# Patient Record
Sex: Male | Born: 1965
Health system: Southern US, Community
[De-identification: ages and names within clinical notes are randomized; demographics above are authoritative.]

## PROBLEM LIST (undated history)

## (undated) DIAGNOSIS — I2510 Atherosclerotic heart disease of native coronary artery without angina pectoris: Secondary | ICD-10-CM

## (undated) DIAGNOSIS — F1920 Other psychoactive substance dependence, uncomplicated: Secondary | ICD-10-CM

## (undated) DIAGNOSIS — F32A Depression, unspecified: Secondary | ICD-10-CM

## (undated) DIAGNOSIS — I10 Essential (primary) hypertension: Secondary | ICD-10-CM

## (undated) DIAGNOSIS — K649 Unspecified hemorrhoids: Secondary | ICD-10-CM

## (undated) DIAGNOSIS — N182 Chronic kidney disease, stage 2 (mild): Secondary | ICD-10-CM

## (undated) DIAGNOSIS — I77810 Thoracic aortic ectasia: Secondary | ICD-10-CM

## (undated) DIAGNOSIS — F1921 Other psychoactive substance dependence, in remission: Secondary | ICD-10-CM

## (undated) DIAGNOSIS — G9050 Complex regional pain syndrome I, unspecified: Secondary | ICD-10-CM

## (undated) DIAGNOSIS — Z87442 Personal history of urinary calculi: Secondary | ICD-10-CM

## (undated) DIAGNOSIS — F1011 Alcohol abuse, in remission: Secondary | ICD-10-CM

## (undated) DIAGNOSIS — G8929 Other chronic pain: Secondary | ICD-10-CM

## (undated) DIAGNOSIS — M545 Low back pain, unspecified: Secondary | ICD-10-CM

## (undated) DIAGNOSIS — G4733 Obstructive sleep apnea (adult) (pediatric): Secondary | ICD-10-CM

## (undated) DIAGNOSIS — M1990 Unspecified osteoarthritis, unspecified site: Secondary | ICD-10-CM

## (undated) DIAGNOSIS — Z9989 Dependence on other enabling machines and devices: Secondary | ICD-10-CM

## (undated) DIAGNOSIS — F1411 Cocaine abuse, in remission: Secondary | ICD-10-CM

## (undated) DIAGNOSIS — K219 Gastro-esophageal reflux disease without esophagitis: Secondary | ICD-10-CM

## (undated) DIAGNOSIS — N1831 Chronic kidney disease, stage 3a: Secondary | ICD-10-CM

## (undated) DIAGNOSIS — J449 Chronic obstructive pulmonary disease, unspecified: Secondary | ICD-10-CM

## (undated) DIAGNOSIS — I340 Nonrheumatic mitral (valve) insufficiency: Secondary | ICD-10-CM

## (undated) DIAGNOSIS — F329 Major depressive disorder, single episode, unspecified: Secondary | ICD-10-CM

## (undated) DIAGNOSIS — Z8614 Personal history of Methicillin resistant Staphylococcus aureus infection: Secondary | ICD-10-CM

## (undated) DIAGNOSIS — N179 Acute kidney failure, unspecified: Secondary | ICD-10-CM

## (undated) HISTORY — PX: LACERATION REPAIR: SHX5168

## (undated) HISTORY — DX: Unspecified hemorrhoids: K64.9

## (undated) HISTORY — DX: Chronic kidney disease, stage 2 (mild): N18.2

## (undated) HISTORY — DX: Alcohol abuse, in remission: F10.11

## (undated) HISTORY — PX: INCISION AND DRAINAGE OF WOUND: SHX1803

## (undated) HISTORY — PX: CYSTOSCOPY: SUR368

## (undated) HISTORY — PX: OTHER SURGICAL HISTORY: SHX169

## (undated) HISTORY — PX: FRACTURE SURGERY: SHX138

## (undated) HISTORY — DX: Other psychoactive substance dependence, uncomplicated: F19.20

## (undated) HISTORY — PX: WRIST FRACTURE SURGERY: SHX121

## (undated) HISTORY — DX: Cocaine abuse, in remission: F14.11

## (undated) HISTORY — DX: Other psychoactive substance dependence, in remission: F19.21

---
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
## 1985-07-18 HISTORY — PX: NASAL RECONSTRUCTION: SHX2069

## 1990-07-18 HISTORY — PX: HAND SURGERY: SHX662

## 1998-03-17 ENCOUNTER — Emergency Department (HOSPITAL_COMMUNITY): Admission: EM | Admit: 1998-03-17 | Discharge: 1998-03-17 | Payer: Self-pay | Admitting: Emergency Medicine

## 1998-03-20 ENCOUNTER — Ambulatory Visit (HOSPITAL_BASED_OUTPATIENT_CLINIC_OR_DEPARTMENT_OTHER): Admission: RE | Admit: 1998-03-20 | Discharge: 1998-03-20 | Payer: Self-pay | Admitting: Orthopaedic Surgery

## 1998-07-18 HISTORY — PX: INGUINAL HERNIA REPAIR: SUR1180

## 2000-05-15 ENCOUNTER — Emergency Department (HOSPITAL_COMMUNITY): Admission: EM | Admit: 2000-05-15 | Discharge: 2000-05-15 | Payer: Self-pay | Admitting: Emergency Medicine

## 2001-03-13 ENCOUNTER — Emergency Department (HOSPITAL_COMMUNITY): Admission: EM | Admit: 2001-03-13 | Discharge: 2001-03-13 | Payer: Self-pay | Admitting: *Deleted

## 2006-02-21 ENCOUNTER — Emergency Department (HOSPITAL_COMMUNITY): Admission: EM | Admit: 2006-02-21 | Discharge: 2006-02-21 | Payer: Self-pay | Admitting: Family Medicine

## 2008-11-11 ENCOUNTER — Ambulatory Visit: Payer: Self-pay | Admitting: Family Medicine

## 2008-11-11 DIAGNOSIS — B356 Tinea cruris: Secondary | ICD-10-CM | POA: Insufficient documentation

## 2008-11-11 DIAGNOSIS — F331 Major depressive disorder, recurrent, moderate: Secondary | ICD-10-CM | POA: Insufficient documentation

## 2008-11-11 DIAGNOSIS — K644 Residual hemorrhoidal skin tags: Secondary | ICD-10-CM | POA: Insufficient documentation

## 2009-03-19 ENCOUNTER — Encounter (INDEPENDENT_AMBULATORY_CARE_PROVIDER_SITE_OTHER): Payer: Self-pay | Admitting: Internal Medicine

## 2009-03-20 ENCOUNTER — Ambulatory Visit: Payer: Self-pay | Admitting: Family Medicine

## 2009-03-20 DIAGNOSIS — K2100 Gastro-esophageal reflux disease with esophagitis, without bleeding: Secondary | ICD-10-CM | POA: Insufficient documentation

## 2009-03-20 DIAGNOSIS — R079 Chest pain, unspecified: Secondary | ICD-10-CM | POA: Insufficient documentation

## 2009-03-24 ENCOUNTER — Encounter (INDEPENDENT_AMBULATORY_CARE_PROVIDER_SITE_OTHER): Payer: Self-pay | Admitting: Internal Medicine

## 2009-03-27 ENCOUNTER — Ambulatory Visit: Payer: Self-pay | Admitting: Family Medicine

## 2009-03-31 LAB — CONVERTED CEMR LAB
ALT: 27 units/L (ref 0–53)
AST: 22 units/L (ref 0–37)
Albumin: 3.8 g/dL (ref 3.5–5.2)
Alkaline Phosphatase: 94 units/L (ref 39–117)
BUN: 17 mg/dL (ref 6–23)
Basophils Absolute: 0 10*3/uL (ref 0.0–0.1)
Basophils Relative: 0.1 % (ref 0.0–3.0)
Bilirubin, Direct: 0 mg/dL (ref 0.0–0.3)
CO2: 29 meq/L (ref 19–32)
Calcium: 8.8 mg/dL (ref 8.4–10.5)
Chloride: 111 meq/L (ref 96–112)
Cholesterol: 190 mg/dL (ref 0–200)
Creatinine, Ser: 1.2 mg/dL (ref 0.4–1.5)
Eosinophils Absolute: 0.4 10*3/uL (ref 0.0–0.7)
Eosinophils Relative: 4.9 % (ref 0.0–5.0)
GFR calc non Af Amer: 70.12 mL/min (ref >60)
Glucose, Bld: 87 mg/dL (ref 70–99)
HCT: 44.4 % (ref 39.0–52.0)
HDL: 31 mg/dL — ABNORMAL LOW (ref >39.00)
Hemoglobin: 14.7 g/dL (ref 13.0–17.0)
LDL Cholesterol: 142 mg/dL — ABNORMAL HIGH (ref 0–99)
Lymphocytes Relative: 31.7 % (ref 12.0–46.0)
Lymphs Abs: 2.3 10*3/uL (ref 0.7–4.0)
MCHC: 33.1 g/dL (ref 30.0–36.0)
MCV: 92.2 fL (ref 78.0–100.0)
Monocytes Absolute: 0.6 10*3/uL (ref 0.1–1.0)
Monocytes Relative: 8.6 % (ref 3.0–12.0)
Neutro Abs: 3.9 10*3/uL (ref 1.4–7.7)
Neutrophils Relative %: 54.7 % (ref 43.0–77.0)
Platelets: 189 10*3/uL (ref 150.0–400.0)
Potassium: 3.9 meq/L (ref 3.5–5.1)
RBC: 4.82 M/uL (ref 4.22–5.81)
RDW: 13 % (ref 11.5–14.6)
Sodium: 143 meq/L (ref 135–145)
TSH: 2.59 microintl units/mL (ref 0.35–5.50)
Total Bilirubin: 0.7 mg/dL (ref 0.3–1.2)
Total CHOL/HDL Ratio: 6
Total Protein: 6.6 g/dL (ref 6.0–8.3)
Triglycerides: 83 mg/dL (ref 0.0–149.0)
VLDL: 16.6 mg/dL (ref 0.0–40.0)
WBC: 7.2 10*3/uL (ref 4.5–10.5)

## 2009-12-24 ENCOUNTER — Ambulatory Visit: Payer: Self-pay | Admitting: Family Medicine

## 2009-12-24 DIAGNOSIS — R21 Rash and other nonspecific skin eruption: Secondary | ICD-10-CM | POA: Insufficient documentation

## 2010-02-02 ENCOUNTER — Telehealth: Payer: Self-pay | Admitting: Family Medicine

## 2010-02-22 ENCOUNTER — Emergency Department (HOSPITAL_COMMUNITY): Admission: EM | Admit: 2010-02-22 | Discharge: 2010-02-22 | Payer: Self-pay | Admitting: Emergency Medicine

## 2010-02-24 ENCOUNTER — Inpatient Hospital Stay (HOSPITAL_COMMUNITY): Admission: EM | Admit: 2010-02-24 | Discharge: 2010-02-26 | Payer: Self-pay | Admitting: Emergency Medicine

## 2010-03-19 ENCOUNTER — Ambulatory Visit: Payer: Self-pay | Admitting: Family Medicine

## 2010-03-19 DIAGNOSIS — IMO0002 Reserved for concepts with insufficient information to code with codable children: Secondary | ICD-10-CM | POA: Insufficient documentation

## 2010-08-19 NOTE — Assessment & Plan Note (Signed)
Summary: BP ELEVATED   Vital Signs:  Patient profile:   45 year old male Height:      72 inches Weight:      199.75 pounds BMI:     27.19 Temp:     98.2 degrees F oral Pulse rate:   80 / minute Pulse rhythm:   regular Resp:     20 per minute BP sitting:   120 / 80  (left arm) Cuff size:   regular  Vitals Entered By: Lewanda Rife LPN (March 20, 2009 11:59 AM)  CC:  BP elevated, numbness in entire face, and no chest pain now but yesterday had crushing type chest pain with sweating.  History of Present Illness: Here for elevated BP and crushing chest pain yesterday with and a previous episode earlier this week --first episode at lunch--chest pain, sweating, no nausea/no vomiting, sweating, numbness in face, no radiation of pain--stood in front of A/C--lasted  ~1hr, afterwards was tired, took nothing --second episode yesterday with picking up trash around shop--sudden onset of chest pain--sharpe stabbing both times, sweated, no N/V, face numb and tingling wihtr feeling of swollen lips, BP up to 138/99 --numbness in entire face/lips --no hx of HBP, no recent medical care  Takes Prilosec 20 qam for 3 yrs--with food  Had tetnus 3 yrs ago at [address]--due to injury  Preventive Screening-Counseling & Management  Alcohol-Tobacco     Smoking Status: current     Packs/Day: 1.0     Year Started: 1984     Cigars/week: 0     Pipe use/week: 0     Cans of tobacco/week: 0  Caffeine-Diet-Exercise     Caffeine use/day: ++++     Does Patient Exercise: no  Problems Prior to Update: 1)  Hemorrhoids, External  (ICD-455.3) 2)  Tinea Cruris  (ICD-110.3) 3)  Depression  (ICD-311) 4)  Alcohol or Drug Problem  () 5)  High Blood Pressure Reading  () 6)  Arthritis  ()  Medications Prior to Update: 1)  Prilosec Otc 20 Mg Tbec (Omeprazole Magnesium) .... Once A Day 2)  Multivitamins  Caps (Multiple Vitamin) .... Once A Day 3)  Anusol-Hc 25 Mg Supp (Hydrocortisone Acetate) .... Apply To  Rectum As Needed (Generic Ok) 4)  Fluconazole 150 Mg Tabs (Fluconazole) .Marland Kitchen.. 1 By Mouth Once A Week  Allergies: 1)  ! Penicillin 2)  ! * Seafood  Past History:  Family History: Last updated: 11/11/2008 Alcoholism, Drug Addiction: GP Colon CA: 0 Ovarian/Uterine CA: GM Breast CA: other Lung CA: other Prostate CA: 0 CAD: + Father, CAD, Angioplasty x 4, CABG CVA: Father, multiple Sudden death < 50: 0 DM: 0 Mental Illness: 0  Family History High cholesterol Family History Hypertension  Social History: Last updated: 11/11/2008 Marital Status: Divorced, single now Children: 0 Occupation: IT trainer Current Smoker Alcohol use-no (Recovering Drug and ETOH addict) In [place] and AA. Drug use-no Regular exercise-no  Risk Factors: Caffeine Use: ++++ (03/20/2009) Exercise: no (03/20/2009)  Risk Factors: Smoking Status: current (03/20/2009) Packs/Day: 1.0 (03/20/2009) Cigars/wk: 0 (03/20/2009) Pipe Use/wk: 0 (03/20/2009) Cans of tobacco/wk: 0 (03/20/2009) Passive Smoke Exposure: yes (11/11/2008)  Past Medical History: Reviewed history from 11/11/2008 and no changes required. * ALCOHOL OR DRUG PROBLEM (ALCOHOL AND COCAINE) * HIGH BLOOD PRESSURE READING Depression, resolved Hemorrhoids  NO CONTROLLED SUBSTANCES PER DR. Patsy Lager, 11/11/2008  Past Surgical History: Reviewed history from 11/11/2008 and no changes required. Hand Surgery 1992 Foot 1986 Tubes in ears 1983 Nose 1987 Hernia 2000  Social History: Caffeine use/day:  ++++  Review of Systems General:  Complains of sweats; denies chills, fatigue, and fever; swea at time of episode only. CV:  Complains of chest pain or discomfort and shortness of breath with exertion; denies palpitations, swelling of feet, and swelling of hands. Resp:  Denies cough, shortness of breath, and wheezing. GI:  Complains of indigestion; denies abdominal pain, nausea, and vomiting; see HPI. Psych:  Complains of anxiety; new with the  episodes.  Physical Exam  General:  alert, well-developed, well-nourished, and well-hydrated.  NAD Neck:  no masses, no JVD, and no carotid bruits.   Lungs:  normal respiratory effort, no intercostal retractions, no accessory muscle use, and normal breath sounds.   Heart:  normal rate, regular rhythm, and no murmur.  EKG--NSR Abdomen:  soft, non-tender, normal bowel sounds, no distention, no masses, no guarding, no abdominal hernia, no inguinal hernia, no hepatomegaly, and no splenomegaly.   Extremities:  no edema either lower leg Neurologic:  alert & oriented X3 and gait normal.   Psych:  normally interactive and good eye contact.     Impression & Recommendations:  Problem # 1:  CHEST PAIN (ICD-786.50) Assessment New  doubt is cardiac in origin as has nl EKG several days post 2 events eats high fat diet--fried, gravey  Orders: 12 Lead EKG (12 Lead EKG)  Problem # 2:  ESOPHAGITIS, REFLUX (ICD-530.11) Assessment: Deteriorated discussed how to take Prilosec, ie first thing each morning, wait 30-60 min to eat or drink more follow--see back in 2 wks if not improved, will refer to GI His updated medication list for this problem includes:    Prilosec Otc 20 Mg Tbec (Omeprazole magnesium) ..... Once a day  Problem # 3:  PREVENTIVE HEALTH CARE (ICD-V70.0) Assessment: Comment Only will schedule in for fasting lipids and baseline labs--notify get date of last tetnus   Problem # 4:  * HIGH BLOOD PRESSURE READING Assessment: Unchanged stable at this time--follow  Complete Medication List: 1)  Prilosec Otc 20 Mg Tbec (Omeprazole magnesium) .... Once a day 2)  Multivitamins Caps (Multiple vitamin) .... Once a day  Patient Instructions: 1)  schedule in for fasting lipids, and Seffner panel 2)  Please schedule a follow-up appointment in 2 weeks--see me.  Current Allergies (reviewed today): ! PENICILLIN ! * SEAFOOD

## 2010-08-19 NOTE — Miscellaneous (Signed)
Summary: tdap update  Clinical Lists Changes  Observations: Added new observation of TD BOOSTER: Tdap (10/22/2006 8:08)      Immunization History:  Tetanus/Td Immunization History:    Tetanus/Td:  tdap (10/22/2006)

## 2010-08-19 NOTE — Assessment & Plan Note (Signed)
Summary: CHECK BUMP ON ARM   Vital Signs:  Patient profile:   45 year old male Height:      72 inches Weight:      190.0 pounds BMI:     25.86 Temp:     98.1 degrees F oral Pulse rate:   80 / minute Pulse rhythm:   regular BP sitting:   124 / 84  (left arm) Cuff size:   regular  Vitals Entered By: Benny Lennert CMA Duncan Dull) (March 19, 2010 12:23 PM)  History of Present Illness: Chief complaint Check bump on arm  Seen 12/2009 He is a Visual merchandiser, works outside all day with pesticides. Noticed a small bump on left arm over a week ago, next morning, multiple painful bumps. Now diffuse surrounding erythema, pain, itching, warmth. Feels a little feverish but no fevers.  No nausea, vomiting or other systemic symptoms. A/P Etiology unknown.  Contact dermatitis vs insect bite vs shingles, now superinfected. Treated with doxycyline at that time.  Also saw Dr. Toni Arthurs ..given sulfa drug  Has also had previous episode..Pt already notified per report.  MRSA but no culture was done.   Over past 4-5 month.. he has had skin lesions appears as red bump/scab with swelling/redness.  Notes lesions come and go every 2 weeks. Occ drainage of yellow and blood. Several on bottom and arms.  Tender, not itchy.   Pt and mother request culture today.    No fever.  No family members with boils.  Problems Prior to Update: 1)  Skin Rash  (ICD-782.1) 2)  Preventive Health Care  (ICD-V70.0) 3)  Esophagitis, Reflux  (ICD-530.11) 4)  Chest Pain  (ICD-786.50) 5)  Hemorrhoids, External  (ICD-455.3) 6)  Tinea Cruris  (ICD-110.3) 7)  Depression  (ICD-311) 8)  Alcohol or Drug Problem  () 9)  High Blood Pressure Reading  () 10)  Arthritis  ()  Current Medications (verified): 1)  Prilosec Otc 20 Mg Tbec (Omeprazole Magnesium) .... Once A Day 2)  Multivitamins  Caps (Multiple Vitamin) .... Once A Day  Allergies: 1)  ! Penicillin 2)  ! * Seafood  Past History:  Past medical, surgical, family and  social histories (including risk factors) reviewed, and no changes noted (except as noted below).  Past Medical History: Reviewed history from 11/11/2008 and no changes required. * ALCOHOL OR DRUG PROBLEM (ALCOHOL AND COCAINE) * HIGH BLOOD PRESSURE READING Depression, resolved Hemorrhoids  NO CONTROLLED SUBSTANCES PER DR. Patsy Lager, 11/11/2008  Past Surgical History: Reviewed history from 11/11/2008 and no changes required. Hand Surgery 1992 Foot 1986 Tubes in ears 1983 Nose 1987 Hernia 2000  Family History: Reviewed history from 11/11/2008 and no changes required. Alcoholism, Drug Addiction: GP Colon CA: 0 Ovarian/Uterine CA: GM Breast CA: other Lung CA: other Prostate CA: 0 CAD: + Father, CAD, Angioplasty x 4, CABG CVA: Father, multiple Sudden death < 50: 0 DM: 0 Mental Illness: 0  Family History High cholesterol Family History Hypertension  Social History: Reviewed history from 11/11/2008 and no changes required. Marital Status: Divorced, single now Children: 0 Occupation: IT trainer Current Smoker Alcohol use-no (Recovering Drug and ETOH addict) In [place] and AA. Drug use-no Regular exercise-no  Review of Systems General:  Denies fatigue and fever. CV:  Denies chest pain or discomfort. Resp:  Denies shortness of breath.  Physical Exam  General:  Well-developed,well-nourished,in no acute distress; alert,appropriate and cooperative throughout examination Mouth:  MMM, no oral lesions Lungs:  Normal respiratory effort, chest expands symmetrically. Lungs are clear  to auscultation, no crackles or wheezes. Heart:  Normal rate and regular rhythm. S1 and S2 normal without gallop, murmur, click, rub or other extra sounds. Skin:  right forarm with black 3mm scab with 2 inches of surrounding eryhtema and induration.  No fluctuance.  Cervical Nodes:  No lymphadenopathy noted Axillary Nodes:  No palpable lymphadenopathy, left axillae   Impression &  Recommendations:  Problem # 1:  CELLULITIS AND ABSCESS OF UPPER ARM AND FOREARM (ICD-682.3) Area cleaned with alcohol. 2 % lidocaine with epi used for anesthesia. 11 blade scalpel used to inscise area..no pus only blood extruded. No packing needed. Pressure bandage applied.   His updated medication list for this problem includes:    Doxycycline Monohydrate 100 Mg Caps (Doxycycline monohydrate) .Marland Kitchen... 1 tab by mouth two times a day x 10 days  Orders: T-Culture, Wound ([id number]) I&D Abscess, Simple / Single (10060)  Problem # 2:  SKIN RASH (ICD-782.1) Rash appears most consistennt with recurrent infection/abcess..send for culture. Discussed prevention and cleansing of househlod .   Follow up in 2 weeks if not resolving, call sooner if fever or redness spreading.  The following medications were removed from the medication list:    Fluocinonide 0.05 % Crea (Fluocinonide) .Marland Kitchen... Apply to area two times a day  Complete Medication List: 1)  Prilosec Otc 20 Mg Tbec (Omeprazole magnesium) .... Once a day 2)  Multivitamins Caps (Multiple vitamin) .... Once a day 3)  Doxycycline Monohydrate 100 Mg Caps (Doxycycline monohydrate) .Marland Kitchen.. 1 tab by mouth two times a day x 10 days  Patient Instructions: 1)  Keep area clean and dry..wear bandage, change daily with neosporin. 2)  Will call with culture results. 3)  Wash all clothes in hot water.  4)  Bleach clean bathroom and personal items or replace once lesion is gone. 5)  Start antibacterial soap like Dial or Lever 2000.  Prescriptions: DOXYCYCLINE MONOHYDRATE 100 MG CAPS (DOXYCYCLINE MONOHYDRATE) 1 tab by mouth two times a day x 10 days  #20 x 0   Entered and Authorized by:   Kerby Nora MD   Signed by:   Kerby Nora MD on 03/19/2010   Method used:   Electronically to        [facility]* (retail)       [address]       [place], [place]  [address]       Ph: [phone]       Fax: [phone]   RxID:   [id number]   Current  Allergies (reviewed today): ! PENICILLIN ! * SEAFOOD  Appended Document: CHECK BUMP ON ARM

## 2010-08-19 NOTE — Progress Notes (Signed)
Summary: rash on arms  Phone Note Call from Patient Call back at Home Phone [phone]   Caller: Patient Call For: Dr. Dayton Martes Summary of Call: Patient says that he has the same rash as before when he was seen on 12-24-09. He now has it on both arms. The rash had gone away for alittle whie, but it is now back. He says that he doesn't have insurance and can't afford to come in for office visit, but is asking if he could get a refill on the doxycyline. Uses [facility].  Initial call taken by: Melody Comas,  February 02, 2010 1:53 PM  Follow-up for Phone Call        It is not our practice to give abx without seeing pt.  I don't know if he has a fever, what it looks like, if it itches.  He truly needs to be seen.  You can ask primary MD as he may feel differently but I think its very dangerous to give abx without being seen. Ruthe Mannan MD  February 03, 2010 7:38 AM  Called patient was advised that he would need appt. He said he needs to think about it and will call back if he wants appt. Follow-up by: Melody Comas,  February 03, 2010 8:46 AM

## 2010-08-19 NOTE — Assessment & Plan Note (Signed)
Summary: SORE ON RIGHT ARM   Vital Signs:  Patient profile:   45 year old male Height:      72 inches Weight:      192.25 pounds BMI:     26.17 Temp:     98.0 degrees F oral Pulse rate:   80 / minute Pulse rhythm:   regular BP sitting:   140 / 100  (left arm) Cuff size:   regular  Vitals Entered By: Linde Gillis CMA Duncan Dull) (December 24, 2009 11:57 AM) CC: sore on right arm   History of Present Illness: 45 yo here with rash on right arm.  He is a Visual merchandiser, works outside all day with pesticides. Noticed a small bump on left arm over a week ago, next morning, multiple painful bumps. Now diffuse surrounding erythema, pain, itching, warmth.  Feels a little feverish but no fevers.  No nausea, vomiting or other systemic symptoms.  Current Medications (verified): 1)  Prilosec Otc 20 Mg Tbec (Omeprazole Magnesium) .... Once A Day 2)  Multivitamins  Caps (Multiple Vitamin) .... Once A Day 3)  Doxycycline Hyclate 100 Mg Caps (Doxycycline Hyclate) .... Take 1 Tab Twice A Day X 10 Days 4)  Fluocinonide 0.05 % Crea (Fluocinonide) .... Apply To Area Two Times A Day  Allergies: 1)  ! Penicillin 2)  ! * Seafood  Review of Systems      See HPI General:  Complains of chills; denies fever, loss of appetite, and malaise. GI:  Denies diarrhea, nausea, and vomiting.  Physical Exam  General:  Well-developed,well-nourished,in no acute distress; alert,appropriate and cooperative throughout examination non toxic appearing Skin:  large area of erythema on right forearm, multiple open, scabbed vesiblce, warm and painful to touch. Psych:  Cognition and judgment appear intact. Alert and cooperative with normal attention span and concentration. No apparent delusions, illusions, hallucinations   Impression & Recommendations:  Problem # 1:  SKIN RASH (ICD-782.1) Assessment New Etiology unknown.  Contact dermatitis vs insect bite vs shingles, now superinfected. Outside window for Valtrex lower on  differential. Will treat with Doxycycline 100 mg two times a day x 10 days. Lidex if helps, he advsied to not use if not helpful. Follow up with me on Monday if no improvement of symptoms. His updated medication list for this problem includes:    Fluocinonide 0.05 % Crea (Fluocinonide) .Marland Kitchen... Apply to area two times a day  Complete Medication List: 1)  Prilosec Otc 20 Mg Tbec (Omeprazole magnesium) .... Once a day 2)  Multivitamins Caps (Multiple vitamin) .... Once a day 3)  Doxycycline Hyclate 100 Mg Caps (Doxycycline hyclate) .... Take 1 tab twice a day x 10 days 4)  Fluocinonide 0.05 % Crea (Fluocinonide) .... Apply to area two times a day Prescriptions: FLUOCINONIDE 0.05 % CREA (FLUOCINONIDE) apply to area two times a day  #15 g x 0   Entered and Authorized by:   Ruthe Mannan MD   Signed by:   Ruthe Mannan MD on 12/24/2009   Method used:   Electronically to        [facility]* (retail)       [address]       [place], [place]  [address]       Ph: [phone]       Fax: [phone]   RxID:   [id number] DOXYCYCLINE HYCLATE 100 MG CAPS (DOXYCYCLINE HYCLATE) Take 1 tab twice a day x 10 days  #20 x 0   Entered and Authorized by:  Ruthe Mannan MD   Signed by:   Ruthe Mannan MD on 12/24/2009   Method used:   Electronically to        [facility]* (retail)       [address]       [place], [place]  [address]       Ph: [phone]       Fax: [phone]   RxID:   [id number]   Current Allergies (reviewed today): ! PENICILLIN ! * SEAFOOD

## 2010-08-19 NOTE — Assessment & Plan Note (Signed)
Summary: NEW PT TO EST/CLE   Vital Signs:  Patient profile:   45 year old male Height:      72 inches Weight:      188.6 pounds BMI:     25.67 Temp:     97.6 degrees F oral Pulse rate:   78 / minute Pulse rhythm:   regular BP sitting:   120 / 90  (left arm) Cuff size:   regular  Vitals Entered By: Benny Lennert CMA (November 11, 2008 11:05 AM)  History of Present Illness: Chief complaint new patient to be established   45 year old male here to establish care:  1. Works on farm, lots of animals and things like that. Has itching in the crease of his legs and around there. Bad itching until raw - had some knots that hurt like fire and could not get out of bed. Tried many creams and sprays. He actually tried Lotrimin recently, and this helped a lot with his groin rash.  Also has hemorrhoids. Prep H did help.  No bleeding, but has had this sometimes.  Has a known large hemorrhoid.  Tired when he wakes up. Some nights more than others  Preventive Screening-Counseling & Management     Smoking Status: current     Packs/Day: 1.0     Passive Smoke Exposure: yes     Caffeine use/day: yes     Does Patient Exercise: no     Seat Belt Use: yes      Drug Use:  former, intranasal cocaine, marijuana, and other.    Allergies (verified): 1)  ! * Seafood  Past History:  Past Medical History:    * ALCOHOL OR DRUG PROBLEM (ALCOHOL AND COCAINE)    * HIGH BLOOD PRESSURE READING    Depression, resolved    Hemorrhoids        NO CONTROLLED SUBSTANCES PER DR. Patsy Lager, 11/11/2008  Past Surgical History:    Hand Surgery 1992    Foot 1986    Tubes in ears 1983    Nose 1987    Hernia 2000  Family History:    Alcoholism, Drug Addiction: GP    Colon CA: 0    Ovarian/Uterine CA: GM    Breast CA: other    Lung CA: other    Prostate CA: 0    CAD: + Father, CAD, Angioplasty x 4, CABG    CVA: Father, multiple    Sudden death < 50: 0    DM: 0    Mental Illness: 0     Family History High  cholesterol    Family History Hypertension  Social History:    Marital Status: Divorced, single now    Children: 0    Occupation: IT trainer    Current Smoker    Alcohol use-no (Recovering Drug and ETOH addict)    In [place] and AA.    Drug use-no    Regular exercise-no    Ethnicity:  White    Occupation:  employed    Education:  9-12 yrs    Risk analyst Use:  yes    Alcohol:  None    Caffeine use/day:  yes    Smoking Status:  current    Packs/Day:  1.0    Passive Smoke Exposure:  yes    Drug Use:  former, intranasal cocaine, marijuana, other    Does Patient Exercise:  no  Review of Systems      See HPI General:  Denies chills, fever, and  sweats. GI:  Complains of bloody stools; denies abdominal pain, change in bowel habits, constipation, dark tarry stools, diarrhea, excessive appetite, gas, hemorrhoids, indigestion, loss of appetite, nausea, vomiting, vomiting blood, and yellowish skin color. Derm:  Complains of changes in color of skin, dryness, excessive perspiration, itching, and lesion(s); denies changes in nail beds, flushing, hair loss, insect bite(s), poor wound healing, and rash.  Physical Exam  General:  Well-developed,well-nourished,in no acute distress; alert,appropriate and cooperative throughout examination Head:  Normocephalic and atraumatic without obvious abnormalities. No apparent alopecia or balding. Ears:  External ear exam shows no significant lesions or deformities.  Otoscopic examination reveals clear canals, tympanic membranes are intact bilaterally without bulging, retraction, inflammation or discharge. Hearing is grossly normal bilaterally. Nose:  no external deformity.   Mouth:  Oral mucosa and oropharynx without lesions or exudates.  Teeth in good repair. Neck:  No deformities, masses, or tenderness noted. Lungs:  Normal respiratory effort, chest expands symmetrically. Lungs are clear to auscultation, no crackles or wheezes. Heart:  Normal rate and regular  rhythm. S1 and S2 normal without gallop, murmur, click, rub or other extra sounds. Abdomen:  Bowel sounds positive,abdomen soft and non-tender without masses, organomegaly or hernias noted. Rectal:  Large external hemorrhoids, normal sphincter tone, no masses, no tenderness, no fissures, no fistulae, and no perianal rash.   Genitalia:  no hydrocele, no varicocele, no scrotal masses, and no testicular masses or atrophy.   Groin rash, macular, reddish, evidence of excoriation. Prostate:  Prostate gland firm and smooth, no enlargement, nodularity, tenderness, mass, asymmetry or induration. Extremities:  No clubbing, cyanosis, edema, or deformity noted with normal full range of motion of all joints.   Neurologic:  alert & oriented X3 and gait normal.   Cervical Nodes:  No lymphadenopathy noted Inguinal Nodes:  No significant adenopathy Psych:  Cognition and judgment appear intact. Alert and cooperative with normal attention span and concentration. No apparent delusions, illusions, hallucinations   Impression & Recommendations:  Problem # 1:  TINEA CRURIS (ICD-110.3) Assessment New Will treat with topical Lotrimin and Diflucan 150 mg, once a week for 3 weeks  Bad jock itch  Problem # 2:  HEMORRHOIDS, EXTERNAL (ICD-455.3) Assessment: New Prep H, Tucks, Anusol as needed  No finances - looks like would be a good candidate for a hemorrhoidectomy  Complete Medication List: 1)  Prilosec Otc 20 Mg Tbec (Omeprazole magnesium) .... Once a day 2)  Multivitamins Caps (Multiple vitamin) .... Once a day 3)  Anusol-hc 25 Mg Supp (Hydrocortisone acetate) .... Apply to rectum as needed (generic ok) 4)  Fluconazole 150 Mg Tabs (Fluconazole) .Marland Kitchen.. 1 by mouth once a week  Patient Instructions: 1)  Lotrimin over the counter Prescriptions: FLUCONAZOLE 150 MG TABS (FLUCONAZOLE) 1 by mouth once a week  #3 x 0   Entered and Authorized by:   Hannah Beat MD   Signed by:   Hannah Beat MD on 11/11/2008    Method used:   Print then Give to Patient   RxID:   [id number] ANUSOL-HC 25 MG SUPP (HYDROCORTISONE ACETATE) apply to rectum as needed (generic OK)  #30 x 1   Entered and Authorized by:   Hannah Beat MD   Signed by:   Hannah Beat MD on 11/11/2008   Method used:   Print then Give to Patient   RxID:   [id number]   Current Allergies (reviewed today): ! * SEAFOOD

## 2010-10-01 LAB — PROTEIN ELECTROPH W RFLX QUANT IMMUNOGLOBULINS
Albumin ELP: 58.9 % (ref 55.8–66.1)
Alpha-1-Globulin: 8 % — ABNORMAL HIGH (ref 2.9–4.9)
Alpha-2-Globulin: 10.9 % (ref 7.1–11.8)
Beta 2: 4.4 % (ref 3.2–6.5)
Beta Globulin: 5 % (ref 4.7–7.2)
Gamma Globulin: 12.8 % (ref 11.1–18.8)
M-Spike, %: NOT DETECTED g/dL
Total Protein ELP: 6 g/dL (ref 6.0–8.3)

## 2010-10-01 LAB — POCT I-STAT, CHEM 8
BUN: 19 mg/dL (ref 6–23)
Calcium, Ion: 1.06 mmol/L — ABNORMAL LOW (ref 1.12–1.32)
Chloride: 109 mEq/L (ref 96–112)
Creatinine, Ser: 2.6 mg/dL — ABNORMAL HIGH (ref 0.4–1.5)
Glucose, Bld: 121 mg/dL — ABNORMAL HIGH (ref 70–99)
HCT: 46 % (ref 39.0–52.0)
Hemoglobin: 15.6 g/dL (ref 13.0–17.0)
Potassium: 4.1 mEq/L (ref 3.5–5.1)
Sodium: 142 mEq/L (ref 135–145)
TCO2: 24 mmol/L (ref 0–100)

## 2010-10-01 LAB — URINE MICROSCOPIC-ADD ON

## 2010-10-01 LAB — URINALYSIS, ROUTINE W REFLEX MICROSCOPIC
Bilirubin Urine: NEGATIVE
Bilirubin Urine: NEGATIVE
Bilirubin Urine: NEGATIVE
Glucose, UA: NEGATIVE mg/dL
Glucose, UA: NEGATIVE mg/dL
Glucose, UA: NEGATIVE mg/dL
Hgb urine dipstick: NEGATIVE
Hgb urine dipstick: NEGATIVE
Ketones, ur: NEGATIVE mg/dL
Ketones, ur: NEGATIVE mg/dL
Ketones, ur: NEGATIVE mg/dL
Leukocytes, UA: NEGATIVE
Nitrite: NEGATIVE
Nitrite: NEGATIVE
Nitrite: NEGATIVE
Protein, ur: NEGATIVE mg/dL
Protein, ur: NEGATIVE mg/dL
Protein, ur: NEGATIVE mg/dL
Specific Gravity, Urine: 1.015 (ref 1.005–1.030)
Specific Gravity, Urine: 1.015 (ref 1.005–1.030)
Specific Gravity, Urine: 1.027 (ref 1.005–1.030)
Urobilinogen, UA: 0.2 mg/dL (ref 0.0–1.0)
Urobilinogen, UA: 0.2 mg/dL (ref 0.0–1.0)
Urobilinogen, UA: 0.2 mg/dL (ref 0.0–1.0)
pH: 5 (ref 5.0–8.0)
pH: 5 (ref 5.0–8.0)
pH: 5.5 (ref 5.0–8.0)

## 2010-10-01 LAB — DIFFERENTIAL
Basophils Absolute: 0 10*3/uL (ref 0.0–0.1)
Basophils Relative: 0 % (ref 0–1)
Eosinophils Absolute: 0.6 10*3/uL (ref 0.0–0.7)
Eosinophils Relative: 6 % — ABNORMAL HIGH (ref 0–5)
Lymphocytes Relative: 14 % (ref 12–46)
Lymphs Abs: 1.5 10*3/uL (ref 0.7–4.0)
Monocytes Absolute: 0.7 10*3/uL (ref 0.1–1.0)
Monocytes Relative: 7 % (ref 3–12)
Neutro Abs: 7.8 10*3/uL — ABNORMAL HIGH (ref 1.7–7.7)
Neutrophils Relative %: 73 % (ref 43–77)

## 2010-10-01 LAB — CBC
HCT: 37.1 % — ABNORMAL LOW (ref 39.0–52.0)
HCT: 43.7 % (ref 39.0–52.0)
Hemoglobin: 12.7 g/dL — ABNORMAL LOW (ref 13.0–17.0)
Hemoglobin: 15.1 g/dL (ref 13.0–17.0)
MCH: 30.6 pg (ref 26.0–34.0)
MCH: 30.9 pg (ref 26.0–34.0)
MCHC: 34.3 g/dL (ref 30.0–36.0)
MCHC: 34.7 g/dL (ref 30.0–36.0)
MCV: 89.2 fL (ref 78.0–100.0)
MCV: 89.2 fL (ref 78.0–100.0)
Platelets: 145 10*3/uL — ABNORMAL LOW (ref 150–400)
Platelets: 190 10*3/uL (ref 150–400)
RBC: 4.16 MIL/uL — ABNORMAL LOW (ref 4.22–5.81)
RBC: 4.9 MIL/uL (ref 4.22–5.81)
RDW: 13.6 % (ref 11.5–15.5)
RDW: 13.7 % (ref 11.5–15.5)
WBC: 10.7 10*3/uL — ABNORMAL HIGH (ref 4.0–10.5)
WBC: 8.1 10*3/uL (ref 4.0–10.5)

## 2010-10-01 LAB — COMPREHENSIVE METABOLIC PANEL
ALT: 20 U/L (ref 0–53)
AST: 17 U/L (ref 0–37)
Albumin: 3.3 g/dL — ABNORMAL LOW (ref 3.5–5.2)
Alkaline Phosphatase: 84 U/L (ref 39–117)
BUN: 24 mg/dL — ABNORMAL HIGH (ref 6–23)
CO2: 23 mEq/L (ref 19–32)
Calcium: 8.6 mg/dL (ref 8.4–10.5)
Chloride: 112 mEq/L (ref 96–112)
Creatinine, Ser: 2.38 mg/dL — ABNORMAL HIGH (ref 0.4–1.5)
GFR calc Af Amer: 36 mL/min — ABNORMAL LOW (ref >60)
GFR calc non Af Amer: 30 mL/min — ABNORMAL LOW (ref >60)
Glucose, Bld: 101 mg/dL — ABNORMAL HIGH (ref 70–99)
Potassium: 4.4 mEq/L (ref 3.5–5.1)
Sodium: 140 mEq/L (ref 135–145)
Total Bilirubin: 0.5 mg/dL (ref 0.3–1.2)
Total Protein: 5.7 g/dL — ABNORMAL LOW (ref 6.0–8.3)

## 2010-10-01 LAB — URINE CULTURE
Colony Count: NO GROWTH
Culture  Setup Time: 201108120041
Culture: NO GROWTH
Special Requests: NEGATIVE

## 2010-10-01 LAB — UIFE/LIGHT CHAINS/TP QN, 24-HR UR
Albumin, U: DETECTED
Alpha 1, Urine: NOT DETECTED
Alpha 2, Urine: NOT DETECTED
Beta, Urine: NOT DETECTED
Free Kappa Lt Chains,Ur: 0.64 mg/dL (ref 0.04–1.51)
Free Kappa/Lambda Ratio: 9.14 ratio — ABNORMAL HIGH (ref 0.46–4.00)
Free Lambda Excretion/Day: 2.82 mg/d
Free Lambda Lt Chains,Ur: 0.07 mg/dL — ABNORMAL LOW (ref 0.08–1.01)
Free Lt Chn Excr Rate: 25.76 mg/d
Gamma Globulin, Urine: NOT DETECTED
Time: 24 hours
Total Protein, Urine-Ur/day: 48 mg/d (ref 10–140)
Total Protein, Urine: 1.2 mg/dL
Volume, Urine: 4025 mL

## 2010-10-01 LAB — BASIC METABOLIC PANEL
BUN: 15 mg/dL (ref 6–23)
CO2: 25 mEq/L (ref 19–32)
Calcium: 8.5 mg/dL (ref 8.4–10.5)
Chloride: 110 mEq/L (ref 96–112)
Creatinine, Ser: 1.5 mg/dL (ref 0.4–1.5)
GFR calc Af Amer: >60 mL/min (ref >60)
GFR calc non Af Amer: 51 mL/min — ABNORMAL LOW (ref >60)
Glucose, Bld: 95 mg/dL (ref 70–99)
Potassium: 4 mEq/L (ref 3.5–5.1)
Sodium: 140 mEq/L (ref 135–145)

## 2010-10-01 LAB — LIPASE, BLOOD: Lipase: 33 U/L (ref 11–59)

## 2010-10-01 LAB — GLUCOSE, CAPILLARY: Glucose-Capillary: 102 mg/dL — ABNORMAL HIGH (ref 70–99)

## 2010-10-01 LAB — AMYLASE: Amylase: 40 U/L (ref 0–105)

## 2010-10-01 LAB — LACTIC ACID, PLASMA: Lactic Acid, Venous: 1.1 mmol/L (ref 0.5–2.2)

## 2010-12-30 ENCOUNTER — Inpatient Hospital Stay: Admit: 2010-12-30 | Payer: Self-pay | Admitting: Urology

## 2010-12-30 ENCOUNTER — Encounter (HOSPITAL_COMMUNITY): Payer: Self-pay

## 2010-12-30 ENCOUNTER — Emergency Department (HOSPITAL_COMMUNITY): Payer: Self-pay

## 2010-12-30 ENCOUNTER — Inpatient Hospital Stay (HOSPITAL_COMMUNITY): Admission: AD | Admit: 2010-12-30 | Payer: Self-pay | Source: Ambulatory Visit | Admitting: Urology

## 2010-12-30 ENCOUNTER — Ambulatory Visit (HOSPITAL_COMMUNITY)
Admission: EM | Admit: 2010-12-30 | Discharge: 2010-12-30 | Disposition: A | Discharge status: Home / Self Care | Payer: Self-pay | Source: Ambulatory Visit | Attending: Urology | Admitting: Urology

## 2010-12-30 ENCOUNTER — Emergency Department (HOSPITAL_COMMUNITY)
Admission: EM | Admit: 2010-12-30 | Discharge: 2010-12-30 | Disposition: A | Discharge status: Other Acute Inpatient Hospital | Payer: Self-pay | Source: Home / Self Care | Attending: Emergency Medicine | Admitting: Emergency Medicine

## 2010-12-30 DIAGNOSIS — N1330 Unspecified hydronephrosis: Secondary | ICD-10-CM | POA: Insufficient documentation

## 2010-12-30 DIAGNOSIS — N509 Disorder of male genital organs, unspecified: Secondary | ICD-10-CM | POA: Insufficient documentation

## 2010-12-30 DIAGNOSIS — N201 Calculus of ureter: Secondary | ICD-10-CM | POA: Insufficient documentation

## 2010-12-30 DIAGNOSIS — R110 Nausea: Secondary | ICD-10-CM | POA: Insufficient documentation

## 2010-12-30 DIAGNOSIS — R109 Unspecified abdominal pain: Secondary | ICD-10-CM | POA: Insufficient documentation

## 2010-12-30 DIAGNOSIS — F17200 Nicotine dependence, unspecified, uncomplicated: Secondary | ICD-10-CM | POA: Insufficient documentation

## 2010-12-30 DIAGNOSIS — R10813 Right lower quadrant abdominal tenderness: Secondary | ICD-10-CM | POA: Insufficient documentation

## 2010-12-30 DIAGNOSIS — R300 Dysuria: Secondary | ICD-10-CM | POA: Insufficient documentation

## 2010-12-30 HISTORY — DX: Personal history of urinary calculi: Z87.442

## 2010-12-30 LAB — URINALYSIS, ROUTINE W REFLEX MICROSCOPIC
Bilirubin Urine: NEGATIVE
Glucose, UA: NEGATIVE mg/dL
Ketones, ur: NEGATIVE mg/dL
Leukocytes, UA: NEGATIVE
Nitrite: NEGATIVE
Protein, ur: NEGATIVE mg/dL
Specific Gravity, Urine: 1.021 (ref 1.005–1.030)
Urobilinogen, UA: 0.2 mg/dL (ref 0.0–1.0)
pH: 5.5 (ref 5.0–8.0)

## 2010-12-30 LAB — BASIC METABOLIC PANEL
BUN: 15 mg/dL (ref 6–23)
CO2: 30 mEq/L (ref 19–32)
Calcium: 9.1 mg/dL (ref 8.4–10.5)
Chloride: 103 mEq/L (ref 96–112)
Creatinine, Ser: 1.39 mg/dL (ref 0.4–1.5)
GFR calc Af Amer: >60 mL/min (ref >60)
GFR calc non Af Amer: 55 mL/min — ABNORMAL LOW (ref >60)
Glucose, Bld: 127 mg/dL — ABNORMAL HIGH (ref 70–99)
Potassium: 4.1 mEq/L (ref 3.5–5.1)
Sodium: 139 mEq/L (ref 135–145)

## 2010-12-30 LAB — CBC
HCT: 46.5 % (ref 39.0–52.0)
Hemoglobin: 16.6 g/dL (ref 13.0–17.0)
MCH: 30.9 pg (ref 26.0–34.0)
MCHC: 35.7 g/dL (ref 30.0–36.0)
MCV: 86.4 fL (ref 78.0–100.0)
Platelets: 210 10*3/uL (ref 150–400)
RBC: 5.38 MIL/uL (ref 4.22–5.81)
RDW: 13 % (ref 11.5–15.5)
WBC: 12.8 10*3/uL — ABNORMAL HIGH (ref 4.0–10.5)

## 2010-12-30 LAB — DIFFERENTIAL
Basophils Absolute: 0 10*3/uL (ref 0.0–0.1)
Basophils Relative: 0 % (ref 0–1)
Eosinophils Absolute: 0.5 10*3/uL (ref 0.0–0.7)
Eosinophils Relative: 4 % (ref 0–5)
Lymphocytes Relative: 14 % (ref 12–46)
Lymphs Abs: 1.9 10*3/uL (ref 0.7–4.0)
Monocytes Absolute: 0.5 10*3/uL (ref 0.1–1.0)
Monocytes Relative: 4 % (ref 3–12)
Neutro Abs: 10 10*3/uL — ABNORMAL HIGH (ref 1.7–7.7)
Neutrophils Relative %: 78 % — ABNORMAL HIGH (ref 43–77)

## 2010-12-30 LAB — URINE MICROSCOPIC-ADD ON

## 2011-01-08 NOTE — Op Note (Signed)
[name], [name] NO.:  [id number]  MEDICAL RECORD NO.:  [id number]  LOCATION:  DAY                          FACILITY:  [facility]  PHYSICIAN:  Danae Chen, M.D.  DATE OF BIRTH:  22-Oct-1965  DATE OF PROCEDURE:  12/30/2010 DATE OF DISCHARGE:                              OPERATIVE REPORT   PREOPERATIVE DIAGNOSES: 1. Right ureteral stone. 2. Right hydronephrosis.  POSTOPERATIVE DIAGNOSES: 1. Right ureteral stone. 2. Right hydronephrosis.  PROCEDURES PERFORMED: 1. Cystoscopy. 2. Right retrograde pyelogram. 3. Ureteroscopy. 4. Right ureteral stone extraction and insertion of double-J stent.  SURGEON:  Danae Chen, M.D.  ANESTHESIA:  General.  INDICATIONS FOR PROCEDURE:  The patient is a 45 year old male who presented to the emergency room last night complaining of sudden onset of severe right flank and right lower quadrant pain associated with nausea and vomiting.  He was treated with analgesics and he continued to have pain.  A CT scan showed a 4 mm stone in the right distal ureter and bilateral small nonobstructing renal calculi.  He also had right hydronephrosis.  He has continued to have pain.  He is scheduled for cystoscopy and stone manipulation.  The patient had a left ureteral stone extraction done a year ago by Dr. Vernie Ammons.  DESCRIPTION OF PROCEDURE:  The patient was identified by his wrist band and proper time out was taken.  Under general anesthesia, he was prepped and draped and placed in the dorsal lithotomy position.  A panendoscope was inserted in the bladder. The urethra is normal.  The bladder mucosa is normal.  There is no stone or tumor in the bladder.  The ureteral orifices are in normal position and shape.  Right retrograde pyelogram, a cone-tip catheter was passed through the cystoscope and towards the right ureteral orifices.  Contrast was then injected through the cone-tip catheter.  The ureter is mildly dilated. There is a  filling defect in the distal ureter, proximal to the UVJ. The cone-tip catheter was then removed.  A sensor wire was passed through the cystoscope and through the right ureteral orifices up to the renal pelvis.  The cystoscope was removed.  A #6.5 French semirigid ureteroscope was passed in the bladder and through the ureteral orifice, but the ureteroscope could not passed beyond the intramural ureter.  The ureteroscope was removed.  Ureteroscope access sheath was then passed over the sensor wire and the intramural ureter was dilated. The ureteroscope access sheath was removed.  The ureteroscope was then passed in the bladder and through the right ureteral orifice and advanced without difficulty in the distal ureter.  The stone was visualized in the distal ureter.  It was then grasped using the wires of a nitinol basket and extracted without difficulty.  The ureteroscope was then reinserted in the bladder and passed through the ureteral orifice. Contrast was then injected through the ureteroscope, and there was no evidence of filling defect in the ureter.  There is no evidence of extravasation of contrast.  The ureteroscope was then removed.  The sensor wire was then back loaded into the cystoscope and a #6 [place] - 26 double-J stent was passed over the  guidewire.  The proximal curl of the double-J stent was in the renal pelvis.  The distal curl is in the bladder.  The bladder was then emptied and the cystoscope and guidewire were removed.  The patient tolerated the procedure well and left the OR in satisfactory condition to postanesthesia care unit.     Danae Chen, M.D.     MN/MEDQ  D:  12/30/2010  T:  12/30/2010  Job:  [id number]  Electronically Signed by Lindaann Slough M.D. on 01/08/2011 02:01:02 PM

## 2011-01-11 ENCOUNTER — Inpatient Hospital Stay (HOSPITAL_COMMUNITY)
Admission: AD | Admit: 2011-01-11 | Discharge: 2011-01-18 | DRG: 699 | Disposition: A | Discharge status: Home / Self Care | Payer: Self-pay | Source: Ambulatory Visit | Attending: Urology | Admitting: Urology

## 2011-01-11 ENCOUNTER — Inpatient Hospital Stay (HOSPITAL_COMMUNITY): Payer: Self-pay

## 2011-01-11 DIAGNOSIS — F319 Bipolar disorder, unspecified: Secondary | ICD-10-CM | POA: Diagnosis present

## 2011-01-11 DIAGNOSIS — F17200 Nicotine dependence, unspecified, uncomplicated: Secondary | ICD-10-CM | POA: Diagnosis present

## 2011-01-11 DIAGNOSIS — I129 Hypertensive chronic kidney disease with stage 1 through stage 4 chronic kidney disease, or unspecified chronic kidney disease: Secondary | ICD-10-CM | POA: Diagnosis present

## 2011-01-11 DIAGNOSIS — Z91041 Radiographic dye allergy status: Secondary | ICD-10-CM

## 2011-01-11 DIAGNOSIS — T368X5A Adverse effect of other systemic antibiotics, initial encounter: Secondary | ICD-10-CM | POA: Diagnosis not present

## 2011-01-11 DIAGNOSIS — N390 Urinary tract infection, site not specified: Secondary | ICD-10-CM | POA: Diagnosis present

## 2011-01-11 DIAGNOSIS — Y846 Urinary catheterization as the cause of abnormal reaction of the patient, or of later complication, without mention of misadventure at the time of the procedure: Secondary | ICD-10-CM | POA: Diagnosis present

## 2011-01-11 DIAGNOSIS — R7881 Bacteremia: Secondary | ICD-10-CM | POA: Diagnosis present

## 2011-01-11 DIAGNOSIS — R748 Abnormal levels of other serum enzymes: Secondary | ICD-10-CM | POA: Diagnosis not present

## 2011-01-11 DIAGNOSIS — E8779 Other fluid overload: Secondary | ICD-10-CM | POA: Diagnosis not present

## 2011-01-11 DIAGNOSIS — N189 Chronic kidney disease, unspecified: Secondary | ICD-10-CM | POA: Diagnosis present

## 2011-01-11 DIAGNOSIS — N410 Acute prostatitis: Secondary | ICD-10-CM | POA: Diagnosis present

## 2011-01-11 DIAGNOSIS — A4901 Methicillin susceptible Staphylococcus aureus infection, unspecified site: Secondary | ICD-10-CM | POA: Diagnosis present

## 2011-01-11 DIAGNOSIS — IMO0002 Reserved for concepts with insufficient information to code with codable children: Principal | ICD-10-CM | POA: Diagnosis present

## 2011-01-11 DIAGNOSIS — Z880 Allergy status to penicillin: Secondary | ICD-10-CM

## 2011-01-11 DIAGNOSIS — Z87442 Personal history of urinary calculi: Secondary | ICD-10-CM

## 2011-01-11 DIAGNOSIS — L299 Pruritus, unspecified: Secondary | ICD-10-CM | POA: Diagnosis not present

## 2011-01-11 DIAGNOSIS — F411 Generalized anxiety disorder: Secondary | ICD-10-CM | POA: Diagnosis present

## 2011-01-11 LAB — BASIC METABOLIC PANEL
BUN: 21 mg/dL (ref 6–23)
CO2: 20 mEq/L (ref 19–32)
Calcium: 9.3 mg/dL (ref 8.4–10.5)
Chloride: 98 mEq/L (ref 96–112)
Creatinine, Ser: 1.61 mg/dL — ABNORMAL HIGH (ref 0.50–1.35)
GFR calc Af Amer: 56 mL/min — ABNORMAL LOW (ref >60)
GFR calc non Af Amer: 47 mL/min — ABNORMAL LOW (ref >60)
Glucose, Bld: 122 mg/dL — ABNORMAL HIGH (ref 70–99)
Potassium: 3.3 mEq/L — ABNORMAL LOW (ref 3.5–5.1)
Sodium: 131 mEq/L — ABNORMAL LOW (ref 135–145)

## 2011-01-11 LAB — CBC
HCT: 43.6 % (ref 39.0–52.0)
Hemoglobin: 15.4 g/dL (ref 13.0–17.0)
MCH: 30 pg (ref 26.0–34.0)
MCHC: 35.3 g/dL (ref 30.0–36.0)
MCV: 85 fL (ref 78.0–100.0)
Platelets: 162 10*3/uL (ref 150–400)
RBC: 5.13 MIL/uL (ref 4.22–5.81)
RDW: 12.9 % (ref 11.5–15.5)
WBC: 19.6 10*3/uL — ABNORMAL HIGH (ref 4.0–10.5)

## 2011-01-11 LAB — GENTAMICIN LEVEL, RANDOM: Gentamicin Rm: 3.3 ug/mL

## 2011-01-11 LAB — URINALYSIS, ROUTINE W REFLEX MICROSCOPIC
Glucose, UA: NEGATIVE mg/dL
Nitrite: POSITIVE — AB
Protein, ur: >300 mg/dL — AB
Specific Gravity, Urine: 1.029 (ref 1.005–1.030)
Urobilinogen, UA: 1 mg/dL (ref 0.0–1.0)
pH: 6 (ref 5.0–8.0)

## 2011-01-11 LAB — URINE MICROSCOPIC-ADD ON

## 2011-01-12 ENCOUNTER — Inpatient Hospital Stay (HOSPITAL_COMMUNITY): Payer: Self-pay

## 2011-01-12 LAB — COMPREHENSIVE METABOLIC PANEL
ALT: 24 U/L (ref 0–53)
AST: 33 U/L (ref 0–37)
Albumin: 2.9 g/dL — ABNORMAL LOW (ref 3.5–5.2)
Alkaline Phosphatase: 85 U/L (ref 39–117)
BUN: 25 mg/dL — ABNORMAL HIGH (ref 6–23)
CO2: 22 mEq/L (ref 19–32)
Calcium: 8.3 mg/dL — ABNORMAL LOW (ref 8.4–10.5)
Chloride: 95 mEq/L — ABNORMAL LOW (ref 96–112)
Creatinine, Ser: 1.81 mg/dL — ABNORMAL HIGH (ref 0.50–1.35)
GFR calc Af Amer: 49 mL/min — ABNORMAL LOW (ref >60)
GFR calc non Af Amer: 41 mL/min — ABNORMAL LOW (ref >60)
Glucose, Bld: 119 mg/dL — ABNORMAL HIGH (ref 70–99)
Potassium: 3.5 mEq/L (ref 3.5–5.1)
Sodium: 127 mEq/L — ABNORMAL LOW (ref 135–145)
Total Bilirubin: 0.4 mg/dL (ref 0.3–1.2)
Total Protein: 6.8 g/dL (ref 6.0–8.3)

## 2011-01-12 LAB — CBC
HCT: 41.3 % (ref 39.0–52.0)
Hemoglobin: 13.9 g/dL (ref 13.0–17.0)
MCH: 28.8 pg (ref 26.0–34.0)
MCHC: 33.7 g/dL (ref 30.0–36.0)
MCV: 85.5 fL (ref 78.0–100.0)
Platelets: 151 10*3/uL (ref 150–400)
RBC: 4.83 MIL/uL (ref 4.22–5.81)
RDW: 13 % (ref 11.5–15.5)
WBC: 13.7 10*3/uL — ABNORMAL HIGH (ref 4.0–10.5)

## 2011-01-12 LAB — CARDIAC PANEL(CRET KIN+CKTOT+MB+TROPI)
CK, MB: 3 ng/mL (ref 0.3–4.0)
CK, MB: 3.4 ng/mL (ref 0.3–4.0)
CK, MB: 3.2 ng/mL (ref 0.3–4.0)
Relative Index: 0.5 (ref 0.0–2.5)
Relative Index: 0.6 (ref 0.0–2.5)
Relative Index: 0.6 (ref 0.0–2.5)
Total CK: 593 U/L — ABNORMAL HIGH (ref 7–232)
Total CK: 575 U/L — ABNORMAL HIGH (ref 7–232)
Total CK: 541 U/L — ABNORMAL HIGH (ref 7–232)
Troponin I: <0.3 ng/mL (ref <0.30)
Troponin I: <0.3 ng/mL (ref <0.30)
Troponin I: <0.3 ng/mL (ref <0.30)

## 2011-01-12 LAB — MRSA PCR SCREENING: MRSA by PCR: POSITIVE — AB

## 2011-01-12 LAB — PROCALCITONIN: Procalcitonin: 1.7 ng/mL

## 2011-01-12 LAB — LACTIC ACID, PLASMA: Lactic Acid, Venous: 0.5 mmol/L (ref 0.5–2.2)

## 2011-01-13 LAB — URINE CULTURE
Colony Count: >=100000
Culture  Setup Time: 201206261417
Special Requests: NEGATIVE

## 2011-01-14 LAB — CULTURE, BLOOD (ROUTINE X 2)
Culture  Setup Time: 201206261418
Culture  Setup Time: 201206261418

## 2011-01-14 LAB — COMPREHENSIVE METABOLIC PANEL
ALT: 100 U/L — ABNORMAL HIGH (ref 0–53)
AST: 94 U/L — ABNORMAL HIGH (ref 0–37)
Albumin: 2.4 g/dL — ABNORMAL LOW (ref 3.5–5.2)
Alkaline Phosphatase: 201 U/L — ABNORMAL HIGH (ref 39–117)
BUN: 14 mg/dL (ref 6–23)
CO2: 23 mEq/L (ref 19–32)
Calcium: 8.8 mg/dL (ref 8.4–10.5)
Chloride: 102 mEq/L (ref 96–112)
Creatinine, Ser: 1.14 mg/dL (ref 0.50–1.35)
GFR calc Af Amer: >60 mL/min (ref >60)
GFR calc non Af Amer: >60 mL/min (ref >60)
Glucose, Bld: 112 mg/dL — ABNORMAL HIGH (ref 70–99)
Potassium: 3.9 mEq/L (ref 3.5–5.1)
Sodium: 132 mEq/L — ABNORMAL LOW (ref 135–145)
Total Bilirubin: 0.3 mg/dL (ref 0.3–1.2)
Total Protein: 5.8 g/dL — ABNORMAL LOW (ref 6.0–8.3)

## 2011-01-14 LAB — CBC
HCT: 34.6 % — ABNORMAL LOW (ref 39.0–52.0)
Hemoglobin: 12 g/dL — ABNORMAL LOW (ref 13.0–17.0)
MCH: 29.4 pg (ref 26.0–34.0)
MCHC: 34.7 g/dL (ref 30.0–36.0)
MCV: 84.8 fL (ref 78.0–100.0)
Platelets: 132 10*3/uL — ABNORMAL LOW (ref 150–400)
RBC: 4.08 MIL/uL — ABNORMAL LOW (ref 4.22–5.81)
RDW: 13.7 % (ref 11.5–15.5)
WBC: 7.2 10*3/uL (ref 4.0–10.5)

## 2011-01-15 ENCOUNTER — Inpatient Hospital Stay (HOSPITAL_COMMUNITY): Payer: Self-pay

## 2011-01-15 DIAGNOSIS — B958 Unspecified staphylococcus as the cause of diseases classified elsewhere: Secondary | ICD-10-CM

## 2011-01-15 DIAGNOSIS — N410 Acute prostatitis: Secondary | ICD-10-CM

## 2011-01-15 DIAGNOSIS — R7881 Bacteremia: Secondary | ICD-10-CM

## 2011-01-15 LAB — CBC
HCT: 34.3 % — ABNORMAL LOW (ref 39.0–52.0)
Hemoglobin: 11.8 g/dL — ABNORMAL LOW (ref 13.0–17.0)
MCH: 29.6 pg (ref 26.0–34.0)
MCHC: 34.4 g/dL (ref 30.0–36.0)
MCV: 86 fL (ref 78.0–100.0)
Platelets: 152 10*3/uL (ref 150–400)
RBC: 3.99 MIL/uL — ABNORMAL LOW (ref 4.22–5.81)
RDW: 13.9 % (ref 11.5–15.5)
WBC: 8.3 10*3/uL (ref 4.0–10.5)

## 2011-01-15 LAB — COMPREHENSIVE METABOLIC PANEL
ALT: 92 U/L — ABNORMAL HIGH (ref 0–53)
AST: 48 U/L — ABNORMAL HIGH (ref 0–37)
Albumin: 2.4 g/dL — ABNORMAL LOW (ref 3.5–5.2)
Alkaline Phosphatase: 234 U/L — ABNORMAL HIGH (ref 39–117)
BUN: 11 mg/dL (ref 6–23)
CO2: 27 mEq/L (ref 19–32)
Calcium: 8.5 mg/dL (ref 8.4–10.5)
Chloride: 97 mEq/L (ref 96–112)
Creatinine, Ser: 1.14 mg/dL (ref 0.50–1.35)
GFR calc Af Amer: >60 mL/min (ref >60)
GFR calc non Af Amer: >60 mL/min (ref >60)
Glucose, Bld: 97 mg/dL (ref 70–99)
Potassium: 3.7 mEq/L (ref 3.5–5.1)
Sodium: 131 mEq/L — ABNORMAL LOW (ref 135–145)
Total Bilirubin: 0.7 mg/dL (ref 0.3–1.2)
Total Protein: 6.1 g/dL (ref 6.0–8.3)

## 2011-01-15 LAB — GENTAMICIN LEVEL, RANDOM: Gentamicin Rm: 1.7 ug/mL

## 2011-01-15 LAB — LIPASE, BLOOD: Lipase: 14 U/L (ref 11–59)

## 2011-01-15 LAB — AMYLASE: Amylase: 48 U/L (ref 0–105)

## 2011-01-15 LAB — CK: Total CK: 146 U/L (ref 7–232)

## 2011-01-16 ENCOUNTER — Inpatient Hospital Stay (HOSPITAL_COMMUNITY): Payer: Self-pay

## 2011-01-16 LAB — COMPREHENSIVE METABOLIC PANEL
ALT: 86 U/L — ABNORMAL HIGH (ref 0–53)
AST: 44 U/L — ABNORMAL HIGH (ref 0–37)
Albumin: 2.5 g/dL — ABNORMAL LOW (ref 3.5–5.2)
Alkaline Phosphatase: 235 U/L — ABNORMAL HIGH (ref 39–117)
BUN: 12 mg/dL (ref 6–23)
CO2: 31 mEq/L (ref 19–32)
Calcium: 8.9 mg/dL (ref 8.4–10.5)
Chloride: 94 mEq/L — ABNORMAL LOW (ref 96–112)
Creatinine, Ser: 1.07 mg/dL (ref 0.50–1.35)
GFR calc Af Amer: >60 mL/min (ref >60)
GFR calc non Af Amer: >60 mL/min (ref >60)
Glucose, Bld: 102 mg/dL — ABNORMAL HIGH (ref 70–99)
Potassium: 3.2 mEq/L — ABNORMAL LOW (ref 3.5–5.1)
Sodium: 134 mEq/L — ABNORMAL LOW (ref 135–145)
Total Bilirubin: 0.5 mg/dL (ref 0.3–1.2)
Total Protein: 6.3 g/dL (ref 6.0–8.3)

## 2011-01-16 LAB — CBC
HCT: 36.7 % — ABNORMAL LOW (ref 39.0–52.0)
Hemoglobin: 12.8 g/dL — ABNORMAL LOW (ref 13.0–17.0)
MCH: 29.8 pg (ref 26.0–34.0)
MCHC: 34.9 g/dL (ref 30.0–36.0)
MCV: 85.3 fL (ref 78.0–100.0)
Platelets: 189 10*3/uL (ref 150–400)
RBC: 4.3 MIL/uL (ref 4.22–5.81)
RDW: 13.8 % (ref 11.5–15.5)
WBC: 7.4 10*3/uL (ref 4.0–10.5)

## 2011-01-16 LAB — PROTIME-INR
INR: 1.12 (ref 0.00–1.49)
Prothrombin Time: 14.6 seconds (ref 11.6–15.2)

## 2011-01-17 DIAGNOSIS — R7881 Bacteremia: Secondary | ICD-10-CM

## 2011-01-17 DIAGNOSIS — B958 Unspecified staphylococcus as the cause of diseases classified elsewhere: Secondary | ICD-10-CM

## 2011-01-17 DIAGNOSIS — N410 Acute prostatitis: Secondary | ICD-10-CM

## 2011-01-17 LAB — COMPREHENSIVE METABOLIC PANEL
ALT: 67 U/L — ABNORMAL HIGH (ref 0–53)
AST: 29 U/L (ref 0–37)
Albumin: 2.6 g/dL — ABNORMAL LOW (ref 3.5–5.2)
Alkaline Phosphatase: 212 U/L — ABNORMAL HIGH (ref 39–117)
BUN: 13 mg/dL (ref 6–23)
CO2: 28 mEq/L (ref 19–32)
Calcium: 9.3 mg/dL (ref 8.4–10.5)
Chloride: 97 mEq/L (ref 96–112)
Creatinine, Ser: 1.06 mg/dL (ref 0.50–1.35)
GFR calc Af Amer: >60 mL/min (ref >60)
GFR calc non Af Amer: >60 mL/min (ref >60)
Glucose, Bld: 93 mg/dL (ref 70–99)
Potassium: 3.5 mEq/L (ref 3.5–5.1)
Sodium: 137 mEq/L (ref 135–145)
Total Bilirubin: 0.6 mg/dL (ref 0.3–1.2)
Total Protein: 6.7 g/dL (ref 6.0–8.3)

## 2011-01-17 LAB — CBC
HCT: 38.6 % — ABNORMAL LOW (ref 39.0–52.0)
Hemoglobin: 13 g/dL (ref 13.0–17.0)
MCH: 28.8 pg (ref 26.0–34.0)
MCHC: 33.7 g/dL (ref 30.0–36.0)
MCV: 85.4 fL (ref 78.0–100.0)
Platelets: 280 10*3/uL (ref 150–400)
RBC: 4.52 MIL/uL (ref 4.22–5.81)
RDW: 13.8 % (ref 11.5–15.5)
WBC: 8.8 10*3/uL (ref 4.0–10.5)

## 2011-01-27 NOTE — H&P (Signed)
  NAMECLAUDY, [name]                  ACCOUNT NO.:  [id number]  MEDICAL RECORD NO.:  [id number]  LOCATION:  1236                         FACILITY:  [facility]  PHYSICIAN:  Danae Chen, M.D.  DATE OF BIRTH:  [birth year]  DATE OF ADMISSION:  01/11/2011 DATE OF DISCHARGE:  01/11/2011                             HISTORY & PHYSICAL   CHIEF COMPLAINT:  Right flank pain and fever.  The patient is a 45 year old male who had right distal ureteral stone extraction with insertion of double-J stent on December 30, 2010.  He was doing well until last night when he started having sudden severe right flank pain, associated with nausea and shaking chills.  He was seen in the office this morning with a temperature of 103.4.  He was then admitted for pain control and IV antibiotics.  PAST MEDICAL HISTORY: 1. Positive for kidney stones. 2. Anxiety. 3. Chronic renal failure. 4. Depression. 5. Hypertension.  PAST SURGICAL HISTORY:  He had a cystoscopy with stone extraction on June 14th.  He has a past history of cystoscopy and stone extraction about a year ago.  He has a history of ear surgery, foot surgery, hernia repair, nose surgery, and wrist surgery.  MEDICATIONS:  Ibuprofen.  ALLERGIES:  Allergic to, 1. PENICILLIN. 2. CONTRAST DYE.  FAMILY HISTORY:  Positive for hypertension, heart disease, dementia, Parkinson disease, and CVA.  SOCIAL HISTORY:  He is single, has smoked about 2 pack a day for 30 years and does not drink.  REVIEW OF SYSTEMS:  As noted in the HPI.  He has frequency, urgency, and dysuria.  He has nausea, vomiting, right flank pain, and abdominal pain. He has night sweat, fever, fatigue.  Everything else is negative.  PHYSICAL EXAMINATION:  GENERAL:  This is a well-developed 45 years old male who is in acute distress due to fever and flank pain.  He appears will.  He is well-nourished and well-developed. EARS, NOSE, AND THROAT:  Within normal limits. NECK:  Supple.  No  cervical adenopathy.  No thyromegaly. CHEST:  Symmetrical. LUNGS:  Fully expanded and clear to percussion and auscultation. HEART:  Regular rhythm. ABDOMEN:  Soft and nondistended.  He has moderate right flank and right CVA tenderness.  Kidneys are not palpable.  He has no hepatomegaly, no splenomegaly. GU:  Bladder is not distended.  Penis and scrotal contents are within normal limits. EXTREMITIES:  Within normal limits.  A KUB shows the distal end of the double-J stent is in the prostatic urethra.  Urinalysis shows many WBCs and RBCs.  His BUN is 21, creatinine 1.61.  Sodium 131, potassium 3.3, glucose 122, hemoglobin is 15.4, hematocrit 43.6, and WBC 19.6.  IMPRESSION:  Urosepsis, status post right ureteral stone extraction with double-J stent.  PLAN:  Urine culture and sensitivity.  IV antibiotics, Rocephin, and gentamicin, remove double-J stent.     Danae Chen, M.D.     MN/MEDQ  D:  01/11/2011  T:  01/12/2011  Job:  [id number]  Electronically Signed by Lindaann Slough M.D. on 01/27/2011 09:29:00 PM

## 2011-01-27 NOTE — Discharge Summary (Signed)
[name], [name]                  ACCOUNT NO.:  [id number]  MEDICAL RECORD NO.:  [id number]  LOCATION:  1316                         FACILITY:  [facility]  PHYSICIAN:  Danae Chen, M.D.  DATE OF BIRTH:  1966-05-13  DATE OF ADMISSION:  01/11/2011 DATE OF DISCHARGE:  01/18/2011                              DISCHARGE SUMMARY   DISCHARGE DIAGNOSIS:  Urosepsis.  The patient is a 45 year old male who had a right distal ureteral stone extraction on December 30, 2010.  He was doing well until June 25th when he started having sudden onset of severe right flank pain, associated with nausea and shaking chills.  He was seen in the office on June 26th with a temperature of 103.4.  He was then admitted for further treatment.  PAST MEDICAL HISTORY:  Positive for, 1. Bipolar disorders. 2. Hypertension. 3. Chronic renal failure. 4. Depression.  PHYSICAL EXAMINATION:  VITAL SIGNS:  Temperature on admission was 103.2. His blood pressure was 106/64, pulse 84, respirations 20. LUNGS:  Clear. HEART:  Regular rhythm. ABDOMEN:  Soft, nondistended.  He had no CVA tenderness.  Kidneys not palpable.  Bladder not distended.  Penis and scrotal contents were within normal limits.  His hemoglobin on admission was 15.4, hematocrit was 43.6, and WBC 19.6. Chest x-ray showed no evidence of active disease.  KUB showed that the distal end of the stent is in the prostatic urethra.  Urine and blood cultures were obtained and the patient was started on Rocephin and gentamicin.  He had a stormy course in the first few days of his hospitalization.  He became tachycardic and tachypneic.  He was then transferred to ICU and white count gradually came down.  Blood and urine culture were positive for Staphylococcus aureus.  At that point, gentamicin was discontinued and he was started on vancomycin, however, he started having itching and the vancomycin was discontinued and he was re-started on gentamicin and Cipro.  Cipro  was then switched to Levaquin.  He gradually improved.  He was then transferred to the floor. His creatinine on June 27th went up to 1.81 and BUN of 25.  With rehydration and IV antibiotics, his creatinine gradually went down and it was 1.14 on June 29th.  On June 30th, he became tachycardiac and short of breath.  A consultation with Internal Medicine was requested and chest x-ray showed cardiomegaly.  IV fluid was then discontinued and he was given Lasix p.o., he responded to the treatment.  He was continued on IV Levaquin and from January 16, 2011, until today, January 18, 2011, he did not have any more fever.  His white count on July 2nd was 8.8, hemoglobin 13.0, hematocrit 38.6.  His BUN on July 2nd was 13, creatinine 1.06.  He was voiding well.  He tolerated his diet well.  He had regular bowel movements.  He did not have any more pain.  He was then discharged home on January 18, 2011, on Levaquin 500 mg daily.  The patient will be followed as an outpatient.  Levaquin will be continued for 3 weeks.  Note that double-J stent was removed on June 26th. Also note that a consultation  with Infectious Disease was requested and the advise was to continue Levaquin for 3 weeks as an outpatient.     Danae Chen, M.D.     MN/MEDQ  D:  01/18/2011  T:  01/18/2011  Job:  [id number]  Electronically Signed by Lindaann Slough M.D. on 01/27/2011 09:28:05 PM

## 2011-01-27 NOTE — Op Note (Signed)
  NAMEMARLIN, [name] NO.:  [id number]  MEDICAL RECORD NO.:  [id number]  LOCATION:  1236                         FACILITY:  [facility]  PHYSICIAN:  Danae Chen, M.D.  DATE OF BIRTH:  1966-02-02  DATE OF PROCEDURE:  01/11/2011 DATE OF DISCHARGE:  01/11/2011                              OPERATIVE REPORT   PREPROCEDURE DIAGNOSIS:  Right double-J stent.  POSTPROCEDURE DIAGNOSIS:  Right double-J stent.  PROCEDURE DONE:  Cystoscopy and removal of double-J stent.  INDICATION:  The patient is a 45 year old male who had a right ureteral stone extraction on December 30, 2010.  The double-J stent was left in place after the procedure.  He was doing well until last night when he started having right flank pain, nausea, shaking chills, and fever.  He was seen in the office this morning with a temperature of 103.4.  KUB shows the distal end of the double-J stent is in the prostatic urethra.  He needs removal of the double-J stent. After instillation of 2% Xylocaine jelly in the urethra, a flexible cystoscope was passed without difficulty in the bladder.  The double-J stent was grasped with a grasping forceps and pulled out of the urethra without difficulty.  The double-J stent was removed in its entirety.  He tolerated the procedure well.     Danae Chen, M.D.     MN/MEDQ  D:  01/11/2011  T:  01/12/2011  Job:  [id number]  Electronically Signed by Lindaann Slough M.D. on 01/27/2011 09:29:33 PM
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
## 2011-02-06 ENCOUNTER — Emergency Department: Payer: Self-pay | Admitting: Emergency Medicine

## 2011-02-11 ENCOUNTER — Telehealth: Payer: Self-pay | Admitting: *Deleted
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
## 2011-02-12 MED ORDER — EPINEPHRINE 0.3 MG/0.3ML IJ DEVI: 0.3000 mg | Freq: Once | INTRAMUSCULAR | Status: DC

## 2011-02-12 NOTE — Telephone Encounter (Signed)
Done.  Please let them know.

## 2011-02-14 NOTE — Telephone Encounter (Signed)
Patient advised that rx sent to pharmacy

## 2012-01-21 ENCOUNTER — Emergency Department (HOSPITAL_COMMUNITY)
Admission: EM | Admit: 2012-01-21 | Discharge: 2012-01-22 | Disposition: A | Discharge status: Home / Self Care | Payer: 59 | Attending: Emergency Medicine | Admitting: Emergency Medicine

## 2012-01-21 ENCOUNTER — Emergency Department (HOSPITAL_COMMUNITY): Payer: 59

## 2012-01-21 ENCOUNTER — Encounter (HOSPITAL_COMMUNITY): Payer: Self-pay | Admitting: *Deleted

## 2012-01-21 DIAGNOSIS — S51809A Unspecified open wound of unspecified forearm, initial encounter: Secondary | ICD-10-CM | POA: Insufficient documentation

## 2012-01-21 DIAGNOSIS — IMO0002 Reserved for concepts with insufficient information to code with codable children: Secondary | ICD-10-CM | POA: Insufficient documentation

## 2012-01-21 MED ORDER — TRAMADOL HCL 50 MG PO TABS: 50.0000 mg | ORAL_TABLET | Freq: Four times a day (QID) | ORAL | Status: AC | PRN

## 2012-01-21 MED ORDER — SULFAMETHOXAZOLE-TRIMETHOPRIM 800-160 MG PO TABS: 1.0000 | ORAL_TABLET | Freq: Two times a day (BID) | ORAL | Status: AC
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
## 2012-06-21 ENCOUNTER — Emergency Department (HOSPITAL_COMMUNITY): Payer: Self-pay

## 2012-06-21 ENCOUNTER — Encounter (HOSPITAL_COMMUNITY): Payer: Self-pay | Admitting: *Deleted

## 2012-06-21 ENCOUNTER — Observation Stay (HOSPITAL_COMMUNITY)
Admission: EM | Admit: 2012-06-21 | Discharge: 2012-06-22 | DRG: 563 | Disposition: A | Discharge status: Home / Self Care | Payer: Self-pay | Attending: Orthopaedic Surgery | Admitting: Orthopaedic Surgery

## 2012-06-21 DIAGNOSIS — S32010A Wedge compression fracture of first lumbar vertebra, initial encounter for closed fracture: Secondary | ICD-10-CM

## 2012-06-21 DIAGNOSIS — S32009A Unspecified fracture of unspecified lumbar vertebra, initial encounter for closed fracture: Secondary | ICD-10-CM | POA: Diagnosis present

## 2012-06-21 DIAGNOSIS — W14XXXA Fall from tree, initial encounter: Secondary | ICD-10-CM

## 2012-06-21 DIAGNOSIS — S32000A Wedge compression fracture of unspecified lumbar vertebra, initial encounter for closed fracture: Secondary | ICD-10-CM | POA: Diagnosis present

## 2012-06-21 DIAGNOSIS — S92002A Unspecified fracture of left calcaneus, initial encounter for closed fracture: Secondary | ICD-10-CM | POA: Diagnosis present

## 2012-06-21 DIAGNOSIS — Z23 Encounter for immunization: Secondary | ICD-10-CM

## 2012-06-21 DIAGNOSIS — S92009A Unspecified fracture of unspecified calcaneus, initial encounter for closed fracture: Principal | ICD-10-CM | POA: Diagnosis present

## 2012-06-21 DIAGNOSIS — W1789XA Other fall from one level to another, initial encounter: Secondary | ICD-10-CM | POA: Diagnosis present

## 2012-06-21 LAB — URINALYSIS, ROUTINE W REFLEX MICROSCOPIC
Glucose, UA: NEGATIVE mg/dL
Hgb urine dipstick: NEGATIVE
Ketones, ur: NEGATIVE mg/dL
Leukocytes, UA: NEGATIVE
Nitrite: NEGATIVE
Protein, ur: NEGATIVE mg/dL
Specific Gravity, Urine: 1.03 (ref 1.005–1.030)
Urobilinogen, UA: 0.2 mg/dL (ref 0.0–1.0)
pH: 5 (ref 5.0–8.0)

## 2012-06-21 LAB — BASIC METABOLIC PANEL
BUN: 17 mg/dL (ref 6–23)
CO2: 21 mEq/L (ref 19–32)
Calcium: 9.1 mg/dL (ref 8.4–10.5)
Chloride: 107 mEq/L (ref 96–112)
Creatinine, Ser: 1.33 mg/dL (ref 0.50–1.35)
GFR calc Af Amer: 73 mL/min — ABNORMAL LOW (ref >90)
GFR calc non Af Amer: 63 mL/min — ABNORMAL LOW (ref >90)
Glucose, Bld: 99 mg/dL (ref 70–99)
Potassium: 4.7 mEq/L (ref 3.5–5.1)
Sodium: 141 mEq/L (ref 135–145)

## 2012-06-21 LAB — ETHANOL: Alcohol, Ethyl (B): <11 mg/dL (ref 0–11)

## 2012-06-21 LAB — CBC WITH DIFFERENTIAL/PLATELET
Basophils Absolute: 0 10*3/uL (ref 0.0–0.1)
Basophils Relative: 0 % (ref 0–1)
Eosinophils Absolute: 0.1 10*3/uL (ref 0.0–0.7)
Eosinophils Relative: 1 % (ref 0–5)
HCT: 44.9 % (ref 39.0–52.0)
Hemoglobin: 15.7 g/dL (ref 13.0–17.0)
Lymphocytes Relative: 8 % — ABNORMAL LOW (ref 12–46)
Lymphs Abs: 1.2 10*3/uL (ref 0.7–4.0)
MCH: 30.5 pg (ref 26.0–34.0)
MCHC: 35 g/dL (ref 30.0–36.0)
MCV: 87.4 fL (ref 78.0–100.0)
Monocytes Absolute: 0.7 10*3/uL (ref 0.1–1.0)
Monocytes Relative: 5 % (ref 3–12)
Neutro Abs: 12.4 10*3/uL — ABNORMAL HIGH (ref 1.7–7.7)
Neutrophils Relative %: 86 % — ABNORMAL HIGH (ref 43–77)
Platelets: 191 10*3/uL (ref 150–400)
RBC: 5.14 MIL/uL (ref 4.22–5.81)
RDW: 13.1 % (ref 11.5–15.5)
WBC: 14.4 10*3/uL — ABNORMAL HIGH (ref 4.0–10.5)

## 2012-06-21 LAB — RAPID URINE DRUG SCREEN, HOSP PERFORMED
Amphetamines: NOT DETECTED
Barbiturates: NOT DETECTED
Benzodiazepines: POSITIVE — AB
Cocaine: POSITIVE — AB
Opiates: POSITIVE — AB
Tetrahydrocannabinol: NOT DETECTED

## 2012-06-21 LAB — MRSA PCR SCREENING: MRSA by PCR: NEGATIVE

## 2012-06-21 MED ORDER — OXYCODONE-ACETAMINOPHEN 5-325 MG PO TABS
1.0000 | ORAL_TABLET | ORAL | Status: DC | PRN
Start: 2012-06-21 — End: 2012-06-27

## 2012-06-21 MED ORDER — METOCLOPRAMIDE HCL 10 MG PO TABS: 5.0000 mg | ORAL_TABLET | Freq: Three times a day (TID) | ORAL | Status: DC | PRN

## 2012-06-21 MED ORDER — HYDROMORPHONE HCL PF 1 MG/ML IJ SOLN
0.5000 mg | INTRAMUSCULAR | Status: DC | PRN
  Administered 2012-06-21 – 2012-06-22 (×5): 1 mg via INTRAVENOUS
  Filled 2012-06-21 (×6): qty 1

## 2012-06-21 MED ORDER — DIPHENHYDRAMINE HCL 50 MG/ML IJ SOLN
25.0000 mg | Freq: Once | INTRAMUSCULAR | Status: AC
  Administered 2012-06-21: 25 mg via INTRAVENOUS
  Filled 2012-06-21: qty 1

## 2012-06-21 MED ORDER — ONDANSETRON HCL 4 MG PO TABS: 4.0000 mg | ORAL_TABLET | Freq: Four times a day (QID) | ORAL | Status: DC | PRN

## 2012-06-21 MED ORDER — BISACODYL 5 MG PO TBEC: 5.0000 mg | DELAYED_RELEASE_TABLET | Freq: Every day | ORAL | Status: DC | PRN

## 2012-06-21 MED ORDER — HYDROMORPHONE HCL PF 1 MG/ML IJ SOLN
1.0000 mg | Freq: Once | INTRAMUSCULAR | Status: AC
  Administered 2012-06-21: 1 mg via INTRAVENOUS
  Filled 2012-06-21: qty 1

## 2012-06-21 MED ORDER — DEXTROSE-NACL 5-0.2 % IV SOLN
INTRAVENOUS | Status: DC
  Administered 2012-06-21: 20 mL/h via INTRAVENOUS

## 2012-06-21 MED ORDER — LORAZEPAM 2 MG/ML IJ SOLN: 1.0000 mg | Freq: Once | INTRAMUSCULAR | Status: DC

## 2012-06-21 MED ORDER — ASPIRIN 325 MG PO TABS
325.0000 mg | ORAL_TABLET | Freq: Two times a day (BID) | ORAL | Status: DC
  Administered 2012-06-22: 325 mg via ORAL
  Filled 2012-06-21 (×3): qty 1

## 2012-06-21 MED ORDER — METHOCARBAMOL 100 MG/ML IJ SOLN
500.0000 mg | Freq: Four times a day (QID) | INTRAVENOUS | Status: DC | PRN
  Filled 2012-06-21: qty 5

## 2012-06-21 MED ORDER — HYDROMORPHONE HCL PF 2 MG/ML IJ SOLN
2.0000 mg | Freq: Once | INTRAMUSCULAR | Status: AC
  Administered 2012-06-21: 2 mg via INTRAVENOUS
  Filled 2012-06-21: qty 1

## 2012-06-21 MED ORDER — METHOCARBAMOL 500 MG PO TABS: 500.0000 mg | ORAL_TABLET | Freq: Four times a day (QID) | ORAL | Status: DC | PRN

## 2012-06-21 MED ORDER — DIPHENHYDRAMINE HCL 12.5 MG/5ML PO ELIX
12.5000 mg | ORAL_SOLUTION | ORAL | Status: DC | PRN
  Administered 2012-06-21 – 2012-06-22 (×2): 25 mg via ORAL
  Filled 2012-06-21 (×2): qty 10

## 2012-06-21 MED ORDER — INFLUENZA VIRUS VACC SPLIT PF IM SUSP
0.5000 mL | INTRAMUSCULAR | Status: AC
  Administered 2012-06-22: 0.5 mL via INTRAMUSCULAR
  Filled 2012-06-21: qty 0.5

## 2012-06-21 MED ORDER — DIAZEPAM 5 MG/ML IJ SOLN
5.0000 mg | Freq: Once | INTRAMUSCULAR | Status: AC
  Administered 2012-06-21: 5 mg via INTRAVENOUS
  Filled 2012-06-21: qty 2

## 2012-06-21 MED ORDER — OXYCODONE-ACETAMINOPHEN 5-325 MG PO TABS
1.0000 | ORAL_TABLET | ORAL | Status: DC | PRN
  Administered 2012-06-21 – 2012-06-22 (×5): 2 via ORAL
  Filled 2012-06-21 (×5): qty 2

## 2012-06-21 MED ORDER — DIAZEPAM 5 MG PO TABS
5.0000 mg | ORAL_TABLET | Freq: Once | ORAL | Status: AC
  Administered 2012-06-21: 5 mg via ORAL

## 2012-06-21 MED ORDER — DOCUSATE SODIUM 100 MG PO CAPS
100.0000 mg | ORAL_CAPSULE | Freq: Two times a day (BID) | ORAL | Status: DC
  Administered 2012-06-21 – 2012-06-22 (×2): 100 mg via ORAL
  Filled 2012-06-21 (×2): qty 1

## 2012-06-21 MED ORDER — METHOCARBAMOL 500 MG PO TABS
500.0000 mg | ORAL_TABLET | Freq: Four times a day (QID) | ORAL | Status: DC | PRN
  Administered 2012-06-21 – 2012-06-22 (×3): 500 mg via ORAL
  Filled 2012-06-21 (×3): qty 1

## 2012-06-21 MED ORDER — METOCLOPRAMIDE HCL 5 MG/ML IJ SOLN: 5.0000 mg | Freq: Three times a day (TID) | INTRAMUSCULAR | Status: DC | PRN

## 2012-06-21 MED ORDER — PNEUMOCOCCAL VAC POLYVALENT 25 MCG/0.5ML IJ INJ
0.5000 mL | INJECTION | INTRAMUSCULAR | Status: AC
  Administered 2012-06-22: 0.5 mL via INTRAMUSCULAR
  Filled 2012-06-21: qty 0.5

## 2012-06-21 MED ORDER — ZOLPIDEM TARTRATE 5 MG PO TABS: 5.0000 mg | ORAL_TABLET | Freq: Every evening | ORAL | Status: DC | PRN

## 2012-06-21 MED ORDER — ASPIRIN 325 MG PO TABS: 325.0000 mg | ORAL_TABLET | Freq: Two times a day (BID) | ORAL | Status: DC

## 2012-06-21 MED ORDER — DIAZEPAM 5 MG PO TABS
ORAL_TABLET | ORAL | Status: AC
  Administered 2012-06-21: 5 mg via ORAL
  Filled 2012-06-21: qty 1

## 2012-06-21 MED ORDER — ONDANSETRON HCL 4 MG/2ML IJ SOLN: 4.0000 mg | Freq: Four times a day (QID) | INTRAMUSCULAR | Status: DC | PRN

## 2012-06-21 MED ORDER — HYDROMORPHONE HCL PF 1 MG/ML IJ SOLN
1.0000 mg | INTRAMUSCULAR | Status: DC | PRN
  Administered 2012-06-21 (×3): 1 mg via INTRAVENOUS
  Filled 2012-06-21 (×3): qty 1

## 2012-06-21 NOTE — ED Notes (Signed)
CT called to report pt in too much pain to tolerate exam, Dr Ignacia Palma advised and 2mg  diluadid ordered, dose and total amount of pain meds confirmed with MD prior to admin

## 2012-06-21 NOTE — ED Notes (Signed)
Called CT for test results.

## 2012-06-21 NOTE — ED Notes (Signed)
Transported to CT 
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
## 2012-06-21 NOTE — ED Notes (Signed)
Patient on nonrebreather placed by previous nurse patient responds by loud voice and touch on arm.  Left Pedal pulse +2 cap refill less then 3 seconds.

## 2012-06-21 NOTE — ED Notes (Signed)
Pt destaed briefly after admin of benadryl and valium, placed on NRB, sats back to 98%, responds to verbal and painful stimuli and has good resp effort

## 2012-06-21 NOTE — ED Notes (Signed)
Patient transported to X-ray 

## 2012-06-21 NOTE — ED Notes (Signed)
Per EMS: pt coming from home with c/o fall. Pt was putting up christmas lights and fell 8-10 feet, landing feet first. Pt c/o low back pain, and left ankle pain. Positive for distal pulses, CMS intact. Pt given 200 mcg of fentanyl en route. Pain 9/10. Denies head and neck pain, and LOC. A&Ox4, respirations equal and unlabored.

## 2012-06-21 NOTE — ED Notes (Signed)
Prior to admin of dilaudid 2mg , pt remained in severe pain, agitated, restless in bed

## 2012-06-21 NOTE — ED Notes (Signed)
Spoke with [facility] still is locked unable to place orders. [facility] still working on computer problem.

## 2012-06-22 NOTE — Progress Notes (Signed)
Pt discharged to home accompanied by family. Discharge instructions and rx given and explained and pt stated understanding. Pts IV was removed. Pt left unit in a stable condition via wheelchair. 

## 2012-06-22 NOTE — Progress Notes (Signed)
Utilization review completed. Divonte Senger, RN, BSN. 

## 2012-06-22 NOTE — Discharge Summary (Signed)
Patient ID: [name] MRN: [id number] DOB/AGE: 46/06/1966 46 y.o.  Admit date: 06/21/2012 Discharge date: 06/22/2012  Admission Diagnoses:  Principal Problem:  *Calcaneus fracture, left Active Problems:  Compression fracture of lumbar vertebra   Discharge Diagnoses:  Same  Past Medical History  Diagnosis Date  . Compression fracture of L1 lumbar vertebra 06/21/2012    <10%/notes 06/21/2012  . Kidney stones   . Calcaneal fracture 06/21/2012    left   . Fall from tree 06/21/2012    L1 <10% fracture; calcaneal frx left     Surgeries:  on * No surgery found *   Consultants:    Discharged Condition: Improved  Hospital Course: [name] is an 46 y.o. male who was admitted 06/21/2012 for non operative treatment ofCalcaneus fracture, left. Patient has severe unremitting pain that affects sleep, daily activities, and work/hobbies. He was admitted for ice and elevation and pain control. Refer to Dr. Lestine Box for eval in 1 week. He is currently nwb left leg.      Patient was given sequential compression devices, early ambulation, and chemoprophylaxis to prevent DVT.  Patient benefited maximally from hospital stay and there were no complications.    Recent vital signs: Patient Vitals for the past 24 hrs:  BP Temp Temp src Pulse Resp SpO2  06/22/12 0600 112/61 mmHg 99.2 F (37.3 C) - 75  18  97 %  06/21/12 1608 135/83 mmHg 99.6 F (37.6 C) Oral 90  18  -  06/21/12 1423 133/77 mmHg 99.2 F (37.3 C) Oral 65  22  98 %  06/21/12 1145 150/97 mmHg - - 86  12  97 %  06/21/12 1100 150/97 mmHg - - 82  16  98 %  06/21/12 1000 176/104 mmHg - - 95  16  100 %  06/21/12 0910 157/115 mmHg - - 92  14  100 %  06/21/12 0905 - - - 103  20  95 %     Recent laboratory studies:  Basename 06/21/12 0559  WBC 14.4*  HGB 15.7  HCT 44.9  PLT 191  NA 141  K 4.7  CL 107  CO2 21  BUN 17  CREATININE 1.33  GLUCOSE 99  INR --  CALCIUM 9.1     Discharge Medications:     Medication List      As of 06/22/2012  8:17 AM    TAKE these medications         aspirin 325 MG tablet   Take 1 tablet (325 mg total) by mouth 2 (two) times daily.      EPINEPHrine 0.3 mg/0.3 mL Devi   Commonly known as: EPI-PEN   Inject 0.3 mLs (0.3 mg total) into the muscle once.      methocarbamol 500 MG tablet   Commonly known as: ROBAXIN   Take 1 tablet (500 mg total) by mouth every 6 (six) hours as needed.      oxyCODONE-acetaminophen 5-325 MG per tablet   Commonly known as: PERCOCET/ROXICET   Take 1-2 tablets by mouth every 4 (four) hours as needed.        Diagnostic Studies: Dg Cervical Spine Complete  06/21/2012  *RADIOLOGY REPORT*  Clinical Data: Status post fall out of tree; posterior neck pain.  CERVICAL SPINE - COMPLETE 4+ VIEW  Comparison: None.  Findings: There is no evidence of fracture or subluxation. Vertebral bodies demonstrate normal height and alignment.  There is mild intervertebral disc space narrowing at multiple levels along the right mid  to lower cervical spine, with associated anterior and posterior disc osteophyte complexes.  Prevertebral soft tissues are within normal limits.  The provided odontoid view demonstrates no significant abnormality.  The visualized lung apices are clear.  IMPRESSION:  1.  No evidence of fracture or subluxation along the cervical spine. 2.  Mild degenerative change noted along the cervical spine.   Original Report Authenticated By: Tonia Ghent, M.D.    Dg Thoracic Spine W/swimmers  06/21/2012  *RADIOLOGY REPORT*  Clinical Data: Status post fall out of tree; upper back pain.  THORACIC SPINE - 2 VIEW + SWIMMERS  Comparison: Chest radiograph performed 01/16/2011  Findings: There is no evidence of acute fracture or subluxation.  A mild chronic compression deformity is noted at the mid thoracic spine.  Vertebral bodies otherwise demonstrate normal height and alignment.  Intervertebral disc spaces are preserved.  The visualized portions of both lungs are  clear.  The mediastinum is unremarkable in appearance.  IMPRESSION: No evidence of acute fracture or subluxation along the thoracic spine; stable appearance to mild chronic compression deformity at the mid thoracic spine.   Original Report Authenticated By: Tonia Ghent, M.D.    Dg Lumbar Spine Complete  06/21/2012  *RADIOLOGY REPORT*  Clinical Data: Status post fall out of tree; lower back pain.  LUMBAR SPINE - COMPLETE 4+ VIEW  Comparison: CT of the abdomen and pelvis performed 01/15/2011  Findings: There is mild acute compression fracture involving the superior endplate of L1, with 10-15% loss of height.  A small anterior osseous fragment is seen.  No definite retropulsion is appreciated.  Vertebral bodies demonstrate normal height and alignment.  There is narrowing of the intervertebral disc space at L5-S1, with associated vacuum phenomenon.  The visualized bowel gas pattern is unremarkable in appearance; air and stool are noted within the colon.  The sacroiliac joints are within normal limits.  IMPRESSION:  1.  Mild acute compression fracture involving the superior endplate of L1, with 10-15% loss of height.  Small anterior osseous fragment seen.  No definite retropulsion appreciated. 10.  Mild degenerative change at L5-S1.   Original Report Authenticated By: Tonia Ghent, M.D.    Dg Tibia/fibula Left  06/21/2012  *RADIOLOGY REPORT*  Clinical Data: Status post fall out of tree; left lower leg pain.  LEFT TIBIA AND FIBULA - 2 VIEW  Comparison: None.  Findings: There is no evidence of fracture or dislocation.  The tibia and fibula appear intact.  Visualized joint spaces are preserved.  No knee joint effusion is identified; the knee joint is grossly unremarkable in appearance.  A small ossicle is noted at the distal patellar tendon.  No significant soft tissue abnormalities are characterized on radiograph.  IMPRESSION:  1.  No evidence of fracture or dislocation. 2.  Small ossicle noted at the distal  patellar tendon.   Original Report Authenticated By: Tonia Ghent, M.D.    Dg Ankle Complete Left  06/21/2012  *RADIOLOGY REPORT*  Clinical Data: Status post fall out of tree; left ankle pain.  LEFT ANKLE COMPLETE - 3+ VIEW  Comparison: None.  Findings: There is a comminuted and displaced fracture involving the calcaneus, with a depressed tuberosity fragment; the appearance is compatible with a joint depression type fracture.  Associated soft tissue swelling is noted.  The ankle mortise is intact; the interosseous space is within normal limits.  No talar tilt or subluxation is seen.  The joint spaces are preserved.  Medial ankle soft tissue swelling is noted.  IMPRESSION: Comminuted and  displaced fracture involving the calcaneus, with a depressed tuberosity fragment; the appearance is compatible with a joint depression type fracture.  Associated soft tissue swelling noted.   Original Report Authenticated By: Tonia Ghent, M.D.    Ct Lumbar Spine Wo Contrast  06/21/2012  *RADIOLOGY REPORT*  Clinical Data: L1 compression fracture  CT LUMBAR SPINE WITHOUT CONTRAST  Technique:  Multidetector CT imaging of the lumbar spine was performed without intravenous contrast administration. Multiplanar CT image reconstructions were also generated.  Comparison: X-ray lumbar spine same day and CT scan 01/15/2011  Findings:  There is diffuse mild osteopenia.  Axial images shows mild wedge compression fracture anterior aspect superior endplate of the L1 vertebral body.  The height of posterior aspect of vertebral body is preserved. Spinal canal at this level is patent.  No significant paraspinal hematoma. Bilateral neural foramina are preserved.  There is disc space flattening with vacuum disc phenomenon and mild anterior and posterior spurring at L5-S1 level without change from prior exam.  The alignment is preserved.  There is mild anterior spurring upper endplate of the L4 and L5 vertebral body. Mild degenerative changes  bilateral SI joints.  IMPRESSION: 1.  Mild wedge compression fracture superior endplate of the L1 vertebral body.  No significant paraspinal hematoma.  The spinal canal is patent at this level.  Posterior elements appears intact. 2.  Stable degenerative changes at L5 S1 level. Diffuse mild osteopenia.   Original Report Authenticated By: Natasha Mead, M.D.    Dg Pelvis Portable  06/21/2012  *RADIOLOGY REPORT*  Clinical Data: Status post fall out of tree; back and sacral pain.  PORTABLE PELVIS  Comparison: CT of the abdomen and pelvis performed 01/15/2011  Findings: There is no evidence of fracture or dislocation.  Both femoral heads are seated normally within their respective acetabula.  No significant degenerative change is appreciated.  The sacroiliac joints are unremarkable in appearance.  The visualized bowel gas pattern is grossly unremarkable in appearance.  Mesh is noted overlying the right hemipelvis. Scattered vascular calcifications are seen.  IMPRESSION: No definite evidence of fracture or dislocation; the sacrum appears grossly intact.   Original Report Authenticated By: Tonia Ghent, M.D.    Ct Ankle Left Wo Contrast  06/21/2012  *RADIOLOGY REPORT*  Clinical Data: Calcaneus fracture.  CT OF THE LEFT ANKLE WITHOUT CONTRAST  Technique:  Multidetector CT imaging was performed according to the standard protocol. Multiplanar CT image reconstructions were also generated.  Comparison: Radiographs dated 06/21/2012  Findings: There is a severely comminuted impacted fracture of the calcaneus.  There are fractures which involve all three facets of the calcaneus.  The other bones of the hind foot are intact.  There is a tiny avulsion from the tip of the lateral malleolus which I suspect is nonacute.  There is no tendon entrapment of the fracture.  IMPRESSION: Severely comminuted impacted fracture of the calcaneus as described.   Original Report Authenticated By: Francene Boyers, M.D.    Dg Chest Port 1  View  06/21/2012  *RADIOLOGY REPORT*  Clinical Data: Status post fall; back pain.  PORTABLE CHEST - 1 VIEW  Comparison: Chest radiograph performed 01/16/2011  Findings: The lungs are relatively well-aerated.  Mild vascular congestion is noted.  There is no evidence of focal opacification, pleural effusion or pneumothorax.  The cardiomediastinal silhouette is within normal limits.  No acute osseous abnormalities are seen.  Mild chronic compression deformity at the mid to lower thoracic spine was better characterized on prior studies.  IMPRESSION: Mild  vascular congestion noted; lungs remain clear.  No displaced rib fractures seen.   Original Report Authenticated By: Tonia Ghent, M.D.     Disposition: 01-Home or Self Care      Discharge Orders    Future Orders Please Complete By Expires   Diet - low sodium heart healthy      Call MD / Call 911      Comments:   If you experience chest pain or shortness of breath, CALL 911 and be transported to the hospital emergency room.  If you develope a fever above 101 F, pus (white drainage) or increased drainage or redness at the wound, or calf pain, call your surgeon's office.   Constipation Prevention      Comments:   Drink plenty of fluids.  Prune juice may be helpful.  You may use a stool softener, such as Colace (over the counter) 100 mg twice a day.  Use MiraLax (over the counter) for constipation as needed.   Increase activity slowly as tolerated         Follow-up Information    Follow up with BEDNARZ,PAUL A, MD. Call in 5 days. (For left foot consult)    Contact information:   [address], [address] [place] [address] [phone]       Follow up with Velna Ochs, MD. In 12 days. (for lumbar spine pain)    Contact information:   [address] [place] [address] [phone]           Signed: Lindwood Qua R 06/22/2012, 8:17 AM

## 2012-06-22 NOTE — Evaluation (Signed)
Physical Therapy Evaluation Patient Details Name: [name] MRN: [id number] DOB: 1966/04/24 Today's Date: 06/22/2012 Time: 7829-5621 PT Time Calculation (min): 27 min  PT Assessment / Plan / Recommendation Clinical Impression  pt presents with L calcaneal fx and L1 comp fx.  pt very shaky and jittery, difficulty maintaining attention on task.  pt will need 24hr A at home and HHPT.  pt would benefit from RW and 3-in-1.      PT Assessment  Patient needs continued PT services    Follow Up Recommendations  Home health PT;Supervision/Assistance - 24 hour    Does the patient have the potential to tolerate intense rehabilitation      Barriers to Discharge None      Equipment Recommendations  Rolling walker with 5" wheels;3 in 1 bedside comode    Recommendations for Other Services OT consult   Frequency Min 5X/week    Precautions / Restrictions Precautions Precautions: Fall;Back Restrictions Weight Bearing Restrictions: Yes LLE Weight Bearing: Non weight bearing   Pertinent Vitals/Pain Indicates terrible pain.  Pt premedicated.        Mobility  Bed Mobility Bed Mobility: Supine to Sit;Sitting - Scoot to Edge of Bed Supine to Sit: 5: Supervision Sitting - Scoot to Edge of Bed: 5: Supervision Details for Bed Mobility Assistance: pt able to complete independently, but shaking and with difficulty maintaining attention.   Transfers Transfers: Sit to Stand;Stand to Sit Sit to Stand: 4: Min assist;With upper extremity assist;From bed Stand to Sit: 4: Min guard;With upper extremity assist;To chair/3-in-1;With armrests Details for Transfer Assistance: cues for safe technique and use of UEs.   Ambulation/Gait Ambulation/Gait Assistance: 4: Min guard Ambulation Distance (Feet): 15 Feet Assistive device: Rolling walker Ambulation/Gait Assistance Details: pt unsteady and shaking, again difficulty maintaining attention.  pt needs cues for safety.   Gait Pattern: Step-to  pattern;Trunk flexed Stairs: No Wheelchair Mobility Wheelchair Mobility: No    Shoulder Instructions     Exercises     PT Diagnosis: Difficulty walking;Acute pain  PT Problem List: Decreased activity tolerance;Decreased balance;Decreased mobility;Decreased knowledge of use of DME;Decreased knowledge of precautions;Pain PT Treatment Interventions: DME instruction;Gait training;Stair training;Functional mobility training;Therapeutic activities;Therapeutic exercise;Balance training;Patient/family education   PT Goals Acute Rehab PT Goals PT Goal Formulation: With patient Time For Goal Achievement: 06/29/12 Potential to Achieve Goals: Good Pt will go Supine/Side to Sit: with modified independence PT Goal: Supine/Side to Sit - Progress: Goal set today Pt will go Sit to Supine/Side: with modified independence PT Goal: Sit to Supine/Side - Progress: Goal set today Pt will go Sit to Stand: with modified independence PT Goal: Sit to Stand - Progress: Goal set today Pt will go Stand to Sit: with modified independence PT Goal: Stand to Sit - Progress: Goal set today Pt will Ambulate: >150 feet;with modified independence;with least restrictive assistive device PT Goal: Ambulate - Progress: Goal set today Pt will Go Up / Down Stairs: 3-5 stairs;with min assist;with least restrictive assistive device PT Goal: Up/Down Stairs - Progress: Goal set today  Visit Information  Last PT Received On: 06/22/12 Assistance Needed: +1    Subjective Data  Subjective: I just hurt all over.   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   Communication Communication: No difficulties    Cognition  Overall Cognitive Status: Appears within functional limits for tasks assessed/performed Arousal/Alertness: Awake/alert Orientation Level: Appears intact for tasks assessed Behavior During Session: Restless    Extremity/Trunk Assessment Right Lower Extremity Assessment RLE ROM/Strength/Tone: WFL for tasks assessed RLE Sensation: WFL - Light Touch Left Lower Extremity Assessment LLE ROM/Strength/Tone: Deficits LLE ROM/Strength/Tone Deficits: Limited by pain, but able to independently move LE against gravity.   LLE Sensation: WFL - Light Touch Trunk Assessment Trunk Assessment: Normal   Balance Balance Balance Assessed: No  End of Session PT - End of Session Equipment Utilized During Treatment: Gait belt Activity Tolerance: Patient limited by pain Patient left: in chair;with call bell/phone within reach Nurse Communication: Mobility status  GP     [name], [name] [phone] 06/22/2012, 10:31 AM

## 2012-06-22 NOTE — Progress Notes (Signed)
Subjective:    l calcaneus fx and l1 comp fx  Activity level:  nwb left Diet tolerance:  ok Voiding:  ok Patient reports pain as 4 on 0-10 scale.    Objective: Vital signs in last 24 hours: Temp:  [99.2 F (37.3 C)-99.6 F (37.6 C)] 99.2 F (37.3 C) (12/06 0600) Pulse Rate:  [65-103] 75  (12/06 0600) Resp:  [12-22] 18  (12/06 0600) BP: (112-176)/(61-115) 112/61 mmHg (12/06 0600) SpO2:  [95 %-100 %] 97 % (12/06 0600) FiO2 (%):  [100 %] 100 % (12/05 0905)  Labs:  Basename 06/21/12 0559  HGB 15.7    Basename 06/21/12 0559  WBC 14.4*  RBC 5.14  HCT 44.9  PLT 191    Basename 06/21/12 0559  NA 141  K 4.7  CL 107  CO2 21  BUN 17  CREATININE 1.33  GLUCOSE 99  CALCIUM 9.1   No results found for this basename: LABPT:2,INR:2 in the last 72 hours  Physical Exam:  Neurologically intact ABD soft Neurovascular intact Sensation intact distally Intact pulses distally No cellulitis present Compartment soft  Assessment/Plan:      Advance diet D/C IV fluids D/c home non weight bearing on left leg  ASA bid  Appt with Dr. Lestine Box in 1 week    Xiomara Sevillano R 06/22/2012, 8:12 AM

## 2012-06-24 ENCOUNTER — Emergency Department (HOSPITAL_COMMUNITY)
Admission: EM | Admit: 2012-06-24 | Discharge: 2012-06-24 | Disposition: A | Discharge status: Home / Self Care | Payer: Self-pay | Attending: Emergency Medicine | Admitting: Emergency Medicine

## 2012-06-24 ENCOUNTER — Encounter (HOSPITAL_COMMUNITY): Payer: Self-pay

## 2012-06-24 DIAGNOSIS — S92009A Unspecified fracture of unspecified calcaneus, initial encounter for closed fracture: Secondary | ICD-10-CM | POA: Insufficient documentation

## 2012-06-24 DIAGNOSIS — Z87442 Personal history of urinary calculi: Secondary | ICD-10-CM | POA: Insufficient documentation

## 2012-06-24 DIAGNOSIS — M79609 Pain in unspecified limb: Secondary | ICD-10-CM

## 2012-06-24 DIAGNOSIS — M79662 Pain in left lower leg: Secondary | ICD-10-CM

## 2012-06-24 DIAGNOSIS — Y929 Unspecified place or not applicable: Secondary | ICD-10-CM | POA: Insufficient documentation

## 2012-06-24 DIAGNOSIS — Y9389 Activity, other specified: Secondary | ICD-10-CM | POA: Insufficient documentation

## 2012-06-24 DIAGNOSIS — Z7982 Long term (current) use of aspirin: Secondary | ICD-10-CM | POA: Insufficient documentation

## 2012-06-24 DIAGNOSIS — F17200 Nicotine dependence, unspecified, uncomplicated: Secondary | ICD-10-CM | POA: Insufficient documentation

## 2012-06-24 DIAGNOSIS — S92002A Unspecified fracture of left calcaneus, initial encounter for closed fracture: Secondary | ICD-10-CM

## 2012-06-24 DIAGNOSIS — W1789XA Other fall from one level to another, initial encounter: Secondary | ICD-10-CM | POA: Insufficient documentation

## 2012-06-24 LAB — CBC WITH DIFFERENTIAL/PLATELET
Basophils Absolute: 0 10*3/uL (ref 0.0–0.1)
Basophils Relative: 0 % (ref 0–1)
Eosinophils Absolute: 0.5 10*3/uL (ref 0.0–0.7)
Eosinophils Relative: 6 % — ABNORMAL HIGH (ref 0–5)
HCT: 38.6 % — ABNORMAL LOW (ref 39.0–52.0)
Hemoglobin: 13.2 g/dL (ref 13.0–17.0)
Lymphocytes Relative: 25 % (ref 12–46)
Lymphs Abs: 2 10*3/uL (ref 0.7–4.0)
MCH: 29.7 pg (ref 26.0–34.0)
MCHC: 34.2 g/dL (ref 30.0–36.0)
MCV: 86.7 fL (ref 78.0–100.0)
Monocytes Absolute: 0.7 10*3/uL (ref 0.1–1.0)
Monocytes Relative: 9 % (ref 3–12)
Neutro Abs: 4.8 10*3/uL (ref 1.7–7.7)
Neutrophils Relative %: 61 % (ref 43–77)
Platelets: 195 10*3/uL (ref 150–400)
RBC: 4.45 MIL/uL (ref 4.22–5.81)
RDW: 13.1 % (ref 11.5–15.5)
WBC: 8 10*3/uL (ref 4.0–10.5)

## 2012-06-24 LAB — BASIC METABOLIC PANEL
BUN: 14 mg/dL (ref 6–23)
CO2: 25 mEq/L (ref 19–32)
Calcium: 9.1 mg/dL (ref 8.4–10.5)
Chloride: 101 mEq/L (ref 96–112)
Creatinine, Ser: 1.15 mg/dL (ref 0.50–1.35)
GFR calc Af Amer: 87 mL/min — ABNORMAL LOW (ref >90)
GFR calc non Af Amer: 75 mL/min — ABNORMAL LOW (ref >90)
Glucose, Bld: 96 mg/dL (ref 70–99)
Potassium: 3.9 mEq/L (ref 3.5–5.1)
Sodium: 136 mEq/L (ref 135–145)

## 2012-06-24 MED ORDER — HYDROMORPHONE HCL PF 1 MG/ML IJ SOLN
1.0000 mg | Freq: Once | INTRAMUSCULAR | Status: AC
  Administered 2012-06-24: 1 mg via INTRAVENOUS
  Filled 2012-06-24: qty 1

## 2012-06-24 MED ORDER — HYDROMORPHONE HCL 2 MG PO TABS
2.0000 mg | ORAL_TABLET | ORAL | Status: AC
  Administered 2012-06-24: 2 mg via ORAL
  Filled 2012-06-24: qty 1

## 2012-06-24 MED ORDER — ONDANSETRON HCL 4 MG/2ML IJ SOLN
4.0000 mg | Freq: Once | INTRAMUSCULAR | Status: AC
  Administered 2012-06-24: 4 mg via INTRAVENOUS
  Filled 2012-06-24: qty 2

## 2012-06-24 MED ORDER — HYDROMORPHONE HCL 2 MG PO TABS: 1.0000 mg | ORAL_TABLET | ORAL | Status: DC | PRN

## 2012-06-24 NOTE — ED Notes (Signed)
Vascular tech at bedside. °

## 2012-06-24 NOTE — ED Notes (Signed)
Patient was seen on 06/21/12 for an injury to the left heel. Patient was referred to Dr. Jerl Santos. Patient c/o increased swelling and pain. Patient called Dr. Jerl Santos and was told to come to the ED for further evaluation and treatment.

## 2012-06-24 NOTE — Progress Notes (Signed)
VASCULAR LAB PRELIMINARY  PRELIMINARY  PRELIMINARY  PRELIMINARY  Left lower extremity venous Doppler completed.    Preliminary report:  There is no DVT or SVT noted in the left lower extremity.  Jacklynn Dehaas, RVT 06/24/2012, 5:09 PM

## 2012-06-24 NOTE — ED Notes (Signed)
Patient given discharge instructions, information, prescriptions, and diet order. Patient states that they adequately understand discharge information given and to return to ED if symptoms return or worsen.     

## 2012-06-24 NOTE — ED Notes (Addendum)
Bedside report received from previous RN. Ortho Tech at bedside.

## 2012-06-24 NOTE — ED Notes (Signed)
RN to obtain labs with start of IV 
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
## 2012-06-27 ENCOUNTER — Emergency Department (HOSPITAL_COMMUNITY)
Admission: EM | Admit: 2012-06-27 | Discharge: 2012-06-28 | Disposition: A | Discharge status: Home / Self Care | Payer: Self-pay | Attending: Emergency Medicine | Admitting: Emergency Medicine

## 2012-06-27 ENCOUNTER — Encounter (HOSPITAL_COMMUNITY): Payer: Self-pay | Admitting: Emergency Medicine

## 2012-06-27 DIAGNOSIS — S91309A Unspecified open wound, unspecified foot, initial encounter: Secondary | ICD-10-CM | POA: Insufficient documentation

## 2012-06-27 DIAGNOSIS — R209 Unspecified disturbances of skin sensation: Secondary | ICD-10-CM | POA: Insufficient documentation

## 2012-06-27 DIAGNOSIS — Z7982 Long term (current) use of aspirin: Secondary | ICD-10-CM | POA: Insufficient documentation

## 2012-06-27 DIAGNOSIS — M79609 Pain in unspecified limb: Secondary | ICD-10-CM | POA: Insufficient documentation

## 2012-06-27 DIAGNOSIS — S92009A Unspecified fracture of unspecified calcaneus, initial encounter for closed fracture: Secondary | ICD-10-CM | POA: Insufficient documentation

## 2012-06-27 DIAGNOSIS — Y9289 Other specified places as the place of occurrence of the external cause: Secondary | ICD-10-CM | POA: Insufficient documentation

## 2012-06-27 DIAGNOSIS — M79672 Pain in left foot: Secondary | ICD-10-CM

## 2012-06-27 DIAGNOSIS — Y9389 Activity, other specified: Secondary | ICD-10-CM | POA: Insufficient documentation

## 2012-06-27 DIAGNOSIS — W1789XA Other fall from one level to another, initial encounter: Secondary | ICD-10-CM | POA: Insufficient documentation

## 2012-06-27 DIAGNOSIS — G8911 Acute pain due to trauma: Secondary | ICD-10-CM | POA: Insufficient documentation

## 2012-06-27 DIAGNOSIS — S92002A Unspecified fracture of left calcaneus, initial encounter for closed fracture: Secondary | ICD-10-CM

## 2012-06-27 DIAGNOSIS — Z87442 Personal history of urinary calculi: Secondary | ICD-10-CM | POA: Insufficient documentation

## 2012-06-27 DIAGNOSIS — F17200 Nicotine dependence, unspecified, uncomplicated: Secondary | ICD-10-CM | POA: Insufficient documentation

## 2012-06-27 DIAGNOSIS — Z8781 Personal history of (healed) traumatic fracture: Secondary | ICD-10-CM | POA: Insufficient documentation

## 2012-06-27 MED ORDER — ONDANSETRON HCL 4 MG/2ML IJ SOLN
4.0000 mg | Freq: Once | INTRAMUSCULAR | Status: AC
  Administered 2012-06-27: 4 mg via INTRAVENOUS
  Filled 2012-06-27: qty 2

## 2012-06-27 MED ORDER — HYDROMORPHONE HCL PF 1 MG/ML IJ SOLN
1.0000 mg | Freq: Once | INTRAMUSCULAR | Status: AC
  Administered 2012-06-27: 1 mg via INTRAVENOUS
  Filled 2012-06-27: qty 1
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
## 2012-06-27 NOTE — ED Notes (Signed)
Pt presents to the ED with left leg pain. Pt was seen prior for what pt states is  "a crushed heel and back pain after I fell hanging Christmas lights."  Pt states" they re wrapped it on Sunday and I got two blisters on the inside of the foot which I think have popped."  Pt also states he fears that a bruise and knot on his mid thigh is a blood clot.

## 2012-06-28 NOTE — ED Provider Notes (Signed)
Left foot fracture, has had foot x-rayed and has had ultrasound of the leg to rule out deep venous thrombosis, he presents with worsening swelling and bruising of the left lower extremity. He has kept his leg elevated and has noticed gradual swelling and bruising spreading above his knee into his thigh. He denies fevers chills shortness of breath cough or chest pain.  On exam the patient has mild edema of the left lower extremity, significant swelling of the left foot and ankle with associated hemorrhagic bulla. All of his compartments are extremely soft, pulses are normal, capillary refill is normal and sensation is normal. The patient has been taking aspirin to help with DVT prophylaxis however this may be increasing his bleeding and swelling. He will have an ultrasound in the morning to rule out DVT at this time, however he will be encouraged to continue taking his pain medications and followup with orthopedics.   Medical screening examination/treatment/procedure(s) were conducted as a shared visit with non-physician practitioner(s) and myself.  I personally evaluated the patient during the encounter    Vida Roller, MD 06/28/12 [phone]

## 2012-07-24 ENCOUNTER — Ambulatory Visit: Payer: Self-pay | Attending: Orthopedic Surgery | Admitting: Physical Therapy

## 2012-07-24 DIAGNOSIS — IMO0001 Reserved for inherently not codable concepts without codable children: Secondary | ICD-10-CM | POA: Insufficient documentation

## 2012-07-24 DIAGNOSIS — M25579 Pain in unspecified ankle and joints of unspecified foot: Secondary | ICD-10-CM | POA: Insufficient documentation

## 2012-07-24 DIAGNOSIS — R262 Difficulty in walking, not elsewhere classified: Secondary | ICD-10-CM | POA: Insufficient documentation

## 2012-07-24 DIAGNOSIS — M6281 Muscle weakness (generalized): Secondary | ICD-10-CM | POA: Insufficient documentation

## 2012-07-26 ENCOUNTER — Ambulatory Visit: Payer: Self-pay | Admitting: Physical Therapy

## 2012-07-30 ENCOUNTER — Encounter (HOSPITAL_COMMUNITY): Payer: Self-pay | Admitting: Pharmacy Technician

## 2012-07-30 ENCOUNTER — Encounter: Payer: Self-pay | Admitting: Physical Therapy

## 2012-08-01 ENCOUNTER — Encounter: Payer: Self-pay | Admitting: Physical Therapy

## 2012-08-02 ENCOUNTER — Other Ambulatory Visit (HOSPITAL_COMMUNITY): Payer: Self-pay

## 2012-08-06 ENCOUNTER — Encounter: Payer: Self-pay | Admitting: Physical Therapy

## 2012-08-08 ENCOUNTER — Encounter (HOSPITAL_COMMUNITY): Payer: Self-pay

## 2012-08-08 ENCOUNTER — Encounter (HOSPITAL_COMMUNITY)
Admission: RE | Admit: 2012-08-08 | Discharge: 2012-08-08 | Disposition: A | Discharge status: Home / Self Care | Payer: Self-pay | Source: Ambulatory Visit | Attending: Orthopedic Surgery | Admitting: Orthopedic Surgery

## 2012-08-08 LAB — URINALYSIS, ROUTINE W REFLEX MICROSCOPIC
Bilirubin Urine: NEGATIVE
Glucose, UA: NEGATIVE mg/dL
Hgb urine dipstick: NEGATIVE
Ketones, ur: NEGATIVE mg/dL
Leukocytes, UA: NEGATIVE
Nitrite: NEGATIVE
Protein, ur: NEGATIVE mg/dL
Specific Gravity, Urine: 1.031 — ABNORMAL HIGH (ref 1.005–1.030)
Urobilinogen, UA: 0.2 mg/dL (ref 0.0–1.0)
pH: 5 (ref 5.0–8.0)

## 2012-08-08 LAB — CBC WITH DIFFERENTIAL/PLATELET
Basophils Absolute: 0 10*3/uL (ref 0.0–0.1)
Basophils Relative: 0 % (ref 0–1)
Eosinophils Absolute: 0.5 10*3/uL (ref 0.0–0.7)
Eosinophils Relative: 6 % — ABNORMAL HIGH (ref 0–5)
HCT: 45.5 % (ref 39.0–52.0)
Hemoglobin: 15.8 g/dL (ref 13.0–17.0)
Lymphocytes Relative: 37 % (ref 12–46)
Lymphs Abs: 3 10*3/uL (ref 0.7–4.0)
MCH: 30.3 pg (ref 26.0–34.0)
MCHC: 34.7 g/dL (ref 30.0–36.0)
MCV: 87.3 fL (ref 78.0–100.0)
Monocytes Absolute: 0.6 10*3/uL (ref 0.1–1.0)
Monocytes Relative: 8 % (ref 3–12)
Neutro Abs: 4 10*3/uL (ref 1.7–7.7)
Neutrophils Relative %: 50 % (ref 43–77)
Platelets: 230 10*3/uL (ref 150–400)
RBC: 5.21 MIL/uL (ref 4.22–5.81)
RDW: 13.2 % (ref 11.5–15.5)
WBC: 8.1 10*3/uL (ref 4.0–10.5)

## 2012-08-08 LAB — SURGICAL PCR SCREEN
MRSA, PCR: NEGATIVE
Staphylococcus aureus: NEGATIVE

## 2012-08-08 LAB — COMPREHENSIVE METABOLIC PANEL
ALT: 30 U/L (ref 0–53)
AST: 25 U/L (ref 0–37)
Albumin: 4 g/dL (ref 3.5–5.2)
Alkaline Phosphatase: 123 U/L — ABNORMAL HIGH (ref 39–117)
BUN: 18 mg/dL (ref 6–23)
CO2: 26 mEq/L (ref 19–32)
Calcium: 9.5 mg/dL (ref 8.4–10.5)
Chloride: 104 mEq/L (ref 96–112)
Creatinine, Ser: 1.1 mg/dL (ref 0.50–1.35)
GFR calc Af Amer: >90 mL/min (ref >90)
GFR calc non Af Amer: 79 mL/min — ABNORMAL LOW (ref >90)
Glucose, Bld: 91 mg/dL (ref 70–99)
Potassium: 4.4 mEq/L (ref 3.5–5.1)
Sodium: 139 mEq/L (ref 135–145)
Total Bilirubin: 0.3 mg/dL (ref 0.3–1.2)
Total Protein: 7.4 g/dL (ref 6.0–8.3)

## 2012-08-08 LAB — APTT: aPTT: 33 seconds (ref 24–37)

## 2012-08-08 LAB — TYPE AND SCREEN
ABO/RH(D): O POS
Antibody Screen: NEGATIVE

## 2012-08-08 LAB — PROTIME-INR
INR: 0.97 (ref 0.00–1.49)
Prothrombin Time: 12.8 seconds (ref 11.6–15.2)

## 2012-08-08 LAB — ABO/RH: ABO/RH(D): O POS

## 2012-08-08 MED ORDER — CHLORHEXIDINE GLUCONATE 4 % EX LIQD: 60.0000 mL | Freq: Once | CUTANEOUS | Status: DC

## 2012-08-08 NOTE — Pre-Procedure Instructions (Signed)
[name]  08/08/2012   Your procedure is scheduled on:  Aug 10, 2012  Report to [facility] Stay Center at 5:30 AM.  Call this number if you have problems the morning of surgery: [phone]   Remember:   Do not eat food or drink liquids after midnight.   Take these medicines the morning of surgery with A SIP OF WATER: pain pill as needed, methocarbamol(robaxin)   Do not wear jewelry, make-up or nail polish.  Do not wear lotions, powders, or perfumes. You may wear deodorant.  Do not shave 48 hours prior to surgery. Men may shave face and neck.  Do not bring valuables to the hospital.  Contacts, dentures or bridgework may not be worn into surgery.  Leave suitcase in the car. After surgery it may be brought to your room.  For patients admitted to the hospital, checkout time is 11:00 AM the day of  discharge.   Patients discharged the day of surgery will not be allowed to drive  home.  Name and phone number of your driver:   Special Instructions: Incentive Spirometry - Practice and bring it with you on the day of surgery. Shower using CHG 2 nights before surgery and the night before surgery.  If you shower the day of surgery use CHG.  Use special wash - you have one bottle of CHG for all showers.  You should use approximately 1/3 of the bottle for each shower.   Please read over the following fact sheets that you were given: Pain Booklet, Coughing and Deep Breathing, Blood Transfusion Information and Surgical Site Infection Prevention

## 2012-08-09 ENCOUNTER — Encounter: Payer: Self-pay | Admitting: Physical Therapy

## 2012-08-09 MED ORDER — VANCOMYCIN HCL IN DEXTROSE 1-5 GM/200ML-% IV SOLN
1000.0000 mg | INTRAVENOUS | Status: AC
  Administered 2012-08-10: 1000 mg via INTRAVENOUS
  Filled 2012-08-09: qty 200

## 2012-08-10 ENCOUNTER — Ambulatory Visit (HOSPITAL_COMMUNITY): Payer: Self-pay

## 2012-08-10 ENCOUNTER — Inpatient Hospital Stay (HOSPITAL_COMMUNITY)
Admission: RE | Admit: 2012-08-10 | Discharge: 2012-08-14 | DRG: 493 | Disposition: A | Discharge status: Home / Self Care | Payer: MEDICAID | Source: Ambulatory Visit | Attending: Orthopedic Surgery | Admitting: Orthopedic Surgery

## 2012-08-10 ENCOUNTER — Encounter (HOSPITAL_COMMUNITY): Payer: Self-pay | Admitting: Certified Registered Nurse Anesthetist

## 2012-08-10 ENCOUNTER — Encounter (HOSPITAL_COMMUNITY): Payer: Self-pay | Admitting: *Deleted

## 2012-08-10 ENCOUNTER — Ambulatory Visit (HOSPITAL_COMMUNITY): Payer: Self-pay | Admitting: Certified Registered Nurse Anesthetist

## 2012-08-10 ENCOUNTER — Encounter (HOSPITAL_COMMUNITY)
Admission: RE | Disposition: A | Discharge status: Home / Self Care | Payer: Self-pay | Source: Ambulatory Visit | Attending: Orthopedic Surgery

## 2012-08-10 ENCOUNTER — Inpatient Hospital Stay (HOSPITAL_COMMUNITY): Payer: Self-pay

## 2012-08-10 DIAGNOSIS — S92002A Unspecified fracture of left calcaneus, initial encounter for closed fracture: Secondary | ICD-10-CM | POA: Diagnosis present

## 2012-08-10 DIAGNOSIS — Z7902 Long term (current) use of antithrombotics/antiplatelets: Secondary | ICD-10-CM

## 2012-08-10 DIAGNOSIS — F329 Major depressive disorder, single episode, unspecified: Secondary | ICD-10-CM | POA: Diagnosis present

## 2012-08-10 DIAGNOSIS — F3289 Other specified depressive episodes: Secondary | ICD-10-CM | POA: Diagnosis present

## 2012-08-10 DIAGNOSIS — F331 Major depressive disorder, recurrent, moderate: Secondary | ICD-10-CM | POA: Diagnosis present

## 2012-08-10 DIAGNOSIS — G9050 Complex regional pain syndrome I, unspecified: Secondary | ICD-10-CM | POA: Diagnosis present

## 2012-08-10 DIAGNOSIS — W11XXXA Fall on and from ladder, initial encounter: Secondary | ICD-10-CM | POA: Diagnosis present

## 2012-08-10 DIAGNOSIS — G90529 Complex regional pain syndrome I of unspecified lower limb: Secondary | ICD-10-CM | POA: Diagnosis present

## 2012-08-10 DIAGNOSIS — Z01818 Encounter for other preprocedural examination: Secondary | ICD-10-CM

## 2012-08-10 DIAGNOSIS — F17200 Nicotine dependence, unspecified, uncomplicated: Secondary | ICD-10-CM | POA: Diagnosis present

## 2012-08-10 DIAGNOSIS — S92009A Unspecified fracture of unspecified calcaneus, initial encounter for closed fracture: Principal | ICD-10-CM | POA: Diagnosis present

## 2012-08-10 DIAGNOSIS — Z01810 Encounter for preprocedural cardiovascular examination: Secondary | ICD-10-CM

## 2012-08-10 DIAGNOSIS — IMO0002 Reserved for concepts with insufficient information to code with codable children: Secondary | ICD-10-CM | POA: Diagnosis present

## 2012-08-10 DIAGNOSIS — Z79899 Other long term (current) drug therapy: Secondary | ICD-10-CM

## 2012-08-10 HISTORY — PX: ORIF CALCANEOUS FRACTURE: SHX5030

## 2012-08-10 HISTORY — DX: Complex regional pain syndrome I, unspecified: G90.50

## 2012-08-10 HISTORY — PX: ANKLE FUSION: SHX881

## 2012-08-10 LAB — CBC
HCT: 37.9 % — ABNORMAL LOW (ref 39.0–52.0)
Hemoglobin: 13 g/dL (ref 13.0–17.0)
MCH: 30.6 pg (ref 26.0–34.0)
MCHC: 34.3 g/dL (ref 30.0–36.0)
MCV: 89.2 fL (ref 78.0–100.0)
Platelets: 185 10*3/uL (ref 150–400)
RBC: 4.25 MIL/uL (ref 4.22–5.81)
RDW: 13.5 % (ref 11.5–15.5)
WBC: 10.9 10*3/uL — ABNORMAL HIGH (ref 4.0–10.5)

## 2012-08-10 LAB — CREATININE, SERUM
Creatinine, Ser: 1.17 mg/dL (ref 0.50–1.35)
GFR calc Af Amer: 85 mL/min — ABNORMAL LOW (ref >90)
GFR calc non Af Amer: 73 mL/min — ABNORMAL LOW (ref >90)

## 2012-08-10 SURGERY — OPEN REDUCTION INTERNAL FIXATION (ORIF) CALCANEOUS FRACTURE
Anesthesia: General | Site: Leg Lower | Laterality: Left | Wound class: Clean

## 2012-08-10 MED ORDER — 0.9 % SODIUM CHLORIDE (POUR BTL) OPTIME
TOPICAL | Status: DC | PRN
  Administered 2012-08-10: 1000 mL

## 2012-08-10 MED ORDER — ROCURONIUM BROMIDE 100 MG/10ML IV SOLN
INTRAVENOUS | Status: DC | PRN
  Administered 2012-08-10: 50 mg via INTRAVENOUS

## 2012-08-10 MED ORDER — POTASSIUM CHLORIDE IN NACL 20-0.9 MEQ/L-% IV SOLN
INTRAVENOUS | Status: DC
  Administered 2012-08-11: 01:00:00 via INTRAVENOUS
  Filled 2012-08-10 (×4): qty 1000

## 2012-08-10 MED ORDER — VECURONIUM BROMIDE 10 MG IV SOLR
INTRAVENOUS | Status: DC | PRN
  Administered 2012-08-10 (×2): 2 mg via INTRAVENOUS
  Administered 2012-08-10: 1 mg via INTRAVENOUS
  Administered 2012-08-10: 2 mg via INTRAVENOUS

## 2012-08-10 MED ORDER — MIDAZOLAM HCL 2 MG/2ML IJ SOLN: 0.5000 mg | Freq: Once | INTRAMUSCULAR | Status: DC | PRN

## 2012-08-10 MED ORDER — SODIUM CHLORIDE 0.9 % IV SOLN
10.0000 mg | INTRAVENOUS | Status: DC | PRN
  Administered 2012-08-10: 25 ug/min via INTRAVENOUS

## 2012-08-10 MED ORDER — DEXTROSE 5 % IV SOLN
1000.0000 mg | Freq: Four times a day (QID) | INTRAVENOUS | Status: DC
  Filled 2012-08-10 (×3): qty 10

## 2012-08-10 MED ORDER — HYDROMORPHONE 0.3 MG/ML IV SOLN
INTRAVENOUS | Status: DC
  Administered 2012-08-10: 13:00:00 via INTRAVENOUS
  Administered 2012-08-10: 2.1 mg via INTRAVENOUS
  Administered 2012-08-11: 0.9 mg via INTRAVENOUS
  Administered 2012-08-11: 0.6 mg via INTRAVENOUS

## 2012-08-10 MED ORDER — PROMETHAZINE HCL 25 MG/ML IJ SOLN: 6.2500 mg | INTRAMUSCULAR | Status: DC | PRN

## 2012-08-10 MED ORDER — VANCOMYCIN HCL IN DEXTROSE 1-5 GM/200ML-% IV SOLN
1000.0000 mg | Freq: Two times a day (BID) | INTRAVENOUS | Status: AC
  Administered 2012-08-10 – 2012-08-11 (×2): 1000 mg via INTRAVENOUS
  Filled 2012-08-10 (×2): qty 200

## 2012-08-10 MED ORDER — HYDROMORPHONE BOLUS VIA INFUSION
INTRAVENOUS | Status: DC | PRN
  Administered 2012-08-10 (×2): 0.5 mg via INTRAVENOUS

## 2012-08-10 MED ORDER — ONDANSETRON HCL 4 MG/2ML IJ SOLN
INTRAMUSCULAR | Status: DC | PRN
  Administered 2012-08-10: 4 mg via INTRAVENOUS

## 2012-08-10 MED ORDER — BUPIVACAINE-EPINEPHRINE PF 0.5-1:200000 % IJ SOLN
INTRAMUSCULAR | Status: DC | PRN
  Administered 2012-08-10: 30 mL
  Administered 2012-08-10: 10 mL

## 2012-08-10 MED ORDER — PHENYLEPHRINE HCL 10 MG/ML IJ SOLN
INTRAMUSCULAR | Status: DC | PRN
  Administered 2012-08-10 (×3): 80 ug via INTRAVENOUS
  Administered 2012-08-10 (×2): 40 ug via INTRAVENOUS
  Administered 2012-08-10 (×3): 80 ug via INTRAVENOUS
  Administered 2012-08-10: 40 ug via INTRAVENOUS

## 2012-08-10 MED ORDER — HYDROMORPHONE HCL PF 1 MG/ML IJ SOLN: 0.2500 mg | INTRAMUSCULAR | Status: DC | PRN

## 2012-08-10 MED ORDER — BISACODYL 10 MG RE SUPP: 10.0000 mg | Freq: Every day | RECTAL | Status: DC | PRN

## 2012-08-10 MED ORDER — LACTATED RINGERS IV SOLN: INTRAVENOUS | Status: DC

## 2012-08-10 MED ORDER — NEOSTIGMINE METHYLSULFATE 1 MG/ML IJ SOLN
INTRAMUSCULAR | Status: DC | PRN
  Administered 2012-08-10: 4 mg via INTRAVENOUS

## 2012-08-10 MED ORDER — NALOXONE HCL 0.4 MG/ML IJ SOLN: 0.4000 mg | INTRAMUSCULAR | Status: DC | PRN

## 2012-08-10 MED ORDER — FENTANYL CITRATE 0.05 MG/ML IJ SOLN
INTRAMUSCULAR | Status: DC | PRN
  Administered 2012-08-10: 50 ug via INTRAVENOUS
  Administered 2012-08-10 (×3): 100 ug via INTRAVENOUS
  Administered 2012-08-10: 250 ug via INTRAVENOUS
  Administered 2012-08-10: 100 ug via INTRAVENOUS
  Administered 2012-08-10 (×3): 50 ug via INTRAVENOUS

## 2012-08-10 MED ORDER — METHOCARBAMOL 100 MG/ML IJ SOLN
500.0000 mg | Freq: Once | INTRAVENOUS | Status: AC
  Administered 2012-08-10: 500 mg via INTRAVENOUS
  Filled 2012-08-10: qty 5

## 2012-08-10 MED ORDER — DIPHENHYDRAMINE HCL 12.5 MG/5ML PO ELIX: 12.5000 mg | ORAL_SOLUTION | ORAL | Status: DC | PRN

## 2012-08-10 MED ORDER — METOCLOPRAMIDE HCL 10 MG PO TABS: 5.0000 mg | ORAL_TABLET | Freq: Three times a day (TID) | ORAL | Status: DC | PRN

## 2012-08-10 MED ORDER — SODIUM CHLORIDE 0.9 % IJ SOLN: 9.0000 mL | INTRAMUSCULAR | Status: DC | PRN

## 2012-08-10 MED ORDER — METHOCARBAMOL 500 MG PO TABS
1000.0000 mg | ORAL_TABLET | Freq: Four times a day (QID) | ORAL | Status: DC
  Administered 2012-08-11 – 2012-08-12 (×3): 1000 mg via ORAL
  Filled 2012-08-10 (×10): qty 2

## 2012-08-10 MED ORDER — POLYETHYLENE GLYCOL 3350 17 G PO PACK
17.0000 g | PACK | Freq: Every day | ORAL | Status: DC
  Administered 2012-08-11 – 2012-08-13 (×3): 17 g via ORAL
  Filled 2012-08-10 (×5): qty 1

## 2012-08-10 MED ORDER — OXYCODONE HCL 5 MG/5ML PO SOLN: 5.0000 mg | Freq: Once | ORAL | Status: DC | PRN

## 2012-08-10 MED ORDER — ONDANSETRON HCL 4 MG/2ML IJ SOLN: 4.0000 mg | Freq: Four times a day (QID) | INTRAMUSCULAR | Status: DC | PRN

## 2012-08-10 MED ORDER — MEPERIDINE HCL 25 MG/ML IJ SOLN: 6.2500 mg | INTRAMUSCULAR | Status: DC | PRN

## 2012-08-10 MED ORDER — METHOCARBAMOL 500 MG PO TABS: 1000.0000 mg | ORAL_TABLET | Freq: Four times a day (QID) | ORAL | Status: DC

## 2012-08-10 MED ORDER — DOCUSATE SODIUM 100 MG PO CAPS
100.0000 mg | ORAL_CAPSULE | Freq: Two times a day (BID) | ORAL | Status: DC
  Administered 2012-08-10 – 2012-08-13 (×6): 100 mg via ORAL
  Filled 2012-08-10 (×10): qty 1

## 2012-08-10 MED ORDER — MIDAZOLAM HCL 5 MG/5ML IJ SOLN
INTRAMUSCULAR | Status: DC | PRN
  Administered 2012-08-10: 2 mg via INTRAVENOUS

## 2012-08-10 MED ORDER — HYDROMORPHONE HCL PF 1 MG/ML IJ SOLN
0.5000 mg | INTRAMUSCULAR | Status: DC | PRN
  Administered 2012-08-10 – 2012-08-14 (×13): 1 mg via INTRAVENOUS
  Filled 2012-08-10 (×15): qty 1

## 2012-08-10 MED ORDER — OXYCODONE HCL 5 MG PO TABS: 5.0000 mg | ORAL_TABLET | Freq: Once | ORAL | Status: DC | PRN

## 2012-08-10 MED ORDER — MAGNESIUM CITRATE PO SOLN
1.0000 | Freq: Once | ORAL | Status: AC | PRN
  Filled 2012-08-10: qty 296

## 2012-08-10 MED ORDER — OXYCODONE HCL 5 MG PO TABS
5.0000 mg | ORAL_TABLET | ORAL | Status: DC | PRN
  Administered 2012-08-10: 20 mg via ORAL
  Administered 2012-08-11: 10 mg via ORAL
  Filled 2012-08-10 (×2): qty 4
  Filled 2012-08-10: qty 2

## 2012-08-10 MED ORDER — LACTATED RINGERS IV SOLN
INTRAVENOUS | Status: DC | PRN
  Administered 2012-08-10 (×4): via INTRAVENOUS

## 2012-08-10 MED ORDER — ACETAMINOPHEN 10 MG/ML IV SOLN
1000.0000 mg | Freq: Four times a day (QID) | INTRAVENOUS | Status: DC
  Administered 2012-08-10 – 2012-08-11 (×3): 1000 mg via INTRAVENOUS
  Filled 2012-08-10 (×4): qty 100

## 2012-08-10 MED ORDER — GABAPENTIN 300 MG PO CAPS
300.0000 mg | ORAL_CAPSULE | Freq: Three times a day (TID) | ORAL | Status: DC
  Administered 2012-08-10: 300 mg via ORAL
  Filled 2012-08-10 (×4): qty 1

## 2012-08-10 MED ORDER — ENOXAPARIN SODIUM 40 MG/0.4ML ~~LOC~~ SOLN
40.0000 mg | SUBCUTANEOUS | Status: DC
  Administered 2012-08-11 – 2012-08-12 (×2): 40 mg via SUBCUTANEOUS
  Filled 2012-08-10 (×4): qty 0.4

## 2012-08-10 MED ORDER — LIDOCAINE HCL (CARDIAC) 20 MG/ML IV SOLN
INTRAVENOUS | Status: DC | PRN
  Administered 2012-08-10: 25 mg via INTRAVENOUS

## 2012-08-10 MED ORDER — ONDANSETRON HCL 4 MG PO TABS: 4.0000 mg | ORAL_TABLET | Freq: Four times a day (QID) | ORAL | Status: DC | PRN

## 2012-08-10 MED ORDER — KETOROLAC TROMETHAMINE 0.5 % OP SOLN
1.0000 [drp] | Freq: Four times a day (QID) | OPHTHALMIC | Status: AC
  Administered 2012-08-10 – 2012-08-11 (×4): 1 [drp] via OPHTHALMIC
  Filled 2012-08-10: qty 3

## 2012-08-10 MED ORDER — DEXTROSE 5 % IV SOLN
INTRAVENOUS | Status: DC | PRN
  Administered 2012-08-10: 09:00:00 via INTRAVENOUS

## 2012-08-10 MED ORDER — HYDROXYZINE HCL 50 MG/ML IM SOLN
25.0000 mg | Freq: Four times a day (QID) | INTRAMUSCULAR | Status: DC | PRN
  Filled 2012-08-10: qty 1

## 2012-08-10 MED ORDER — METOCLOPRAMIDE HCL 5 MG/ML IJ SOLN: 5.0000 mg | Freq: Three times a day (TID) | INTRAMUSCULAR | Status: DC | PRN

## 2012-08-10 MED ORDER — PROPOFOL 10 MG/ML IV BOLUS
INTRAVENOUS | Status: DC | PRN
  Administered 2012-08-10: 200 mg via INTRAVENOUS

## 2012-08-10 MED ORDER — GLYCOPYRROLATE 0.2 MG/ML IJ SOLN
INTRAMUSCULAR | Status: DC | PRN
  Administered 2012-08-10: 0.6 mg via INTRAVENOUS

## 2012-08-10 MED ORDER — DIPHENHYDRAMINE HCL 12.5 MG/5ML PO ELIX: 12.5000 mg | ORAL_SOLUTION | Freq: Four times a day (QID) | ORAL | Status: DC | PRN

## 2012-08-10 MED ORDER — METHOCARBAMOL 100 MG/ML IJ SOLN
1000.0000 mg | Freq: Four times a day (QID) | INTRAVENOUS | Status: DC
  Administered 2012-08-10 – 2012-08-11 (×4): 1000 mg via INTRAVENOUS
  Filled 2012-08-10 (×9): qty 10

## 2012-08-10 MED ORDER — HYDROMORPHONE 0.3 MG/ML IV SOLN
INTRAVENOUS | Status: AC
  Filled 2012-08-10: qty 25

## 2012-08-10 MED ORDER — DIPHENHYDRAMINE HCL 50 MG/ML IJ SOLN: 12.5000 mg | Freq: Four times a day (QID) | INTRAMUSCULAR | Status: DC | PRN

## 2012-08-10 MED ORDER — ARTIFICIAL TEARS OP OINT
TOPICAL_OINTMENT | OPHTHALMIC | Status: DC | PRN
  Administered 2012-08-10: 1 via OPHTHALMIC

## 2012-08-10 SURGICAL SUPPLY — 80 items
BANDAGE ELASTIC 4 VELCRO ST LF (GAUZE/BANDAGES/DRESSINGS) ×1 IMPLANT
BANDAGE ELASTIC 6 VELCRO ST LF (GAUZE/BANDAGES/DRESSINGS) ×1 IMPLANT
BANDAGE ESMARK 6X9 LF (GAUZE/BANDAGES/DRESSINGS) ×1 IMPLANT
BANDAGE GAUZE ELAST BULKY 4 IN (GAUZE/BANDAGES/DRESSINGS) ×3 IMPLANT
BIT DRILL 2.5X2.75 QC CALB (BIT) ×1 IMPLANT
BLADE SURG 10 STRL SS (BLADE) ×2 IMPLANT
BLADE SURG 15 STRL LF DISP TIS (BLADE) ×2 IMPLANT
BLADE SURG 15 STRL SS (BLADE) ×4
BNDG CMPR 9X6 STRL LF SNTH (GAUZE/BANDAGES/DRESSINGS)
BNDG COHESIVE 4X5 TAN STRL (GAUZE/BANDAGES/DRESSINGS) ×1 IMPLANT
BNDG ESMARK 6X9 LF (GAUZE/BANDAGES/DRESSINGS)
BONE CHIP PRESERV 20CC (Bone Implant) ×1 IMPLANT
BRUSH SCRUB DISP (MISCELLANEOUS) ×4 IMPLANT
CLOTH BEACON ORANGE TIMEOUT ST (SAFETY) ×2 IMPLANT
COTTON STERILE ROLL (GAUZE/BANDAGES/DRESSINGS) ×1 IMPLANT
COVER MAYO STAND STRL (DRAPES) ×2 IMPLANT
COVER SURGICAL LIGHT HANDLE (MISCELLANEOUS) ×4 IMPLANT
CUFF TOURNIQUET SINGLE 34IN LL (TOURNIQUET CUFF) ×2 IMPLANT
DRAIN TLS ROUND 10FR (DRAIN) ×1 IMPLANT
DRAPE C-ARM 42X72 X-RAY (DRAPES) ×2 IMPLANT
DRAPE C-ARMOR (DRAPES) ×2 IMPLANT
DRAPE ORTHO SPLIT 77X108 STRL (DRAPES) ×2
DRAPE SURG ORHT 6 SPLT 77X108 (DRAPES) ×1 IMPLANT
DRAPE U-SHAPE 47X51 STRL (DRAPES) ×2 IMPLANT
DRSG ADAPTIC 3X8 NADH LF (GAUZE/BANDAGES/DRESSINGS) ×1 IMPLANT
DRSG EMULSION OIL 3X3 NADH (GAUZE/BANDAGES/DRESSINGS) ×1 IMPLANT
ELECT REM PT RETURN 9FT ADLT (ELECTROSURGICAL) ×2
ELECTRODE REM PT RTRN 9FT ADLT (ELECTROSURGICAL) ×1 IMPLANT
GLOVE BIO SURGEON STRL SZ7.5 (GLOVE) ×2 IMPLANT
GLOVE BIO SURGEON STRL SZ8 (GLOVE) ×2 IMPLANT
GLOVE BIOGEL PI IND STRL 7.5 (GLOVE) ×1 IMPLANT
GLOVE BIOGEL PI INDICATOR 7.5 (GLOVE) ×1
GOWN PREVENTION PLUS XLARGE (GOWN DISPOSABLE) ×2 IMPLANT
GOWN STRL NON-REIN LRG LVL3 (GOWN DISPOSABLE) ×4 IMPLANT
K-WIRE 1.6MM THD TROCAR TIP NS (WIRE) ×8
KIT BASIN OR (CUSTOM PROCEDURE TRAY) ×2 IMPLANT
KIT ROOM TURNOVER OR (KITS) ×2 IMPLANT
KWIRE 1.6MM THD TROCAR TIP NS (WIRE) IMPLANT
MANIFOLD NEPTUNE II (INSTRUMENTS) ×2 IMPLANT
NDL HYPO 21X1.5 SAFETY (NEEDLE) IMPLANT
NEEDLE HYPO 21X1.5 SAFETY (NEEDLE) IMPLANT
NS IRRIG 1000ML POUR BTL (IV SOLUTION) ×2 IMPLANT
PACK ORTHO EXTREMITY (CUSTOM PROCEDURE TRAY) ×2 IMPLANT
PAD ARMBOARD 7.5X6 YLW CONV (MISCELLANEOUS) ×4 IMPLANT
PAD CAST 4YDX4 CTTN HI CHSV (CAST SUPPLIES) ×2 IMPLANT
PADDING CAST COTTON 4X4 STRL (CAST SUPPLIES) ×2
PADDING CAST COTTON 6X4 STRL (CAST SUPPLIES) ×1 IMPLANT
PENCIL BUTTON HOLSTER BLD 10FT (ELECTRODE) ×2 IMPLANT
PIN THREADED GUIDE ACE (PIN) ×2 IMPLANT
PLATE ACE PERIMETER LRG (Plate) ×1 IMPLANT
SCREW CANN 6.5 60MM (Screw) ×1 IMPLANT
SCREW CANN 6.5 80MM (Screw) ×2 IMPLANT
SCREW CANN LG 6.5 FLT 80X22 (Screw) IMPLANT
SCREW CORT FT 32X3.5XNONLOCK (Screw) IMPLANT
SCREW CORTICAL 3.5MM  30MM (Screw) ×1 IMPLANT
SCREW CORTICAL 3.5MM  32MM (Screw) ×1 IMPLANT
SCREW CORTICAL 3.5MM  46MM (Screw) ×2 IMPLANT
SCREW CORTICAL 3.5MM 22MM (Screw) ×1 IMPLANT
SCREW CORTICAL 3.5MM 26MM (Screw) ×1 IMPLANT
SCREW CORTICAL 3.5MM 30MM (Screw) IMPLANT
SCREW CORTICAL 3.5MM 32MM (Screw) ×1 IMPLANT
SCREW CORTICAL 3.5MM 38MM (Screw) ×2 IMPLANT
SCREW CORTICAL 3.5MM 40MM (Screw) ×2 IMPLANT
SCREW CORTICAL 3.5MM 46MM (Screw) IMPLANT
SCREW CORTICAL 3.5MM 48MM (Screw) ×1 IMPLANT
SPONGE GAUZE 4X4 12PLY (GAUZE/BANDAGES/DRESSINGS) ×1 IMPLANT
SPONGE LAP 18X18 X RAY DECT (DISPOSABLE) ×6 IMPLANT
SPONGE SCRUB IODOPHOR (GAUZE/BANDAGES/DRESSINGS) ×2 IMPLANT
SUCTION FRAZIER TIP 10 FR DISP (SUCTIONS) ×2 IMPLANT
SUT ETHILON 3 0 PS 1 (SUTURE) ×4 IMPLANT
SUT VIC AB 2-0 CT3 27 (SUTURE) IMPLANT
SUT VIC AB 2-0 SH 18 (SUTURE) ×2 IMPLANT
SUT VIC AB 3-0 FS2 27 (SUTURE) ×2 IMPLANT
SYR CONTROL 10ML LL (SYRINGE) IMPLANT
SYSTEM CHEST DRAIN TLS 7FR (DRAIN) ×2 IMPLANT
TOWEL OR 17X24 6PK STRL BLUE (TOWEL DISPOSABLE) ×2 IMPLANT
TOWEL OR 17X26 10 PK STRL BLUE (TOWEL DISPOSABLE) ×4 IMPLANT
TUBE CONNECTING 12X1/4 (SUCTIONS) ×2 IMPLANT
UNDERPAD 30X30 INCONTINENT (UNDERPADS AND DIAPERS) ×2 IMPLANT
WATER STERILE IRR 1000ML POUR (IV SOLUTION) ×2 IMPLANT

## 2012-08-10 NOTE — Preoperative (Signed)
Beta Blockers   Reason not to administer Beta Blockers:Not Applicable 

## 2012-08-10 NOTE — Progress Notes (Signed)
Report given to kathy rn as caregiver 

## 2012-08-10 NOTE — Transfer of Care (Signed)
Immediate Anesthesia Transfer of Care Note  Patient: [name]  Procedure(s) Performed: Procedure(s) (LRB) with comments: OPEN REDUCTION INTERNAL FIXATION (ORIF) CALCANEOUS FRACTURE (Left) ARTHRODESIS ANKLE (Left) - Subtalar fusion   Patient Location: PACU  Anesthesia Type:General  Level of Consciousness: sedated  Airway & Oxygen Therapy: Patient Spontanous Breathing and Patient connected to face mask oxygen  Post-op Assessment: Report given to PACU RN and Post -op Vital signs reviewed and stable  Post vital signs: Reviewed and stable  Complications: No apparent anesthesia complications

## 2012-08-10 NOTE — Brief Op Note (Signed)
08/10/2012  12:10 PM  PATIENT:  [name]  47 y.o. male  PRE-OPERATIVE DIAGNOSIS:  left calcaneous fracture   POST-OPERATIVE DIAGNOSIS:  left severely comminuted calcaneous fracture  PROCEDURE:  Procedure(s) (LRB) with comments: OPEN REDUCTION INTERNAL FIXATION (ORIF) CALCANEOUS FRACTURE (Left) ARTHRODESIS ANKLE (Left) - Subtalar fusion   SURGEON:  Surgeon(s) and Role:    * Budd Palmer, MD - Primary  PHYSICIAN ASSISTANT: Montez Morita, [facility]   ANESTHESIA:   general plus sciatic block  EBL:  Total I/O In: 3220 [I.V.:3220] Out: 175 [Urine:125; Blood:50]  BLOOD ADMINISTERED:none  DRAINS: removed after closure   LOCAL MEDICATIONS USED:  MARCAINE    and NONE  SPECIMEN:  No Specimen  DISPOSITION OF SPECIMEN:  N/A  COUNTS:  YES  TOURNIQUET:   Total Tourniquet Time Documented: Thigh (Left) - 117 minutes  DICTATION: .Other Dictation: Dictation Number [id number]  PLAN OF CARE: Admit to inpatient   PATIENT DISPOSITION:  PACU - hemodynamically stable.   Delay start of Pharmacological VTE agent (>24hrs) due to surgical blood loss or risk of bleeding: no

## 2012-08-10 NOTE — Progress Notes (Signed)
Spoke with Dr. Sampson Goon , feels it could be a corneal abrasion. Will call floor and put in orders for the patient.
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
## 2012-08-10 NOTE — Progress Notes (Signed)
Dr. Sampson Goon called given report  on patient ,pt has  a PCa it has not been started pt had a block and has no pain, Etco2 42 will be on a sO2 sat 100 % in nsr , arouses when awkwened pt states he did not sleep last night

## 2012-08-10 NOTE — Anesthesia Preprocedure Evaluation (Addendum)
Anesthesia Evaluation  Patient identified by MRN, date of birth, ID band Patient awake    Reviewed: Allergy & Precautions, H&P , NPO status , Patient's Chart, lab work & pertinent test results  History of Anesthesia Complications Negative for: history of anesthetic complications  Airway Mallampati: II TM Distance: >3 FB     Dental  (+) Poor Dentition, Missing and Dental Advisory Given   Pulmonary Current Smoker,  breath sounds clear to auscultation  Pulmonary exam normal       Cardiovascular hypertension (never needed medicines), - Peripheral Vascular Disease Rhythm:Regular Rate:Normal  Pt not on medications.   Neuro/Psych Depression negative neurological ROS     GI/Hepatic Neg liver ROS, GERD-  Medicated and Controlled,  Endo/Other  negative endocrine ROS  Renal/GU Renal diseasenegative Renal ROS     Musculoskeletal negative musculoskeletal ROS (+)   Abdominal Normal abdominal exam  (+)   Peds  Hematology negative hematology ROS (+)   Anesthesia Other Findings   Reproductive/Obstetrics                          Anesthesia Physical Anesthesia Plan  ASA: II  Anesthesia Plan: General   Post-op Pain Management:    Induction: Intravenous  Airway Management Planned: Oral ETT  Additional Equipment:   Intra-op Plan:   Post-operative Plan: Extubation in OR  Informed Consent: I have reviewed the patients History and Physical, chart, labs and discussed the procedure including the risks, benefits and alternatives for the proposed anesthesia with the patient or authorized representative who has indicated his/her understanding and acceptance.   Dental advisory given  Plan Discussed with: CRNA and Surgeon  Anesthesia Plan Comments: (Plan routine monitors, GETA )       Anesthesia Quick Evaluation

## 2012-08-10 NOTE — Progress Notes (Signed)
Transferred pt to room 22 on [address] [place], pt easily arouses denies pain, states he is cold blanket put around head for warmth. Report given to Link Snuffer N when talking to pt he complained of right eye pain. Says it feels like something is in his eye. Pt had opened both eyes in PACU and never complained of any pain in right eye.   WILL call anesthesia and inform . Pt has some bloody drainage on arrival below ankle on arrival to room. Floor nurse will reinforce area

## 2012-08-10 NOTE — Anesthesia Postprocedure Evaluation (Signed)
  Anesthesia Post-op Note  Patient: [name]  Procedure(s) Performed: Procedure(s) (LRB) with comments: OPEN REDUCTION INTERNAL FIXATION (ORIF) CALCANEOUS FRACTURE (Left) ARTHRODESIS ANKLE (Left) - Subtalar fusion   Patient Location: PACU  Anesthesia Type:GA combined with regional for post-op pain  Level of Consciousness: sedated  Airway and Oxygen Therapy: Patient Spontanous Breathing and Patient connected to nasal cannula oxygen  Post-op Pain: none  Post-op Assessment: Post-op Vital signs reviewed, Patient's Cardiovascular Status Stable, Respiratory Function Stable, Patent Airway and No signs of Nausea or vomiting  Post-op Vital Signs: Reviewed and stable  Complications: No apparent anesthesia complications

## 2012-08-10 NOTE — Anesthesia Procedure Notes (Signed)
Anesthesia Regional Block:  Popliteal block  Pre-Anesthetic Checklist: ,, timeout performed, Correct Patient, Correct Site, Correct Laterality, Correct Procedure, Correct Position, site marked, Risks and benefits discussed,  Surgical consent,  Pre-op evaluation,  At surgeon's request and post-op pain management  Laterality: Left  Prep: chloraprep       Needles:  Injection technique: Single-shot  Needle Type: Stimulator Needle - 80     Needle Length:cm 9 cm Needle Gauge: 22 and 22 G    Additional Needles:  Procedures: nerve stimulator Popliteal block  Nerve Stimulator or Paresthesia:  Response: toe dorsiflexion, 0.45 mA, 0.1 ms,   Additional Responses:   Narrative:  Start time: 08/10/2012 8:02 AM End time: 08/10/2012 8:15 AM Injection made incrementally with aspirations every 5 mL.  Performed by: Personally  Anesthesiologist: Sandford Craze, MD  Additional Notes: Pt identified in Holding room.  Monitors applied. Working IV access confirmed. Sterile prep L lateral knee.  #22ga PNS to toe dorsiflexion twitch at 0.48mA threshold.  30cc 0.5% Bupivacaine with 1:200k epi injected incrementally after negative test dose.  Re-prep L prox, ant-medial tibia and 10cc 0.5% Bupivacaine with 1:200k epi injected locally subq for saphenous nerve supplementation.  Patient asymptomatic, VSS, no heme aspirated, tolerated well.   Sandford Craze, MD  Popliteal block

## 2012-08-10 NOTE — H&P (Signed)
I have seen and examined the patient. I agree with the findings above.  I discussed with the patient the risks and benefits of surgery, including the possibility of infection, nerve injury, vessel injury, wound breakdown, arthritis, symptomatic hardware, DVT/ PE, loss of motion, and need for further surgery among others.  He understood these risks and wished to proceed.   Budd Palmer, MD 08/10/2012

## 2012-08-11 LAB — BASIC METABOLIC PANEL
BUN: 18 mg/dL (ref 6–23)
CO2: 24 mEq/L (ref 19–32)
Calcium: 8.7 mg/dL (ref 8.4–10.5)
Chloride: 103 mEq/L (ref 96–112)
Creatinine, Ser: 1.52 mg/dL — ABNORMAL HIGH (ref 0.50–1.35)
GFR calc Af Amer: 62 mL/min — ABNORMAL LOW (ref >90)
GFR calc non Af Amer: 53 mL/min — ABNORMAL LOW (ref >90)
Glucose, Bld: 131 mg/dL — ABNORMAL HIGH (ref 70–99)
Potassium: 4 mEq/L (ref 3.5–5.1)
Sodium: 138 mEq/L (ref 135–145)

## 2012-08-11 LAB — CBC
HCT: 37.7 % — ABNORMAL LOW (ref 39.0–52.0)
Hemoglobin: 12.3 g/dL — ABNORMAL LOW (ref 13.0–17.0)
MCH: 29.4 pg (ref 26.0–34.0)
MCHC: 32.6 g/dL (ref 30.0–36.0)
MCV: 90 fL (ref 78.0–100.0)
Platelets: 199 10*3/uL (ref 150–400)
RBC: 4.19 MIL/uL — ABNORMAL LOW (ref 4.22–5.81)
RDW: 13.7 % (ref 11.5–15.5)
WBC: 9.1 10*3/uL (ref 4.0–10.5)

## 2012-08-11 MED ORDER — HYDROXYZINE HCL 50 MG/ML IM SOLN
50.0000 mg | Freq: Three times a day (TID) | INTRAMUSCULAR | Status: DC
  Administered 2012-08-11 – 2012-08-12 (×2): 50 mg via INTRAMUSCULAR
  Filled 2012-08-11 (×6): qty 1

## 2012-08-11 MED ORDER — DIPHENHYDRAMINE HCL 12.5 MG/5ML PO ELIX: 12.5000 mg | ORAL_SOLUTION | Freq: Four times a day (QID) | ORAL | Status: DC | PRN

## 2012-08-11 MED ORDER — ONDANSETRON HCL 4 MG/2ML IJ SOLN: 4.0000 mg | Freq: Four times a day (QID) | INTRAMUSCULAR | Status: DC | PRN

## 2012-08-11 MED ORDER — OXYCODONE HCL 5 MG PO TABS
5.0000 mg | ORAL_TABLET | Freq: Four times a day (QID) | ORAL | Status: DC | PRN
  Filled 2012-08-11: qty 3
  Filled 2012-08-11 (×5): qty 2

## 2012-08-11 MED ORDER — FENTANYL 10 MCG/ML IV SOLN
INTRAVENOUS | Status: DC
  Administered 2012-08-11: 10:00:00 via INTRAVENOUS
  Administered 2012-08-11: 200 ug via INTRAVENOUS
  Filled 2012-08-11: qty 50

## 2012-08-11 MED ORDER — GABAPENTIN 300 MG PO CAPS
600.0000 mg | ORAL_CAPSULE | Freq: Three times a day (TID) | ORAL | Status: DC
  Administered 2012-08-11 – 2012-08-13 (×9): 600 mg via ORAL
  Filled 2012-08-11 (×12): qty 2

## 2012-08-11 MED ORDER — DIPHENHYDRAMINE HCL 50 MG/ML IJ SOLN: 12.5000 mg | Freq: Four times a day (QID) | INTRAMUSCULAR | Status: DC | PRN

## 2012-08-11 MED ORDER — NALOXONE HCL 0.4 MG/ML IJ SOLN: 0.4000 mg | INTRAMUSCULAR | Status: DC | PRN

## 2012-08-11 MED ORDER — SODIUM CHLORIDE 0.9 % IJ SOLN: 9.0000 mL | INTRAMUSCULAR | Status: DC | PRN

## 2012-08-11 MED ORDER — POTASSIUM CHLORIDE IN NACL 20-0.9 MEQ/L-% IV SOLN
INTRAVENOUS | Status: DC
  Administered 2012-08-12: 20 mL via INTRAVENOUS
  Filled 2012-08-11: qty 1000

## 2012-08-11 MED ORDER — OXYCODONE HCL 5 MG PO TABS
5.0000 mg | ORAL_TABLET | ORAL | Status: DC | PRN
  Administered 2012-08-11 – 2012-08-12 (×4): 10 mg via ORAL
  Filled 2012-08-11 (×3): qty 2
  Filled 2012-08-11: qty 1

## 2012-08-11 MED ORDER — OXYCODONE-ACETAMINOPHEN 5-325 MG PO TABS
1.0000 | ORAL_TABLET | Freq: Four times a day (QID) | ORAL | Status: DC | PRN
  Administered 2012-08-11 – 2012-08-14 (×8): 2 via ORAL
  Filled 2012-08-11 (×10): qty 2

## 2012-08-11 MED ORDER — KETOROLAC TROMETHAMINE 30 MG/ML IJ SOLN
30.0000 mg | Freq: Three times a day (TID) | INTRAMUSCULAR | Status: DC | PRN
  Administered 2012-08-11 – 2012-08-12 (×3): 30 mg via INTRAVENOUS
  Filled 2012-08-11 (×3): qty 1

## 2012-08-11 NOTE — Evaluation (Signed)
Physical Therapy Evaluation Patient Details Name: [name] MRN: [id number] DOB: 1966-01-29 Today's Date: 08/11/2012 Time: 3244-0102 PT Time Calculation (min): 19 min  PT Assessment / Plan / Recommendation Clinical Impression  Pt admitted s/p fall in Dec from tree while hanging lights for mom with calcaneal fx with delayed surgery due to edema. Pt now s/p Left calcaneal ORIF and limited with mobility today by pain and muscle spasms. Pt very pleasant and willing to mobilize but unable to progress OOB today due to pt with LLE spasms causing full body jerking while sitting EOB and unsafe to transfer OOB. Pt should progress quickly once pain and spasms  under control. Pt will benefit from acute therapy to maximize mobility, safety and function to return home at discharge.     PT Assessment  Patient needs continued PT services    Follow Up Recommendations  No PT follow up (if pt able to meet goals before discharge)    Does the patient have the potential to tolerate intense rehabilitation      Barriers to Discharge None      Equipment Recommendations  None recommended by PT    Recommendations for Other Services     Frequency Min 5X/week    Precautions / Restrictions Precautions Precautions: Fall Restrictions LLE Weight Bearing: Non weight bearing   Pertinent Vitals/Pain 94% on 2L [place]      Mobility  Bed Mobility Bed Mobility: Supine to Sit;Sit to Supine;Scooting to HOB Supine to Sit: 4: Min assist;HOB elevated Sit to Supine: 4: Min assist;HOB flat Scooting to [facility]: 3: Mod assist Details for Bed Mobility Assistance: Pt able to pivot to EOB with assist for lines and movement of LLE as well as back onto bed with support of LLE. Pt able to bridge and push with RLE and pull with rails to scoot toward [facility] Transfers Transfers: Not assessed Ambulation/Gait Ambulation/Gait Assistance: Not tested (comment)    Shoulder Instructions     Exercises     PT Diagnosis: Difficulty  walking;Acute pain  PT Problem List: Decreased activity tolerance;Pain;Decreased mobility PT Treatment Interventions: Gait training;Stair training;Functional mobility training;Therapeutic activities;Therapeutic exercise;DME instruction;Patient/family education   PT Goals Acute Rehab PT Goals PT Goal Formulation: With patient Time For Goal Achievement: 08/18/12 Potential to Achieve Goals: Good Pt will go Supine/Side to Sit: with modified independence;with HOB 0 degrees PT Goal: Supine/Side to Sit - Progress: Goal set today Pt will go Sit to Supine/Side: with modified independence;with HOB 0 degrees PT Goal: Sit to Supine/Side - Progress: Goal set today Pt will go Sit to Stand: with modified independence PT Goal: Sit to Stand - Progress: Goal set today Pt will go Stand to Sit: with modified independence PT Goal: Stand to Sit - Progress: Goal set today Pt will Ambulate: >150 feet;with least restrictive assistive device PT Goal: Ambulate - Progress: Goal set today Pt will Go Up / Down Stairs: 1-2 stairs;with least restrictive assistive device PT Goal: Up/Down Stairs - Progress: Goal set today  Visit Information  Last PT Received On: 08/11/12 Assistance Needed: +2 (for safety initially)    Subjective Data  Subjective: I'll do whatever you want me to Patient Stated Goal: be able to get up and walk   Prior Functioning  Home Living Lives With: Other (Comment) (with mom since fall) Available Help at Discharge: Family Type of Home: House Home Access: Stairs to enter [employer] of Steps: 1 Home Layout: One level Bathroom Shower/Tub: Child psychotherapist: Standard Home Adaptive Equipment: Paediatric nurse  with back;Crutches Prior Function Level of Independence: Independent with assistive device(s) Able to Take Stairs?: Yes Driving: Yes Communication Communication: No difficulties    Cognition  Overall Cognitive Status: Impaired Area of Impairment:  Attention Arousal/Alertness: Awake/alert Orientation Level: Appears intact for tasks assessed Behavior During Session: [place] for tasks performed Current Attention Level: Sustained    Extremity/Trunk Assessment Right Upper Extremity Assessment RUE ROM/Strength/Tone: Within functional levels Left Upper Extremity Assessment LUE ROM/Strength/Tone: Within functional levels Right Lower Extremity Assessment RLE ROM/Strength/Tone: Within functional levels Left Lower Extremity Assessment LLE ROM/Strength/Tone: Deficits;Unable to fully assess;Due to pain LLE ROM/Strength/Tone Deficits: splint and post op pain Trunk Assessment Trunk Assessment: Normal   Balance Static Sitting Balance Static Sitting - Balance Support: Bilateral upper extremity supported;Feet supported Static Sitting - Level of Assistance: 4: Min assist Static Sitting - Comment/# of Minutes: 5  End of Session PT - End of Session Activity Tolerance: Patient limited by pain Patient left: in bed;with call bell/phone within reach Nurse Communication: Mobility status  GP     Toney Sang [facility] 08/11/2012, 11:34 AM  Delaney Meigs, PT [phone]

## 2012-08-11 NOTE — Op Note (Signed)
NAMEJERSON, FURUKAWA NO.:  [id number]  MEDICAL RECORD NO.:  [id number]  LOCATION:  5N22C                        FACILITY:  [facility]  PHYSICIAN:  Doralee Albino. Carola Frost, M.D. DATE OF BIRTH:  1965-09-25  DATE OF PROCEDURE:  08/10/2012 DATE OF DISCHARGE:                              OPERATIVE REPORT   PREOPERATIVE DIAGNOSIS:  Left Sanders IV calcaneus fracture.  POSTOPERATIVE DIAGNOSIS:  Left Sanders IV calcaneus fracture.  PROCEDURES: 1. ORIF of left calcaneus. 2. Subtalar arthrodesis.  SURGEON:  Doralee Albino. Carola Frost, M.D.  ASSISTANT:  Mearl Latin, [place]  ANESTHESIA:  Supplemented with sciatic nerve block, E. Jairo Ben, MD.  TOURNIQUET:  56 minutes.  ESTIMATED BLOOD LOSS:  50 mL.  COMPLICATIONS:  None.  DISPOSITION:  To PACU.  CONDITION:  Stable.  BRIEF SUMMARY AND INDICATION FOR PROCEDURE:  [name] is a 47 year old male, who fell from a height sustaining a severely impacted comminuted calcaneus fracture with soft tissue swelling and blisters and persistent medial wound problems.  The patient is a smoker and I discussed with him the inverted nature of his of Bohlers angle, which would result in anterior impingement.  The lateral wall blow out resulting in peroneal impingement and the subtalar comminution, which would lead to the subtalar arthritis.  We also discussed shortening of hindfoot varus.  I have recommended ORIF with subtalar fusion as this resulted in the best long-term function and reduces the need for subsequent surgery.  The patient understood this.  He did wish to proceed with the recommendation understand those risks to include, nonunion, nerve injury, vessel injury, pain, symptomatic hardware, DVT, PE, failure to alleviate all symptoms and multiple others.  BRIEF SUMMARY OF PROCEDURE:  Ms. Barbier was given preoperative antibiotics and taken the operating room and anesthesia was induced.  His left lower extremity was prepped and draped  in usual sterile fashion.  The leg was elevated and exsanguinated with an Esmarch bandage, and then a standard extensile lateral approach made with meticulous treatment of the soft tissue envelope.  There was severe deformity of the calcaneus and lots of partial callused and fibrosis.  This was carefully debrided.  The peroneal sling was released.  Peroneal was retracted anteriorly.  The lateral wall was removed with an osteotome.  There was severe destruction of the subtalar joint.  Portions were found and where possible the articular surface was removed.  A large bone block was salvaged that had been depressed and this was cleaned of cortical bone and rotated eventually to provide a subtalar bone block to restore appropriate height to the calcaneus.  I did not pursue much medial dissection as I did feel that the shortening and height could not safely be corrected given a soft tissue envelope, but that the heel architecture was sufficient with regard to length if I could restore valgus and appropriate inclination of the talus that this would optimize his function and reduce the potential for complications given the already extensive area requiring bone grafting.  A 20 mL of chips were used at lateral wall, underwent an osteoplasty as did the anterior process and then a K-wire run to the tuberosity to help restore  length, height, and hindfoot valgus.  My assistant controlled this as the plate was affixed and then two 6.5 partially threaded screws run from the tuberosity into the talar body.  Final images on ankle mortise, AP, Harris heel, and lateral views as well as a Broden showed appropriate restoration of height, alignment, Taylor inclination with acceptable placement of the hardware and length.  Wound was irrigated thoroughly. The tourniquet was deflated.  Hemostasis obtained, but in general soft tissues had been controlled on the way in and then layered closure with 2-0 Vicryl and  3-0 nylon using again careful technique to avoid any injury to the envelope.  Sterile and gently compressive dressing was applied.  A deep drain was placed.  However, during application of the splint this did dislodge and could not be replaced.  Given the compression and the time that had been placed as well as the deflation of the tourniquet, no additional sterile approach and re-attempt at placing another drain was made.  Montez Morita, PA-C did assist me throughout the procedure and was necessary for them.  PROGNOSIS:  Mr. Zambito will be nonweightbearing for the next 6 weeks, potentially if he continues to smoke or more, we will remove his splint in 10-14 days, and remove his sutures at 2-3 weeks.  Begin unrestricted flexion extension of the ankle once his sutures are removed, and then again weightbearing as above.  He remains at increased risk for complications given his smoking.     Doralee Albino. Carola Frost, M.D.     [facility]/MEDQ  D:  08/10/2012  T:  08/11/2012  Job:  [id number]

## 2012-08-11 NOTE — Progress Notes (Signed)
[facility] (OTS)  Subjective: 1 Day Post-Op Procedure(s) (LRB): OPEN REDUCTION INTERNAL FIXATION (ORIF) CALCANEOUS FRACTURE (Left) ARTHRODESIS ANKLE (Left)   Pt very uncomfortable this am Block wore off around 2130 yesterday Very difficult getting comfortable Tolerating diet No other issues other than pain (no surprised by this clinical course)  Objective: Current Vitals Blood pressure 113/74, pulse 71, temperature 99.1 F (37.3 C), temperature source Oral, resp. rate 12, SpO2 94.00%. Vital signs in last 24 hours: Temp:  [97.3 F (36.3 C)-99.1 F (37.3 C)] 99.1 F (37.3 C) (01/25 0649) Pulse Rate:  [68-97] 71  (01/25 0649) Resp:  [12-19] 12  (01/25 0649) BP: (112-147)/(63-92) 113/74 mmHg (01/25 0649) SpO2:  [2 %-100 %] 94 % (01/25 0649) FiO2 (%):  [2 %] 2 % (01/24 2242)  Intake/Output from previous day: 01/24 0701 - 01/25 0700 In: 3340 [P.O.:120; I.V.:3220] Out: 1225 [Urine:1175; Blood:50] Intake/Output      01/24 0701 - 01/25 0700 01/25 0701 - 01/26 0700   P.O. 120    I.V. 3220    Total Intake 3340    Urine 1175    Blood 50    Total Output 1225    Net +2115            LABS  Basename 08/11/12 0640 08/10/12 1728 08/08/12 1359  HGB 12.3* 13.0 15.8    Basename 08/11/12 0640 08/10/12 1728  WBC 9.1 10.9*  RBC 4.19* 4.25  HCT 37.7* 37.9*  PLT 199 185    Basename 08/11/12 0640 08/10/12 1728 08/08/12 1359  NA 138 -- 139  K 4.0 -- 4.4  CL 103 -- 104  CO2 24 -- 26  BUN 18 -- 18  CREATININE 1.52* 1.17 --  GLUCOSE 131* -- 91  CALCIUM 8.7 -- 9.5    Basename 08/08/12 1359  LABPT --  INR 0.97     Physical Exam  Gen: writhing in pain, appears uncomfortable Lungs: clear Cardiac:s1 and s2, reg Abd:+ BS, NT Ext:      Left Lower Extremity  Dressing/splint stable  EHL, FHL motor intact  DPN, SPN, TN sensation intact  + dp pulse  + swelling  Ext warm  Compartments of L foot stable   Assessment/Plan: 1 Day Post-Op Procedure(s)  (LRB): OPEN REDUCTION INTERNAL FIXATION (ORIF) CALCANEOUS FRACTURE (Left) ARTHRODESIS ANKLE (Left)  47 y/o male s/p ORIF L calcaneus and L subtalar fusion  1. ORIF L calcaneus and L subtalar fusion POD 1  NWB x 8 weeks  Aggressive ice and elevation above heart  PT/OT evals- therapy may be limited today, may need to wait until this pm   Maintain splint until outpatient f/u   2. Pain  Pt in significant pain  Adjusting pain meds to maximize relief  D/c'd dilaudid PCA  D/c'd IV tylenol   Short course of IV ketorolac- believe a 1 day course of ketorolac may benefit this pt in this particular case as it is good for bone pain  Restarted percocet 10/325 with breakthrough oxy IR  Trial low dose fentanyl pca  Increased neurontin dose to 600mg  po q8h  Continue with scheduled robaxin  3. DVT/PE prophylaxis  Lovenox 4. FEN  Diet as tolerated  KVO IVF  D/c foley 5. Activity  OOB as tolerated  Elevate L leg as much as possible  PT/OT consults  NWB L leg 6. Dispo  Hopeful for d/c home Monday  Mearl Latin, PA-C [facility] [phone] (P) 08/11/2012, 9:14 AM

## 2012-08-11 NOTE — Progress Notes (Signed)
Orthopedic Tech Progress Note Patient Details:  NAMARI BRETON 1966/07/09 [id number]  Patient ID: [name], male   DOB: 13-Nov-1965, 47 y.o.   MRN: [id number]   Shawnie Pons 08/11/2012, 5:19 PM Trapeze bar

## 2012-08-11 NOTE — Progress Notes (Signed)
OT Cancellation Note  Patient Details Name: [name] MRN: [id number] DOB: 05/14/1966   Cancelled Treatment:    Reason Eval/Treat Not Completed: Patient at procedure or test/ unavailable (in severe pain) (spoke with PT Maija OT to hold eval until 1/26 ) Pt with severe pain limiting participation  Lucile Shutters Pager: [phone]  08/11/2012, 1:11 PM

## 2012-08-12 LAB — BASIC METABOLIC PANEL
BUN: 22 mg/dL (ref 6–23)
CO2: 24 mEq/L (ref 19–32)
Calcium: 8.7 mg/dL (ref 8.4–10.5)
Chloride: 107 mEq/L (ref 96–112)
Creatinine, Ser: 1.21 mg/dL (ref 0.50–1.35)
GFR calc Af Amer: 81 mL/min — ABNORMAL LOW (ref >90)
GFR calc non Af Amer: 70 mL/min — ABNORMAL LOW (ref >90)
Glucose, Bld: 111 mg/dL — ABNORMAL HIGH (ref 70–99)
Potassium: 4.1 mEq/L (ref 3.5–5.1)
Sodium: 140 mEq/L (ref 135–145)

## 2012-08-12 LAB — CBC
HCT: 34.9 % — ABNORMAL LOW (ref 39.0–52.0)
Hemoglobin: 11.5 g/dL — ABNORMAL LOW (ref 13.0–17.0)
MCH: 29.9 pg (ref 26.0–34.0)
MCHC: 33 g/dL (ref 30.0–36.0)
MCV: 90.9 fL (ref 78.0–100.0)
Platelets: 155 10*3/uL (ref 150–400)
RBC: 3.84 MIL/uL — ABNORMAL LOW (ref 4.22–5.81)
RDW: 13.4 % (ref 11.5–15.5)
WBC: 7.3 10*3/uL (ref 4.0–10.5)

## 2012-08-12 MED ORDER — ALPRAZOLAM 0.25 MG PO TABS
0.2500 mg | ORAL_TABLET | Freq: Three times a day (TID) | ORAL | Status: DC | PRN
  Administered 2012-08-12 – 2012-08-14 (×6): 0.25 mg via ORAL
  Filled 2012-08-12 (×6): qty 1

## 2012-08-12 MED ORDER — ENOXAPARIN SODIUM 40 MG/0.4ML ~~LOC~~ SOLN
40.0000 mg | SUBCUTANEOUS | Status: DC
  Administered 2012-08-13: 40 mg via SUBCUTANEOUS
  Filled 2012-08-12 (×2): qty 0.4

## 2012-08-12 MED ORDER — OXYCODONE HCL 5 MG PO TABS
5.0000 mg | ORAL_TABLET | ORAL | Status: DC | PRN
  Administered 2012-08-12 – 2012-08-14 (×7): 10 mg via ORAL
  Filled 2012-08-12: qty 2

## 2012-08-12 NOTE — Progress Notes (Signed)
Physical Therapy Treatment Patient Details Name: TARVIS BLOSSOM MRN: [id number] DOB: 03-20-1966 Today's Date: 08/12/2012 Time: 2956-2130 PT Time Calculation (min): 28 min  PT Assessment / Plan / Recommendation Comments on Treatment Session  Pt able to make great progress today towards his goals. Increased gait distance and stair education completed. Pt very sleepy throughout session and rates his left leg pain as 8/10. Requested to go back to bed after session. To bed with cryo applied to left ankle. Despite pt's lethargy and pain, he moved well without balance issues today.           Follow Up Recommendations  No PT follow up           Equipment Recommendations  None recommended by PT       Frequency Min 5X/week   Plan Discharge plan remains appropriate;Frequency remains appropriate    Precautions / Restrictions Precautions Precautions: Fall Restrictions LLE Weight Bearing: Non weight bearing       Mobility  Bed Mobility Supine to Sit: 6: Modified independent (Device/Increase time);HOB flat;With rails Sit to Supine: 6: Modified independent (Device/Increase time);HOB flat Scooting to HOB: 6: Modified independent (Device/Increase time) Details for Bed Mobility Assistance: assist to maintain lines only, incr time needed with all mobility Transfers Transfers: Sit to Stand;Stand to Sit Sit to Stand: From bed;With upper extremity assist;From chair/3-in-1;5: Supervision Stand to Sit: To bed;With upper extremity assist;To chair/3-in-1;5: Supervision Details for Transfer Assistance: min cues for hand placement Ambulation/Gait Ambulation/Gait Assistance: 4: Min guard;5: Supervision Ambulation Distance (Feet): 200 Feet Assistive device: Crutches Ambulation/Gait Assistance Details: pt demo'd good and safe technique with crutches during long hall gait, cues for object negotiation while in rehab gym with pt bumping into obstacles x3 with no loss of balance. Maintains NWB with  mobility. Gait Pattern: Step-through pattern Gait velocity: Steady Stairs: Yes Stairs Assistance: 4: Min guard Stairs Assistance Details (indicate cue type and reason): pt demo'd safe technique and maintained NWB on left leg.  Stair Management Technique: With crutches;Step to pattern Number of Stairs: 2       PT Goals Acute Rehab PT Goals PT Goal: Supine/Side to Sit - Progress: Progressing toward goal PT Goal: Sit to Supine/Side - Progress: Progressing toward goal PT Goal: Sit to Stand - Progress: Progressing toward goal PT Goal: Stand to Sit - Progress: Progressing toward goal PT Goal: Ambulate - Progress: Met PT Goal: Up/Down Stairs - Progress: Met  Visit Information  Last PT Received On: 08/12/12 Assistance Needed: +1    Subjective Data  Subjective: "I'll do what you tell me to do"   Cognition  Overall Cognitive Status: Impaired Area of Impairment: Attention;Memory Arousal/Alertness: Lethargic Orientation Level: Appears intact for tasks assessed Behavior During Session: [facility] for tasks performed Attention - Other Comments: multiple cues to wake up upon arrival to room and to focus on conversation with therapist, once pt was up and moving he demo'd incr alertness and focus Memory Deficits: Decreased memory of recent events (does not recall therapy yesterday, or standing with nursing today to use urinal)       End of Session PT - End of Session Equipment Utilized During Treatment: Gait belt Activity Tolerance: Patient tolerated treatment well;Patient limited by pain Patient left: in bed;with call bell/phone within reach Nurse Communication: Mobility status;Patient requests pain meds   GP     Sallyanne Kuster 08/12/2012, 4:00 PM  Sallyanne Kuster, PTA Office- [phone]

## 2012-08-12 NOTE — Progress Notes (Signed)
Orthopedic Tech Progress Note Patient Details:  [name] 10-Jul-1966 [id number]  Ortho Devices Type of Ortho Device: CAM walker Ortho Device/Splint Location: DROPPED OFF CAM WALKER TO ROOM Ortho Device/Splint Interventions: Ordered   Shawnie Pons 08/12/2012, 12:23 PM

## 2012-08-12 NOTE — Evaluation (Signed)
Occupational Therapy Evaluation Patient Details Name: [name] MRN: [id number] DOB: 1965/12/05 Today's Date: 08/12/2012 Time: 5621-3086 OT Time Calculation (min): 13 min  OT Assessment / Plan / Recommendation Clinical Impression  Pt s/p left calcaneus ORIF (s/p calcaneal fx in decemeber).  Wlil benefit from continued OT to address below problem list in prep for return home.    OT Assessment  Patient needs continued OT Services    Follow Up Recommendations  No OT follow up;Supervision - Intermittent    Barriers to Discharge None    Equipment Recommendations  None recommended by OT    Recommendations for Other Services    Frequency  Min 2X/week    Precautions / Restrictions Precautions Precautions: Fall Restrictions Weight Bearing Restrictions: Yes LLE Weight Bearing: Non weight bearing   Pertinent Vitals/Pain See vitals    ADL  Eating/Feeding: Performed;Independent Where Assessed - Eating/Feeding: Edge of bed Grooming: Performed;Wash/dry face;Set up Where Assessed - Grooming: Unsupported sitting Lower Body Bathing: Simulated;Min guard Where Assessed - Lower Body Bathing: Supported sit to stand Lower Body Dressing: Simulated;Min guard Where Assessed - Lower Body Dressing: Supported sit to Pharmacist, hospital: Simulated;Min Pension scheme manager Method: Sit to Barista:  (bed) Equipment Used:  (crutches) Transfers/Ambulation Related to ADLs: min guard ambulating around bed ADL Comments: Pt very lethargic through session. Fatigued from ambulating with PT just prior to OT arrival.    OT Diagnosis: Acute pain;Generalized weakness  OT Problem List: Decreased activity tolerance;Pain;Decreased knowledge of use of DME or AE OT Treatment Interventions: Self-care/ADL training;DME and/or AE instruction;Therapeutic activities;Patient/family education   OT Goals Acute Rehab OT Goals OT Goal Formulation: With patient Time For Goal Achievement:  08/19/12 Potential to Achieve Goals: Good ADL Goals Pt Will Perform Lower Body Dressing: with modified independence;Sit to stand from chair;Sit to stand from bed ADL Goal: Lower Body Dressing - Progress: Goal set today Pt Will Transfer to Toilet: with modified independence;Ambulation;with DME;Comfort height toilet;Maintaining weight bearing status ADL Goal: Toilet Transfer - Progress: Goal set today Pt Will Perform Tub/Shower Transfer: Tub transfer;with modified independence;Ambulation;with DME;Transfer tub bench;Maintaining weight bearing status ADL Goal: Tub/Shower Transfer - Progress: Goal set today  Visit Information  Last OT Received On: 08/12/12 Assistance Needed: +1    Subjective Data      Prior Functioning     Home Living Lives With: Family (has been living with mother since fall) Available Help at Discharge: Family Type of Home: House Home Access: Stairs to enter Secretary/administrator of Steps: 1 Home Layout: One level Bathroom Shower/Tub: Child psychotherapist: Handicapped height Home Adaptive Equipment: Crutches;Tub transfer bench Prior Function Level of Independence: Independent with assistive device(s) Able to Take Stairs?: Yes Driving: Yes         Vision/Perception     Cognition  Overall Cognitive Status: Impaired Area of Impairment: Attention Arousal/Alertness: Lethargic Orientation Level: Appears intact for tasks assessed Behavior During Session: [facility] for tasks performed Attention - Other Comments: pt falling asleep sitting EOB; required multiple cues to stay awake    Extremity/Trunk Assessment Right Upper Extremity Assessment RUE ROM/Strength/Tone: Within functional levels Left Upper Extremity Assessment LUE ROM/Strength/Tone: Within functional levels     Mobility Bed Mobility Bed Mobility: Supine to Sit;Sitting - Scoot to Edge of Bed;Sit to Supine Supine to Sit: 6: Modified independent (Device/Increase time) Sitting -  Scoot to Edge of Bed: 6: Modified independent (Device/Increase time) Sit to Supine: 6: Modified independent (Device/Increase time) Scooting to [facility]: 6: Modified independent (Device/Increase time) Details  for Bed Mobility Assistance: assist to maintain lines only, incr time needed with all mobility Transfers Transfers: Sit to Stand;Stand to Sit Sit to Stand: 4: Min guard;From bed;With upper extremity assist Stand to Sit: 5: Supervision;To bed;With upper extremity assist Details for Transfer Assistance: min cues for hand placement     Shoulder Instructions     Exercise     Balance     End of Session OT - End of Session Equipment Utilized During Treatment: Gait belt Activity Tolerance: Patient limited by fatigue Patient left: in bed;with call bell/phone within reach  GO    08/12/2012 Cipriano Mile OTR/L Pager [phone] Office [phone]  Cipriano Mile 08/12/2012, 5:02 PM

## 2012-08-12 NOTE — Progress Notes (Signed)
[facility]opaedic [facility] (OTS)  Subjective: 2 Days Post-Op Procedure(s) (LRB): OPEN REDUCTION INTERNAL FIXATION (ORIF) CALCANEOUS FRACTURE (Left) ARTHRODESIS ANKLE (Left) Still with significant pain Was able to get out of bed to void Initially was refusing lovenox but is now agreeable to lovenox injection  Objective: Current Vitals Blood pressure 125/71, pulse 79, temperature 98.4 F (36.9 C), temperature source Oral, resp. rate 12, SpO2 98.00%. Vital signs in last 24 hours: Temp:  [98.4 F (36.9 C)-99.1 F (37.3 C)] 98.4 F (36.9 C) (01/26 0541) Pulse Rate:  [78-79] 79  (01/26 0541) Resp:  [12-13] 12  (01/26 0541) BP: (121-139)/(71-96) 125/71 mmHg (01/26 0541) SpO2:  [93 %-98 %] 98 % (01/26 0541)  Intake/Output from previous day: 01/25 0701 - 01/26 0700 In: 940 [P.O.:940] Out: 650 [Urine:650] Intake/Output      01/25 0701 - 01/26 0700 01/26 0701 - 01/27 0700   P.O. 940 240   I.V.     Total Intake 940 240   Urine 650 200   Blood     Total Output 650 200   Net +290 +40        Urine Occurrence 2 x      LABS  Basename 08/12/12 0620 08/11/12 0640 08/10/12 1728  HGB 11.5* 12.3* 13.0    Basename 08/12/12 0620 08/11/12 0640  WBC 7.3 9.1  RBC 3.84* 4.19*  HCT 34.9* 37.7*  PLT 155 199    Basename 08/12/12 0620 08/11/12 0640  NA 140 138  K 4.1 4.0  CL 107 103  CO2 24 24  BUN 22 18  CREATININE 1.21 1.52*  GLUCOSE 111* 131*  CALCIUM 8.7 8.7   No results found for this basename: LABPT:2,INR:2 in the last 72 hours   Physical Exam  ZOX:WRUEA, alert, mild distress. Less fidgeting today  Lungs:clear Cardiac:s1 and s2 Abd:+ BS, NT Ext:     Left Leg  Exam unchanged  Stable  Motor and sensory functions intact   Assessment/Plan: 2 Days Post-Op Procedure(s) (LRB): OPEN REDUCTION INTERNAL FIXATION (ORIF) CALCANEOUS FRACTURE (Left) ARTHRODESIS ANKLE (Left)   47 y/o male s/p ORIF L calcaneus and L subtalar fusion  1. ORIF L calcaneus and L subtalar  fusion POD 2             NWB x 8 weeks             Aggressive ice and elevation above heart             PT/OT              Maintain splint until outpatient f/u              2. Pain             Pt in significant pain             Adjusting pain meds to maximize relief  Pt did not tolerate fentanyl PCA as well as I would have liked, stopped it yesterday eventing                        encourage PO meds  Percocet 10/325 1-2 po q 6 h prn  Breakthrough oxy IR and dilaudid  D/c'd robaxin  Trial low dose xanax for anxiet/spasms- 0.25mg  po q8h prn  3. DVT/PE prophylaxis             Lovenox  No more lab draws for serum Cr. Stable today 4. FEN  Diet as tolerated             NSL IV            5. Activity             OOB as tolerated             Elevate L leg as much as possible             PT/OT              NWB L leg 6. Dispo             Hopeful for d/c home tomorrow?   Mearl Latin, PA-C [facility] [phone] (P) 08/12/2012, 10:08 AM

## 2012-08-13 ENCOUNTER — Encounter (HOSPITAL_COMMUNITY): Payer: Self-pay | Admitting: Orthopedic Surgery

## 2012-08-13 NOTE — Progress Notes (Signed)
Orthopaedic Trauma Service (OTS)  Subjective: 3 Days Post-Op Procedure(s) (LRB): OPEN REDUCTION INTERNAL FIXATION (ORIF) CALCANEOUS FRACTURE (Left) ARTHRODESIS ANKLE (Left)  Doing slightly better this am Still having pain in L foot Worked well with therapy yesterday. wallked down to therapy room Tolerating diet  Denies CP, no SOB No numbness or tingling No abd pain No lightheadedness or dizziness. No h/a  Objective: Current Vitals Blood pressure 109/81, pulse 72, temperature 97.8 F (36.6 C), temperature source Oral, resp. rate 18, SpO2 97.00%. Vital signs in last 24 hours: Temp:  [97.8 F (36.6 C)-100.2 F (37.9 C)] 97.8 F (36.6 C) (01/27 0652) Pulse Rate:  [72-85] 72  (01/27 0652) Resp:  [18] 18  (01/27 0652) BP: (109-150)/(70-81) 109/81 mmHg (01/27 0652) SpO2:  [97 %-98 %] 97 % (01/27 0652)  Intake/Output from previous day: 01/26 0701 - 01/27 0700 In: 1280 [P.O.:1280] Out: 1750 [Urine:1750] Intake/Output      01/26 0701 - 01/27 0700 01/27 0701 - 01/28 0700   P.O. 1280    Total Intake 1280    Urine 1750    Total Output 1750    Net -470           LABS  Basename 08/12/12 0620 08/11/12 0640 08/10/12 1728  HGB 11.5* 12.3* 13.0    Basename 08/12/12 0620 08/11/12 0640  WBC 7.3 9.1  RBC 3.84* 4.19*  HCT 34.9* 37.7*  PLT 155 199    Basename 08/12/12 0620 08/11/12 0640  NA 140 138  K 4.1 4.0  CL 107 103  CO2 24 24  BUN 22 18  CREATININE 1.21 1.52*  GLUCOSE 111* 131*  CALCIUM 8.7 8.7   No results found for this basename: LABPT:2,INR:2 in the last 72 hours    Physical Exam  Gen: appears more Lungs: clear Cardiac:s1 and s2, reg Abd:+ BS, NT Ext:      Left Lower Extremity             Dressing/splint stable             EHL, FHL motor intact             DPN, SPN, TN sensation intact             + dp pulse             + swelling             Ext warm             Compartments of L foot stable   Imaging No results  found.  Assessment/Plan: 3 Days Post-Op Procedure(s) (LRB): OPEN REDUCTION INTERNAL FIXATION (ORIF) CALCANEOUS FRACTURE (Left) ARTHRODESIS ANKLE (Left)  47 y/o male s/p ORIF L calcaneus and L subtalar fusion  1. ORIF L calcaneus and L subtalar fusion POD 3             NWB x 8 weeks             Aggressive ice and elevation above heart             PT/OT               Maintain splint until outpatient f/u              2. Pain             Pain doing better             xanax for anxiet/spasms- 0.25mg  po q8h prn, pt noticed improvement  Percocet 10/325  Breakthrough oxy IR    Minimize IV dilaudid use  3. DVT/PE prophylaxis             Lovenox- day 3/10  4. FEN             Diet as tolerated             NSL IV            5. Activity             OOB as tolerated             Elevate L leg as much as possible             PT/OT              NWB L leg 6. Dispo             Hopeful for d/c home tomorrow   Mearl Latin, PA-C [facility] [phone] (P) 08/13/2012, 8:28 AM

## 2012-08-13 NOTE — Progress Notes (Signed)
UR COMPLETED  

## 2012-08-13 NOTE — Progress Notes (Signed)
OT Cancellation Note  Patient Details Name: [name] MRN: [id number] DOB: 09-28-1965   Cancelled Treatment:     pt not appropriate for OT tx today due to fatigue/lethargy limiting participation. OT will re attempt tomorrow  Galen Manila 08/13/2012, 2:52 PM

## 2012-08-13 NOTE — Progress Notes (Signed)
CARE MANAGEMENT NOTE 08/13/2012  Patient:  [name], [name]   Account Number:  [id number]  Date Initiated:  08/13/2012  Documentation initiated by:  Vance Peper  Subjective/Objective Assessment:   47 yr old male s/p Left ankle ORIF     Action/Plan:   No Home Health needs Identified   Anticipated DC Date:  08/14/2012   Anticipated DC Plan:  HOME/SELF CARE      DC Planning Services  CM consult      Choice offered to / List presented to:             Status of service:  In process, will continue to follow Medicare Important Message given?   (If response is "NO", the following Medicare IM given date fields will be blank) Date Medicare IM given:   Date Additional Medicare IM given:    Discharge Disposition:  HOME/SELF CARE  Per UR Regulation:    If discussed at Long Length of Stay Meetings, dates discussed:    Comments:

## 2012-08-13 NOTE — Progress Notes (Signed)
PT Cancellation Note  Patient Details Name: [name] MRN: [id number] DOB: 1965-11-10   Cancelled Treatment:    Reason Eval/Treat Not Completed: Fatigue/lethargy limiting ability to participate. Patient unable to keep eyes open when trying to engage in conversation. Will attempt later as time allows    Fredrich Birks 08/13/2012, 11:44 AM

## 2012-08-14 ENCOUNTER — Encounter (HOSPITAL_COMMUNITY): Payer: Self-pay | Admitting: Orthopedic Surgery

## 2012-08-14 DIAGNOSIS — G9050 Complex regional pain syndrome I, unspecified: Secondary | ICD-10-CM

## 2012-08-14 HISTORY — DX: Complex regional pain syndrome I, unspecified: G90.50

## 2012-08-14 MED ORDER — DSS 100 MG PO CAPS: 100.0000 mg | ORAL_CAPSULE | Freq: Two times a day (BID) | ORAL | Status: DC

## 2012-08-14 MED ORDER — CYCLOBENZAPRINE HCL 10 MG PO TABS: 10.0000 mg | ORAL_TABLET | Freq: Three times a day (TID) | ORAL | Status: DC | PRN

## 2012-08-14 MED ORDER — ENOXAPARIN SODIUM 40 MG/0.4ML ~~LOC~~ SOLN: 40.0000 mg | SUBCUTANEOUS | Status: DC

## 2012-08-14 MED ORDER — TRAMADOL HCL 50 MG PO TABS: 50.0000 mg | ORAL_TABLET | Freq: Three times a day (TID) | ORAL | Status: DC | PRN

## 2012-08-14 MED ORDER — POLYETHYLENE GLYCOL 3350 17 G PO PACK: 17.0000 g | PACK | Freq: Every day | ORAL | Status: DC

## 2012-08-14 MED ORDER — OXYCODONE HCL 5 MG PO TABS: 5.0000 mg | ORAL_TABLET | Freq: Four times a day (QID) | ORAL | Status: DC | PRN

## 2012-08-14 MED ORDER — GABAPENTIN 300 MG PO CAPS: 600.0000 mg | ORAL_CAPSULE | Freq: Three times a day (TID) | ORAL | Status: DC

## 2012-08-14 MED ORDER — OXYCODONE-ACETAMINOPHEN 5-325 MG PO TABS: 1.0000 | ORAL_TABLET | Freq: Four times a day (QID) | ORAL | Status: DC | PRN

## 2012-08-14 NOTE — Progress Notes (Signed)
Occupational Therapy Treatment Patient Details Name: [name] MRN: [id number] DOB: [birth year] Today's Date: 08/14/2012 Time: 7846-9629 OT Time Calculation (min): 10 min  OT Assessment / Plan / Recommendation Comments on Treatment Session Pt doing well and making progress. Pt scheduled to d/c home this morning. No further OT services required at this time    Follow Up Recommendations  No OT follow up    Barriers to Discharge       Equipment Recommendations  None recommended by OT    Recommendations for Other Services    Frequency     Plan Discharge plan remains appropriate    Precautions / Restrictions Precautions Precautions: Fall Precaution Comments: cast L LE Restrictions Weight Bearing Restrictions: Yes LLE Weight Bearing: Non weight bearing   Pertinent Vitals/Pain     ADL  Tub/Shower Transfer: Performed;Supervision/safety Tub/Shower Transfer Method: Stand pivot Psychologist, educational: Emergency planning/management officer    OT Diagnosis:    OT Problem List:   OT Treatment Interventions:     OT Goals ADL Goals ADL Goal: Tub/Shower Transfer - Progress: Progressing toward goals  Visit Information  Last OT Received On: 08/14/12 Assistance Needed: +1    Subjective Data  Subjective: " I am going home today " Patient Stated Goal: To return home   Prior Functioning       Cognition  Overall Cognitive Status: Appears within functional limits for tasks assessed/performed Arousal/Alertness: Awake/alert Orientation Level: Appears intact for tasks assessed Behavior During Session: [facility] for tasks performed    Mobility  Shoulder Instructions Bed Mobility Supine to Sit: 7: Independent Sitting - Scoot to Edge of Bed: 6: Modified independent (Device/Increase time) Transfers Transfers: Sit to Stand;Stand to Sit Sit to Stand: 6: Modified independent (Device/Increase time) Stand to Sit: 6: Modified independent (Device/Increase time)       Exercises      Balance  Balance Balance Assessed: No   End of Session OT - End of Session Equipment Utilized During Treatment: Other (comment) (tub bench) Activity Tolerance: Patient tolerated treatment well Patient left: in chair;with call bell/phone within reach  GO     Galen Manila 08/14/2012, 9:56 AM

## 2012-08-14 NOTE — Progress Notes (Signed)
I have seen and examined the patient. I agree with the findings above.  Tjay Velazquez H, MD 08/14/2012 8:13 AM   

## 2012-08-14 NOTE — Progress Notes (Signed)
I have seen and examined the patient. I agree with the findings above.  Budd Palmer, MD 08/14/2012 8:13 AM

## 2012-08-14 NOTE — Progress Notes (Signed)
Physical Therapy Treatment Patient Details Name: [name] MRN: [id number] DOB: [birth year] Today's Date: 08/14/2012 Time: 4098-1191 PT Time Calculation (min): 12 min  PT Assessment / Plan / Recommendation Comments on Treatment Session  Pt alert and oriented this treatment. Completed stair training for safety as pt did not participate yesterday. Good demo with NWB ambulation and stairs.  Pt is eager to go home and somewhat impulsive. Required cueing to slow down and take his time for safety.     Follow Up Recommendations  No PT follow up     Does the patient have the potential to tolerate intense rehabilitation     Barriers to Discharge        Equipment Recommendations  None recommended by PT    Recommendations for Other Services    Frequency Min 5X/week   Plan Discharge plan remains appropriate;Frequency remains appropriate    Precautions / Restrictions Restrictions Weight Bearing Restrictions: Yes LLE Weight Bearing: Non weight bearing   Pertinent Vitals/Pain no apparent distress     Mobility  Bed Mobility Supine to Sit: 7: Independent Sitting - Scoot to Edge of Bed: 6: Modified independent (Device/Increase time) Transfers Sit to Stand: 6: Modified independent (Device/Increase time) Stand to Sit: 6: Modified independent (Device/Increase time) Ambulation/Gait Ambulation/Gait Assistance: 5: Supervision Ambulation Distance (Feet): 200 Feet Assistive device: Crutches Ambulation/Gait Assistance Details: Long hall ambulation with proper technique. cues to slow down for safety Gait Pattern: Step-to pattern Stairs Assistance: 4: Min guard Stairs Assistance Details (indicate cue type and reason): Cueing for crutch placement on steps for safety. One loss of balance on steps with turn around. Stair Management Technique: With crutches Number of Stairs: 2     Exercises     PT Diagnosis:    PT Problem List:   PT Treatment Interventions:     PT Goals Acute Rehab PT  Goals PT Goal: Supine/Side to Sit - Progress: Met PT Goal: Sit to Stand - Progress: Met PT Goal: Stand to Sit - Progress: Met PT Goal: Ambulate - Progress: Met PT Goal: Up/Down Stairs - Progress: Met  Visit Information  Last PT Received On: 08/14/12 Assistance Needed: +1    Subjective Data  Subjective: " Let's go for a walk. I am ready to go home"   Cognition  Overall Cognitive Status: Appears within functional limits for tasks assessed/performed Arousal/Alertness: Awake/alert Orientation Level: Appears intact for tasks assessed Behavior During Session: [facility] for tasks performed    Balance     End of Session PT - End of Session Equipment Utilized During Treatment: Gait belt Activity Tolerance: Patient tolerated treatment well Patient left: in chair;with call bell/phone within reach   GP     Lazaro Arms 08/14/2012, 9:19 AM

## 2012-08-14 NOTE — Progress Notes (Signed)
Seen and agreed 08/14/2012 Kaira Stringfield Elizabeth PTA [phone] pager [phone] office    

## 2012-08-14 NOTE — Progress Notes (Signed)
[facility]opaedic [facility]uma Service (OTS)  Subjective: 4 Days Post-Op Procedure(s) (LRB): OPEN REDUCTION INTERNAL FIXATION (ORIF) CALCANEOUS FRACTURE (Left) ARTHRODESIS ANKLE (Left) Patient reports pain as moderate.    Objective: Current Vitals Blood pressure 150/93, pulse 91, temperature 98 F (36.7 C), temperature source Oral, resp. rate 20, height 6' (1.829 m), weight 205 lb (92.987 kg), SpO2 100.00%. Vital signs in last 24 hours: Temp:  [97.8 F (36.6 C)-98.9 F (37.2 C)] 98 F (36.7 C) (01/28 0720) Pulse Rate:  [88-121] 91  (01/28 0720) Resp:  [20] 20  (01/28 0720) BP: (139-150)/(82-93) 150/93 mmHg (01/28 0720) SpO2:  [94 %-100 %] 100 % (01/28 0720) Weight:  [205 lb (92.987 kg)] 205 lb (92.987 kg) (01/27 1543)  Intake/Output from previous day: 01/27 0701 - 01/28 0700 In: 360 [P.O.:360] Out: -   LABS  Basename 08/12/12 0620  HGB 11.5*    Basename 08/12/12 0620  WBC 7.3  RBC 3.84*  HCT 34.9*  PLT 155    Basename 08/12/12 0620  NA 140  K 4.1  CL 107  CO2 24  BUN 22  CREATININE 1.21  GLUCOSE 111*  CALCIUM 8.7   No results found for this basename: LABPT:2,INR:2 in the last 72 hours  Physical Exam  Clearly in pain, receiving IV meds, scant drainage on dressing No change in sensation or motion; brisk CR  Imaging No results found.  Assessment/Plan: 4 Days Post-Op Procedure(s) (LRB): OPEN REDUCTION INTERNAL FIXATION (ORIF) CALCANEOUS FRACTURE (Left) ARTHRODESIS ANKLE (Left)  Per patient request, will allow d/c as pain control reported to be same at home as at hospital F/u Monday for splint removal and xfer into boot, rather than today because of pain Cont ice, elevation Will try Margretta Ditty, MD [facility] [phone] (p)  08/14/2012, 8:15 AM

## 2012-08-14 NOTE — Discharge Summary (Signed)
Orthopaedic Trauma Service (OTS)  Patient ID: [name] MRN: [id number] DOB/AGE: 01-16-1966 47 y.o.  Admit date: 08/10/2012 Discharge date: 08/14/2012  Admission Diagnoses: Left calcaneus fx/malunion RSD L foot Depression  Discharge Diagnoses:  Principal Problem:  *Calcaneus fracture, left Active Problems:  DEPRESSION  Malunion of fracture  RSD (reflex sympathetic dystrophy)   Procedures Performed: 08/10/2012 1. ORIF of left calcaneus. 2. Subtalar arthrodesis.   Discharged Condition: stable  Hospital Course:  Patient is a 47 year old Caucasian male well-known to the orthopedic trauma service. Patient sustained a calcaneus fracture to his left foot the first week of December 2013. Due to profound swelling we were unable to perform early ORIF. This clinical course was further complicated by the development of reflex sympathetic dystrophy. Patient did have some pain control issues. Again patient also developed a draining wound medially which further delayed surgical intervention. Patient did have 2 rounds of oral antibiotics. Finally after about 7 weeks patients soft tissue was stable enough to perform ORIF. Do to the severe comminution of his calcaneus with involvement of the subtalar joint we decided that open reduction internal fixation of the calcaneus along with the subtalar fusion was the best treatment option for the patient. Patient underwent the procedure described up above on 08/10/2012. Patient tolerated procedure well. After surgery he was transferred to the PACU for recovery from anesthesia and was transferred to the orthopedic floor for continued observation, pain control and to begin therapies. Patient did have a fairly protracted hospital course due to pain control issues. Patient did receive a preoperative block and this wore off approximately 2030 on postop night. Patient was also started on a Dilaudid PCA. On postop day #1 the patient was writhing in pain with  straight severe muscle spasms. And felt that his PCA was not working well. As such as changed him to a fentanyl PCA and increased his gabapentin to 600 mg by mouth every 8 hours. Later on that evening and a followup with the patient and he was quite somnolent on his PCA as such I've discontinued his sternal PCA. We continued prn IV Dilaudid. He was also started on Percocet and OxyIR. On postop day #2 patient was doing somewhat better he did participate with physical therapy. Foley was removed the day before and on postop day 2 he was able to void under his own power. Patient was tolerating diet very well on postop day 2. The patient was still having pretty significant muscle spasms. As such discontinued his Robaxin and started him on Xanax 0.25 mg every 8 hours prn. I did feel that there is a component of anxiety contributing to his pain as well. Patient continued to progress fairly well but on postoperative day #4 he was deemed stable for discharge to home. Patient was covered with Lovenox during his hospital stay for DVT and PE prophylaxis and was started on postoperative day #1. This will continue for 6 more days.  Consults: None  Significant Diagnostic Studies: none   Treatments: IV hydration, antibiotics: Ancef, analgesia: acetaminophen, Dilaudid PCA, Dilaudid IV breakthrough, Fentanyl PCA, Percocet, Oxy IR, anticoagulation: LMW heparin, therapies: PT, OT and RN and surgery: as above  Discharge Exam:      Orthopaedic Trauma Service (OTS)  Subjective: 47 Days Post-Op Procedure(s) (LRB): OPEN REDUCTION INTERNAL FIXATION (ORIF) CALCANEOUS FRACTURE (Left) ARTHRODESIS ANKLE (Left) Patient reports pain as moderate.    Objective: Current Vitals Blood pressure 150/93, pulse 91, temperature 98 F (36.7 C), temperature source Oral, resp.  rate 20, height 6' (1.829 m), weight 205 lb (92.987 kg), SpO2 100.00%. Vital signs in last 24 hours: Temp:  [97.8 F (36.6 C)-98.9 F (37.2 C)] 98 F (36.7 C)  (01/28 0720) Pulse Rate:  [88-121] 91  (01/28 0720) Resp:  [20] 20  (01/28 0720) BP: (139-150)/(82-93) 150/93 mmHg (01/28 0720) SpO2:  [94 %-100 %] 100 % (01/28 0720) Weight:  [205 lb (92.987 kg)] 205 lb (92.987 kg) (01/27 1543)  Intake/Output from previous day: 01/27 0701 - 01/28 0700 In: 360 [P.O.:360] Out: -   LABS  Basename  08/12/12 0620   HGB  11.5*     Basename  08/12/12 0620   WBC  7.3   RBC  3.84*   HCT  34.9*   PLT  155     Basename  08/12/12 0620   NA  140   K  4.1   CL  107   CO2  24   BUN  22   CREATININE  1.21   GLUCOSE  111*   CALCIUM  8.7    No results found for this basename: LABPT:2,INR:2 in the last 72 hours  Physical Exam  Clearly in pain, receiving IV meds, scant drainage on dressing No change in sensation or motion; brisk CR  Imaging No results found.  Assessment/Plan: 47 Days Post-Op Procedure(s) (LRB): OPEN REDUCTION INTERNAL FIXATION (ORIF) CALCANEOUS FRACTURE (Left) ARTHRODESIS ANKLE (Left)  Per patient request, will allow d/c as pain control reported to be same at home as at hospital F/u Monday for splint removal and xfer into boot, rather than today because of pain Cont ice, elevation Will try Margretta Ditty, MD [facility], [facility] [phone] (p)  08/14/2012, 8:15 AM   Disposition: 01-Home or Self Care      Discharge Orders    Future Orders Please Complete By Expires   Diet general      Call MD / Call 911      Comments:   If you experience chest pain or shortness of breath, CALL 911 and be transported to the hospital emergency room.  If you develope a fever above 101 F, pus (white drainage) or increased drainage or redness at the wound, or calf pain, call your surgeon's office.   Constipation Prevention      Comments:   Drink plenty of fluids.  Prune juice may be helpful.  You may use a stool softener, such as Colace (over the counter) 100 mg twice a day.  Use MiraLax (over the  counter) for constipation as needed.   Increase activity slowly as tolerated      Discharge instructions      Comments:   Orthopaedic Trauma Service Discharge Instructions   General Discharge Instructions  WEIGHT BEARING STATUS: Nonweightbearing Left Leg  RANGE OF MOTION/ACTIVITY: do not remove splint. Activity as tolerated while maintaining Nonweightbearing on Left Leg  You may shower but you have to cover your splint in a garbage bag or similar to prevent it from getting wet  Wound Care: DO NOT REMOVE SPLINT!!  Diet: as you were eating previously.  Can use over the counter stool softeners and bowel preparations, such as Miralax, to help with bowel movements.  Narcotics can be constipating.  Be sure to drink plenty of fluids  STOP SMOKING OR USING NICOTINE PRODUCTS!!!!  As discussed nicotine severely impairs your body's ability to heal surgical and traumatic wounds but also impairs bone healing.  Wounds and bone heal by forming microscopic blood vessels (  angiogenesis) and nicotine is a vasoconstrictor (essentially, shrinks blood vessels).  Therefore, if vasoconstriction occurs to these microscopic blood vessels they essentially disappear and are unable to deliver necessary nutrients to the healing tissue.  This is one modifiable factor that you can do to dramatically increase your chances of healing your injury.    (This means no smoking, no nicotine gum, patches, etc)  DO NOT USE NONSTEROIDAL ANTI-INFLAMMATORY DRUGS (NSAID'S)  Using products such as Advil (ibuprofen), Aleve (naproxen), Motrin (ibuprofen) for additional pain control during fracture healing can delay and/or prevent the healing response.  If you would like to take over the counter (OTC) medication, Tylenol (acetaminophen) is ok.  However, some narcotic medications that are given for pain control contain acetaminophen as well. Therefore, you should not exceed more than 4000 mg of tylenol in a day if you do not have liver  disease.  Also note that there are may OTC medicines, such as cold medicines and allergy medicines that my contain tylenol as well.  If you have any questions about medications and/or interactions please ask your doctor/PA or your pharmacist.   PAIN MEDICATION USE AND EXPECTATIONS  You have likely been given narcotic medications to help control your pain.  After a traumatic event that results in an fracture (broken bone) with or without surgery, it is ok to use narcotic pain medications to help control one's pain.  We understand that everyone responds to pain differently and each individual patient will be evaluated on a regular basis for the continued need for narcotic medications. Ideally, narcotic medication use should last no more than 6-8 weeks (coinciding with fracture healing).   As a patient it is your responsibility as well to monitor narcotic medication use and report the amount and frequency you use these medications when you come to your office visit.   We would also advise that if you are using narcotic medications, you should take a dose prior to therapy to maximize you participation.  IF YOU ARE ON NARCOTIC MEDICATIONS IT IS NOT PERMISSIBLE TO OPERATE A MOTOR VEHICLE (MOTORCYCLE/CAR/TRUCK/MOPED) OR HEAVY MACHINERY DO NOT MIX NARCOTICS WITH OTHER CNS (CENTRAL NERVOUS SYSTEM) DEPRESSANTS SUCH AS ALCOHOL       ICE AND ELEVATE INJURED/OPERATIVE EXTREMITY  Using ice and elevating the injured extremity above your heart can help with swelling and pain control.  Icing in a pulsatile fashion, such as 20 minutes on and 20 minutes off, can be followed.    Do not place ice directly on skin. Make sure there is a barrier between to skin and the ice pack.    Using frozen items such as frozen peas works well as the conform nicely to the are that needs to be iced.  USE AN ACE WRAP OR TED HOSE FOR SWELLING CONTROL  In addition to icing and elevation, Ace wraps or TED hose are used to help limit and  resolve swelling.  It is recommended to use Ace wraps or TED hose until you are informed to stop.    When using Ace Wraps start the wrapping distally (farthest away from the body) and wrap proximally (closer to the body)   Example: If you had surgery on your leg or thing and you do not have a splint on, start the ace wrap at the toes and work your way up to the thigh        If you had surgery on your upper extremity and do not have a splint on, start the ace wrap  at your fingers and work your way up to the upper arm  IF YOU ARE IN A SPLINT OR CAST DO NOT REMOVE IT FOR ANY REASON   If your splint gets wet for any reason please contact the office immediately. You may shower in your splint or cast as long as you keep it dry.  This can be done by wrapping in a cast cover or garbage back (or similar)  Do Not stick any thing down your splint or cast such as pencils, money, or hangers to try and scratch yourself with.  If you feel itchy take benadryl as prescribed on the bottle for itching  IF YOU ARE IN A CAM BOOT (BLACK BOOT)  You may remove boot periodically. Perform daily dressing changes as noted below.  Wash the liner of the boot regularly and wear a sock when wearing the boot. It is recommended that you sleep in the boot until told otherwise  CALL THE OFFICE WITH ANY QUESTIONS OR CONCERTS: [phone]    Discharge Wound Care Instructions  Do NOT apply any ointments, solutions or lotions to pin sites or surgical wounds.  These prevent needed drainage and even though solutions like hydrogen peroxide kill bacteria, they also damage cells lining the pin sites that help fight infection.  Applying lotions or ointments can keep the wounds moist and can cause them to breakdown and open up as well. This can increase the risk for infection. When in doubt call the office.  Surgical incisions should be dressed daily.  If any drainage is noted, use one layer of adaptic, then gauze, Kerlix, and an ace  wrap.  Once the incision is completely dry and without drainage, it may be left open to air out.  Showering may begin 36-48 hours later.  Cleaning gently with soap and water.  Traumatic wounds should be dressed daily as well.    One layer of adaptic, gauze, Kerlix, then ace wrap.  The adaptic can be discontinued once the draining has ceased    If you have a wet to dry dressing: wet the gauze with saline the squeeze as much saline out so the gauze is moist (not soaking wet), place moistened gauze over wound, then place a dry gauze over the moist one, followed by Kerlix wrap, then ace wrap.   Non weight bearing      Driving restrictions      Comments:   No driving   Lifting restrictions      Comments:   No lifting       Medication List     As of 08/14/2012  8:42 AM    STOP taking these medications         cefadroxil 500 MG capsule   Commonly known as: DURICEF      HYDROmorphone 2 MG tablet   Commonly known as: DILAUDID      methocarbamol 500 MG tablet   Commonly known as: ROBAXIN      TAKE these medications         cyclobenzaprine 10 MG tablet   Commonly known as: FLEXERIL   Take 1 tablet (10 mg total) by mouth every 8 (eight) hours as needed for muscle spasms.      DSS 100 MG Caps   Take 100 mg by mouth 2 (two) times daily.      enoxaparin 40 MG/0.4ML injection   Commonly known as: LOVENOX   Inject 0.4 mLs (40 mg total) into the skin daily.  gabapentin 300 MG capsule   Commonly known as: NEURONTIN   Take 2 capsules (600 mg total) by mouth 3 (three) times daily.      oxyCODONE 5 MG immediate release tablet   Commonly known as: Oxy IR/ROXICODONE   Take 1-2 tablets (5-10 mg total) by mouth every 6 (six) hours as needed for pain (take with percocet to make percocet 10/325).      oxyCODONE-acetaminophen 5-325 MG per tablet   Commonly known as: PERCOCET/ROXICET   Take 1-2 tablets by mouth every 6 (six) hours as needed for pain.      polyethylene glycol packet    Commonly known as: MIRALAX / GLYCOLAX   Take 17 g by mouth daily.      SENNA-PLUS PO   Take 4 capsules by mouth 2 (two) times daily.      traMADol 50 MG tablet   Commonly known as: ULTRAM   Take 1-2 tablets (50-100 mg total) by mouth every 8 (eight) hours as needed for pain (breakthrough pain).        Follow-up Information    Follow up with Budd Palmer, MD. Schedule an appointment as soon as possible for a visit on 08/20/2012.   Contact information:   [address] [place] [address] [phone]          Discharge Instructions and Plan:  The patient sustained a very severe injury to his left calcaneus and subtalar joint. We were able to achieve excellent fixation with open reduction internal fixation along with subtalar fusion. We are able to restore height onto his calcaneus as well and hopefully this will restore proper gait mechanics. The patient is at elevated risk for the development of nonunion as well as wound breakdown as this is inherent to this particular approach. This is further exacerbated by the fact that the patient continues to smoke and is somewhat noncompliant with activity restrictions. Patient will remain on Lovenox for DVT and PE prophylaxis for the next 6 days. Patient will be nonweightbearing on his left leg for 8 weeks. After which time he will begin graduated weightbearing. Patient will remain in a splint until Monday at which time he'll followup in the office we'll remove her splint and place him in a cam boot so he may begin some ankle motion. The patient can resume regular diet Discharge instructions have been reviewed and are included in his discharge paperwork Pain control has been somewhat difficult with this patient but we feel that we have established a good regimen that will be effective.   Signed:  Mearl Latin, PA-C [facility] [phone] (P) 08/14/2012, 8:42 AM

## 2012-08-14 NOTE — Progress Notes (Signed)
Patient discharged. Completed discharge instructions with patient as well as prescription education. All questions answered. RN gave written instructions to patient, and filled out the last time her had pain medication as well.

## 2012-09-23 ENCOUNTER — Emergency Department (HOSPITAL_COMMUNITY)
Admission: EM | Admit: 2012-09-23 | Discharge: 2012-09-23 | Discharge status: Against Medical Advice | Payer: Self-pay | Attending: Emergency Medicine | Admitting: Emergency Medicine

## 2012-09-23 DIAGNOSIS — Y838 Other surgical procedures as the cause of abnormal reaction of the patient, or of later complication, without mention of misadventure at the time of the procedure: Secondary | ICD-10-CM | POA: Insufficient documentation

## 2012-09-23 DIAGNOSIS — T8140XA Infection following a procedure, unspecified, initial encounter: Secondary | ICD-10-CM | POA: Insufficient documentation

## 2012-09-23 NOTE — ED Notes (Addendum)
Pt states that he had surgery 01/24 and he went to a follow up with his MD and he had redness at the incision site. Pt states that the MD marked the area and then told him that if the redness went beyond the mark to get an antibiotic and then come get seen. Pt states today the redness increased beyond the mark, he took the antibioic and then he rested and he noticed that the redness is a streak that has gone up his calf. Pt spoke with MD on call with MD Handy's office.

## 2012-09-23 NOTE — ED Notes (Signed)
MD [facility] paged and returned call, aware pt here in E.D. States pt must see EDP to be seen.

## 2013-01-02 ENCOUNTER — Encounter (HOSPITAL_COMMUNITY): Payer: Self-pay | Admitting: Pharmacy Technician

## 2013-01-09 ENCOUNTER — Encounter (HOSPITAL_COMMUNITY): Payer: Self-pay

## 2013-01-09 ENCOUNTER — Encounter (HOSPITAL_COMMUNITY)
Admission: RE | Admit: 2013-01-09 | Discharge: 2013-01-09 | Disposition: A | Discharge status: Home / Self Care | Payer: 59 | Source: Ambulatory Visit | Attending: Orthopedic Surgery | Admitting: Orthopedic Surgery

## 2013-01-09 HISTORY — DX: Gastro-esophageal reflux disease without esophagitis: K21.9

## 2013-01-09 LAB — CBC
HCT: 44.7 % (ref 39.0–52.0)
Hemoglobin: 15.8 g/dL (ref 13.0–17.0)
MCH: 30 pg (ref 26.0–34.0)
MCHC: 35.3 g/dL (ref 30.0–36.0)
MCV: 85 fL (ref 78.0–100.0)
Platelets: 204 10*3/uL (ref 150–400)
RBC: 5.26 MIL/uL (ref 4.22–5.81)
RDW: 13.3 % (ref 11.5–15.5)
WBC: 8.5 10*3/uL (ref 4.0–10.5)

## 2013-01-09 LAB — SURGICAL PCR SCREEN
MRSA, PCR: NEGATIVE
Staphylococcus aureus: NEGATIVE

## 2013-01-09 LAB — COMPREHENSIVE METABOLIC PANEL
ALT: 20 U/L (ref 0–53)
AST: 17 U/L (ref 0–37)
Albumin: 3.7 g/dL (ref 3.5–5.2)
Alkaline Phosphatase: 157 U/L — ABNORMAL HIGH (ref 39–117)
BUN: 15 mg/dL (ref 6–23)
CO2: 25 mEq/L (ref 19–32)
Calcium: 9 mg/dL (ref 8.4–10.5)
Chloride: 102 mEq/L (ref 96–112)
Creatinine, Ser: 1.35 mg/dL (ref 0.50–1.35)
GFR calc Af Amer: 71 mL/min — ABNORMAL LOW (ref >90)
GFR calc non Af Amer: 61 mL/min — ABNORMAL LOW (ref >90)
Glucose, Bld: 90 mg/dL (ref 70–99)
Potassium: 4 mEq/L (ref 3.5–5.1)
Sodium: 138 mEq/L (ref 135–145)
Total Bilirubin: 0.3 mg/dL (ref 0.3–1.2)
Total Protein: 7 g/dL (ref 6.0–8.3)

## 2013-01-09 NOTE — Pre-Procedure Instructions (Signed)
[name]  01/09/2013  Your procedure is scheduled on: Thursday, January 10, 2013  Report to [facility] at 6:00 AM.  Call this number if you have problems the morning of surgery: [phone]   Remember:   Do not eat food or drink liquids after midnight: levofloxacin (LEVAQUIN) 500 MG tablet  Take these medicines the morning of surgery with A SIP OF WATER:  Stop taking Aspirin and herbal medications. Do not take any NSAIDs ie: Ibuprofen, Advil, Naproxen or any medication containing Aspirin  Do not wear jewelry, make-up or nail polish.  Do not wear lotions, powders, or perfumes. You may wear deodorant.  Do not shave 48 hours prior to surgery. Men may shave face and neck.  Do not bring valuables to the hospital.  [facility] is not responsible  for any belongings or valuables.  Contacts, dentures or bridgework may not be worn into surgery.  Leave suitcase in the car. After surgery it may be brought to your room.  For patients admitted to the hospital, checkout time is 11:00 AM the day of discharge.   Patients discharged the day of surgery will not be allowed to drive home.  Name and phone number of your driver:  Special Instructions: Shower using CHG 2 nights before surgery and the night before surgery.  If you shower the day of surgery use CHG.  Use special wash - you have one bottle of CHG for all showers.  You should use approximately 1/3 of the bottle for each shower.   Please read over the following fact sheets that you were given: Pain Booklet, Coughing and Deep Breathing, MRSA Information and Surgical Site Infection Prevention

## 2013-01-09 NOTE — Progress Notes (Signed)
Pt denies SOB, chest pain, and being under the care of a cardiologist. Pt suspects that he is hypertensive. Pt states," I have been checking it at home with my dads cuff and it has been high.."  Pt advised to seek medical attention regarding BP issues in addition, pt education on the consequences of having uncontrolled blood pressure.

## 2013-01-10 ENCOUNTER — Ambulatory Visit (HOSPITAL_COMMUNITY): Payer: 59 | Admitting: Anesthesiology

## 2013-01-10 ENCOUNTER — Encounter (HOSPITAL_COMMUNITY): Payer: Self-pay | Admitting: *Deleted

## 2013-01-10 ENCOUNTER — Encounter (HOSPITAL_COMMUNITY): Payer: Self-pay | Admitting: Anesthesiology

## 2013-01-10 ENCOUNTER — Ambulatory Visit (HOSPITAL_COMMUNITY)
Admission: RE | Admit: 2013-01-10 | Discharge: 2013-01-10 | Disposition: A | Discharge status: Home / Self Care | Payer: 59 | Source: Ambulatory Visit | Attending: Orthopedic Surgery | Admitting: Orthopedic Surgery

## 2013-01-10 ENCOUNTER — Encounter (HOSPITAL_COMMUNITY)
Admission: RE | Disposition: A | Discharge status: Home / Self Care | Payer: Self-pay | Source: Ambulatory Visit | Attending: Orthopedic Surgery

## 2013-01-10 ENCOUNTER — Ambulatory Visit (HOSPITAL_COMMUNITY): Payer: 59

## 2013-01-10 DIAGNOSIS — Y929 Unspecified place or not applicable: Secondary | ICD-10-CM | POA: Insufficient documentation

## 2013-01-10 DIAGNOSIS — F17200 Nicotine dependence, unspecified, uncomplicated: Secondary | ICD-10-CM | POA: Insufficient documentation

## 2013-01-10 DIAGNOSIS — Z91040 Latex allergy status: Secondary | ICD-10-CM | POA: Insufficient documentation

## 2013-01-10 DIAGNOSIS — G90529 Complex regional pain syndrome I of unspecified lower limb: Secondary | ICD-10-CM | POA: Insufficient documentation

## 2013-01-10 DIAGNOSIS — Z91013 Allergy to seafood: Secondary | ICD-10-CM | POA: Insufficient documentation

## 2013-01-10 DIAGNOSIS — S8290XS Unspecified fracture of unspecified lower leg, sequela: Secondary | ICD-10-CM | POA: Insufficient documentation

## 2013-01-10 DIAGNOSIS — Y831 Surgical operation with implant of artificial internal device as the cause of abnormal reaction of the patient, or of later complication, without mention of misadventure at the time of the procedure: Secondary | ICD-10-CM | POA: Insufficient documentation

## 2013-01-10 DIAGNOSIS — Z880 Allergy status to penicillin: Secondary | ICD-10-CM | POA: Insufficient documentation

## 2013-01-10 DIAGNOSIS — F3289 Other specified depressive episodes: Secondary | ICD-10-CM | POA: Insufficient documentation

## 2013-01-10 DIAGNOSIS — F329 Major depressive disorder, single episode, unspecified: Secondary | ICD-10-CM | POA: Insufficient documentation

## 2013-01-10 DIAGNOSIS — Z01812 Encounter for preprocedural laboratory examination: Secondary | ICD-10-CM | POA: Insufficient documentation

## 2013-01-10 DIAGNOSIS — I10 Essential (primary) hypertension: Secondary | ICD-10-CM | POA: Insufficient documentation

## 2013-01-10 DIAGNOSIS — T847XXA Infection and inflammatory reaction due to other internal orthopedic prosthetic devices, implants and grafts, initial encounter: Secondary | ICD-10-CM | POA: Insufficient documentation

## 2013-01-10 DIAGNOSIS — T84498A Other mechanical complication of other internal orthopedic devices, implants and grafts, initial encounter: Secondary | ICD-10-CM | POA: Insufficient documentation

## 2013-01-10 DIAGNOSIS — Z79899 Other long term (current) drug therapy: Secondary | ICD-10-CM | POA: Insufficient documentation

## 2013-01-10 DIAGNOSIS — Z91038 Other insect allergy status: Secondary | ICD-10-CM | POA: Insufficient documentation

## 2013-01-10 DIAGNOSIS — F1410 Cocaine abuse, uncomplicated: Secondary | ICD-10-CM | POA: Insufficient documentation

## 2013-01-10 DIAGNOSIS — K219 Gastro-esophageal reflux disease without esophagitis: Secondary | ICD-10-CM | POA: Insufficient documentation

## 2013-01-10 DIAGNOSIS — W1789XA Other fall from one level to another, initial encounter: Secondary | ICD-10-CM | POA: Insufficient documentation

## 2013-01-10 DIAGNOSIS — Z91041 Radiographic dye allergy status: Secondary | ICD-10-CM | POA: Insufficient documentation

## 2013-01-10 DIAGNOSIS — T8484XA Pain due to internal orthopedic prosthetic devices, implants and grafts, initial encounter: Secondary | ICD-10-CM

## 2013-01-10 HISTORY — PX: HARDWARE REMOVAL: SHX979

## 2013-01-10 HISTORY — DX: Essential (primary) hypertension: I10

## 2013-01-10 LAB — GRAM STAIN

## 2013-01-10 SURGERY — REMOVAL, HARDWARE
Anesthesia: General | Site: Foot | Laterality: Left | Wound class: Clean

## 2013-01-10 MED ORDER — 0.9 % SODIUM CHLORIDE (POUR BTL) OPTIME
TOPICAL | Status: DC | PRN
  Administered 2013-01-10: 1000 mL

## 2013-01-10 MED ORDER — HYDROMORPHONE HCL PF 1 MG/ML IJ SOLN
INTRAMUSCULAR | Status: DC | PRN
  Administered 2013-01-10: 1 mg via INTRAVENOUS

## 2013-01-10 MED ORDER — ONDANSETRON HCL 4 MG PO TABS: 4.0000 mg | ORAL_TABLET | Freq: Three times a day (TID) | ORAL | Status: DC | PRN

## 2013-01-10 MED ORDER — NEOSTIGMINE METHYLSULFATE 1 MG/ML IJ SOLN
INTRAMUSCULAR | Status: DC | PRN
  Administered 2013-01-10: 3 mg via INTRAVENOUS

## 2013-01-10 MED ORDER — HYDROMORPHONE HCL PF 1 MG/ML IJ SOLN
INTRAMUSCULAR | Status: AC
  Filled 2013-01-10: qty 1

## 2013-01-10 MED ORDER — FENTANYL CITRATE 0.05 MG/ML IJ SOLN
INTRAMUSCULAR | Status: DC | PRN
  Administered 2013-01-10: 50 ug via INTRAVENOUS
  Administered 2013-01-10: 100 ug via INTRAVENOUS
  Administered 2013-01-10: 50 ug via INTRAVENOUS
  Administered 2013-01-10: 150 ug via INTRAVENOUS
  Administered 2013-01-10: 100 ug via INTRAVENOUS
  Administered 2013-01-10: 50 ug via INTRAVENOUS

## 2013-01-10 MED ORDER — HYDROMORPHONE HCL PF 1 MG/ML IJ SOLN
0.2500 mg | INTRAMUSCULAR | Status: DC | PRN
  Administered 2013-01-10 (×2): 0.5 mg via INTRAVENOUS

## 2013-01-10 MED ORDER — LIDOCAINE HCL (CARDIAC) 20 MG/ML IV SOLN
INTRAVENOUS | Status: DC | PRN
  Administered 2013-01-10: 70 mg via INTRAVENOUS

## 2013-01-10 MED ORDER — MIDAZOLAM HCL 5 MG/5ML IJ SOLN
INTRAMUSCULAR | Status: DC | PRN
  Administered 2013-01-10: 4 mg via INTRAVENOUS
  Administered 2013-01-10: 1 mg via INTRAVENOUS

## 2013-01-10 MED ORDER — OXYCODONE HCL 5 MG PO TABS: 5.0000 mg | ORAL_TABLET | ORAL | Status: DC | PRN

## 2013-01-10 MED ORDER — OXYCODONE HCL 5 MG/5ML PO SOLN: 5.0000 mg | Freq: Once | ORAL | Status: DC | PRN

## 2013-01-10 MED ORDER — OXYCODONE-ACETAMINOPHEN 5-325 MG PO TABS: 1.0000 | ORAL_TABLET | Freq: Four times a day (QID) | ORAL | Status: DC | PRN

## 2013-01-10 MED ORDER — PROPOFOL 10 MG/ML IV BOLUS
INTRAVENOUS | Status: DC | PRN
  Administered 2013-01-10: 200 mg via INTRAVENOUS

## 2013-01-10 MED ORDER — GLYCOPYRROLATE 0.2 MG/ML IJ SOLN
INTRAMUSCULAR | Status: DC | PRN
  Administered 2013-01-10: 0.4 mg via INTRAVENOUS

## 2013-01-10 MED ORDER — DIPHENHYDRAMINE HCL 50 MG/ML IJ SOLN
12.5000 mg | Freq: Four times a day (QID) | INTRAMUSCULAR | Status: DC | PRN
  Administered 2013-01-10 (×2): 12.5 mg via INTRAVENOUS

## 2013-01-10 MED ORDER — OXYCODONE HCL 5 MG PO TABS: 5.0000 mg | ORAL_TABLET | Freq: Once | ORAL | Status: DC | PRN

## 2013-01-10 MED ORDER — MIDAZOLAM HCL 2 MG/2ML IJ SOLN
INTRAMUSCULAR | Status: AC
  Filled 2013-01-10: qty 2

## 2013-01-10 MED ORDER — LEVOFLOXACIN 500 MG PO TABS: 500.0000 mg | ORAL_TABLET | Freq: Every day | ORAL | Status: DC

## 2013-01-10 MED ORDER — VANCOMYCIN HCL IN DEXTROSE 1-5 GM/200ML-% IV SOLN
1000.0000 mg | Freq: Once | INTRAVENOUS | Status: AC
  Administered 2013-01-10: 1000 mg via INTRAVENOUS
  Filled 2013-01-10: qty 200

## 2013-01-10 MED ORDER — ONDANSETRON HCL 4 MG/2ML IJ SOLN: 4.0000 mg | Freq: Four times a day (QID) | INTRAMUSCULAR | Status: DC | PRN

## 2013-01-10 MED ORDER — ONDANSETRON HCL 4 MG/2ML IJ SOLN
INTRAMUSCULAR | Status: DC | PRN
  Administered 2013-01-10: 4 mg via INTRAVENOUS

## 2013-01-10 MED ORDER — KETOROLAC TROMETHAMINE 10 MG PO TABS: 10.0000 mg | ORAL_TABLET | Freq: Four times a day (QID) | ORAL | Status: DC | PRN

## 2013-01-10 MED ORDER — LABETALOL HCL 5 MG/ML IV SOLN
INTRAVENOUS | Status: DC | PRN
  Administered 2013-01-10: 10 mg via INTRAVENOUS

## 2013-01-10 MED ORDER — DIPHENHYDRAMINE HCL 50 MG/ML IJ SOLN
INTRAMUSCULAR | Status: AC
  Filled 2013-01-10: qty 1

## 2013-01-10 MED ORDER — LACTATED RINGERS IV SOLN
INTRAVENOUS | Status: DC | PRN
  Administered 2013-01-10 (×2): via INTRAVENOUS

## 2013-01-10 MED ORDER — BUPIVACAINE-EPINEPHRINE 0.5% -1:200000 IJ SOLN
INTRAMUSCULAR | Status: DC | PRN
  Administered 2013-01-10: 14 mL

## 2013-01-10 MED ORDER — ROCURONIUM BROMIDE 100 MG/10ML IV SOLN
INTRAVENOUS | Status: DC | PRN
  Administered 2013-01-10: 50 mg via INTRAVENOUS

## 2013-01-10 SURGICAL SUPPLY — 67 items
BANDAGE ELASTIC 4 VELCRO ST LF (GAUZE/BANDAGES/DRESSINGS) ×2 IMPLANT
BANDAGE ELASTIC 6 VELCRO ST LF (GAUZE/BANDAGES/DRESSINGS) ×2 IMPLANT
BANDAGE ESMARK 6X9 LF (GAUZE/BANDAGES/DRESSINGS) ×1 IMPLANT
BANDAGE GAUZE ELAST BULKY 4 IN (GAUZE/BANDAGES/DRESSINGS) ×4 IMPLANT
BNDG CMPR 9X6 STRL LF SNTH (GAUZE/BANDAGES/DRESSINGS) ×1
BNDG COHESIVE 6X5 TAN STRL LF (GAUZE/BANDAGES/DRESSINGS) ×1 IMPLANT
BNDG ESMARK 6X9 LF (GAUZE/BANDAGES/DRESSINGS) ×2
BRUSH SCRUB DISP (MISCELLANEOUS) ×4 IMPLANT
CANISTER SUCTION 2500CC (MISCELLANEOUS) ×1 IMPLANT
CLEANER TIP ELECTROSURG 2X2 (MISCELLANEOUS) ×2 IMPLANT
CLOTH BEACON ORANGE TIMEOUT ST (SAFETY) ×2 IMPLANT
COVER SURGICAL LIGHT HANDLE (MISCELLANEOUS) ×3 IMPLANT
CUFF TOURNIQUET SINGLE 18IN (TOURNIQUET CUFF) IMPLANT
CUFF TOURNIQUET SINGLE 24IN (TOURNIQUET CUFF) IMPLANT
CUFF TOURNIQUET SINGLE 34IN LL (TOURNIQUET CUFF) ×1 IMPLANT
DRAPE C-ARM 42X72 X-RAY (DRAPES) ×1 IMPLANT
DRAPE C-ARMOR (DRAPES) ×2 IMPLANT
DRAPE OEC MINIVIEW 54X84 (DRAPES) ×1 IMPLANT
DRAPE U-SHAPE 47X51 STRL (DRAPES) ×2 IMPLANT
DRSG ADAPTIC 3X8 NADH LF (GAUZE/BANDAGES/DRESSINGS) ×2 IMPLANT
ELECT REM PT RETURN 9FT ADLT (ELECTROSURGICAL) ×2
ELECTRODE REM PT RTRN 9FT ADLT (ELECTROSURGICAL) ×1 IMPLANT
EVACUATOR 1/8 PVC DRAIN (DRAIN) IMPLANT
GLOVE BIO SURGEON STRL SZ7.5 (GLOVE) ×1 IMPLANT
GLOVE BIO SURGEON STRL SZ8 (GLOVE) ×1 IMPLANT
GLOVE BIOGEL PI IND STRL 7.5 (GLOVE) ×1 IMPLANT
GLOVE BIOGEL PI IND STRL 8 (GLOVE) ×1 IMPLANT
GLOVE BIOGEL PI INDICATOR 7.5 (GLOVE) ×1
GLOVE BIOGEL PI INDICATOR 8 (GLOVE) ×1
GLOVE NEODERM STER SZ 7 (GLOVE) ×1 IMPLANT
GLOVE SURG SS PI 7.0 STRL IVOR (GLOVE) ×1 IMPLANT
GLOVE SURG SS PI 7.5 STRL IVOR (GLOVE) ×2 IMPLANT
GLOVE SURG SS PI 8.0 STRL IVOR (GLOVE) ×2 IMPLANT
GLOVE SURG SS PI 8.5 STRL IVOR (GLOVE) ×1
GLOVE SURG SS PI 8.5 STRL STRW (GLOVE) IMPLANT
GOWN PREVENTION PLUS XLARGE (GOWN DISPOSABLE) ×2 IMPLANT
GOWN STRL NON-REIN LRG LVL3 (GOWN DISPOSABLE) ×4 IMPLANT
KIT BASIN OR (CUSTOM PROCEDURE TRAY) ×2 IMPLANT
KIT ROOM TURNOVER OR (KITS) ×2 IMPLANT
MANIFOLD NEPTUNE II (INSTRUMENTS) ×1 IMPLANT
NEEDLE 22X1 1/2 (OR ONLY) (NEEDLE) ×1 IMPLANT
NS IRRIG 1000ML POUR BTL (IV SOLUTION) ×2 IMPLANT
PACK ORTHO EXTREMITY (CUSTOM PROCEDURE TRAY) ×2 IMPLANT
PAD ARMBOARD 7.5X6 YLW CONV (MISCELLANEOUS) ×4 IMPLANT
PADDING CAST COTTON 6X4 STRL (CAST SUPPLIES) ×3 IMPLANT
SPONGE GAUZE 4X4 12PLY (GAUZE/BANDAGES/DRESSINGS) ×2 IMPLANT
SPONGE LAP 18X18 X RAY DECT (DISPOSABLE) ×2 IMPLANT
SPONGE SCRUB IODOPHOR (GAUZE/BANDAGES/DRESSINGS) ×2 IMPLANT
STAPLER VISISTAT 35W (STAPLE) ×1 IMPLANT
STOCKINETTE IMPERVIOUS LG (DRAPES) ×1 IMPLANT
STRIP CLOSURE SKIN 1/2X4 (GAUZE/BANDAGES/DRESSINGS) IMPLANT
SUCTION FRAZIER TIP 10 FR DISP (SUCTIONS) ×1 IMPLANT
SUT ETHILON 2 0 FS 18 (SUTURE) ×1 IMPLANT
SUT ETHILON 3 0 PS 1 (SUTURE) ×1 IMPLANT
SUT PDS AB 2-0 CT1 27 (SUTURE) ×1 IMPLANT
SUT VIC AB 0 CT1 27 (SUTURE)
SUT VIC AB 0 CT1 27XBRD ANBCTR (SUTURE) IMPLANT
SUT VIC AB 2-0 CT1 27 (SUTURE)
SUT VIC AB 2-0 CT1 TAPERPNT 27 (SUTURE) IMPLANT
SUT VIC AB 2-0 SH 18 (SUTURE) ×1 IMPLANT
SYR CONTROL 10ML LL (SYRINGE) ×1 IMPLANT
TOWEL OR 17X24 6PK STRL BLUE (TOWEL DISPOSABLE) ×4 IMPLANT
TOWEL OR 17X26 10 PK STRL BLUE (TOWEL DISPOSABLE) ×4 IMPLANT
TUBE CONNECTING 12X1/4 (SUCTIONS) ×2 IMPLANT
UNDERPAD 30X30 INCONTINENT (UNDERPADS AND DIAPERS) ×2 IMPLANT
WATER STERILE IRR 1000ML POUR (IV SOLUTION) ×2 IMPLANT
YANKAUER SUCT BULB TIP NO VENT (SUCTIONS) ×1 IMPLANT

## 2013-01-10 NOTE — Anesthesia Postprocedure Evaluation (Signed)
Anesthesia Post Note  Patient: [name]  Procedure(s) Performed: Procedure(s) (LRB): HARDWARE REMOVAL, CALCANIOUS (Left)  Anesthesia type: General  Patient location: PACU  Post pain: Pain level controlled and Adequate analgesia  Post assessment: Post-op Vital signs reviewed, Patient's Cardiovascular Status Stable, Respiratory Function Stable, Patent Airway and Pain level controlled  Last Vitals:  Filed Vitals:   01/10/13 1130  BP: 153/86  Pulse: 60  Temp:   Resp: 32    Post vital signs: Reviewed and stable  Level of consciousness: awake, alert  and oriented  Complications: No apparent anesthesia complications

## 2013-01-10 NOTE — Transfer of Care (Signed)
Immediate Anesthesia Transfer of Care Note  Patient: [name]  Procedure(s) Performed: Procedure(s): HARDWARE REMOVAL, CALCANIOUS (Left)  Patient Location: PACU  Anesthesia Type:General  Level of Consciousness: awake, alert , oriented and patient cooperative  Airway & Oxygen Therapy: Patient Spontanous Breathing and Patient connected to nasal cannula oxygen  Post-op Assessment: Report given to PACU RN, Post -op Vital signs reviewed and stable and Patient moving all extremities  Post vital signs: Reviewed and stable  Complications: No apparent anesthesia complications
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
## 2013-01-10 NOTE — H&P (Signed)
I have seen and examined the patient. I agree with the findings above.  I discussed with the patient the risks and benefits of surgery for left heel, including the possibility of recurrent or persistent infection, nerve injury, vessel injury, wound breakdown, arthritis, DVT/ PE, loss of motion, and need for further surgery among others.  He understood these risks and wished to proceed.  Budd Palmer, MD 01/10/2013 8:05 AM

## 2013-01-10 NOTE — Anesthesia Preprocedure Evaluation (Signed)
Anesthesia Evaluation  Patient identified by MRN, date of birth, ID band Patient awake    Reviewed: Allergy & Precautions, H&P , NPO status , Patient's Chart, lab work & pertinent test results  Airway Mallampati: II  Neck ROM: full    Dental   Pulmonary Current Smoker,          Cardiovascular hypertension, + Peripheral Vascular Disease     Neuro/Psych Depression RSD in left foot  Neuromuscular disease    GI/Hepatic GERD-  ,  Endo/Other    Renal/GU      Musculoskeletal   Abdominal   Peds  Hematology   Anesthesia Other Findings   Reproductive/Obstetrics                           Anesthesia Physical Anesthesia Plan  ASA: II  Anesthesia Plan: General   Post-op Pain Management:    Induction: Intravenous  Airway Management Planned: Oral ETT  Additional Equipment:   Intra-op Plan:   Post-operative Plan: Extubation in OR  Informed Consent: I have reviewed the patients History and Physical, chart, labs and discussed the procedure including the risks, benefits and alternatives for the proposed anesthesia with the patient or authorized representative who has indicated his/her understanding and acceptance.     Plan Discussed with: CRNA, Anesthesiologist and Surgeon  Anesthesia Plan Comments:         Anesthesia Quick Evaluation

## 2013-01-10 NOTE — Brief Op Note (Signed)
01/10/2013  11:08 AM  PATIENT:  [name]  47 y.o. male  PRE-OPERATIVE DIAGNOSIS:  symptomatic hardware left calcaneus  POST-OPERATIVE DIAGNOSIS:  symptomatic hardware left calcaneus  PROCEDURE:  Procedure(s): HARDWARE REMOVAL, CALCANEUS (Left)  SURGEON:  Surgeon(s) and Role:    * Budd Palmer, MD - Primary  PHYSICIAN ASSISTANT: None   ANESTHESIA:   general  EBL:  Total I/O In: 1350 [I.V.:1350] Out: -   BLOOD ADMINISTERED:none  DRAINS: none   LOCAL MEDICATIONS USED:  MARCAINE     SPECIMEN:  periimplant  DISPOSITION OF SPECIMEN:  micro  COUNTS:  YES  TOURNIQUET:    DICTATION: .Other Dictation: Dictation Number [id number]  PLAN OF CARE: Discharge to home after PACU  PATIENT DISPOSITION:  PACU - hemodynamically stable.   Delay start of Pharmacological VTE agent (>24hrs) due to surgical blood loss or risk of bleeding: no

## 2013-01-11 ENCOUNTER — Encounter (HOSPITAL_COMMUNITY): Payer: Self-pay | Admitting: Orthopedic Surgery

## 2013-01-11 NOTE — Op Note (Signed)
[name], [name]                  ACCOUNT NO.:  [id number]  MEDICAL RECORD NO.:  [id number]  LOCATION:  [facility]                         FACILITY:  [facility]  PHYSICIAN:  Doralee Albino. Carola Frost, M.D. DATE OF BIRTH:  [birth year]  DATE OF PROCEDURE:  01/10/2013 DATE OF DISCHARGE:  01/10/2013                              OPERATIVE REPORT   PREOPERATIVE DIAGNOSIS:  Symptomatic left calcaneus hardware status post infection and loosening.  POSTOPERATIVE DIAGNOSIS:  Symptomatic left calcaneus hardware status post infection and loosening.  PROCEDURE:  Removal of deep implant, left calcaneus.  SURGEON:  Doralee Albino. Carola Frost, M.D.  ASSISTANT:  None.  ANESTHESIA:  General.  COMPLICATIONS:  None.  TOURNIQUET:  None.  SPECIMENS:  2 anaerobic, aerobic sent to micro.  FINDINGS:  No bacteria.  DRAINS:  None.  DISPOSITION:  To PACU.  CONDITION:  Stable.  BRIEF SUMMARY AND INDICATION FOR PROCEDURE:  [name] is a 47 year old male status post severe calcaneus fracture treated with ORIF and subtalar fusion.  He went on to unite but did have persistent wound troubles and recurrent soft tissue infection that did eventually resolve but left in with some loose hardware that has remained persistently symptomatic.  I have discussed the risks and benefits.  The patient did wish to proceed with removal, understand risks to include, recurrent or persistent infection, need further surgery, DVT, PE, heart attack, stroke, or other perioperative complications including possibility of failure to alleviate all symptoms.  BRIEF SUMMARY OF PROCEDURE:  [name] was given vanc preoperatively, taken to the operating room and general anesthesia was induced.  His left lower extremity was prepped and draped in usual sterile fashion with position lazy lateral and the old extensile approach remade. Dissection was carried down to the plate with the soft tissues elevated over top.  The hardware was removed from the plate as  well as some fibrinous tissue over the plate itself back to healthy tissue.  It was irrigated thoroughly.  Also, the 2 screws removed from the calcaneus and some of the material from this was sent for anaerobic and aerobic cultures did not identify any bacteria.  These wounds were irrigated as well.  Standard layered closure was performed using a 2-0 Vicryl, 2-0 PDS, and 2-0 nylon for the tuberosity with 3-0 nylon for the primary calcaneal incision laterally.  Sterile gently compressive dressing was applied.  C-arm was used to confirm removal of hardware and location of the tuberosity implants.  There were no complications.  The patient was taken to PACU in stable condition after I did apply 14 mL of Marcaine without epi.  PROGNOSIS:  [name] will be nonweightbearing with use of the compressive dressing for the next 4 days and aggressive ice and elevation.  We will plan to see him back in the office for removal of sutures in 2 weeks.  We do plan to discuss with him help for his cocaine use.  The patient expressed to anesthesia, desire to quit today which showed the outstanding.     Doralee Albino. Carola Frost, M.D.     MHH/MEDQ  D:  01/10/2013  T:  01/11/2013  Job:  [id number]

## 2013-01-13 LAB — TISSUE CULTURE: Culture: NO GROWTH

## 2013-01-15 ENCOUNTER — Encounter: Payer: Self-pay | Admitting: Family Medicine

## 2013-01-15 ENCOUNTER — Ambulatory Visit (INDEPENDENT_AMBULATORY_CARE_PROVIDER_SITE_OTHER): Payer: 59 | Admitting: Family Medicine

## 2013-01-15 VITALS — BP 136/90 | HR 92 | Temp 97.5°F | Wt 210.8 lb

## 2013-01-15 DIAGNOSIS — R109 Unspecified abdominal pain: Secondary | ICD-10-CM

## 2013-01-15 LAB — ANAEROBIC CULTURE

## 2013-01-15 NOTE — Progress Notes (Signed)
Recently with L ankle surgery.  Notes reviewed.    Sunday with abd pain, worse in the interval, over the last 2 days.  Not sore to touch but more painful walking.  No vomiting.  Minimal nausea, possibly from pain/pain meds.  Still able to eat, small amounts.  No fevers.  No diarrhea.  No blood in stool.  Taking ibuprofen, 4-5 pills a week, not more often. No burning with urination.   He is fatigued. Last BM 8PM last night.    He had a rash after surgery, itchy, on the trunk, unclear if related to meds during surgery.  Resolved now.  He'll notify Dr. Carola Frost about this.    Was using two crutches until Sunday, now on one and didn't know if he had pulled a muscle.  He isn't weight bearing on the L foot.    Meds, vitals, and allergies reviewed.   ROS: See HPI.  Otherwise, noncontributory.  nad ncat rrr ctab abd soft, not ttp in the BUQ, lower abd minimally ttp, along the obliques. More pain with coughing, laughing, twisting.  Pain reproduced with lifting both feet off the floor at the same time.  Normal BS, no rebound Ext w/o edema except for L ankle puffy and dressed- postsurgical No rash

## 2013-01-15 NOTE — Patient Instructions (Addendum)
It looks like you pulled muscles in your abdomen.  Rest for now and use the crutches.  Take your regular pain medicine.

## 2013-01-15 NOTE — Assessment & Plan Note (Signed)
Likely MSK strain, d/w pt about using crutch on the R side and that helped some.  Would use baseline pain meds for now.  This doesn't look to be intraabdominal.  Okay for outpatient f/u.  He'll talk to surgery about the reported rash on his back after surgery- none seen now.

## 2013-06-21 ENCOUNTER — Encounter: Payer: Self-pay | Admitting: Family Medicine

## 2013-06-21 ENCOUNTER — Ambulatory Visit (INDEPENDENT_AMBULATORY_CARE_PROVIDER_SITE_OTHER): Payer: 59 | Admitting: Family Medicine

## 2013-06-21 VITALS — BP 140/90 | HR 83 | Temp 97.6°F | Ht 71.0 in | Wt 217.8 lb

## 2013-06-21 DIAGNOSIS — I10 Essential (primary) hypertension: Secondary | ICD-10-CM

## 2013-06-21 DIAGNOSIS — G4733 Obstructive sleep apnea (adult) (pediatric): Secondary | ICD-10-CM | POA: Insufficient documentation

## 2013-06-21 DIAGNOSIS — Z23 Encounter for immunization: Secondary | ICD-10-CM

## 2013-06-21 LAB — POCT URINALYSIS DIPSTICK
Bilirubin, UA: NEGATIVE
Glucose, UA: NEGATIVE
Ketones, UA: NEGATIVE
Leukocytes, UA: NEGATIVE
Nitrite, UA: NEGATIVE
Protein, UA: NEGATIVE
Spec Grav, UA: 1.015
Urobilinogen, UA: 0.2
pH, UA: 6.5

## 2013-06-21 MED ORDER — HYDROCHLOROTHIAZIDE 25 MG PO TABS: 25.0000 mg | ORAL_TABLET | Freq: Every day | ORAL | Status: DC

## 2013-06-21 NOTE — Assessment & Plan Note (Signed)
Most consistent with sleep apnea, no narcolepsy. Refer for sleep testing.

## 2013-06-21 NOTE — Progress Notes (Signed)
Pre-visit discussion using our clinic review tool. No additional management support is needed unless otherwise documented below in the visit note.  
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
## 2013-06-21 NOTE — Patient Instructions (Addendum)
Stop at front desk for referral to sleep specialist. Start HCTZ in AM.  Follow BP at home. Record measurements. Return in 1 month for CPX, with fasting labs prior.

## 2013-06-21 NOTE — Assessment & Plan Note (Signed)
New diagnosis. Sleep apnea likely contributes, need to treat. EKG, UA  Eval with labs for secondary cause, end organ damage and risk factors for CAD.  Start HCTZ, follow BP at home. Return in 1 month for re-eval.

## 2013-07-10 ENCOUNTER — Encounter: Payer: Self-pay | Admitting: Family Medicine

## 2013-07-10 ENCOUNTER — Ambulatory Visit (INDEPENDENT_AMBULATORY_CARE_PROVIDER_SITE_OTHER): Payer: 59 | Admitting: Family Medicine

## 2013-07-10 VITALS — BP 134/92 | HR 88 | Temp 98.3°F | Wt 216.0 lb

## 2013-07-10 DIAGNOSIS — I10 Essential (primary) hypertension: Secondary | ICD-10-CM

## 2013-07-10 MED ORDER — AMLODIPINE BESYLATE 5 MG PO TABS: 5.0000 mg | ORAL_TABLET | Freq: Every day | ORAL | Status: DC

## 2013-07-10 NOTE — Progress Notes (Signed)
Pre-visit discussion using our clinic review tool. No additional management support is needed unless otherwise documented below in the visit note.  

## 2013-07-10 NOTE — Assessment & Plan Note (Signed)
Possible side effect to HCTZ - will stop HCTZ and start amlodipine 5mg  daily. Has f/u appt with PCP and for blood work.  I asked him to bring bp cuff to next appt to ensure comparable readings to our manual office cuff. Pt agrees with plan

## 2013-07-10 NOTE — Progress Notes (Signed)
   Subjective:    Patient ID: [name], male    DOB: 12-09-1965, 47 y.o.   MRN: [id number]  HPI CC: BP concerns  [name] presents today to discuss BP concerns.  He saw PCP early this month and was started on HCTZ 25mg  daily for blood pressure control.  He was also referred for sleep study.  Feels bp hasn't changed at all - home readings range 150-160/90s.  Also notices side effect of food tasting bad with anything he eats.  Also noticed tongue ulcers initially.  No HA, vision changes, CP/tightness, SOB, leg swelling.  BP Readings from Last 3 Encounters:  07/10/13 134/92  06/21/13 140/90  01/15/13 136/90   Past Medical History  Diagnosis Date  . Compression fracture of L1 lumbar vertebra 06/21/2012    <10%/notes 06/21/2012  . Kidney stones   . Calcaneal fracture 06/21/2012    left   . Fall from tree 06/21/2012    L1 <10% fracture; calcaneal frx left   . RSD (reflex sympathetic dystrophy), L foot due to calcaneus fx 08/14/2012  . GERD (gastroesophageal reflux disease)   . Hypertension     no on any medications  . Polysubstance abuse   . Substance abuse     Alcohol or Drug problem (Alcohol and Cocaine)  . Hemorrhoids      Review of Systems Per HPI    Objective:   Physical Exam  Nursing note and vitals reviewed. Constitutional: He appears well-developed and well-nourished. No distress.  HENT:  Mouth/Throat: Oropharynx is clear and moist. No oropharyngeal exudate.  Cardiovascular: Normal rate, regular rhythm, normal heart sounds and intact distal pulses.   No murmur heard. Pulmonary/Chest: Effort normal and breath sounds normal. No respiratory distress. He has no wheezes. He has no rales.  Musculoskeletal: He exhibits no edema.  Skin: Skin is warm and dry. No rash noted.       Assessment & Plan:

## 2013-07-10 NOTE — Patient Instructions (Signed)
Bring your blood pressure cuff to next appointment with Dr. Ermalene Searing Stop hydrochlorothiazide. Start amlodipine 5mg  daily for blood pressure. Keep follow up appointment with Dr. Ermalene Searing.

## 2013-07-24 ENCOUNTER — Telehealth: Payer: Self-pay | Admitting: Family Medicine

## 2013-07-24 DIAGNOSIS — I10 Essential (primary) hypertension: Secondary | ICD-10-CM

## 2013-07-24 NOTE — Telephone Encounter (Signed)
Message copied by Jinny Sanders on Wed Jul 24, 2013  9:52 PM ------      Message from: Ellamae Sia      Created: Mon Jul 15, 2013 12:06 PM      Regarding: Lab orders for Thursday, 1.8.15       Patient is scheduled for CPX labs, please order future labs, Thanks , Terri       ------

## 2013-07-25 ENCOUNTER — Other Ambulatory Visit: Payer: 59

## 2013-07-31 ENCOUNTER — Ambulatory Visit (INDEPENDENT_AMBULATORY_CARE_PROVIDER_SITE_OTHER): Payer: 59 | Admitting: Internal Medicine

## 2013-07-31 ENCOUNTER — Encounter: Payer: Self-pay | Admitting: Internal Medicine

## 2013-07-31 VITALS — BP 146/88 | HR 85 | Ht 72.0 in | Wt 220.8 lb

## 2013-07-31 DIAGNOSIS — F17200 Nicotine dependence, unspecified, uncomplicated: Secondary | ICD-10-CM

## 2013-07-31 DIAGNOSIS — G4733 Obstructive sleep apnea (adult) (pediatric): Secondary | ICD-10-CM

## 2013-07-31 DIAGNOSIS — Z720 Tobacco use: Secondary | ICD-10-CM | POA: Insufficient documentation

## 2013-07-31 NOTE — Patient Instructions (Signed)
Order- schedule split protocol NPSG       Dx OSA  Please call as needed 
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
## 2013-07-31 NOTE — Assessment & Plan Note (Signed)
Initial counseling

## 2013-07-31 NOTE — Assessment & Plan Note (Signed)
High probability OSA from hx and exam. Nose looks clear anteriorly, but he sounds stuffy and tends to mouth breathe, despite 2 surgeries. HBP.  We discussed OSA, the sleep study process, and driving responsibility Plan- split protocol sleep study

## 2013-08-01 ENCOUNTER — Encounter: Payer: 59 | Admitting: Family Medicine

## 2013-08-06 ENCOUNTER — Other Ambulatory Visit: Payer: Self-pay | Admitting: Internal Medicine

## 2013-08-06 DIAGNOSIS — G4733 Obstructive sleep apnea (adult) (pediatric): Secondary | ICD-10-CM

## 2013-08-16 ENCOUNTER — Telehealth: Payer: Self-pay | Admitting: Internal Medicine

## 2013-08-16 NOTE — Telephone Encounter (Signed)
Spoke with pt. He reports his insurance denied the lab study but would cover the in home sleep study. He reports he was told he would get a call this week about when to pick the machine up. Please advise PCC's thanks

## 2013-08-21 ENCOUNTER — Ambulatory Visit (HOSPITAL_BASED_OUTPATIENT_CLINIC_OR_DEPARTMENT_OTHER): Payer: 59 | Attending: Internal Medicine

## 2013-08-22 NOTE — Telephone Encounter (Signed)
LMOAM for pt to return my call to schedule home sleep study. Rhonda J Cobb

## 2013-08-22 NOTE — Telephone Encounter (Signed)
Called pt again and lmoam for pt to return my call to schedule HST. Rhonda J Cobb

## 2013-08-26 NOTE — Telephone Encounter (Signed)
LMOAM for pt to return my call to arrange study. Given my hours of 8:30 - 5:30 pm. If pt reaches my voice mail, then please, leave message and I will return your call. Rhonda J Cobb

## 2013-08-27 NOTE — Telephone Encounter (Signed)
LMOAM on the phone number listed below. Called home number and spoke with pt's mother and gave her my phone number for pt to return my call to schedule study. Apparently, according to pt's mother, pt can not get messages on his cell phone. Waiting on pt to return my call. Rhonda J Cobb

## 2013-08-27 NOTE — Telephone Encounter (Signed)
Patient returned phone call and has been set up to pick up the HST device on Wed 08/28/13. Pt is aware. Nothing else needed at this time. Rhonda J Cobb

## 2013-08-28 DIAGNOSIS — G4733 Obstructive sleep apnea (adult) (pediatric): Secondary | ICD-10-CM

## 2013-09-05 ENCOUNTER — Encounter: Payer: Self-pay | Admitting: Internal Medicine

## 2013-09-05 DIAGNOSIS — G4733 Obstructive sleep apnea (adult) (pediatric): Secondary | ICD-10-CM

## 2013-09-11 ENCOUNTER — Encounter: Payer: Self-pay | Admitting: Internal Medicine

## 2013-09-11 ENCOUNTER — Ambulatory Visit (INDEPENDENT_AMBULATORY_CARE_PROVIDER_SITE_OTHER): Payer: 59 | Admitting: Internal Medicine

## 2013-09-11 VITALS — BP 124/80 | HR 86 | Ht 72.0 in | Wt 212.6 lb

## 2013-09-11 DIAGNOSIS — F17200 Nicotine dependence, unspecified, uncomplicated: Secondary | ICD-10-CM

## 2013-09-11 DIAGNOSIS — G4733 Obstructive sleep apnea (adult) (pediatric): Secondary | ICD-10-CM

## 2013-09-11 NOTE — Assessment & Plan Note (Signed)
Mild obstructive sleep apnea/ hypopnea syndrome. He is particularly self-conscious about his snoring. Doubt he could breathe comfortably enough through his nose to permit use of oral appliance. He is interested in trying CPAP, but would also like to see ENT about chronic post-op nasal stuffiness.
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
## 2013-09-11 NOTE — Assessment & Plan Note (Signed)
Encouraged to discuss smoking cessation when he is ready.

## 2013-09-11 NOTE — Patient Instructions (Signed)
Order- new DME new CPAP autotitrate x 7 days for pressure recommendation, mask of choice, humidifier, supplies     Dx OSA  Order - referral to [facility] ENT for dx OSA  Please call as needed

## 2013-10-25 ENCOUNTER — Encounter: Payer: 59 | Admitting: Family Medicine

## 2013-10-25 ENCOUNTER — Ambulatory Visit: Payer: 59 | Admitting: Internal Medicine

## 2013-10-25 DIAGNOSIS — Z0289 Encounter for other administrative examinations: Secondary | ICD-10-CM

## 2014-01-21 ENCOUNTER — Telehealth: Payer: Self-pay

## 2014-01-21 ENCOUNTER — Other Ambulatory Visit: Payer: Self-pay

## 2014-01-21 MED ORDER — EPINEPHRINE 0.3 MG/0.3ML IJ SOAJ: 0.3000 mg | Freq: Once | INTRAMUSCULAR | Status: DC

## 2014-01-21 NOTE — Telephone Encounter (Signed)
Left message for Tonya to return my call. °

## 2014-01-21 NOTE — Telephone Encounter (Signed)
Agreed -

## 2014-01-21 NOTE — Telephone Encounter (Signed)
[name] notified as instructed by telephone.  [name] states he has been feeling nauseous. Not sure if it is related to the bee sting.  He stayed home from work today and states he has been trying to do things around the house, but when he gets hot and sweaty that is when he starts to feel nauseated.  Advised to make sure he drinking lots of water to stay hydrated since it is so hot outside.  Advised if he is not feeling any better tomorrow to call us back and we can have him seen in the office.

## 2014-01-21 NOTE — Telephone Encounter (Signed)
Epi pen sent in for refill.  notify pt.  If SOB, difficulty breathing or swallowing.. Use epi pen and go to ER. Otherwise continue benadryl.

## 2014-01-21 NOTE — Telephone Encounter (Signed)
Pt left v/m; pt is allergic to bee sting and pt was stung on lip last night and pt is out of epipens.Pt's mother said pt took Benadryl last night and no trouble breathing and swelling of lip is better this morning. Pt request rx for epipen sent to [facility].(not on med list).

## 2014-09-08 ENCOUNTER — Emergency Department (HOSPITAL_COMMUNITY): Payer: Worker's Compensation

## 2014-09-08 ENCOUNTER — Emergency Department (HOSPITAL_COMMUNITY)
Admission: EM | Admit: 2014-09-08 | Discharge: 2014-09-08 | Disposition: A | Discharge status: Home / Self Care | Payer: Worker's Compensation | Attending: Emergency Medicine | Admitting: Emergency Medicine

## 2014-09-08 ENCOUNTER — Encounter (HOSPITAL_COMMUNITY): Payer: Self-pay | Admitting: *Deleted

## 2014-09-08 DIAGNOSIS — Y990 Civilian activity done for income or pay: Secondary | ICD-10-CM | POA: Diagnosis not present

## 2014-09-08 DIAGNOSIS — Z91040 Latex allergy status: Secondary | ICD-10-CM | POA: Diagnosis not present

## 2014-09-08 DIAGNOSIS — Z8669 Personal history of other diseases of the nervous system and sense organs: Secondary | ICD-10-CM | POA: Insufficient documentation

## 2014-09-08 DIAGNOSIS — Z87442 Personal history of urinary calculi: Secondary | ICD-10-CM | POA: Diagnosis not present

## 2014-09-08 DIAGNOSIS — Z720 Tobacco use: Secondary | ICD-10-CM | POA: Insufficient documentation

## 2014-09-08 DIAGNOSIS — W208XXA Other cause of strike by thrown, projected or falling object, initial encounter: Secondary | ICD-10-CM | POA: Diagnosis not present

## 2014-09-08 DIAGNOSIS — Y9289 Other specified places as the place of occurrence of the external cause: Secondary | ICD-10-CM | POA: Insufficient documentation

## 2014-09-08 DIAGNOSIS — T1490XA Injury, unspecified, initial encounter: Secondary | ICD-10-CM

## 2014-09-08 DIAGNOSIS — S3992XA Unspecified injury of lower back, initial encounter: Secondary | ICD-10-CM | POA: Diagnosis present

## 2014-09-08 DIAGNOSIS — S32010A Wedge compression fracture of first lumbar vertebra, initial encounter for closed fracture: Secondary | ICD-10-CM | POA: Insufficient documentation

## 2014-09-08 DIAGNOSIS — I10 Essential (primary) hypertension: Secondary | ICD-10-CM | POA: Insufficient documentation

## 2014-09-08 DIAGNOSIS — Y9389 Activity, other specified: Secondary | ICD-10-CM | POA: Diagnosis not present

## 2014-09-08 DIAGNOSIS — Z8781 Personal history of (healed) traumatic fracture: Secondary | ICD-10-CM | POA: Insufficient documentation

## 2014-09-08 DIAGNOSIS — Z87828 Personal history of other (healed) physical injury and trauma: Secondary | ICD-10-CM | POA: Diagnosis not present

## 2014-09-08 DIAGNOSIS — Z79899 Other long term (current) drug therapy: Secondary | ICD-10-CM | POA: Insufficient documentation

## 2014-09-08 DIAGNOSIS — Z8719 Personal history of other diseases of the digestive system: Secondary | ICD-10-CM | POA: Diagnosis not present

## 2014-09-08 DIAGNOSIS — Z880 Allergy status to penicillin: Secondary | ICD-10-CM | POA: Diagnosis not present

## 2014-09-08 DIAGNOSIS — R0603 Acute respiratory distress: Secondary | ICD-10-CM

## 2014-09-08 LAB — I-STAT CHEM 8, ED
BUN: 19 mg/dL (ref 6–23)
Calcium, Ion: 1.2 mmol/L (ref 1.12–1.23)
Chloride: 105 mmol/L (ref 96–112)
Creatinine, Ser: 1.3 mg/dL (ref 0.50–1.35)
Glucose, Bld: 142 mg/dL — ABNORMAL HIGH (ref 70–99)
HCT: 49 % (ref 39.0–52.0)
Hemoglobin: 16.7 g/dL (ref 13.0–17.0)
Potassium: 4 mmol/L (ref 3.5–5.1)
Sodium: 142 mmol/L (ref 135–145)
TCO2: 21 mmol/L (ref 0–100)

## 2014-09-08 MED ORDER — FENTANYL CITRATE 100 MCG BU TABS: 100.0000 ug | ORAL_TABLET | Freq: Once | BUCCAL | Status: DC

## 2014-09-08 MED ORDER — FENTANYL CITRATE 0.05 MG/ML IJ SOLN
100.0000 ug | Freq: Once | INTRAMUSCULAR | Status: AC
  Administered 2014-09-08: 100 ug via INTRAVENOUS

## 2014-09-08 MED ORDER — OXYCODONE-ACETAMINOPHEN 5-325 MG PO TABS: 2.0000 | ORAL_TABLET | ORAL | Status: DC | PRN

## 2014-09-08 MED ORDER — FENTANYL CITRATE 0.05 MG/ML IJ SOLN
INTRAMUSCULAR | Status: AC
  Filled 2014-09-08: qty 2

## 2014-09-08 MED ORDER — HYDROMORPHONE HCL 1 MG/ML IJ SOLN
1.0000 mg | Freq: Once | INTRAMUSCULAR | Status: AC
  Administered 2014-09-08: 1 mg via INTRAVENOUS
  Filled 2014-09-08: qty 1
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
## 2014-09-08 NOTE — ED Notes (Signed)
Pt ambulatory to restroom and back to exam room without difficulty.

## 2014-09-08 NOTE — ED Notes (Signed)
Pt's oxygen sat at 90% on room air; reports decreased pain after fentanyl.

## 2014-09-08 NOTE — ED Notes (Signed)
Dr Audie Pinto performing [place] to left chest.

## 2014-09-08 NOTE — Discharge Instructions (Signed)
Vertebral Fracture A vertebral fracture is when one or more bones in the spine are broken (fractured). These bones are called vertebra. You may have pain and be stiff for 3 to 6 weeks.  HOME CARE   Rest in bed for a few days.  Take pain medicine as told by your doctor.  Slowly return to activity as told by your doctor.  Ask your doctor if any physical therapy is needed. GET HELP RIGHT AWAY IF:   Your pain gets worse.  You throw up (vomit).  You cannot move around at all.  You have numbness, tingling, weakness, or cannot move any part of your body.  You cannot control your poop (bowel movements) or pee (urination).  You have chest or belly (abdominal) pain, coughing, or trouble breathing.  You have a temperature by mouth above 102 F (38.9 C), not controlled by medicine. MAKE SURE YOU:   Understand these instructions.  Will watch your condition.  Will get help right away if you are not doing well or get worse. Document Released: 12/22/2009 Document Revised: 04/24/2013 Document Reviewed: 12/22/2009 [facility] Patient Information 2015 [place], [place]. This information is not intended to replace advice given to you by your health care provider. Make sure you discuss any questions you have with your health care provider.
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
## 2014-09-08 NOTE — ED Notes (Signed)
Ice chips given per MD
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
## 2014-09-21 ENCOUNTER — Emergency Department (HOSPITAL_COMMUNITY): Payer: Worker's Compensation

## 2014-09-21 ENCOUNTER — Emergency Department (HOSPITAL_COMMUNITY)
Admission: EM | Admit: 2014-09-21 | Discharge: 2014-09-21 | Disposition: A | Discharge status: Home / Self Care | Payer: Worker's Compensation | Attending: Emergency Medicine | Admitting: Emergency Medicine

## 2014-09-21 ENCOUNTER — Encounter (HOSPITAL_COMMUNITY): Payer: Self-pay | Admitting: Emergency Medicine

## 2014-09-21 DIAGNOSIS — Z87442 Personal history of urinary calculi: Secondary | ICD-10-CM | POA: Diagnosis not present

## 2014-09-21 DIAGNOSIS — Z8781 Personal history of (healed) traumatic fracture: Secondary | ICD-10-CM | POA: Diagnosis not present

## 2014-09-21 DIAGNOSIS — M5489 Other dorsalgia: Secondary | ICD-10-CM

## 2014-09-21 DIAGNOSIS — F419 Anxiety disorder, unspecified: Secondary | ICD-10-CM | POA: Insufficient documentation

## 2014-09-21 DIAGNOSIS — Z91040 Latex allergy status: Secondary | ICD-10-CM | POA: Insufficient documentation

## 2014-09-21 DIAGNOSIS — R1013 Epigastric pain: Secondary | ICD-10-CM | POA: Insufficient documentation

## 2014-09-21 DIAGNOSIS — I10 Essential (primary) hypertension: Secondary | ICD-10-CM | POA: Insufficient documentation

## 2014-09-21 DIAGNOSIS — R0602 Shortness of breath: Secondary | ICD-10-CM | POA: Diagnosis not present

## 2014-09-21 DIAGNOSIS — Z880 Allergy status to penicillin: Secondary | ICD-10-CM | POA: Insufficient documentation

## 2014-09-21 DIAGNOSIS — Z720 Tobacco use: Secondary | ICD-10-CM | POA: Insufficient documentation

## 2014-09-21 DIAGNOSIS — M546 Pain in thoracic spine: Secondary | ICD-10-CM | POA: Diagnosis not present

## 2014-09-21 DIAGNOSIS — M549 Dorsalgia, unspecified: Secondary | ICD-10-CM

## 2014-09-21 LAB — CBC
HCT: 45.6 % (ref 39.0–52.0)
Hemoglobin: 15.8 g/dL (ref 13.0–17.0)
MCH: 30.2 pg (ref 26.0–34.0)
MCHC: 34.6 g/dL (ref 30.0–36.0)
MCV: 87.2 fL (ref 78.0–100.0)
Platelets: 225 10*3/uL (ref 150–400)
RBC: 5.23 MIL/uL (ref 4.22–5.81)
RDW: 13.2 % (ref 11.5–15.5)
WBC: 9 10*3/uL (ref 4.0–10.5)

## 2014-09-21 LAB — D-DIMER, QUANTITATIVE (NOT AT ARMC): D-Dimer, Quant: 1.01 ug/mL-FEU — ABNORMAL HIGH (ref 0.00–0.48)

## 2014-09-21 LAB — COMPREHENSIVE METABOLIC PANEL
ALT: 21 U/L (ref 0–53)
AST: 21 U/L (ref 0–37)
Albumin: 3.7 g/dL (ref 3.5–5.2)
Alkaline Phosphatase: 126 U/L — ABNORMAL HIGH (ref 39–117)
Anion gap: 5 (ref 5–15)
BUN: 16 mg/dL (ref 6–23)
CO2: 27 mmol/L (ref 19–32)
Calcium: 9 mg/dL (ref 8.4–10.5)
Chloride: 104 mmol/L (ref 96–112)
Creatinine, Ser: 1.25 mg/dL (ref 0.50–1.35)
GFR calc Af Amer: 77 mL/min — ABNORMAL LOW (ref >90)
GFR calc non Af Amer: 67 mL/min — ABNORMAL LOW (ref >90)
Glucose, Bld: 116 mg/dL — ABNORMAL HIGH (ref 70–99)
Potassium: 4 mmol/L (ref 3.5–5.1)
Sodium: 136 mmol/L (ref 135–145)
Total Bilirubin: 0.5 mg/dL (ref 0.3–1.2)
Total Protein: 6.7 g/dL (ref 6.0–8.3)

## 2014-09-21 LAB — URINALYSIS, ROUTINE W REFLEX MICROSCOPIC
Bilirubin Urine: NEGATIVE
Glucose, UA: NEGATIVE mg/dL
Hgb urine dipstick: NEGATIVE
Ketones, ur: NEGATIVE mg/dL
Leukocytes, UA: NEGATIVE
Nitrite: NEGATIVE
Protein, ur: NEGATIVE mg/dL
Specific Gravity, Urine: 1.03 (ref 1.005–1.030)
Urobilinogen, UA: 0.2 mg/dL (ref 0.0–1.0)
pH: 5 (ref 5.0–8.0)

## 2014-09-21 LAB — BRAIN NATRIURETIC PEPTIDE: B Natriuretic Peptide: 11.3 pg/mL (ref 0.0–100.0)

## 2014-09-21 LAB — I-STAT TROPONIN, ED: Troponin i, poc: 0 ng/mL (ref 0.00–0.08)

## 2014-09-21 MED ORDER — KETOROLAC TROMETHAMINE 30 MG/ML IJ SOLN
30.0000 mg | Freq: Once | INTRAMUSCULAR | Status: AC
Start: 2014-09-21 — End: 2014-09-21
  Administered 2014-09-21: 30 mg via INTRAVENOUS
  Filled 2014-09-21: qty 1

## 2014-09-21 MED ORDER — OXYCODONE-ACETAMINOPHEN 5-325 MG PO TABS: 1.0000 | ORAL_TABLET | Freq: Three times a day (TID) | ORAL | Status: DC | PRN

## 2014-09-21 MED ORDER — TECHNETIUM TC 99M DIETHYLENETRIAME-PENTAACETIC ACID: 40.0000 | Freq: Once | INTRAVENOUS | Status: DC | PRN

## 2014-09-21 MED ORDER — CYCLOBENZAPRINE HCL 10 MG PO TABS: 10.0000 mg | ORAL_TABLET | Freq: Two times a day (BID) | ORAL | Status: DC | PRN

## 2014-09-21 MED ORDER — ONDANSETRON HCL 4 MG/2ML IJ SOLN
4.0000 mg | Freq: Once | INTRAMUSCULAR | Status: AC
Start: 2014-09-21 — End: 2014-09-21
  Administered 2014-09-21: 4 mg via INTRAVENOUS
  Filled 2014-09-21: qty 2

## 2014-09-21 MED ORDER — TECHNETIUM TO 99M ALBUMIN AGGREGATED
6.0000 | Freq: Once | INTRAVENOUS | Status: AC | PRN
  Administered 2014-09-21: 6 via INTRAVENOUS

## 2014-09-21 MED ORDER — TECHNETIUM TO 99M ALBUMIN AGGREGATED: 6.0000 | Freq: Once | INTRAVENOUS | Status: DC | PRN

## 2014-09-21 MED ORDER — DIAZEPAM 5 MG/ML IJ SOLN
2.5000 mg | Freq: Once | INTRAMUSCULAR | Status: AC
  Administered 2014-09-21: 2.5 mg via INTRAVENOUS
  Filled 2014-09-21: qty 2

## 2014-09-21 MED ORDER — HYDROMORPHONE HCL 1 MG/ML IJ SOLN
1.0000 mg | Freq: Once | INTRAMUSCULAR | Status: AC
  Administered 2014-09-21: 1 mg via INTRAVENOUS
  Filled 2014-09-21: qty 1

## 2014-09-21 MED ORDER — HYDROMORPHONE HCL 1 MG/ML IJ SOLN
0.5000 mg | Freq: Once | INTRAMUSCULAR | Status: AC
  Administered 2014-09-21: 0.5 mg via INTRAVENOUS
  Filled 2014-09-21: qty 1

## 2014-09-21 MED ORDER — NAPROXEN 375 MG PO TABS: 375.0000 mg | ORAL_TABLET | Freq: Two times a day (BID) | ORAL | Status: DC

## 2014-09-21 NOTE — ED Notes (Signed)
Spoke with vascular on phone, pt to be taken for study. Vital signs stable at the time. No signs of acute distress noted.
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
## 2014-09-21 NOTE — ED Notes (Signed)
Pt in x-ray at the time.

## 2014-09-21 NOTE — ED Notes (Signed)
Pt returned to exam room. States 5/10 back pain. He is alert and oriented x4. Family at bedside. No signs of acute distress noted.

## 2014-09-21 NOTE — ED Notes (Signed)
Pt transported for vascular study 

## 2014-09-21 NOTE — ED Notes (Signed)
EMS adm 5mg  of albuterol.

## 2014-09-21 NOTE — Discharge Instructions (Signed)
Back Injury Prevention Back injuries can be extremely painful and difficult to heal. After having one back injury, you are much more likely to experience another later on. It is important to learn how to avoid injuring or re-injuring your back. The following tips can help you to prevent a back injury. PHYSICAL FITNESS  Exercise regularly and try to develop good tone in your abdominal muscles. Your abdominal muscles provide a lot of the support needed by your back.  Do aerobic exercises (walking, jogging, biking, swimming) regularly.  Do exercises that increase balance and strength (tai chi, yoga) regularly. This can decrease your risk of falling and injuring your back.  Stretch before and after exercising.  Maintain a healthy weight. The more you weigh, the more stress is placed on your back. For every pound of weight, 10 times that amount of pressure is placed on the back. DIET  Talk to your caregiver about how much calcium and vitamin D you need per day. These nutrients help to prevent weakening of the bones (osteoporosis). Osteoporosis can cause broken (fractured) bones that lead to back pain.  Include good sources of calcium in your diet, such as dairy products, green, leafy vegetables, and products with calcium added (fortified).  Include good sources of vitamin D in your diet, such as milk and foods that are fortified with vitamin D.  Consider taking a nutritional supplement or a multivitamin if needed.  Stop smoking if you smoke. POSTURE  Sit and stand up straight. Avoid leaning forward when you sit or hunching over when you stand.  Choose chairs with good low back (lumbar) support.  If you work at a desk, sit close to your work so you do not need to lean over. Keep your chin tucked in. Keep your neck drawn back and elbows bent at a right angle. Your arms should look like the letter "L."  Sit high and close to the steering wheel when you drive. Add a lumbar support to your car  seat if needed.  Avoid sitting or standing in one position for too long. Take breaks to get up, stretch, and walk around at least once every hour. Take breaks if you are driving for long periods of time.  Sleep on your side with your knees slightly bent, or sleep on your back with a pillow under your knees. Do not sleep on your stomach. LIFTING, TWISTING, AND REACHING  Avoid heavy lifting, especially repetitive lifting. If you must do heavy lifting:  Stretch before lifting.  Work slowly.  Rest between lifts.  Use carts and dollies to move objects when possible.  Make several small trips instead of carrying 1 heavy load.  Ask for help when you need it.  Ask for help when moving big, awkward objects.  Follow these steps when lifting:  Stand with your feet shoulder-width apart.  Get as close to the object as you can. Do not try to pick up heavy objects that are far from your body.  Use handles or lifting straps if they are available.  Bend at your knees. Squat down, but keep your heels off the floor.  Keep your shoulders pulled back, your chin tucked in, and your back straight.  Lift the object slowly, tightening the muscles in your legs, abdomen, and buttocks. Keep the object as close to the center of your body as possible.  When you put a load down, use these same guidelines in reverse.  Do not:  Lift the object above your waist.  Twist at the waist while lifting or carrying a load. Move your feet if you need to turn, not your waist.  Bend over without bending at your knees.  Avoid reaching over your head, across a table, or for an object on a high surface. OTHER TIPS  Avoid wet floors and keep sidewalks clear of ice to prevent falls.  Do not sleep on a mattress that is too soft or too hard.  Keep items that are used frequently within easy reach.  Put heavier objects on shelves at waist level and lighter objects on lower or higher shelves.  Find ways to  decrease your stress, such as exercise, massage, or relaxation techniques. Stress can build up in your muscles. Tense muscles are more vulnerable to injury.  Seek treatment for depression or anxiety if needed. These conditions can increase your risk of developing back pain. SEEK MEDICAL CARE IF:  You injure your back. Back Pain, Adult Low back pain is very common. About 1 in 5 people have back pain.The cause of low back pain is rarely dangerous. The pain often gets better over time.About half of people with a sudden onset of back pain feel better in just 2 weeks. About 8 in 10 people feel better by 6 weeks.  CAUSES Some common causes of back pain include: Strain of the muscles or ligaments supporting the spine. Wear and tear (degeneration) of the spinal discs. Arthritis. Direct injury to the back. DIAGNOSIS Most of the time, the direct cause of low back pain is not known.However, back pain can be treated effectively even when the exact cause of the pain is unknown.Answering your caregiver's questions about your overall health and symptoms is one of the most accurate ways to make sure the cause of your pain is not dangerous. If your caregiver needs more information, he or she may order lab work or imaging tests (X-rays or MRIs).However, even if imaging tests show changes in your back, this usually does not require surgery. HOME CARE INSTRUCTIONS For many people, back pain returns.Since low back pain is rarely dangerous, it is often a condition that people can learn to [facility] their own.  Remain active. It is stressful on the back to sit or stand in one place. Do not sit, drive, or stand in one place for more than 30 minutes at a time. Take short walks on level surfaces as soon as pain allows.Try to increase the length of time you walk each day. Do not stay in bed.Resting more than 1 or 2 days can delay your recovery. Do not avoid exercise or work.Your body is made to move.It is not  dangerous to be active, even though your back may hurt.Your back will likely heal faster if you return to being active before your pain is gone. Pay attention to your body when you bend and lift. Many people have less discomfortwhen lifting if they bend their knees, keep the load close to their bodies,and avoid twisting. Often, the most comfortable positions are those that put less stress on your recovering back. Find a comfortable position to sleep. Use a firm mattress and lie on your side with your knees slightly bent. If you lie on your back, put a pillow under your knees. Only take over-the-counter or prescription medicines as directed by your caregiver. Over-the-counter medicines to reduce pain and inflammation are often the most helpful.Your caregiver may prescribe muscle relaxant drugs.These medicines help dull your pain so you can more quickly return to your normal activities and  healthy exercise. Put ice on the injured area. Put ice in a plastic bag. Place a towel between your skin and the bag. Leave the ice on for 15-20 minutes, 03-04 times a day for the first 2 to 3 days. After that, ice and heat may be alternated to reduce pain and spasms. Ask your caregiver about trying back exercises and gentle massage. This may be of some benefit. Avoid feeling anxious or stressed.Stress increases muscle tension and can worsen back pain.It is important to recognize when you are anxious or stressed and learn ways to manage it.Exercise is a great option. SEEK MEDICAL CARE IF: You have pain that is not relieved with rest or medicine. You have pain that does not improve in 1 week. You have new symptoms. You are generally not feeling well. SEEK IMMEDIATE MEDICAL CARE IF:  You have pain that radiates from your back into your legs. You develop new bowel or bladder control problems. You have unusual weakness or numbness in your arms or legs. You develop nausea or vomiting. You develop abdominal  pain. You feel faint. Document Released: 07/04/2005 Document Revised: 01/03/2012 Document Reviewed: 11/05/2013 [facility] Patient Information 2015 [place], [place]. This information is not intended to replace advice given to you by your health care provider. Make sure you discuss any questions you have with your health care provider.   You have questions about diet, exercise, or other ways to prevent back injuries. MAKE SURE YOU:  Understand these instructions.  Will watch your condition.  Will get help right away if you are not doing well or get worse. Document Released: 08/11/2004 Document Revised: 09/26/2011 Document Reviewed: 08/15/2011 [facility] Patient Information 2015 [place], [place]. This information is not intended to replace advice given to you by your health care provider. Make sure you discuss any questions you have with your health care provider.
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
## 2015-07-23 ENCOUNTER — Other Ambulatory Visit: Payer: Self-pay | Admitting: Orthopedic Surgery

## 2015-07-23 DIAGNOSIS — S92012K Displaced fracture of body of left calcaneus, subsequent encounter for fracture with nonunion: Secondary | ICD-10-CM

## 2015-07-27 ENCOUNTER — Ambulatory Visit
Admission: RE | Admit: 2015-07-27 | Discharge: 2015-07-27 | Disposition: A | Discharge status: Home / Self Care | Payer: 59 | Source: Ambulatory Visit | Attending: Orthopedic Surgery | Admitting: Orthopedic Surgery

## 2015-07-27 DIAGNOSIS — S92012K Displaced fracture of body of left calcaneus, subsequent encounter for fracture with nonunion: Secondary | ICD-10-CM

## 2015-08-26 ENCOUNTER — Other Ambulatory Visit: Payer: Self-pay | Admitting: Orthopedic Surgery

## 2015-08-26 DIAGNOSIS — R6889 Other general symptoms and signs: Secondary | ICD-10-CM

## 2015-08-26 DIAGNOSIS — T148XXA Other injury of unspecified body region, initial encounter: Secondary | ICD-10-CM

## 2015-09-01 ENCOUNTER — Ambulatory Visit
Admission: RE | Admit: 2015-09-01 | Discharge: 2015-09-01 | Disposition: A | Discharge status: Home / Self Care | Payer: 59 | Source: Ambulatory Visit | Attending: Orthopedic Surgery | Admitting: Orthopedic Surgery

## 2015-09-01 DIAGNOSIS — R6889 Other general symptoms and signs: Secondary | ICD-10-CM

## 2015-09-01 DIAGNOSIS — T148XXA Other injury of unspecified body region, initial encounter: Secondary | ICD-10-CM

## 2015-10-29 NOTE — Pre-Procedure Instructions (Signed)
Justin LAFAZIA  10/29/2015      [facility] [place] [place] - [id number] [place] [address] Phone: [phone] Fax: [phone]    Your procedure is scheduled on Tues, April 18 @ 8:00 AM  Report to [facility] Admitting at 6:00 AM   Call this number if you have problems the morning of surgery:  [phone]   (we are not open on the weekends)   Remember:  Do not eat food or drink liquids after midnight Monday.               Stop taking your Ibuprofen,Aspirin,along with any Vitamins or Herbal Medications. No Goody's,BC's,Aleve,Advil,Motrin,or Fish Oil.    Do not wear jewelry - no rings or watches.  Do not wear lotions, powders, or colognes.                Men may shave face and neck.  Do not bring valuables to the hospital.  [facility] is not responsible for any belongings or valuables.  Contacts, dentures or bridgework may not be worn into surgery.  Leave your suitcase in the car.  After surgery it may be brought to your room.  For patients admitted to the hospital, discharge time will be determined by your treatment team.     Special instructions:  [facility] - Preparing for Surgery  Before surgery, you can play an important role.  Because skin is not sterile, your skin needs to be as free of germs as possible.  You can reduce the number of germs on you skin by washing with CHG (chlorahexidine gluconate) soap before surgery.  CHG is an antiseptic cleaner which kills germs and bonds with the skin to continue killing germs even after washing.  Please DO NOT use if you have an allergy to CHG or antibacterial soaps.  If your skin becomes reddened/irritated stop using the CHG and inform your nurse when you arrive at Short Stay.    You may shave your face.  Please follow these instructions carefully:   1.  Shower with CHG Soap the night before surgery and the                                morning of  Surgery.  2.  If you choose to wash your hair, wash your hair first as usual with your  normal shampoo.  3.  After you shampoo, rinse your hair and body thoroughly to remove the  Shampoo.   4.  Use CHG as you would any other liquid soap.  You can apply chg directly       to the skin and wash gently with scrungie or a clean washcloth.   5.  Apply the CHG Soap to your body ONLY FROM THE NECK DOWN.  Do not use on open wounds or open sores.  Avoid contact with your eyes, ears, mouth and genitals (private parts).  Wash genitals (private parts)  with your normal soap.   6.  Wash thoroughly, paying special attention to the area where your surgery will be performed.  7.  Thoroughly rinse your body with warm water from the neck down.   8.  DO NOT shower/wash with your normal soap after using and rinsing off  the CHG Soap.  9.  Pat yourself dry with a clean towel.  10.  Wear clean pajamas.            11.  Place clean sheets on your bed the night of your first shower and do not  sleep with pets.  Day of Surgery  Do not apply any lotions/deoderants the morning of surgery.  Please wear clean clothes to the hospital/surgery center.    Please read over the following fact sheets that you were given. Pain Booklet, Coughing and Deep Breathing, MRSA Information and Surgical Site Infection Prevention

## 2015-10-30 ENCOUNTER — Other Ambulatory Visit: Payer: Self-pay

## 2015-10-30 ENCOUNTER — Encounter (HOSPITAL_COMMUNITY): Payer: Self-pay

## 2015-10-30 ENCOUNTER — Encounter (HOSPITAL_COMMUNITY): Payer: Self-pay | Admitting: Emergency Medicine

## 2015-10-30 ENCOUNTER — Encounter (HOSPITAL_COMMUNITY)
Admission: RE | Admit: 2015-10-30 | Discharge: 2015-10-30 | Disposition: A | Discharge status: Home / Self Care | Payer: 59 | Source: Ambulatory Visit | Attending: Orthopedic Surgery | Admitting: Orthopedic Surgery

## 2015-10-30 DIAGNOSIS — K219 Gastro-esophageal reflux disease without esophagitis: Secondary | ICD-10-CM | POA: Insufficient documentation

## 2015-10-30 DIAGNOSIS — G4733 Obstructive sleep apnea (adult) (pediatric): Secondary | ICD-10-CM | POA: Insufficient documentation

## 2015-10-30 DIAGNOSIS — Z79899 Other long term (current) drug therapy: Secondary | ICD-10-CM | POA: Insufficient documentation

## 2015-10-30 DIAGNOSIS — Z7982 Long term (current) use of aspirin: Secondary | ICD-10-CM | POA: Diagnosis not present

## 2015-10-30 DIAGNOSIS — M19072 Primary osteoarthritis, left ankle and foot: Secondary | ICD-10-CM | POA: Insufficient documentation

## 2015-10-30 DIAGNOSIS — F17200 Nicotine dependence, unspecified, uncomplicated: Secondary | ICD-10-CM | POA: Diagnosis not present

## 2015-10-30 DIAGNOSIS — I10 Essential (primary) hypertension: Secondary | ICD-10-CM | POA: Insufficient documentation

## 2015-10-30 DIAGNOSIS — Z01812 Encounter for preprocedural laboratory examination: Secondary | ICD-10-CM | POA: Insufficient documentation

## 2015-10-30 DIAGNOSIS — Z01818 Encounter for other preprocedural examination: Secondary | ICD-10-CM | POA: Diagnosis present

## 2015-10-30 HISTORY — DX: Unspecified osteoarthritis, unspecified site: M19.90

## 2015-10-30 LAB — COMPREHENSIVE METABOLIC PANEL
ALT: 30 U/L (ref 17–63)
AST: 23 U/L (ref 15–41)
Albumin: 4 g/dL (ref 3.5–5.0)
Alkaline Phosphatase: 91 U/L (ref 38–126)
Anion gap: 12 (ref 5–15)
BUN: 13 mg/dL (ref 6–20)
CO2: 23 mmol/L (ref 22–32)
Calcium: 9.5 mg/dL (ref 8.9–10.3)
Chloride: 108 mmol/L (ref 101–111)
Creatinine, Ser: 1.28 mg/dL — ABNORMAL HIGH (ref 0.61–1.24)
GFR calc Af Amer: >60 mL/min (ref >60)
GFR calc non Af Amer: >60 mL/min (ref >60)
Glucose, Bld: 94 mg/dL (ref 65–99)
Potassium: 3.8 mmol/L (ref 3.5–5.1)
Sodium: 143 mmol/L (ref 135–145)
Total Bilirubin: 0.8 mg/dL (ref 0.3–1.2)
Total Protein: 6.8 g/dL (ref 6.5–8.1)

## 2015-10-30 LAB — CBC WITH DIFFERENTIAL/PLATELET
Basophils Absolute: 0 10*3/uL (ref 0.0–0.1)
Basophils Relative: 0 %
Eosinophils Absolute: 0.3 10*3/uL (ref 0.0–0.7)
Eosinophils Relative: 3 %
HCT: 48 % (ref 39.0–52.0)
Hemoglobin: 16.5 g/dL (ref 13.0–17.0)
Lymphocytes Relative: 33 %
Lymphs Abs: 2.7 10*3/uL (ref 0.7–4.0)
MCH: 30.1 pg (ref 26.0–34.0)
MCHC: 34.4 g/dL (ref 30.0–36.0)
MCV: 87.4 fL (ref 78.0–100.0)
Monocytes Absolute: 0.7 10*3/uL (ref 0.1–1.0)
Monocytes Relative: 8 %
Neutro Abs: 4.7 10*3/uL (ref 1.7–7.7)
Neutrophils Relative %: 56 %
Platelets: 212 10*3/uL (ref 150–400)
RBC: 5.49 MIL/uL (ref 4.22–5.81)
RDW: 13.3 % (ref 11.5–15.5)
WBC: 8.3 10*3/uL (ref 4.0–10.5)

## 2015-10-30 LAB — URINALYSIS, ROUTINE W REFLEX MICROSCOPIC
Glucose, UA: NEGATIVE mg/dL
Hgb urine dipstick: NEGATIVE
Ketones, ur: NEGATIVE mg/dL
Leukocytes, UA: NEGATIVE
Nitrite: NEGATIVE
Protein, ur: NEGATIVE mg/dL
Specific Gravity, Urine: 1.029 (ref 1.005–1.030)
pH: 5 (ref 5.0–8.0)

## 2015-10-30 LAB — SURGICAL PCR SCREEN
MRSA, PCR: NEGATIVE
Staphylococcus aureus: NEGATIVE

## 2015-10-30 LAB — PROTIME-INR
INR: 1.05 (ref 0.00–1.49)
Prothrombin Time: 13.9 seconds (ref 11.6–15.2)

## 2015-10-30 LAB — APTT: aPTT: 30 seconds (ref 24–37)

## 2015-10-30 NOTE — Progress Notes (Signed)
Anesthesia Chart Review:   PT is a 50 year old male scheduled for L ankle arthrodesis on 11/03/2015 with Dr. Marcelino Scot.   PMH includes:  HTN (untreated), OSA (on CPAP), hx of alcohol and cocaine abuse, GERD. Current smoker. BMI 29.   Medications include: ASA, chantix  Preoperative labs reviewed.    EKG 10/30/15: Sinus rhythm with PACs with Abberant conduction  BP at PAT was high, recheck was 144/106. Pt reports he does have untreated HTN, and typically his home BP measurements are 150's/90's. He has a past history of drug abuse, reportedly clean for 1 year until last night when he used cocaine. His elevated BP is likely related to this. We had a long discussion about abstaining from further drug use until after his surgery and about seeing his PCP to get appropriate tx for HTN/consequences of untreated HTN. If pt refrains from cocaine use, I anticipate his BP will be high, but not unacceptable for surgery.   Willeen Cass, FNP-BC [facility] Short Stay Surgical Center/Anesthesiology Phone: [phone] 10/30/2015 12:19 PM

## 2015-10-30 NOTE — Progress Notes (Addendum)
Patient denies any cardiac issues.  Never had any heart studies or tests done.  Never been to cardio. PCP is Dr. Arloa Koh, [place] @ [facility] (he said in [place])  [place]ws was 2 yrs ago. Main concern is today's blood pressure.  176/103, and recheck 144/106.  He doesn't take any BP meds.  At PAT was in moderate discomfort from ankle. (will have PA evaluate) Does has some hearing loss in right ear, wears no hearing aids.

## 2015-11-02 ENCOUNTER — Encounter: Payer: Self-pay | Admitting: Family Medicine

## 2015-11-02 ENCOUNTER — Ambulatory Visit (INDEPENDENT_AMBULATORY_CARE_PROVIDER_SITE_OTHER): Payer: 59 | Admitting: Family Medicine

## 2015-11-02 VITALS — BP 156/102 | HR 79 | Temp 97.6°F | Ht 69.5 in | Wt 224.8 lb

## 2015-11-02 DIAGNOSIS — F321 Major depressive disorder, single episode, moderate: Secondary | ICD-10-CM

## 2015-11-02 DIAGNOSIS — F1411 Cocaine abuse, in remission: Secondary | ICD-10-CM

## 2015-11-02 DIAGNOSIS — F1020 Alcohol dependence, uncomplicated: Secondary | ICD-10-CM

## 2015-11-02 DIAGNOSIS — I10 Essential (primary) hypertension: Secondary | ICD-10-CM

## 2015-11-02 DIAGNOSIS — F1410 Cocaine abuse, uncomplicated: Secondary | ICD-10-CM | POA: Diagnosis not present

## 2015-11-02 DIAGNOSIS — F331 Major depressive disorder, recurrent, moderate: Secondary | ICD-10-CM

## 2015-11-02 MED ORDER — FLUOXETINE HCL 20 MG PO TABS: 20.0000 mg | ORAL_TABLET | Freq: Every day | ORAL | Status: DC

## 2015-11-02 MED ORDER — LISINOPRIL-HYDROCHLOROTHIAZIDE 10-12.5 MG PO TABS: 1.0000 | ORAL_TABLET | Freq: Every day | ORAL | Status: DC
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
## 2015-11-02 NOTE — Progress Notes (Signed)
Pre visit review using our clinic review tool, if applicable. No additional management support is needed unless otherwise documented below in the visit note. 
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
## 2015-11-03 ENCOUNTER — Encounter: Payer: Self-pay | Admitting: Family Medicine

## 2015-11-03 ENCOUNTER — Encounter (HOSPITAL_COMMUNITY): Admission: RE | Payer: Self-pay | Source: Ambulatory Visit

## 2015-11-03 ENCOUNTER — Inpatient Hospital Stay (HOSPITAL_COMMUNITY): Admission: RE | Admit: 2015-11-03 | Payer: 59 | Source: Ambulatory Visit | Admitting: Orthopedic Surgery

## 2015-11-03 DIAGNOSIS — F1410 Cocaine abuse, uncomplicated: Secondary | ICD-10-CM

## 2015-11-03 DIAGNOSIS — F1411 Cocaine abuse, in remission: Secondary | ICD-10-CM

## 2015-11-03 DIAGNOSIS — F1011 Alcohol abuse, in remission: Secondary | ICD-10-CM

## 2015-11-03 HISTORY — DX: Alcohol abuse, in remission: F10.11

## 2015-11-03 HISTORY — DX: Cocaine abuse, uncomplicated: F14.10

## 2015-11-03 SURGERY — ARTHRODESIS ANKLE
Anesthesia: General | Laterality: Left

## 2015-11-09 ENCOUNTER — Ambulatory Visit (INDEPENDENT_AMBULATORY_CARE_PROVIDER_SITE_OTHER): Payer: 59 | Admitting: Family Medicine

## 2015-11-09 ENCOUNTER — Encounter: Payer: Self-pay | Admitting: Family Medicine

## 2015-11-09 VITALS — BP 140/84 | HR 81 | Temp 97.5°F | Ht 69.5 in | Wt 231.2 lb

## 2015-11-09 DIAGNOSIS — I10 Essential (primary) hypertension: Secondary | ICD-10-CM | POA: Diagnosis not present

## 2015-11-09 DIAGNOSIS — F1410 Cocaine abuse, uncomplicated: Secondary | ICD-10-CM

## 2015-11-09 DIAGNOSIS — F331 Major depressive disorder, recurrent, moderate: Secondary | ICD-10-CM

## 2015-11-09 DIAGNOSIS — F1020 Alcohol dependence, uncomplicated: Secondary | ICD-10-CM

## 2015-11-09 DIAGNOSIS — F1411 Cocaine abuse, in remission: Secondary | ICD-10-CM

## 2015-11-09 MED ORDER — LISINOPRIL-HYDROCHLOROTHIAZIDE 20-12.5 MG PO TABS: 1.0000 | ORAL_TABLET | Freq: Every day | ORAL | Status: DC
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
## 2015-11-09 NOTE — Progress Notes (Signed)
Pre visit review using our clinic review tool, if applicable. No additional management support is needed unless otherwise documented below in the visit note. 

## 2015-11-20 ENCOUNTER — Encounter (HOSPITAL_COMMUNITY): Payer: Self-pay | Admitting: *Deleted

## 2015-11-23 ENCOUNTER — Encounter (HOSPITAL_COMMUNITY): Payer: Self-pay | Admitting: Anesthesiology
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
## 2015-11-23 NOTE — Interval H&P Note (Signed)
History and Physical Interval Note:  11/23/2015 2:53 PM  [name]  has presented today for surgery, with the diagnosis of left ankle arthritis  The various methods of treatment have been discussed with the patient and family. After consideration of risks, benefits and other options for treatment, the patient has consented to  Procedure(s): LEFT TIBIO TALAR ARTHRODESIS  (Left) as a surgical intervention .  The patient's history has been reviewed, patient examined, no change in status, stable for surgery.  I have reviewed the patient's chart and labs.  Questions were answered to the patient's satisfaction.    Pt has been seen by his PCP for HTN and has given clearance We have discussed pts cocaine use, he had been clean prior to his "send off" party  Pt assures abstinence   Jari Pigg, PA-C [facility] [phone] (P) 11/23/2015 2:54 PM    Jari Pigg

## 2015-11-24 ENCOUNTER — Encounter (HOSPITAL_COMMUNITY)
Admission: RE | Disposition: A | Discharge status: Home / Self Care | Payer: Self-pay | Source: Ambulatory Visit | Attending: Orthopedic Surgery

## 2015-11-24 ENCOUNTER — Ambulatory Visit (HOSPITAL_COMMUNITY)
Admission: RE | Admit: 2015-11-24 | Discharge: 2015-11-24 | Disposition: A | Discharge status: Home / Self Care | Payer: 59 | Source: Ambulatory Visit | Attending: Orthopedic Surgery | Admitting: Orthopedic Surgery

## 2015-11-24 ENCOUNTER — Encounter (HOSPITAL_COMMUNITY): Payer: Self-pay | Admitting: Certified Registered"

## 2015-11-24 DIAGNOSIS — Z8249 Family history of ischemic heart disease and other diseases of the circulatory system: Secondary | ICD-10-CM | POA: Insufficient documentation

## 2015-11-24 DIAGNOSIS — G4730 Sleep apnea, unspecified: Secondary | ICD-10-CM | POA: Insufficient documentation

## 2015-11-24 DIAGNOSIS — K219 Gastro-esophageal reflux disease without esophagitis: Secondary | ICD-10-CM | POA: Insufficient documentation

## 2015-11-24 DIAGNOSIS — Z823 Family history of stroke: Secondary | ICD-10-CM | POA: Insufficient documentation

## 2015-11-24 DIAGNOSIS — Z91041 Radiographic dye allergy status: Secondary | ICD-10-CM | POA: Diagnosis not present

## 2015-11-24 DIAGNOSIS — Z91040 Latex allergy status: Secondary | ICD-10-CM | POA: Diagnosis not present

## 2015-11-24 DIAGNOSIS — M19072 Primary osteoarthritis, left ankle and foot: Secondary | ICD-10-CM | POA: Insufficient documentation

## 2015-11-24 DIAGNOSIS — Z880 Allergy status to penicillin: Secondary | ICD-10-CM | POA: Insufficient documentation

## 2015-11-24 DIAGNOSIS — I10 Essential (primary) hypertension: Secondary | ICD-10-CM | POA: Diagnosis not present

## 2015-11-24 DIAGNOSIS — F17210 Nicotine dependence, cigarettes, uncomplicated: Secondary | ICD-10-CM | POA: Insufficient documentation

## 2015-11-24 HISTORY — DX: Major depressive disorder, single episode, unspecified: F32.9

## 2015-11-24 HISTORY — DX: Depression, unspecified: F32.A

## 2015-11-24 LAB — CBC WITH DIFFERENTIAL/PLATELET
Basophils Absolute: 0 10*3/uL (ref 0.0–0.1)
Basophils Relative: 0 %
Eosinophils Absolute: 0.4 10*3/uL (ref 0.0–0.7)
Eosinophils Relative: 6 %
HCT: 45.5 % (ref 39.0–52.0)
Hemoglobin: 15.4 g/dL (ref 13.0–17.0)
Lymphocytes Relative: 27 %
Lymphs Abs: 2.1 10*3/uL (ref 0.7–4.0)
MCH: 29.9 pg (ref 26.0–34.0)
MCHC: 33.8 g/dL (ref 30.0–36.0)
MCV: 88.3 fL (ref 78.0–100.0)
Monocytes Absolute: 0.6 10*3/uL (ref 0.1–1.0)
Monocytes Relative: 8 %
Neutro Abs: 4.6 10*3/uL (ref 1.7–7.7)
Neutrophils Relative %: 59 %
Platelets: 207 10*3/uL (ref 150–400)
RBC: 5.15 MIL/uL (ref 4.22–5.81)
RDW: 12.9 % (ref 11.5–15.5)
WBC: 7.7 10*3/uL (ref 4.0–10.5)

## 2015-11-24 LAB — COMPREHENSIVE METABOLIC PANEL
ALT: 26 U/L (ref 17–63)
AST: 19 U/L (ref 15–41)
Albumin: 3.7 g/dL (ref 3.5–5.0)
Alkaline Phosphatase: 96 U/L (ref 38–126)
Anion gap: 15 (ref 5–15)
BUN: 19 mg/dL (ref 6–20)
CO2: 22 mmol/L (ref 22–32)
Calcium: 8.9 mg/dL (ref 8.9–10.3)
Chloride: 102 mmol/L (ref 101–111)
Creatinine, Ser: 1.17 mg/dL (ref 0.61–1.24)
GFR calc Af Amer: >60 mL/min (ref >60)
GFR calc non Af Amer: >60 mL/min (ref >60)
Glucose, Bld: 115 mg/dL — ABNORMAL HIGH (ref 65–99)
Potassium: 4.3 mmol/L (ref 3.5–5.1)
Sodium: 139 mmol/L (ref 135–145)
Total Bilirubin: 0.8 mg/dL (ref 0.3–1.2)
Total Protein: 6.5 g/dL (ref 6.5–8.1)

## 2015-11-24 LAB — PROTIME-INR
INR: 1.06 (ref 0.00–1.49)
Prothrombin Time: 14 seconds (ref 11.6–15.2)

## 2015-11-24 LAB — URINALYSIS, ROUTINE W REFLEX MICROSCOPIC
Bilirubin Urine: NEGATIVE
Glucose, UA: NEGATIVE mg/dL
Hgb urine dipstick: NEGATIVE
Ketones, ur: NEGATIVE mg/dL
Leukocytes, UA: NEGATIVE
Nitrite: NEGATIVE
Protein, ur: NEGATIVE mg/dL
Specific Gravity, Urine: 1.027 (ref 1.005–1.030)
pH: 5 (ref 5.0–8.0)

## 2015-11-24 LAB — RAPID URINE DRUG SCREEN, HOSP PERFORMED
Amphetamines: NOT DETECTED
Barbiturates: NOT DETECTED
Benzodiazepines: NOT DETECTED
Cocaine: POSITIVE — AB
Opiates: NOT DETECTED
Tetrahydrocannabinol: NOT DETECTED

## 2015-11-24 LAB — APTT: aPTT: 31 seconds (ref 24–37)

## 2015-11-24 SURGERY — ARTHRODESIS ANKLE
Anesthesia: General | Site: Ankle | Laterality: Left

## 2015-11-24 MED ORDER — VANCOMYCIN HCL 10 G IV SOLR
1500.0000 mg | INTRAVENOUS | Status: DC
  Filled 2015-11-24: qty 1500

## 2015-11-24 MED ORDER — POTASSIUM CHLORIDE 2 MEQ/ML IV SOLN
INTRAVENOUS | Status: DC
  Filled 2015-11-24: qty 1000

## 2015-11-24 MED ORDER — CEFAZOLIN SODIUM-DEXTROSE 2-4 GM/100ML-% IV SOLN: 2.0000 g | INTRAVENOUS | Status: DC

## 2015-11-24 MED ORDER — MIDAZOLAM HCL 2 MG/2ML IJ SOLN
INTRAMUSCULAR | Status: AC
  Filled 2015-11-24: qty 2

## 2015-11-24 MED ORDER — FENTANYL CITRATE (PF) 250 MCG/5ML IJ SOLN
INTRAMUSCULAR | Status: AC
  Filled 2015-11-24: qty 5

## 2015-11-24 MED ORDER — CHLORHEXIDINE GLUCONATE 4 % EX LIQD: 60.0000 mL | Freq: Once | CUTANEOUS | Status: DC

## 2015-11-24 MED ORDER — PROPOFOL 10 MG/ML IV BOLUS
INTRAVENOUS | Status: AC
  Filled 2015-11-24: qty 20

## 2015-11-24 NOTE — Progress Notes (Signed)
Pt. Reports that Dr. Marcelino Scot has instructed pt. To be in touch with his family doctor today- he verbalizes understanding.
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
## 2015-11-24 NOTE — Anesthesia Preprocedure Evaluation (Deleted)
Anesthesia Evaluation  Patient identified by MRN, date of birth, ID band Patient awake    Reviewed: Allergy & Precautions, H&P , NPO status , Patient's Chart, lab work & pertinent test results  Airway Mallampati: II   Neck ROM: full    Dental  (+) Teeth Intact, Missing, Dental Advisory Given,    Pulmonary sleep apnea and Continuous Positive Airway Pressure Ventilation , Current Smoker, former smoker,           Cardiovascular hypertension, Pt. on medications + Peripheral Vascular Disease       Neuro/Psych PSYCHIATRIC DISORDERS Depression    GI/Hepatic GERD  ,(+)     substance abuse  alcohol use and cocaine use,   Endo/Other    Renal/GU      Musculoskeletal  (+) Arthritis ,   Abdominal   Peds  Hematology   Anesthesia Other Findings Chronic back pain  Last case canceled due to elevated BP in setting of cocaine abuse  Reproductive/Obstetrics                            Anesthesia Physical  Anesthesia Plan  ASA: II  Anesthesia Plan: General and Regional   Post-op Pain Management: GA combined w/ Regional for post-op pain   Induction: Intravenous  Airway Management Planned: LMA and Oral ETT  Additional Equipment:   Intra-op Plan:   Post-operative Plan: Extubation in OR  Informed Consent: I have reviewed the patients History and Physical, chart, labs and discussed the procedure including the risks, benefits and alternatives for the proposed anesthesia with the patient or authorized representative who has indicated his/her understanding and acceptance.     Plan Discussed with: CRNA, Anesthesiologist and Surgeon  Anesthesia Plan Comments:         Anesthesia Quick Evaluation

## 2015-11-24 NOTE — Progress Notes (Signed)
Report to Dr. Jillyn Hidden, regarding BP.

## 2015-11-25 ENCOUNTER — Ambulatory Visit (INDEPENDENT_AMBULATORY_CARE_PROVIDER_SITE_OTHER): Payer: 59 | Admitting: Family Medicine

## 2015-11-25 ENCOUNTER — Encounter: Payer: Self-pay | Admitting: Family Medicine

## 2015-11-25 VITALS — BP 127/82 | HR 82 | Temp 97.6°F | Ht 69.5 in | Wt 232.2 lb

## 2015-11-25 DIAGNOSIS — I10 Essential (primary) hypertension: Secondary | ICD-10-CM

## 2015-11-25 DIAGNOSIS — F1410 Cocaine abuse, uncomplicated: Secondary | ICD-10-CM | POA: Diagnosis not present

## 2015-11-25 DIAGNOSIS — F1411 Cocaine abuse, in remission: Secondary | ICD-10-CM

## 2015-11-25 MED ORDER — AMLODIPINE BESYLATE 5 MG PO TABS: 5.0000 mg | ORAL_TABLET | Freq: Every day | ORAL | Status: DC

## 2015-11-25 NOTE — Progress Notes (Signed)
Pre visit review using our clinic review tool, if applicable. No additional management support is needed unless otherwise documented below in the visit note. 
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
## 2015-11-27 ENCOUNTER — Telehealth: Payer: Self-pay | Admitting: Family Medicine

## 2015-11-27 NOTE — Telephone Encounter (Signed)
TH advised pt if severe CP go to ED otherwise keep appt with Dr Lorelei Pont on 11/30/15 at 12:15.

## 2015-11-27 NOTE — Telephone Encounter (Signed)
Agree - will recheck on Monday. Doubtful from calcium channel blocker. Recent cocaine use. Will discuss with him in full at [facility]

## 2015-11-27 NOTE — Telephone Encounter (Signed)
[facility]  Patient Name: [name]  Gender: Male  DOB: 12/29/1965   Age: 50 Y 19 D  Return Phone Number: [phone] (Primary), [phone] (Secondary)  Address:   City/State/Zip: [place] [address]   Client [facility] - Client  Client Site [facility] - Day  Physician Copland, Frederico Hamman - MD  Contact Type Call  Who Is Calling Patient / Member / Family / Caregiver  Call Type Triage / Clinical  Relationship To Patient Self  Return Phone Number [phone] (Primary)  Chief Complaint CHEST PAIN (>=21 years) - pain, pressure, heaviness or tightness  Reason for Call Symptomatic / Request for [place] states he started different bp medication yesterday, started having chest pressure and dizziness afterward, bp was 160/106then but lower now  Appointment Disposition EMR Appointment Scheduled  Info pasted into Epic Yes  PreDisposition Call Doctor  Translation No   Nurse Assessment  Nurse: Wynetta Emery, RN, Baker Janus Date/Time (Eastern Time): 11/27/2015 9:16:25 AM  Confirm and document reason for call. If symptomatic, describe symptoms. You must click the next button to save text entered. ---[name] had pressure in chest and dizziness at 230pm and took new B/P at 7am yesterday -- blood pressure today is 140/92 before bed 145/95 again, took new blood pressure medication at this time 919am 151/92 pulse 86  Has the patient traveled out of the country within the last 30 days? ---No  Does the patient have any new or worsening symptoms? ---Yes  Will a triage be completed? ---Yes  Related visit to physician within the last 2 weeks? ---Yes  Does the PT have any chronic conditions? (i.e. diabetes, asthma, etc.) ---Yes  List chronic conditions. ---B/P  Is this a behavioral health or substance abuse call? ---No     Guidelines       Guideline Title Affirmed Question Affirmed Notes Nurse Date/Time (Eastern Time)  High Blood Pressure [1] BP ? 140/90 AND [2] taking BP medications  Ivin Booty 11/27/2015 9:20:22 AM   Disp. Time Eilene Ghazi Time) Disposition Final User   11/27/2015 9:13:02 AM Send to Urgent Queue  Dalia Heading    11/27/2015 9:30:11 AM See Physician within 24 Hours Yes Wynetta Emery, RN, Baker Janus    Disposition Overriden: See PCP within 2 Weeks  Override Reason: Patients symptoms need a higher level of care  Caller Understands: Yes  Disagree/Comply: Comply     Care Advice Given Per Guideline      SEE PHYSICIAN WITHIN 24 HOURS: * IF OFFICE WILL BE CLOSED AND NO PCP TRIAGE: You need to be seen within the next 24 hours. An urgent care center is often a good source of care if your doctor's office is closed. * Untreated high blood pressure may cause damage to your heart, brain, kidneys, and eyes. * Treatment of high blood pressure can reduce the risk of stroke, heart attack, and heart failure. * The goal of blood pressure treatment for most people with hypertension is to keep the blood pressure under 140/90. For people that are 60 years or older, your doctor may instead want to keep the blood pressure under 150/90. * Weakness or numbness of the face, arm or leg on one side of the body occurs * Difficulty walking, difficulty talking, or severe headache occurs * Chest pain or difficulty breathing occurs * You become worse. CARE ADVICE given per High  Blood Pressure (Adult) guideline.   Comments  User: Michele Rockers, RN Date/Time Eilene Ghazi Time): 11/27/2015 9:33:37 AM  NOTE: he was planting a tree (digging a hole) to plant tree when he had chest pain/tightness and dizziness laid down on couch and woke up and was better afterwards worried it related to medication he started yesterday - no pain this am no dizziness this am advised to go to [facility] tomorrow if he has any symptoms again if not see md on Monday  User: Michele Rockers, RN  Date/Time (Eastern Time): 11/27/2015 9:36:53 AM  NOTE: advised to go to [facility] for symptoms or if having severe chest pain to ED -- if not keep appt with Dr. Lorelei Pont on Monday at 1215pm for one episode of chest pain/dizziness with exertion   Referrals  REFERRED TO PCP OFFICE

## 2015-11-30 ENCOUNTER — Ambulatory Visit (INDEPENDENT_AMBULATORY_CARE_PROVIDER_SITE_OTHER): Payer: 59 | Admitting: Family Medicine

## 2015-11-30 ENCOUNTER — Encounter: Payer: Self-pay | Admitting: Family Medicine

## 2015-11-30 VITALS — BP 144/84 | HR 84 | Temp 97.7°F | Ht 69.5 in | Wt 236.5 lb

## 2015-11-30 DIAGNOSIS — R0789 Other chest pain: Secondary | ICD-10-CM | POA: Diagnosis not present

## 2015-11-30 DIAGNOSIS — R55 Syncope and collapse: Secondary | ICD-10-CM | POA: Diagnosis not present

## 2015-11-30 DIAGNOSIS — F17200 Nicotine dependence, unspecified, uncomplicated: Secondary | ICD-10-CM

## 2015-11-30 DIAGNOSIS — I10 Essential (primary) hypertension: Secondary | ICD-10-CM

## 2015-11-30 NOTE — Progress Notes (Signed)
Pre visit review using our clinic review tool, if applicable. No additional management support is needed unless otherwise documented below in the visit note. 

## 2015-11-30 NOTE — Patient Instructions (Addendum)
Definitely keep taking your LISINOPRIL/HCTZ AND YOUR AMLODIPINE Take both at the same time   REFERRALS TO SPECIALISTS, SPECIAL TESTS (MRI, CT, ULTRASOUNDS)  [facility] or [facility] will help you. ASK CHECK-IN FOR HELP.  Imaging / Special Testing referrals sometimes can be done same day if EMERGENCY, but others can take 2 or 3 days to get an appointment. Starting in 2015, many of the new Medicare plans and Obamacare plans take much longer.   Specialist appointment times vary a great deal, based on their schedule / openings. -- Some specialists have very long wait times. (Example. Dermatology. Multiple months  for non-cancer)
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
## 2015-12-02 ENCOUNTER — Encounter (INDEPENDENT_AMBULATORY_CARE_PROVIDER_SITE_OTHER): Payer: Self-pay

## 2015-12-02 ENCOUNTER — Ambulatory Visit (INDEPENDENT_AMBULATORY_CARE_PROVIDER_SITE_OTHER): Payer: 59 | Admitting: Cardiology

## 2015-12-02 ENCOUNTER — Encounter: Payer: Self-pay | Admitting: Cardiology

## 2015-12-02 VITALS — BP 140/78 | HR 84 | Ht 72.0 in | Wt 235.0 lb

## 2015-12-02 DIAGNOSIS — Z01810 Encounter for preprocedural cardiovascular examination: Secondary | ICD-10-CM | POA: Diagnosis not present

## 2015-12-02 DIAGNOSIS — R079 Chest pain, unspecified: Secondary | ICD-10-CM

## 2015-12-02 DIAGNOSIS — I10 Essential (primary) hypertension: Secondary | ICD-10-CM | POA: Diagnosis not present

## 2015-12-02 DIAGNOSIS — R55 Syncope and collapse: Secondary | ICD-10-CM | POA: Diagnosis not present

## 2015-12-02 DIAGNOSIS — R42 Dizziness and giddiness: Secondary | ICD-10-CM | POA: Diagnosis not present

## 2015-12-02 DIAGNOSIS — R0602 Shortness of breath: Secondary | ICD-10-CM | POA: Diagnosis not present

## 2015-12-02 NOTE — Patient Instructions (Addendum)
Medication Instructions:  Your physician recommends that you continue on your current medications as directed. Please refer to the Current Medication list given to you today.   Labwork: None ordered  Testing/Procedures: Your physician has requested that you have an echocardiogram. Echocardiography is a painless test that uses sound waves to create images of your heart. It provides your doctor with information about the size and shape of your heart and how well your heart's chambers and valves are working. This procedure takes approximately one hour. There are no restrictions for this procedure.  Date & Time: _____________________________________________________________________  [facility]  Your caregiver has ordered a Stress Test with nuclear imaging. The purpose of this test is to evaluate the blood supply to your heart muscle. This procedure is referred to as a "Non-Invasive Stress Test." This is because other than having an IV started in your vein, nothing is inserted or "invades" your body. Cardiac stress tests are done to find areas of poor blood flow to the heart by determining the extent of coronary artery disease (CAD). Some patients exercise on a treadmill, which naturally increases the blood flow to your heart, while others who are  unable to walk on a treadmill due to physical limitations have a pharmacologic/chemical stress agent called Lexiscan . This medicine will mimic walking on a treadmill by temporarily increasing your coronary blood flow.   Please note: these test may take anywhere between 2-4 hours to complete  PLEASE REPORT TO [facility] AT THE FIRST DESK WILL DIRECT YOU WHERE TO GO  Date of Procedure:__Wednesday Dec 09, 2015 at 08:30AM_________  Arrival Time for Procedure:__Arrive at 08:15AM_________  Instructions regarding medication:   __X__ : Hold diabetes medication morning of procedure  ____:  Hold betablocker(s) night before  procedure and morning of procedure    PLEASE NOTIFY THE OFFICE AT LEAST 24 HOURS IN ADVANCE IF YOU ARE UNABLE TO KEEP YOUR APPOINTMENT.  [phone] AND  PLEASE NOTIFY NUCLEAR MEDICINE AT [facility] AT LEAST 24 HOURS IN ADVANCE IF YOU ARE UNABLE TO KEEP YOUR APPOINTMENT. [phone]  How to prepare for your Myoview test:   Do not eat or drink after midnight  No caffeine for 24 hours prior to test  No smoking 24 hours prior to test.  Your medication may be taken with water.  If your doctor stopped a medication because of this test, do not take that medication.  Ladies, please do not wear dresses.  Skirts or pants are appropriate. Please wear a short sleeve shirt.  No perfume, cologne or lotion.  Wear comfortable walking shoes. No heels!    Follow-Up: Your physician recommends that you schedule a follow-up appointment after testing to review results.  Date & Time: _________________________________________________________________   Any Other Special Instructions Will Be Listed Below (If Applicable).     If you need a refill on your cardiac medications before your next appointment, please call your pharmacy.  Echocardiogram An echocardiogram, or echocardiography, uses sound waves (ultrasound) to produce an image of your heart. The echocardiogram is simple, painless, obtained within a short period of time, and offers valuable information to your health care provider. The images from an echocardiogram can provide information such as:  Evidence of coronary artery disease (CAD).  Heart size.  Heart muscle function.  Heart valve function.  Aneurysm detection.  Evidence of a past heart attack.  Fluid buildup around the heart.  Heart muscle thickening.  Assess heart valve function. [place]  KNOW ABOUT:  Any allergies you have.  All medicines you are taking, including vitamins, herbs, eye drops, creams, and over-the-counter medicines.  Previous  problems you or members of your family have had with the use of anesthetics.  Any blood disorders you have.  Previous surgeries you have had.  Medical conditions you have.  Possibility of pregnancy, if this applies. BEFORE THE PROCEDURE  No special preparation is needed. Eat and drink normally.  PROCEDURE   In order to produce an image of your heart, gel will be applied to your chest and a wand-like tool (transducer) will be moved over your chest. The gel will help transmit the sound waves from the transducer. The sound waves will harmlessly bounce off your heart to allow the heart images to be captured in real-time motion. These images will then be recorded.  You may need an IV to receive a medicine that improves the quality of the pictures. AFTER THE PROCEDURE You may return to your normal schedule including diet, activities, and medicines, unless your health care provider tells you otherwise.   This information is not intended to replace advice given to you by your health care provider. Make sure you discuss any questions you have with your health care provider.   Document Released: 07/01/2000 Document Revised: 07/25/2014 Document Reviewed: 03/11/2013 Elsevier Interactive Patient Education 2016 [place] [place] [employer].   Pharmacologic Stress Electrocardiogram A pharmacologic stress electrocardiogram is a heart (cardiac) test that uses nuclear imaging to evaluate the blood supply to your heart. This test may also be called a pharmacologic stress electrocardiography. Pharmacologic means that a medicine is used to increase your heart rate and blood pressure.  This stress test is done to find areas of poor blood flow to the heart by determining the extent of coronary artery disease (CAD). Some people exercise on a treadmill, which naturally increases the blood flow to the heart. For those people unable to exercise on a treadmill, a medicine is used. This medicine stimulates your heart and will cause  your heart to beat harder and more quickly, as if you were exercising.  Pharmacologic stress tests can help determine:  The adequacy of blood flow to your heart during increased levels of activity in order to clear you for discharge home.  The extent of coronary artery blockage caused by CAD.  Your prognosis if you have suffered a heart attack.  The effectiveness of cardiac procedures done, such as an angioplasty, which can increase the circulation in your coronary arteries.  Causes of chest pain or pressure. LET [facility] CARE PROVIDER KNOW ABOUT:  Any allergies you have.  All medicines you are taking, including vitamins, herbs, eye drops, creams, and over-the-counter medicines.  Previous problems you or members of your family have had with the use of anesthetics.  Any blood disorders you have.  Previous surgeries you have had.  Medical conditions you have.  Possibility of pregnancy, if this applies.  If you are currently breastfeeding. RISKS AND COMPLICATIONS Generally, this is a safe procedure. However, as with any procedure, complications can occur. Possible complications include:  You develop pain or pressure in the following areas:  Chest.  Jaw or neck.  Between your shoulder blades.  Radiating down your left arm.  Headache.  Dizziness or light-headedness.  Shortness of breath.  Increased or irregular heartbeat.  Low blood pressure.  Nausea or vomiting.  Flushing.  Redness going up the arm and slight pain during injection of medicine.  Heart attack (rare). BEFORE THE PROCEDURE  Avoid all forms of caffeine for 24 hours before your test or as directed by your health care provider. This includes coffee, tea (even decaffeinated tea), caffeinated sodas, chocolate, cocoa, and certain pain medicines.  Follow your health care provider's instructions regarding eating and drinking before the test.  Take your medicines as directed at regular times with  water unless instructed otherwise. Exceptions may include:  If you have diabetes, ask how you are to take your insulin or pills. It is common to adjust insulin dosing the morning of the test.  If you are taking beta-blocker medicines, it is important to talk to your health care provider about these medicines well before the date of your test. Taking beta-blocker medicines may interfere with the test. In some cases, these medicines need to be changed or stopped 24 hours or more before the test.  If you wear a nitroglycerin patch, it may need to be removed prior to the test. Ask your health care provider if the patch should be removed before the test.  If you use an inhaler for any breathing condition, bring it with you to the test.  If you are an outpatient, bring a snack so you can eat right after the stress phase of the test.  Do not smoke for 4 hours prior to the test or as directed by your health care provider.  Do not apply lotions, powders, creams, or oils on your chest prior to the test.  Wear comfortable shoes and clothing. Let your health care provider know if you were unable to complete or follow the preparations for your test. PROCEDURE   Multiple patches (electrodes) will be put on your chest. If needed, small areas of your chest may be shaved to get better contact with the electrodes. Once the electrodes are attached to your body, multiple wires will be attached to the electrodes, and your heart rate will be monitored.  An IV access will be started. A nuclear trace (isotope) is given. The isotope may be given intravenously, or it may be swallowed. Nuclear refers to several types of radioactive isotopes, and the nuclear isotope lights up the arteries so that the nuclear images are clear. The isotope is absorbed by your body. This results in low radiation exposure.  A resting nuclear image is taken to show how your heart functions at rest.  A medicine is given through the IV  access.  A second scan is done about 1 hour after the medicine injection and determines how your heart functions under stress.  During this stress phase, you will be connected to an electrocardiogram machine. Your blood pressure and oxygen levels will be monitored. AFTER THE PROCEDURE   Your heart rate and blood pressure will be monitored after the test.  You may return to your normal schedule, including diet,activities, and medicines, unless your health care provider tells you otherwise.   This information is not intended to replace advice given to you by your health care provider. Make sure you discuss any questions you have with your health care provider.   Document Released: 11/20/2008 Document Revised: 07/09/2013 Document Reviewed: 03/11/2013 Elsevier Interactive Patient Education [employer].
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
## 2015-12-09 ENCOUNTER — Encounter
Admission: RE | Admit: 2015-12-09 | Discharge: 2015-12-09 | Disposition: A | Discharge status: Home / Self Care | Payer: 59 | Source: Ambulatory Visit | Attending: Cardiology | Admitting: Cardiology

## 2015-12-09 DIAGNOSIS — R0602 Shortness of breath: Secondary | ICD-10-CM | POA: Diagnosis present

## 2015-12-09 DIAGNOSIS — R079 Chest pain, unspecified: Secondary | ICD-10-CM

## 2015-12-09 DIAGNOSIS — I10 Essential (primary) hypertension: Secondary | ICD-10-CM

## 2015-12-09 MED ORDER — TECHNETIUM TC 99M TETROFOSMIN IV KIT
32.9000 | PACK | Freq: Once | INTRAVENOUS | Status: AC | PRN
  Administered 2015-12-09: 32.9 via INTRAVENOUS

## 2015-12-09 MED ORDER — TECHNETIUM TC 99M TETROFOSMIN IV KIT
13.0000 | PACK | Freq: Once | INTRAVENOUS | Status: AC | PRN
  Administered 2015-12-09: 13.84 via INTRAVENOUS

## 2015-12-09 MED ORDER — REGADENOSON 0.4 MG/5ML IV SOLN
0.4000 mg | Freq: Once | INTRAVENOUS | Status: AC
  Administered 2015-12-09: 0.4 mg via INTRAVENOUS

## 2015-12-10 LAB — NM MYOCAR MULTI W/SPECT W/WALL MOTION / EF
Estimated workload: 1 METS
LV dias vol: 102 mL (ref 62–150)
LV sys vol: 38 mL
Peak HR: 93 {beats}/min
Percent HR: 58 %
Percent of predicted max HR: 54 %
Rest HR: 65 {beats}/min
SDS: 0
SRS: 1
SSS: 0
Stage 1 HR: 65 {beats}/min
Stage 2 Grade: 0 %
Stage 2 HR: 71 {beats}/min
Stage 2 Speed: 0 mph
Stage 3 Grade: 0 %
Stage 3 HR: 71 {beats}/min
Stage 3 Speed: 0 mph
Stage 4 Grade: 0 %
Stage 4 HR: 93 {beats}/min
Stage 4 Speed: 0 mph
Stage 5 Grade: 0 %
Stage 5 HR: 93 {beats}/min
Stage 5 Speed: 0 mph
Stage 6 DBP: 70 mmHg
Stage 6 Grade: 0 %
Stage 6 HR: 82 {beats}/min
Stage 6 SBP: 131 mmHg
Stage 6 Speed: 0 mph
TID: 0.95

## 2015-12-18 ENCOUNTER — Encounter (INDEPENDENT_AMBULATORY_CARE_PROVIDER_SITE_OTHER): Payer: Self-pay

## 2015-12-18 ENCOUNTER — Ambulatory Visit (INDEPENDENT_AMBULATORY_CARE_PROVIDER_SITE_OTHER): Payer: 59

## 2015-12-18 ENCOUNTER — Other Ambulatory Visit: Payer: Self-pay

## 2015-12-18 DIAGNOSIS — I10 Essential (primary) hypertension: Secondary | ICD-10-CM

## 2015-12-18 DIAGNOSIS — R079 Chest pain, unspecified: Secondary | ICD-10-CM | POA: Diagnosis not present

## 2015-12-18 DIAGNOSIS — R0602 Shortness of breath: Secondary | ICD-10-CM | POA: Diagnosis not present

## 2015-12-21 ENCOUNTER — Ambulatory Visit: Payer: Self-pay | Admitting: Family Medicine

## 2015-12-28 ENCOUNTER — Ambulatory Visit (INDEPENDENT_AMBULATORY_CARE_PROVIDER_SITE_OTHER): Payer: 59 | Admitting: Cardiology

## 2015-12-28 ENCOUNTER — Encounter: Payer: Self-pay | Admitting: Cardiology

## 2015-12-28 VITALS — BP 130/90 | HR 91 | Ht 72.0 in | Wt 233.0 lb

## 2015-12-28 DIAGNOSIS — Z720 Tobacco use: Secondary | ICD-10-CM

## 2015-12-28 DIAGNOSIS — I10 Essential (primary) hypertension: Secondary | ICD-10-CM | POA: Diagnosis not present

## 2015-12-28 DIAGNOSIS — Z01810 Encounter for preprocedural cardiovascular examination: Secondary | ICD-10-CM

## 2015-12-28 NOTE — Patient Instructions (Signed)
Medication Instructions:  Your physician recommends that you continue on your current medications as directed. Please refer to the Current Medication list given to you today.  Labwork: None ordered.  Testing/Procedures: None ordered.  Follow-Up: Your physician recommends that you schedule a follow-up appointment as needed.   Any Other Special Instructions Will Be Listed Below (If Applicable).     If you need a refill on your cardiac medications before your next appointment, please call your pharmacy.   
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
## 2016-02-01 ENCOUNTER — Encounter: Payer: Self-pay | Admitting: Family Medicine

## 2016-02-01 ENCOUNTER — Ambulatory Visit (INDEPENDENT_AMBULATORY_CARE_PROVIDER_SITE_OTHER): Payer: 59 | Admitting: Family Medicine

## 2016-02-01 VITALS — BP 128/74 | HR 87 | Temp 97.7°F | Ht 72.0 in | Wt 235.2 lb

## 2016-02-01 DIAGNOSIS — I10 Essential (primary) hypertension: Secondary | ICD-10-CM | POA: Diagnosis not present

## 2016-02-01 DIAGNOSIS — G4700 Insomnia, unspecified: Secondary | ICD-10-CM | POA: Insufficient documentation

## 2016-02-01 DIAGNOSIS — F321 Major depressive disorder, single episode, moderate: Secondary | ICD-10-CM

## 2016-02-01 DIAGNOSIS — F331 Major depressive disorder, recurrent, moderate: Secondary | ICD-10-CM

## 2016-02-01 MED ORDER — FLUOXETINE HCL 40 MG PO CAPS: 40.0000 mg | ORAL_CAPSULE | Freq: Every day | ORAL | Status: DC
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
## 2016-02-01 NOTE — Patient Instructions (Signed)
Insomnia:  Try this first: Melatonin up to 10 mg can be taken 1 hour before sleep very safely every day  If that does not work, then you can try one of these.   Antihistamines: 2 tabs of Benadryl is OK Or can also take 2 Dramamine (get the older version that can cause drowsiness) Or Unisom (doxylamine)

## 2016-02-01 NOTE — Progress Notes (Signed)
Pre visit review using our clinic review tool, if applicable. No additional management support is needed unless otherwise documented below in the visit note. 

## 2016-02-15 ENCOUNTER — Other Ambulatory Visit: Payer: Self-pay | Admitting: Family Medicine

## 2016-02-22 NOTE — Pre-Procedure Instructions (Signed)
[name]  02/22/2016      [facility] - [place], [place] - [id number] [place] [address] Phone: [phone] Fax: [phone]    Your procedure is scheduled on Thurs, Aug 10 @ 8:00 AM  Report to [facility] Admitting at 6:00 AM  Call this number if you have problems the morning of surgery:  [phone]   Remember:  Do not eat food or drink liquids after midnight.  Take these medicines the morning of surgery with A SIP OF WATER Amlodipine(Norvasc) and Fluoxetine(Prozac)             Stop taking your Aspirin. No Goody's,BC's,Aleve,Advil,Motrin,Ibuprofen,Fish Oil,or any Herbal Medications.    Do not wear jewelry, make-up or nail polish.  Do not wear lotions, powders, or perfumes.  You may wear deoderant.  Do not shave 48 hours prior to surgery.  Men may shave face and neck.  Do not bring valuables to the hospital.  [facility] is not responsible for any belongings or valuables.  Contacts, dentures or bridgework may not be worn into surgery.  Leave your suitcase in the car.  After surgery it may be brought to your room.  For patients admitted to the hospital, discharge time will be determined by your treatment team.  Patients discharged the day of surgery will not be allowed to drive home.     [facility]  Before surgery, you can play an important role.  Because skin is not sterile, your skin needs to be as free of germs as possible.  You can reduce the number of germs on you skin by washing with CHG (chlorahexidine gluconate) soap before surgery.  CHG is an antiseptic cleaner which kills germs and bonds with the skin to continue killing germs even after washing.  Please DO NOT use if you have an allergy to CHG or antibacterial soaps.  If your skin becomes reddened/irritated stop using the CHG and inform your nurse when you arrive at Short Stay.  Do not shave (including  legs and underarms) for at least 48 hours prior to the first CHG shower.  You may shave your face.  Please follow these instructions carefully:   1.  Shower with CHG Soap the night before surgery and the                                morning of Surgery.  2.  If you choose to wash your hair, wash your hair first as usual with your       normal shampoo.  3.  After you shampoo, rinse your hair and body thoroughly to remove the                      Shampoo.  4.  Use CHG as you would any other liquid soap.  You can apply chg directly       to the skin and wash gently with scrungie or a clean washcloth.  5.  Apply the CHG Soap to your body ONLY FROM THE NECK DOWN.        Do not use on open wounds or open sores.  Avoid contact with your eyes,       ears, mouth and genitals (private parts).  Wash genitals (private parts)       with your normal soap.  6.  Wash thoroughly, paying special attention to the area where your surgery        will be performed.  7.  Thoroughly rinse your body with warm water from the neck down.  8.  DO NOT shower/wash with your normal soap after using and rinsing off       the CHG Soap.  9.  Pat yourself dry with a clean towel.            10.  Wear clean pajamas.            11.  Place clean sheets on your bed the night of your first shower and do not        sleep with pets.  Day of Surgery  Do not apply any lotions/deoderants the morning of surgery.  Please wear clean clothes to the hospital/surgery center.       Please read over the following fact sheets that you were given. Surgical Site Infection Prevention

## 2016-02-23 ENCOUNTER — Encounter (HOSPITAL_COMMUNITY): Payer: Self-pay

## 2016-02-23 ENCOUNTER — Encounter (HOSPITAL_COMMUNITY)
Admission: RE | Admit: 2016-02-23 | Discharge: 2016-02-23 | Disposition: A | Discharge status: Home / Self Care | Payer: 59 | Source: Ambulatory Visit | Attending: Orthopedic Surgery | Admitting: Orthopedic Surgery

## 2016-02-23 HISTORY — DX: Personal history of Methicillin resistant Staphylococcus aureus infection: Z86.14

## 2016-02-23 LAB — RAPID URINE DRUG SCREEN, HOSP PERFORMED
Amphetamines: NOT DETECTED
Barbiturates: NOT DETECTED
Benzodiazepines: NOT DETECTED
Cocaine: NOT DETECTED
Opiates: NOT DETECTED
Tetrahydrocannabinol: NOT DETECTED

## 2016-02-23 LAB — CBC
HCT: 43 % (ref 39.0–52.0)
Hemoglobin: 14.6 g/dL (ref 13.0–17.0)
MCH: 30.2 pg (ref 26.0–34.0)
MCHC: 34 g/dL (ref 30.0–36.0)
MCV: 88.8 fL (ref 78.0–100.0)
Platelets: 211 10*3/uL (ref 150–400)
RBC: 4.84 MIL/uL (ref 4.22–5.81)
RDW: 13.1 % (ref 11.5–15.5)
WBC: 7.9 10*3/uL (ref 4.0–10.5)

## 2016-02-23 LAB — BASIC METABOLIC PANEL
Anion gap: 7 (ref 5–15)
BUN: 20 mg/dL (ref 6–20)
CO2: 24 mmol/L (ref 22–32)
Calcium: 9.5 mg/dL (ref 8.9–10.3)
Chloride: 106 mmol/L (ref 101–111)
Creatinine, Ser: 1 mg/dL (ref 0.61–1.24)
GFR calc Af Amer: >60 mL/min (ref >60)
GFR calc non Af Amer: >60 mL/min (ref >60)
Glucose, Bld: 93 mg/dL (ref 65–99)
Potassium: 4.1 mmol/L (ref 3.5–5.1)
Sodium: 137 mmol/L (ref 135–145)

## 2016-02-23 LAB — SURGICAL PCR SCREEN
MRSA, PCR: NEGATIVE
Staphylococcus aureus: NEGATIVE

## 2016-02-23 MED ORDER — CHLORHEXIDINE GLUCONATE 4 % EX LIQD: 60.0000 mL | Freq: Once | CUTANEOUS | Status: DC

## 2016-02-23 NOTE — Progress Notes (Addendum)
Cardiologist is Dr.Aileen Ingal-last visit in epic from 12-28-15  Medical Md is Dr.Spencer Copland  Echo report in epic from 12-18-15  Stress test report in epic from 12-09-15  Heart cath denies  EKG in epic from 12-02-15  Sleep study report in epic from 2015  CXR denies in past yr

## 2016-02-24 MED ORDER — ACETAMINOPHEN 500 MG PO TABS
1000.0000 mg | ORAL_TABLET | ORAL | Status: AC
  Administered 2016-02-25: 1000 mg via ORAL
  Filled 2016-02-24: qty 2

## 2016-02-24 MED ORDER — CLINDAMYCIN PHOSPHATE 900 MG/50ML IV SOLN
900.0000 mg | INTRAVENOUS | Status: AC
  Administered 2016-02-25: 900 mg via INTRAVENOUS
  Filled 2016-02-24: qty 50

## 2016-02-24 NOTE — Anesthesia Preprocedure Evaluation (Addendum)
Anesthesia Evaluation  Patient identified by MRN, date of birth, ID band Patient awake    Reviewed: Allergy & Precautions, H&P , NPO status , Patient's Chart, lab work & pertinent test results, reviewed documented beta blocker date and time   Airway Mallampati: II  TM Distance: >3 FB Neck ROM: full    Dental no notable dental hx. (+) Teeth Intact, Missing, Dental Advisory Given,    Pulmonary sleep apnea and Continuous Positive Airway Pressure Ventilation , Current Smoker, former smoker,    Pulmonary exam normal breath sounds clear to auscultation       Cardiovascular hypertension, Pt. on medications + Peripheral Vascular Disease   Rhythm:regular Rate:Normal     Neuro/Psych PSYCHIATRIC DISORDERS Depression    GI/Hepatic GERD  ,(+)     substance abuse  alcohol use and cocaine use,   Endo/Other    Renal/GU      Musculoskeletal  (+) Arthritis ,   Abdominal   Peds  Hematology   Anesthesia Other Findings Chronic back pain RSD HTN EtOH   Reproductive/Obstetrics                            Anesthesia Physical  Anesthesia Plan  ASA: II  Anesthesia Plan: General and Regional   Post-op Pain Management: GA combined w/ Regional for post-op pain   Induction: Intravenous  Airway Management Planned: LMA  Additional Equipment:   Intra-op Plan:   Post-operative Plan: Extubation in OR  Informed Consent: I have reviewed the patients History and Physical, chart, labs and discussed the procedure including the risks, benefits and alternatives for the proposed anesthesia with the patient or authorized representative who has indicated his/her understanding and acceptance.   Dental Advisory Given  Plan Discussed with: CRNA, Anesthesiologist and Surgeon  Anesthesia Plan Comments:                                         Anesthesia Evaluation  Patient identified by MRN, date of birth, ID  band Patient awake    Reviewed: Allergy & Precautions, H&P , NPO status , Patient's Chart, lab work & pertinent test results  Airway Mallampati: II   Neck ROM: full    Dental  (+) Teeth Intact, Missing, Dental Advisory Given,    Pulmonary sleep apnea and Continuous Positive Airway Pressure Ventilation , Current Smoker, former smoker,           Cardiovascular hypertension, Pt. on medications + Peripheral Vascular Disease       Neuro/Psych PSYCHIATRIC DISORDERS Depression    GI/Hepatic GERD  ,(+)     substance abuse  alcohol use and cocaine use,   Endo/Other    Renal/GU      Musculoskeletal  (+) Arthritis ,   Abdominal   Peds  Hematology   Anesthesia Other Findings Chronic back pain  Last case canceled due to elevated BP in setting of cocaine abuse  Reproductive/Obstetrics                            Anesthesia Physical  Anesthesia Plan  ASA: II  Anesthesia Plan: General and Regional   Post-op Pain Management: GA combined w/ Regional for post-op pain   Induction: Intravenous  Airway Management Planned: LMA and Oral ETT  Additional Equipment:   Intra-op Plan:   Post-operative Plan:  Extubation in OR  Informed Consent: I have reviewed the patients History and Physical, chart, labs and discussed the procedure including the risks, benefits and alternatives for the proposed anesthesia with the patient or authorized representative who has indicated his/her understanding and acceptance.     Plan Discussed with: CRNA, Anesthesiologist and Surgeon  Anesthesia Plan Comments:         Anesthesia Quick Evaluation  Anesthesia Quick Evaluation

## 2016-02-25 ENCOUNTER — Inpatient Hospital Stay (HOSPITAL_COMMUNITY): Payer: 59 | Admitting: Certified Registered Nurse Anesthetist

## 2016-02-25 ENCOUNTER — Inpatient Hospital Stay (HOSPITAL_COMMUNITY)
Admission: RE | Admit: 2016-02-25 | Discharge: 2016-02-29 | DRG: 493 | Disposition: A | Discharge status: Home Health | Payer: 59 | Source: Ambulatory Visit | Attending: Orthopedic Surgery | Admitting: Orthopedic Surgery

## 2016-02-25 ENCOUNTER — Encounter (HOSPITAL_COMMUNITY): Payer: Self-pay | Admitting: *Deleted

## 2016-02-25 ENCOUNTER — Encounter (HOSPITAL_COMMUNITY)
Admission: RE | Disposition: A | Discharge status: Home Health | Payer: Self-pay | Source: Ambulatory Visit | Attending: Orthopedic Surgery

## 2016-02-25 ENCOUNTER — Observation Stay (HOSPITAL_COMMUNITY): Payer: 59

## 2016-02-25 ENCOUNTER — Inpatient Hospital Stay (HOSPITAL_COMMUNITY): Payer: 59

## 2016-02-25 DIAGNOSIS — I9581 Postprocedural hypotension: Secondary | ICD-10-CM | POA: Diagnosis not present

## 2016-02-25 DIAGNOSIS — Z419 Encounter for procedure for purposes other than remedying health state, unspecified: Secondary | ICD-10-CM

## 2016-02-25 DIAGNOSIS — R52 Pain, unspecified: Secondary | ICD-10-CM

## 2016-02-25 DIAGNOSIS — E869 Volume depletion, unspecified: Secondary | ICD-10-CM | POA: Diagnosis not present

## 2016-02-25 DIAGNOSIS — Z981 Arthrodesis status: Secondary | ICD-10-CM

## 2016-02-25 DIAGNOSIS — R079 Chest pain, unspecified: Secondary | ICD-10-CM

## 2016-02-25 DIAGNOSIS — I9589 Other hypotension: Secondary | ICD-10-CM | POA: Diagnosis not present

## 2016-02-25 DIAGNOSIS — R0902 Hypoxemia: Secondary | ICD-10-CM

## 2016-02-25 DIAGNOSIS — F331 Major depressive disorder, recurrent, moderate: Secondary | ICD-10-CM | POA: Diagnosis present

## 2016-02-25 DIAGNOSIS — K219 Gastro-esophageal reflux disease without esophagitis: Secondary | ICD-10-CM | POA: Diagnosis present

## 2016-02-25 DIAGNOSIS — F17210 Nicotine dependence, cigarettes, uncomplicated: Secondary | ICD-10-CM | POA: Diagnosis present

## 2016-02-25 DIAGNOSIS — G4733 Obstructive sleep apnea (adult) (pediatric): Secondary | ICD-10-CM | POA: Diagnosis present

## 2016-02-25 DIAGNOSIS — T464X5A Adverse effect of angiotensin-converting-enzyme inhibitors, initial encounter: Secondary | ICD-10-CM

## 2016-02-25 DIAGNOSIS — N179 Acute kidney failure, unspecified: Secondary | ICD-10-CM | POA: Diagnosis not present

## 2016-02-25 DIAGNOSIS — Z79899 Other long term (current) drug therapy: Secondary | ICD-10-CM

## 2016-02-25 DIAGNOSIS — R109 Unspecified abdominal pain: Secondary | ICD-10-CM | POA: Diagnosis not present

## 2016-02-25 DIAGNOSIS — D62 Acute posthemorrhagic anemia: Secondary | ICD-10-CM | POA: Diagnosis not present

## 2016-02-25 DIAGNOSIS — I10 Essential (primary) hypertension: Secondary | ICD-10-CM | POA: Diagnosis present

## 2016-02-25 DIAGNOSIS — R140 Abdominal distension (gaseous): Secondary | ICD-10-CM

## 2016-02-25 DIAGNOSIS — G8929 Other chronic pain: Secondary | ICD-10-CM | POA: Diagnosis present

## 2016-02-25 DIAGNOSIS — M6282 Rhabdomyolysis: Secondary | ICD-10-CM | POA: Diagnosis not present

## 2016-02-25 DIAGNOSIS — M19172 Post-traumatic osteoarthritis, left ankle and foot: Secondary | ICD-10-CM | POA: Diagnosis present

## 2016-02-25 DIAGNOSIS — I739 Peripheral vascular disease, unspecified: Secondary | ICD-10-CM | POA: Diagnosis present

## 2016-02-25 DIAGNOSIS — Z7982 Long term (current) use of aspirin: Secondary | ICD-10-CM

## 2016-02-25 DIAGNOSIS — I9788 Other intraoperative complications of the circulatory system, not elsewhere classified: Secondary | ICD-10-CM | POA: Diagnosis not present

## 2016-02-25 DIAGNOSIS — M12572 Traumatic arthropathy, left ankle and foot: Principal | ICD-10-CM | POA: Diagnosis present

## 2016-02-25 DIAGNOSIS — M545 Low back pain: Secondary | ICD-10-CM | POA: Diagnosis present

## 2016-02-25 HISTORY — DX: Low back pain: M54.5

## 2016-02-25 HISTORY — DX: Low back pain, unspecified: M54.50

## 2016-02-25 HISTORY — DX: Dependence on other enabling machines and devices: Z99.89

## 2016-02-25 HISTORY — PX: FOOT ARTHRODESIS: SHX1655

## 2016-02-25 HISTORY — DX: Other chronic pain: G89.29

## 2016-02-25 HISTORY — PX: OSTEOTOMY: SHX137

## 2016-02-25 HISTORY — PX: ANKLE FUSION: SHX881

## 2016-02-25 HISTORY — DX: Obstructive sleep apnea (adult) (pediatric): G47.33

## 2016-02-25 LAB — CBC
HCT: 38.8 % — ABNORMAL LOW (ref 39.0–52.0)
Hemoglobin: 12.7 g/dL — ABNORMAL LOW (ref 13.0–17.0)
MCH: 28.9 pg (ref 26.0–34.0)
MCHC: 32.7 g/dL (ref 30.0–36.0)
MCV: 88.2 fL (ref 78.0–100.0)
Platelets: 196 10*3/uL (ref 150–400)
RBC: 4.4 MIL/uL (ref 4.22–5.81)
RDW: 13.3 % (ref 11.5–15.5)
WBC: 12.5 10*3/uL — ABNORMAL HIGH (ref 4.0–10.5)

## 2016-02-25 LAB — CREATININE, SERUM
Creatinine, Ser: 1.57 mg/dL — ABNORMAL HIGH (ref 0.61–1.24)
GFR calc Af Amer: 58 mL/min — ABNORMAL LOW (ref >60)
GFR calc non Af Amer: 50 mL/min — ABNORMAL LOW (ref >60)

## 2016-02-25 SURGERY — FUSION, JOINT, FOOT
Anesthesia: Regional | Site: Ankle | Laterality: Left

## 2016-02-25 MED ORDER — POLYETHYLENE GLYCOL 3350 17 G PO PACK
17.0000 g | PACK | Freq: Every day | ORAL | Status: DC
Start: 2016-02-26 — End: 2016-02-29
  Administered 2016-02-27 – 2016-02-29 (×3): 17 g via ORAL
  Filled 2016-02-25 (×4): qty 1

## 2016-02-25 MED ORDER — FOLIC ACID 1 MG PO TABS
1.0000 mg | ORAL_TABLET | Freq: Every day | ORAL | Status: DC
  Administered 2016-02-26 – 2016-02-29 (×4): 1 mg via ORAL
  Filled 2016-02-25 (×4): qty 1

## 2016-02-25 MED ORDER — ACETAMINOPHEN 650 MG RE SUPP: 650.0000 mg | Freq: Four times a day (QID) | RECTAL | Status: DC | PRN

## 2016-02-25 MED ORDER — LORAZEPAM 1 MG PO TABS
1.0000 mg | ORAL_TABLET | Freq: Four times a day (QID) | ORAL | Status: AC | PRN
  Administered 2016-02-26 – 2016-02-27 (×2): 1 mg via ORAL
  Filled 2016-02-25 (×2): qty 1

## 2016-02-25 MED ORDER — FENTANYL CITRATE (PF) 250 MCG/5ML IJ SOLN
INTRAMUSCULAR | Status: AC
Start: 2016-02-25 — End: 2016-02-25
  Filled 2016-02-25: qty 5

## 2016-02-25 MED ORDER — BISACODYL 10 MG RE SUPP: 10.0000 mg | Freq: Every day | RECTAL | Status: DC | PRN

## 2016-02-25 MED ORDER — PHENYLEPHRINE 40 MCG/ML (10ML) SYRINGE FOR IV PUSH (FOR BLOOD PRESSURE SUPPORT)
PREFILLED_SYRINGE | INTRAVENOUS | Status: AC
  Filled 2016-02-25: qty 10

## 2016-02-25 MED ORDER — LIDOCAINE 2% (20 MG/ML) 5 ML SYRINGE
INTRAMUSCULAR | Status: AC
  Filled 2016-02-25: qty 5

## 2016-02-25 MED ORDER — CLINDAMYCIN PHOSPHATE 600 MG/50ML IV SOLN
600.0000 mg | Freq: Four times a day (QID) | INTRAVENOUS | Status: DC
Start: 2016-02-25 — End: 2016-02-25
  Administered 2016-02-25: 600 mg via INTRAVENOUS
  Filled 2016-02-25 (×3): qty 50

## 2016-02-25 MED ORDER — METOCLOPRAMIDE HCL 5 MG PO TABS
5.0000 mg | ORAL_TABLET | Freq: Three times a day (TID) | ORAL | Status: DC | PRN
  Filled 2016-02-25: qty 2

## 2016-02-25 MED ORDER — 0.9 % SODIUM CHLORIDE (POUR BTL) OPTIME
TOPICAL | Status: DC | PRN
  Administered 2016-02-25: 1000 mL

## 2016-02-25 MED ORDER — VITAMIN B-1 100 MG PO TABS
100.0000 mg | ORAL_TABLET | Freq: Every day | ORAL | Status: DC
  Administered 2016-02-26 – 2016-02-29 (×4): 100 mg via ORAL
  Filled 2016-02-25 (×4): qty 1

## 2016-02-25 MED ORDER — METHOCARBAMOL 1000 MG/10ML IJ SOLN
500.0000 mg | Freq: Four times a day (QID) | INTRAVENOUS | Status: DC
  Filled 2016-02-25 (×2): qty 5

## 2016-02-25 MED ORDER — OXYCODONE HCL 5 MG PO TABS
5.0000 mg | ORAL_TABLET | ORAL | Status: DC | PRN
  Administered 2016-02-25: 5 mg via ORAL
  Administered 2016-02-25 – 2016-02-29 (×9): 10 mg via ORAL
  Filled 2016-02-25 (×10): qty 2

## 2016-02-25 MED ORDER — METHOCARBAMOL 500 MG PO TABS
1000.0000 mg | ORAL_TABLET | Freq: Four times a day (QID) | ORAL | Status: DC
  Administered 2016-02-25: 1000 mg via ORAL
  Filled 2016-02-25: qty 2

## 2016-02-25 MED ORDER — SUGAMMADEX SODIUM 200 MG/2ML IV SOLN
INTRAVENOUS | Status: DC | PRN
  Administered 2016-02-25: 216 mg via INTRAVENOUS

## 2016-02-25 MED ORDER — METHOCARBAMOL 500 MG PO TABS
1000.0000 mg | ORAL_TABLET | Freq: Four times a day (QID) | ORAL | Status: DC
  Administered 2016-02-26 – 2016-02-29 (×13): 1000 mg via ORAL
  Filled 2016-02-25 (×14): qty 2

## 2016-02-25 MED ORDER — ONDANSETRON HCL 4 MG/2ML IJ SOLN
INTRAMUSCULAR | Status: DC | PRN
  Administered 2016-02-25: 4 mg via INTRAVENOUS

## 2016-02-25 MED ORDER — FENTANYL CITRATE (PF) 100 MCG/2ML IJ SOLN
25.0000 ug | INTRAMUSCULAR | Status: DC | PRN
  Administered 2016-02-25 (×2): 50 ug via INTRAVENOUS

## 2016-02-25 MED ORDER — DEXTROSE 5 % IV SOLN
INTRAVENOUS | Status: DC | PRN
  Administered 2016-02-25: 10:00:00 via INTRAVENOUS
  Administered 2016-02-25: 25 ug/min via INTRAVENOUS

## 2016-02-25 MED ORDER — VITAMIN C 500 MG/5ML PO SYRP: 500.0000 mg | ORAL_SOLUTION | Freq: Every day | ORAL | Status: DC

## 2016-02-25 MED ORDER — FLUOXETINE HCL 20 MG PO CAPS
40.0000 mg | ORAL_CAPSULE | Freq: Every day | ORAL | Status: DC
  Administered 2016-02-26 – 2016-02-29 (×4): 40 mg via ORAL
  Filled 2016-02-25 (×5): qty 2

## 2016-02-25 MED ORDER — ACETAMINOPHEN 325 MG PO TABS
650.0000 mg | ORAL_TABLET | Freq: Four times a day (QID) | ORAL | Status: DC | PRN
  Administered 2016-02-25: 650 mg via ORAL
  Filled 2016-02-25: qty 2

## 2016-02-25 MED ORDER — ONDANSETRON HCL 4 MG/2ML IJ SOLN
INTRAMUSCULAR | Status: AC
  Filled 2016-02-25: qty 2

## 2016-02-25 MED ORDER — MIDAZOLAM HCL 2 MG/2ML IJ SOLN
INTRAMUSCULAR | Status: AC
  Filled 2016-02-25: qty 2

## 2016-02-25 MED ORDER — ENOXAPARIN SODIUM 40 MG/0.4ML ~~LOC~~ SOLN
40.0000 mg | SUBCUTANEOUS | Status: DC
  Administered 2016-02-26: 40 mg via SUBCUTANEOUS
  Filled 2016-02-25 (×2): qty 0.4

## 2016-02-25 MED ORDER — ADULT MULTIVITAMIN W/MINERALS CH
1.0000 | ORAL_TABLET | Freq: Every day | ORAL | Status: DC
  Administered 2016-02-26 – 2016-02-29 (×4): 1 via ORAL
  Filled 2016-02-25 (×4): qty 1

## 2016-02-25 MED ORDER — HYDROCODONE-ACETAMINOPHEN 7.5-325 MG PO TABS
1.0000 | ORAL_TABLET | Freq: Four times a day (QID) | ORAL | Status: DC | PRN
  Administered 2016-02-25 – 2016-02-27 (×3): 2 via ORAL
  Filled 2016-02-25 (×3): qty 2

## 2016-02-25 MED ORDER — MIDAZOLAM HCL 5 MG/5ML IJ SOLN
INTRAMUSCULAR | Status: DC | PRN
  Administered 2016-02-25 (×2): 1 mg via INTRAVENOUS

## 2016-02-25 MED ORDER — EPHEDRINE 5 MG/ML INJ
INTRAVENOUS | Status: AC
  Filled 2016-02-25: qty 10

## 2016-02-25 MED ORDER — ROCURONIUM BROMIDE 10 MG/ML (PF) SYRINGE
PREFILLED_SYRINGE | INTRAVENOUS | Status: AC
  Filled 2016-02-25: qty 10

## 2016-02-25 MED ORDER — SUCCINYLCHOLINE CHLORIDE 200 MG/10ML IV SOSY
PREFILLED_SYRINGE | INTRAVENOUS | Status: AC
  Filled 2016-02-25: qty 10

## 2016-02-25 MED ORDER — PHENYLEPHRINE HCL 10 MG/ML IJ SOLN
INTRAMUSCULAR | Status: DC | PRN
  Administered 2016-02-25 (×5): 80 ug via INTRAVENOUS

## 2016-02-25 MED ORDER — HYDROMORPHONE HCL 1 MG/ML IJ SOLN
0.5000 mg | INTRAMUSCULAR | Status: DC | PRN
  Administered 2016-02-25 – 2016-02-26 (×7): 1 mg via INTRAVENOUS
  Administered 2016-02-26: 0.5 mg via INTRAVENOUS
  Administered 2016-02-27 – 2016-02-29 (×11): 1 mg via INTRAVENOUS
  Filled 2016-02-25 (×19): qty 1

## 2016-02-25 MED ORDER — FENTANYL CITRATE (PF) 100 MCG/2ML IJ SOLN
INTRAMUSCULAR | Status: DC | PRN
  Administered 2016-02-25: 100 ug via INTRAVENOUS
  Administered 2016-02-25 (×4): 50 ug via INTRAVENOUS

## 2016-02-25 MED ORDER — PROPOFOL 10 MG/ML IV BOLUS
INTRAVENOUS | Status: AC
  Filled 2016-02-25: qty 40

## 2016-02-25 MED ORDER — METHOCARBAMOL 1000 MG/10ML IJ SOLN
500.0000 mg | Freq: Four times a day (QID) | INTRAVENOUS | Status: DC
  Administered 2016-02-25 – 2016-02-28 (×2): 500 mg via INTRAVENOUS
  Filled 2016-02-25 (×17): qty 5

## 2016-02-25 MED ORDER — MAGNESIUM CITRATE PO SOLN: 1.0000 | Freq: Once | ORAL | Status: DC | PRN

## 2016-02-25 MED ORDER — PROPOFOL 10 MG/ML IV BOLUS
INTRAVENOUS | Status: DC | PRN
  Administered 2016-02-25: 200 mg via INTRAVENOUS

## 2016-02-25 MED ORDER — SUGAMMADEX SODIUM 500 MG/5ML IV SOLN
INTRAVENOUS | Status: AC
  Filled 2016-02-25: qty 5

## 2016-02-25 MED ORDER — CLINDAMYCIN PHOSPHATE 600 MG/50ML IV SOLN
600.0000 mg | Freq: Four times a day (QID) | INTRAVENOUS | Status: AC
  Administered 2016-02-25 – 2016-02-26 (×2): 600 mg via INTRAVENOUS
  Filled 2016-02-25 (×2): qty 50

## 2016-02-25 MED ORDER — LACTATED RINGERS IV SOLN
INTRAVENOUS | Status: DC | PRN
  Administered 2016-02-25 (×2): via INTRAVENOUS

## 2016-02-25 MED ORDER — ROCURONIUM BROMIDE 100 MG/10ML IV SOLN
INTRAVENOUS | Status: DC | PRN
  Administered 2016-02-25: 80 mg via INTRAVENOUS
  Administered 2016-02-25 (×2): 10 mg via INTRAVENOUS

## 2016-02-25 MED ORDER — METOCLOPRAMIDE HCL 5 MG/ML IJ SOLN
5.0000 mg | Freq: Three times a day (TID) | INTRAMUSCULAR | Status: DC | PRN
  Administered 2016-02-26: 10 mg via INTRAVENOUS
  Filled 2016-02-25: qty 2

## 2016-02-25 MED ORDER — LIDOCAINE HCL (CARDIAC) 20 MG/ML IV SOLN
INTRAVENOUS | Status: DC | PRN
  Administered 2016-02-25: 100 mg via INTRAVENOUS

## 2016-02-25 MED ORDER — ONDANSETRON HCL 4 MG/2ML IJ SOLN
4.0000 mg | Freq: Four times a day (QID) | INTRAMUSCULAR | Status: DC | PRN
  Administered 2016-02-25: 4 mg via INTRAVENOUS
  Filled 2016-02-25: qty 2

## 2016-02-25 MED ORDER — DOCUSATE SODIUM 100 MG PO CAPS
100.0000 mg | ORAL_CAPSULE | Freq: Two times a day (BID) | ORAL | Status: DC
  Administered 2016-02-25 – 2016-02-29 (×8): 100 mg via ORAL
  Filled 2016-02-25 (×8): qty 1

## 2016-02-25 MED ORDER — POTASSIUM CHLORIDE IN NACL 20-0.9 MEQ/L-% IV SOLN
INTRAVENOUS | Status: DC
  Administered 2016-02-26: 1000 mL via INTRAVENOUS
  Filled 2016-02-25: qty 1000

## 2016-02-25 MED ORDER — ONDANSETRON HCL 4 MG PO TABS: 4.0000 mg | ORAL_TABLET | Freq: Four times a day (QID) | ORAL | Status: DC | PRN

## 2016-02-25 MED ORDER — EPHEDRINE SULFATE 50 MG/ML IJ SOLN
INTRAMUSCULAR | Status: DC | PRN
  Administered 2016-02-25 (×4): 5 mg via INTRAVENOUS
  Administered 2016-02-25: 10 mg via INTRAVENOUS
  Administered 2016-02-25 (×2): 5 mg via INTRAVENOUS

## 2016-02-25 MED ORDER — OXYCODONE HCL 5 MG PO TABS
ORAL_TABLET | ORAL | Status: AC
  Filled 2016-02-25: qty 1

## 2016-02-25 MED ORDER — FENTANYL CITRATE (PF) 250 MCG/5ML IJ SOLN
INTRAMUSCULAR | Status: AC
  Filled 2016-02-25: qty 5

## 2016-02-25 MED ORDER — LORAZEPAM 2 MG/ML IJ SOLN
1.0000 mg | Freq: Four times a day (QID) | INTRAMUSCULAR | Status: AC | PRN
  Administered 2016-02-26: 1 mg via INTRAVENOUS
  Filled 2016-02-25: qty 1

## 2016-02-25 MED ORDER — THIAMINE HCL 100 MG/ML IJ SOLN: 100.0000 mg | Freq: Every day | INTRAMUSCULAR | Status: DC

## 2016-02-25 MED ORDER — FENTANYL CITRATE (PF) 100 MCG/2ML IJ SOLN
INTRAMUSCULAR | Status: AC
  Filled 2016-02-25: qty 2

## 2016-02-25 SURGICAL SUPPLY — 78 items
BANDAGE ACE 4X5 VEL STRL LF (GAUZE/BANDAGES/DRESSINGS) ×1 IMPLANT
BANDAGE ACE 6X5 VEL STRL LF (GAUZE/BANDAGES/DRESSINGS) ×1 IMPLANT
BANDAGE ELASTIC 4 VELCRO ST LF (GAUZE/BANDAGES/DRESSINGS) ×2 IMPLANT
BANDAGE ELASTIC 6 VELCRO ST LF (GAUZE/BANDAGES/DRESSINGS) ×2 IMPLANT
BANDAGE ESMARK 6X9 LF (GAUZE/BANDAGES/DRESSINGS) ×1 IMPLANT
BIT DRILL CALIBRATED 4.3X320MM (BIT) IMPLANT
BIT DRILL CANN 7X200 (BIT) ×1 IMPLANT
BLADE SURG 10 STRL SS (BLADE) ×2 IMPLANT
BLADE SURG 15 STRL LF DISP TIS (BLADE) ×2 IMPLANT
BLADE SURG 15 STRL SS (BLADE) ×4
BNDG CMPR 9X6 STRL LF SNTH (GAUZE/BANDAGES/DRESSINGS) ×1
BNDG COHESIVE 4X5 TAN STRL (GAUZE/BANDAGES/DRESSINGS) IMPLANT
BNDG ESMARK 6X9 LF (GAUZE/BANDAGES/DRESSINGS) ×2
BNDG GAUZE ELAST 4 BULKY (GAUZE/BANDAGES/DRESSINGS) ×3 IMPLANT
BRUSH SCRUB DISP (MISCELLANEOUS) ×4 IMPLANT
COVER MAYO STAND STRL (DRAPES) ×2 IMPLANT
COVER SURGICAL LIGHT HANDLE (MISCELLANEOUS) ×2 IMPLANT
CUFF TOURNIQUET SINGLE 34IN LL (TOURNIQUET CUFF) ×2 IMPLANT
DRAPE C-ARM 42X72 X-RAY (DRAPES) ×2 IMPLANT
DRAPE C-ARMOR (DRAPES) ×2 IMPLANT
DRAPE ORTHO SPLIT 77X108 STRL (DRAPES) ×2
DRAPE SURG ORHT 6 SPLT 77X108 (DRAPES) ×1 IMPLANT
DRAPE U-SHAPE 47X51 STRL (DRAPES) ×2 IMPLANT
DRILL CALIBRATED 4.3X320MM (BIT) ×2
DRSG ADAPTIC 3X8 NADH LF (GAUZE/BANDAGES/DRESSINGS) ×1 IMPLANT
DRSG EMULSION OIL 3X3 NADH (GAUZE/BANDAGES/DRESSINGS) ×2 IMPLANT
DRSG PAD ABDOMINAL 8X10 ST (GAUZE/BANDAGES/DRESSINGS) ×1 IMPLANT
ELECT REM PT RETURN 9FT ADLT (ELECTROSURGICAL) ×2
ELECTRODE REM PT RTRN 9FT ADLT (ELECTROSURGICAL) ×1 IMPLANT
GAUZE SPONGE 4X4 12PLY STRL (GAUZE/BANDAGES/DRESSINGS) ×2 IMPLANT
GLOVE BIO SURGEON STRL SZ7.5 (GLOVE) ×2 IMPLANT
GLOVE BIO SURGEON STRL SZ8 (GLOVE) ×2 IMPLANT
GLOVE BIOGEL PI IND STRL 7.5 (GLOVE) ×1 IMPLANT
GLOVE BIOGEL PI INDICATOR 7.5 (GLOVE) ×1
GOWN STRL REUS W/ TWL LRG LVL3 (GOWN DISPOSABLE) ×2 IMPLANT
GOWN STRL REUS W/ TWL XL LVL3 (GOWN DISPOSABLE) ×1 IMPLANT
GOWN STRL REUS W/TWL LRG LVL3 (GOWN DISPOSABLE) ×4
GOWN STRL REUS W/TWL XL LVL3 (GOWN DISPOSABLE) ×2
GUIDEWIRE 2.6X80 BEAD TIP (WIRE) IMPLANT
GUIDWIRE 2.6X80 BEAD TIP (WIRE) ×2
KIT BASIN OR (CUSTOM PROCEDURE TRAY) ×2 IMPLANT
KIT ROOM TURNOVER OR (KITS) ×2 IMPLANT
MANIFOLD NEPTUNE II (INSTRUMENTS) ×2 IMPLANT
NAIL ANKLE LOCK ANTE 10X180 (Nail) ×1 IMPLANT
NS IRRIG 1000ML POUR BTL (IV SOLUTION) ×2 IMPLANT
PACK ORTHO EXTREMITY (CUSTOM PROCEDURE TRAY) ×2 IMPLANT
PAD ABD 8X10 STRL (GAUZE/BANDAGES/DRESSINGS) ×3 IMPLANT
PAD ARMBOARD 7.5X6 YLW CONV (MISCELLANEOUS) ×4 IMPLANT
PAD CAST 4YDX4 CTTN HI CHSV (CAST SUPPLIES) ×2 IMPLANT
PADDING CAST COTTON 4X4 STRL (CAST SUPPLIES) ×2
PADDING CAST COTTON 6X4 STRL (CAST SUPPLIES) ×2 IMPLANT
PENCIL BUTTON HOLSTER BLD 10FT (ELECTRODE) ×2 IMPLANT
PIN GUIDE 3.2 903003004 (MISCELLANEOUS) ×2 IMPLANT
SCREW CORT TI DBL LEAD 5X24 (Screw) ×2 IMPLANT
SCREW CORT TI DBL LEAD 5X26 (Screw) ×1 IMPLANT
SCREW CORT TI DBL LEAD 5X38 (Screw) ×1 IMPLANT
SCREW CORT TI DBL LEAD 5X65 (Screw) ×1 IMPLANT
SPLINT PLASTER CAST XFAST 5X30 (CAST SUPPLIES) IMPLANT
SPLINT PLASTER XFAST SET 5X30 (CAST SUPPLIES) ×1
SPONGE GAUZE 4X4 12PLY STER LF (GAUZE/BANDAGES/DRESSINGS) ×1 IMPLANT
SPONGE LAP 18X18 X RAY DECT (DISPOSABLE) ×3 IMPLANT
SPONGE SCRUB IODOPHOR (GAUZE/BANDAGES/DRESSINGS) ×4 IMPLANT
SUCTION FRAZIER HANDLE 10FR (MISCELLANEOUS) ×1
SUCTION TUBE FRAZIER 10FR DISP (MISCELLANEOUS) ×1 IMPLANT
SUT ETHILON 2 0 FS 18 (SUTURE) ×1 IMPLANT
SUT ETHILON 3 0 PS 1 (SUTURE) ×5 IMPLANT
SUT VIC AB 2-0 CT1 27 (SUTURE) ×6
SUT VIC AB 2-0 CT1 TAPERPNT 27 (SUTURE) IMPLANT
SUT VIC AB 2-0 CT3 27 (SUTURE) IMPLANT
SUT VIC AB 2-0 SH 18 (SUTURE) ×2 IMPLANT
SUT VIC AB 3-0 FS2 27 (SUTURE) ×2 IMPLANT
SYSTEM CHEST DRAIN TLS 7FR (DRAIN) IMPLANT
TOWEL OR 17X24 6PK STRL BLUE (TOWEL DISPOSABLE) ×3 IMPLANT
TOWEL OR 17X26 10 PK STRL BLUE (TOWEL DISPOSABLE) ×4 IMPLANT
TUBE CONNECTING 12X1/4 (SUCTIONS) ×2 IMPLANT
UNDERPAD 30X30 INCONTINENT (UNDERPADS AND DIAPERS) ×2 IMPLANT
WATER STERILE IRR 1000ML POUR (IV SOLUTION) ×2 IMPLANT
YANKAUER SUCT BULB TIP NO VENT (SUCTIONS) ×1 IMPLANT

## 2016-02-25 NOTE — Progress Notes (Signed)
Pt ankle incision dsg bleeding with bright red blood. abd pad and kerlix wrap used to reinforced dsg. Will closely monitor. Delia Heady RN

## 2016-02-25 NOTE — Transfer of Care (Signed)
Immediate Anesthesia Transfer of Care Note  Patient: [name]  Procedure(s) Performed: Procedure(s) with comments: TIBIO TALAR FUSION (Left) - latex precautions protocol throughout  Patient Location: PACU  Anesthesia Type:General  Level of Consciousness: awake and responds to stimulation  Airway & Oxygen Therapy: Patient Spontanous Breathing and Patient connected to face mask oxygen  Post-op Assessment: Report given to RN, Post -op Vital signs reviewed and stable and Patient moving all extremities X 4  Post vital signs: Reviewed and stable  Last Vitals:  Vitals:   02/25/16 0640 02/25/16 1219  BP: (!) 140/108   Pulse: 72   Resp: 20   Temp: 36.4 C (P) 36.1 C    Last Pain:  Vitals:   02/25/16 1219  TempSrc:   PainSc: (P) Asleep      Patients Stated Pain Goal: 3 ([id number])  Complications: No apparent anesthesia complications

## 2016-02-25 NOTE — Progress Notes (Signed)
Pt arrived to the unit from pacu with IV intact and transfusing. Pt A&O x4; LLE incision dsg remains clean, dry and intact with no stain or active bleeding noted. Pt oriented to the unit and room; fall/safety precaution/prevention education complete with pt and family at bedside. Pt LLE elevated on pillows; pt report pain, some numbness and tingling to left incision dsg. Prn pain medication administered to pt. Will closely monitor. Delia Heady RN

## 2016-02-25 NOTE — Anesthesia Procedure Notes (Addendum)
Anesthesia Regional Block:  Popliteal block  Pre-Anesthetic Checklist: ,, timeout performed, Correct Patient, Correct Site, Correct Laterality, Correct Position,,,,,,, at surgeon's request and post-op pain management  Laterality: Left  Prep: chloraprep       Needles:   Needle Type: Echogenic Needle     Needle Length: 9cm 9 cm Needle Gauge: 21 and 21 G    Additional Needles:  Procedures: ultrasound guided (picture in chart) Popliteal block Narrative:  Anesthesiologist: Lyndle Herrlich  Additional Notes: .5% Naropin 20cc

## 2016-02-25 NOTE — Progress Notes (Signed)
PT Cancellation Note  Patient Details Name: [name] MRN: [id number] DOB: 07/15/1966   Cancelled Treatment:    Reason Eval/Treat Not Completed: Fatigue/lethargy limiting ability to participate . Attempted evaluation; however, pt still with effects of anesthesia, unable to stay awake and focus, therefore unable to safely gait train at this time. PT will continue to f/u with pt as appropriate.  Clearnce Sorrel Ary Lavine 02/25/2016, 4:31 PM Sherie Don, [place], DPT [phone]

## 2016-02-25 NOTE — Progress Notes (Signed)
Pt incision dsg continue to be saturated with bright red blood through the reinforced dsg; MD on call notified and he ordered to remove the saturated reinforced dsg and apply new dsg pressure/reinforced dsg until he comes up to assess pt dsg. New abd pad with compression wrap pressure dsg applied to reinforce as ordered. Will continue to monitor pt closely. Delia Heady RN

## 2016-02-25 NOTE — Progress Notes (Signed)
PA Martensen at bedside gave verbal order for CIWAL protocol on pt. Will continue to closely monitor pt. Delia Heady RN

## 2016-02-25 NOTE — Op Note (Signed)
[name], [name]                  ACCOUNT NO.:  [id number]  MEDICAL RECORD NO.:  [id number]  LOCATION:  [facility]                         FACILITY:  [place] [facility]  PHYSICIAN:  Astrid Divine. Marcelino Scot, M.D. DATE OF BIRTH:  1965/08/24  DATE OF PROCEDURE:  02/25/2016 DATE OF DISCHARGE:                              OPERATIVE REPORT   PREOPERATIVE DIAGNOSIS:  Left ankle posttraumatic arthritis.  POSTOPERATIVE DIAGNOSIS:  Left ankle posttraumatic arthritis.  PROCEDURE: 1. Tibiotalar fusion, left ankle joint using Biomet fusion nail 10 x     180 compressed and statically locked. 2. Fibular osteotomy.  SURGEON:  Astrid Divine. Marcelino Scot, MD  ASSISTANT:  Justin Spinner, PA-C  ANESTHESIA:  General supplemented with regional block.  EBL:  125 mL.  IV FLUIDS:  2000 mL crystalloid.  DRAINS:  None.  SPECIMENS:  None.  TOURNIQUET:  None.  DISPOSITION:  To PACU.  CONDITION:  Stable.  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  He has undergone extensive preoperative workup including consultation by a foot and ankle surgeon, serial injections, both diagnostic and therapeutic and now presents for definitive fusion.  We did discuss amputation as a possible alternative as well as ankle replacement. Given the patient's very high demand and labor occupations, he elected for fusion.  He understood the risks to include, heart attack, stroke, nerve injury, vessel injury, DVT, PE, malunion, symptomatic hardware, failure of the arthrodesis and multiple others and did wish to proceed.  BRIEF SUMMARY OF PROCEDURE:  The patient was taken to the operating room, where anesthesia was induced.  Latex precautions were used.  The patient received clindamycin as antibiotic, chlorhexidine, and ChloraPrep.  I began with a midline incision after a time-out with hopes of doing  a tibia and talar cut only.  Extensive scarring precluded this as I was unable to close the surfaces.  I made small stab incisions initially both medially and laterally through which I introduced Cobbs and then at the soft tissue protectors right along the posterior aspect of the tibia and placed K-wires from anterior to posterior to gauge the correct trajectory, but again was unable to oppose these surfaces. Consequently, had to extend both the medial and lateral incisions to perform a complete removal of the tibia medial malleolus as well as the fibula.  The osteotomy to malleoli would now normally be performed, but because of again the previous surgeries and injuries, was required and consequently performed.  It was done under direct visualization with cautery and 15 blade being used to remove the soft tissue attachments, these were then used for bone harvest as well with autograft from the cancellous regions.  The wounds were irrigated thoroughly removing all cortical bone and chondral fragments.  The talus was cut to match and fine tuned with a rasp.  K-wire was placed across with correct alignment on orthogonal views, sequentially reamed up to 11 placing the 10 x 180 mm nail locking into the talus, then proximally 2 statics into the tibial shaft.  This was followed by a compression of the tibiotalar joint  and then compression of the subtalar joint with the pad on the calcaneus.  This was followed by placement of the posterior-anterior screw and we used a 65 mm screw there.  Final images showed all screws to be within the nail and appropriate length and trajectory.  Bone graft was placed anteriorly into the cleft and we had outstanding bone apposition throughout fusion site.  Wounds were irrigated thoroughly, closed in standard layered fashion using 0 Vicryl, 2-0 Vicryl, and 3-0 nylon.  Sterile gently compressive dressing was applied and posterior stirrup and splint.  The patient was  taken to PACU in stable condition.  PROGNOSIS:  The patient will do well if he is able to maintain compliance with weightbearing restrictions and avoid substance abuse which has been a problem from time to time in the past.  The patient has an outstanding work ethic and altruistic nature in addition to a strong relationship with his mother and hopeful this will mitigate some of these social factors.  He will be nonweightbearing for 8 weeks and then weightbearing in protected fashion for the ensuing 4 weeks.  Return to work when pain allows as he uses hand controls.  He will be on formal pharmacologic DVT prophylaxis.  We anticipate a 2 day stay.     Astrid Divine. Marcelino Scot, M.D.     MHH/MEDQ  D:  02/25/2016  T:  02/25/2016  Job:  [id number]
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
## 2016-02-25 NOTE — Progress Notes (Signed)
Called to bedside to assist with persistent dressing/ace wrap saturation.  Head of bed was elevated and limb up on pillows. Patient reports significant pain and nausea - nurse administered medicine for same.  HR and RR wnl on arrival.  Patient uncomfortable appearing, in NAD.  Removed ace wrap and abd down to web roll.  Approx. Palm sized area of blood saturated splint.  Re applied multiple abd, ace wrap and elevated limb with pillows.  Head of bed lowered.  Instructions for elevating and not ambulating discussed with the patient.  He verbalizes understanding.  The patient was resting supine comfortably when I left.

## 2016-02-25 NOTE — Brief Op Note (Signed)
02/25/2016  11:45 AM  PATIENT:  [name]  50 y.o. male  PRE-OPERATIVE DIAGNOSIS:  LEFT ANKLE ARTHRITIS  POST-OPERATIVE DIAGNOSIS:  LEFT ANKLE ARTHRITIS  PROCEDURE:  Procedure(s) with comments: TIBIO TALAR FUSION (Left) with Biomet fusion nail 10 x 180 compressed and statically locked  SURGEON:  Surgeon(s) and Role:    * Altamese Warner, MD - Primary  PHYSICIAN ASSISTANT: Ainsley Spinner, [facility]  ANESTHESIA:   regional and general  EBL:  Total I/O In: 1000 [I.V.:1000] Out: 125 [Blood:125]  BLOOD ADMINISTERED:none  DRAINS: none   LOCAL MEDICATIONS USED:  NONE  SPECIMEN:  No Specimen  DISPOSITION OF SPECIMEN:  N/A  COUNTS:  YES  TOURNIQUET:    DICTATION: .Other Dictation: Dictation Number [id number]  PLAN OF CARE: Admit to inpatient   PATIENT DISPOSITION:  PACU - hemodynamically stable.   Delay start of Pharmacological VTE agent (>24hrs) due to surgical blood loss or risk of bleeding: no

## 2016-02-25 NOTE — Anesthesia Procedure Notes (Signed)
Procedure Name: Intubation Date/Time: 02/25/2016 8:25 AM Performed by: Tressia Miners LEFFEW Pre-anesthesia Checklist: Patient identified, Patient being monitored, Timeout performed, Emergency Drugs available and Suction available Patient Re-evaluated:Patient Re-evaluated prior to inductionOxygen Delivery Method: Circle System Utilized Preoxygenation: Pre-oxygenation with 100% oxygen Intubation Type: IV induction Ventilation: Mask ventilation without difficulty and Oral airway inserted - appropriate to patient size Laryngoscope Size: Mac and 4 Grade View: Grade I Tube type: Oral Tube size: 7.5 mm Number of attempts: 1 Airway Equipment and Method: Stylet Placement Confirmation: ETT inserted through vocal cords under direct vision,  positive ETCO2 and breath sounds checked- equal and bilateral Secured at: 22 cm Tube secured with: Tape Dental Injury: Teeth and Oropharynx as per pre-operative assessment

## 2016-02-25 NOTE — Progress Notes (Signed)
Physical Therapy Treatment Patient Details Name: [name] MRN: [id number] DOB: 01-05-1966 Today's Date: 02/25/2016    History of Present Illness Pt is a 50 y/o male s/p tibiotalar fusion surgery secondary to prior history of a complex, post-traumatic L ankle fx. PMH including but not limited to ORIF for L calcaneus fx in 2014, hardware removal in 2014, HTN, ETOH and hx of cocaine abuse.    PT Comments    Pt presented supine in bed with HOB elevated, awake and willing to participate in therapy session. Pt able to perform all functional mobility with min guard for safety only. He stated that he has had a lot of practice as he has had to ambulate with NWB L LE restrictions in the past. Pt would continue to benefit from skilled physical therapy services at this time while admitted and after d/c to address his below listed limitations in order to improve his overall safety and independence with functional mobility.   Follow Up Recommendations  Home health PT;Supervision for mobility/OOB     Equipment Recommendations  None recommended by PT    Recommendations for Other Services       Precautions / Restrictions Precautions Precautions: Fall Restrictions Weight Bearing Restrictions: Yes LLE Weight Bearing: Non weight bearing    Mobility  Bed Mobility Overal bed mobility: Needs Assistance Bed Mobility: Supine to Sit;Sit to Supine     Supine to sit: Supervision Sit to supine: Min guard   General bed mobility comments: pt required increased time to complete task   Transfers Overall transfer level: Needs assistance Equipment used: Rolling walker (2 wheeled) Transfers: Sit to/from Stand Sit to Stand: Min guard         General transfer comment: pt required increased time to complete task  Ambulation/Gait Ambulation/Gait assistance: Min guard Ambulation Distance (Feet): 30 Feet Assistive device: Rolling walker (2 wheeled) Gait Pattern/deviations: Step-to pattern (hop-to  pattern to maintain NWB L LE status) Gait velocity: decreased Gait velocity interpretation: Below normal speed for age/gender General Gait Details: pt able to maintain NWB L LE status independently   Stairs            Wheelchair Mobility    Modified Rankin (Stroke Patients Only)       Balance Overall balance assessment: Needs assistance Sitting-balance support: Feet supported;No upper extremity supported Sitting balance-Leahy Scale: Fair     Standing balance support: During functional activity;Bilateral upper extremity supported Standing balance-Leahy Scale: Poor                      Cognition Arousal/Alertness: Awake/alert Behavior During Therapy: WFL for tasks assessed/performed Overall Cognitive Status: Within Functional Limits for tasks assessed                      Exercises      General Comments        Pertinent Vitals/Pain Pain Assessment: 0-10 Pain Score: 5  Pain Location: L ankle Pain Descriptors / Indicators: Discomfort Pain Intervention(s): Limited activity within patient's tolerance;Monitored during session;Repositioned    Home Living Family/patient expects to be discharged to:: Private residence Living Arrangements: Alone Available Help at Discharge: Family;Available PRN/intermittently Type of Home: House Home Access: Stairs to enter Entrance Stairs-Rails: Right;Left;Can reach both Home Layout: One level Home Equipment: Walker - 2 wheels;Crutches;Shower seat;Wheelchair - manual      Prior Function Level of Independence: Independent with assistive device(s)      Comments: pt using bilateral axillary crutches to ambulate  PT Goals (current goals can now be found in the care plan section) Acute Rehab PT Goals Patient Stated Goal: return home PT Goal Formulation: With patient Time For Goal Achievement: 03/03/16 Potential to Achieve Goals: Good    Frequency  7X/week    PT Plan      Co-evaluation              End of Session Equipment Utilized During Treatment: Gait belt Activity Tolerance: Patient limited by pain;Patient limited by fatigue Patient left: in bed;with call bell/phone within reach;Other (comment) (x-ray tech entered room to perform x-ray of L LE)     Time: [id number] PT Time Calculation (min) (ACUTE ONLY): 20 min  Charges:                       G Codes:  Functional Assessment Tool Used: clinical judgement Functional Limitation: Mobility: Walking and moving around Mobility: Walking and Moving Around Current Status [id number]): At least 1 percent but less than 20 percent impaired, limited or restricted Mobility: Walking and Moving Around Goal Status [phone]): 0 percent impaired, limited or restricted   [facility] 02/25/2016, 6:23 PM Sherie Don, [place], DPT [phone]

## 2016-02-26 ENCOUNTER — Observation Stay (HOSPITAL_COMMUNITY): Payer: 59

## 2016-02-26 ENCOUNTER — Encounter (HOSPITAL_COMMUNITY): Payer: Self-pay | Admitting: Orthopedic Surgery

## 2016-02-26 ENCOUNTER — Other Ambulatory Visit: Payer: Self-pay

## 2016-02-26 ENCOUNTER — Inpatient Hospital Stay (HOSPITAL_COMMUNITY): Payer: 59

## 2016-02-26 DIAGNOSIS — E869 Volume depletion, unspecified: Secondary | ICD-10-CM | POA: Diagnosis not present

## 2016-02-26 DIAGNOSIS — I9788 Other intraoperative complications of the circulatory system, not elsewhere classified: Secondary | ICD-10-CM | POA: Diagnosis not present

## 2016-02-26 DIAGNOSIS — F331 Major depressive disorder, recurrent, moderate: Secondary | ICD-10-CM | POA: Diagnosis not present

## 2016-02-26 DIAGNOSIS — I9589 Other hypotension: Secondary | ICD-10-CM | POA: Diagnosis not present

## 2016-02-26 DIAGNOSIS — I10 Essential (primary) hypertension: Secondary | ICD-10-CM | POA: Diagnosis not present

## 2016-02-26 DIAGNOSIS — N179 Acute kidney failure, unspecified: Secondary | ICD-10-CM | POA: Diagnosis not present

## 2016-02-26 DIAGNOSIS — G8929 Other chronic pain: Secondary | ICD-10-CM | POA: Diagnosis not present

## 2016-02-26 DIAGNOSIS — R0600 Dyspnea, unspecified: Secondary | ICD-10-CM | POA: Diagnosis not present

## 2016-02-26 DIAGNOSIS — T464X5A Adverse effect of angiotensin-converting-enzyme inhibitors, initial encounter: Secondary | ICD-10-CM | POA: Diagnosis not present

## 2016-02-26 DIAGNOSIS — R109 Unspecified abdominal pain: Secondary | ICD-10-CM | POA: Diagnosis present

## 2016-02-26 DIAGNOSIS — Z79899 Other long term (current) drug therapy: Secondary | ICD-10-CM | POA: Diagnosis not present

## 2016-02-26 DIAGNOSIS — D62 Acute posthemorrhagic anemia: Secondary | ICD-10-CM | POA: Diagnosis not present

## 2016-02-26 DIAGNOSIS — I739 Peripheral vascular disease, unspecified: Secondary | ICD-10-CM | POA: Diagnosis present

## 2016-02-26 DIAGNOSIS — F17210 Nicotine dependence, cigarettes, uncomplicated: Secondary | ICD-10-CM | POA: Diagnosis present

## 2016-02-26 DIAGNOSIS — Z7982 Long term (current) use of aspirin: Secondary | ICD-10-CM | POA: Diagnosis not present

## 2016-02-26 DIAGNOSIS — G4733 Obstructive sleep apnea (adult) (pediatric): Secondary | ICD-10-CM | POA: Diagnosis not present

## 2016-02-26 DIAGNOSIS — M12572 Traumatic arthropathy, left ankle and foot: Secondary | ICD-10-CM | POA: Diagnosis not present

## 2016-02-26 DIAGNOSIS — M6282 Rhabdomyolysis: Secondary | ICD-10-CM | POA: Diagnosis not present

## 2016-02-26 DIAGNOSIS — M545 Low back pain: Secondary | ICD-10-CM | POA: Diagnosis not present

## 2016-02-26 DIAGNOSIS — K219 Gastro-esophageal reflux disease without esophagitis: Secondary | ICD-10-CM | POA: Diagnosis not present

## 2016-02-26 DIAGNOSIS — I9581 Postprocedural hypotension: Secondary | ICD-10-CM | POA: Diagnosis not present

## 2016-02-26 DIAGNOSIS — R079 Chest pain, unspecified: Secondary | ICD-10-CM | POA: Diagnosis not present

## 2016-02-26 LAB — BLOOD GAS, ARTERIAL
Acid-base deficit: 4.9 mmol/L — ABNORMAL HIGH (ref 0.0–2.0)
Bicarbonate: 19.9 mEq/L — ABNORMAL LOW (ref 20.0–24.0)
Drawn by: 244851
O2 Content: 2 L/min
O2 Saturation: 95.8 %
Patient temperature: 98.6
TCO2: 21.1 mmol/L (ref 0–100)
pCO2 arterial: 38.9 mmHg (ref 35.0–45.0)
pH, Arterial: 7.33 — ABNORMAL LOW (ref 7.350–7.450)
pO2, Arterial: 81.8 mmHg (ref 80.0–100.0)

## 2016-02-26 LAB — RENAL FUNCTION PANEL
Albumin: 3.5 g/dL (ref 3.5–5.0)
Anion gap: 11 (ref 5–15)
BUN: 41 mg/dL — ABNORMAL HIGH (ref 6–20)
CO2: 22 mmol/L (ref 22–32)
Calcium: 8 mg/dL — ABNORMAL LOW (ref 8.9–10.3)
Chloride: 101 mmol/L (ref 101–111)
Creatinine, Ser: 4.19 mg/dL — ABNORMAL HIGH (ref 0.61–1.24)
GFR calc Af Amer: 18 mL/min — ABNORMAL LOW (ref >60)
GFR calc non Af Amer: 15 mL/min — ABNORMAL LOW (ref >60)
Glucose, Bld: 108 mg/dL — ABNORMAL HIGH (ref 65–99)
Phosphorus: 7.5 mg/dL — ABNORMAL HIGH (ref 2.5–4.6)
Potassium: 4.5 mmol/L (ref 3.5–5.1)
Sodium: 134 mmol/L — ABNORMAL LOW (ref 135–145)

## 2016-02-26 LAB — BASIC METABOLIC PANEL
Anion gap: 11 (ref 5–15)
Anion gap: 12 (ref 5–15)
BUN: 37 mg/dL — ABNORMAL HIGH (ref 6–20)
BUN: 41 mg/dL — ABNORMAL HIGH (ref 6–20)
CO2: 25 mmol/L (ref 22–32)
CO2: 21 mmol/L — ABNORMAL LOW (ref 22–32)
Calcium: 8.1 mg/dL — ABNORMAL LOW (ref 8.9–10.3)
Calcium: 8 mg/dL — ABNORMAL LOW (ref 8.9–10.3)
Chloride: 101 mmol/L (ref 101–111)
Chloride: 101 mmol/L (ref 101–111)
Creatinine, Ser: 3.37 mg/dL — ABNORMAL HIGH (ref 0.61–1.24)
Creatinine, Ser: 4.08 mg/dL — ABNORMAL HIGH (ref 0.61–1.24)
GFR calc Af Amer: 23 mL/min — ABNORMAL LOW (ref >60)
GFR calc Af Amer: 18 mL/min — ABNORMAL LOW (ref >60)
GFR calc non Af Amer: 20 mL/min — ABNORMAL LOW (ref >60)
GFR calc non Af Amer: 16 mL/min — ABNORMAL LOW (ref >60)
Glucose, Bld: 118 mg/dL — ABNORMAL HIGH (ref 65–99)
Glucose, Bld: 108 mg/dL — ABNORMAL HIGH (ref 65–99)
Potassium: 5.1 mmol/L (ref 3.5–5.1)
Potassium: 4.5 mmol/L (ref 3.5–5.1)
Sodium: 137 mmol/L (ref 135–145)
Sodium: 134 mmol/L — ABNORMAL LOW (ref 135–145)

## 2016-02-26 LAB — HEPATIC FUNCTION PANEL
ALT: 36 U/L (ref 17–63)
AST: 38 U/L (ref 15–41)
Albumin: 3.5 g/dL (ref 3.5–5.0)
Alkaline Phosphatase: 74 U/L (ref 38–126)
Bilirubin, Direct: 0.1 mg/dL (ref 0.1–0.5)
Indirect Bilirubin: 0.6 mg/dL (ref 0.3–0.9)
Total Bilirubin: 0.7 mg/dL (ref 0.3–1.2)
Total Protein: 5.7 g/dL — ABNORMAL LOW (ref 6.5–8.1)

## 2016-02-26 LAB — LACTIC ACID, PLASMA
Lactic Acid, Venous: 1.5 mmol/L (ref 0.5–1.9)
Lactic Acid, Venous: 1.4 mmol/L (ref 0.5–1.9)

## 2016-02-26 LAB — RAPID URINE DRUG SCREEN, HOSP PERFORMED
Amphetamines: NOT DETECTED
Barbiturates: NOT DETECTED
Benzodiazepines: POSITIVE — AB
Cocaine: NOT DETECTED
Opiates: POSITIVE — AB
Tetrahydrocannabinol: NOT DETECTED

## 2016-02-26 LAB — CBC
HCT: 34.5 % — ABNORMAL LOW (ref 39.0–52.0)
Hemoglobin: 11.1 g/dL — ABNORMAL LOW (ref 13.0–17.0)
MCH: 29.5 pg (ref 26.0–34.0)
MCHC: 32.2 g/dL (ref 30.0–36.0)
MCV: 91.8 fL (ref 78.0–100.0)
Platelets: 172 10*3/uL (ref 150–400)
RBC: 3.76 MIL/uL — ABNORMAL LOW (ref 4.22–5.81)
RDW: 14 % (ref 11.5–15.5)
WBC: 9.7 10*3/uL (ref 4.0–10.5)

## 2016-02-26 LAB — URINALYSIS, ROUTINE W REFLEX MICROSCOPIC
Bilirubin Urine: NEGATIVE
Glucose, UA: NEGATIVE mg/dL
Hgb urine dipstick: NEGATIVE
Ketones, ur: NEGATIVE mg/dL
Leukocytes, UA: NEGATIVE
Nitrite: NEGATIVE
Protein, ur: NEGATIVE mg/dL
Specific Gravity, Urine: 1.025 (ref 1.005–1.030)
pH: 5 (ref 5.0–8.0)

## 2016-02-26 LAB — SODIUM, URINE, RANDOM: Sodium, Ur: 99 mmol/L

## 2016-02-26 LAB — D-DIMER, QUANTITATIVE: D-Dimer, Quant: 0.95 ug/mL-FEU — ABNORMAL HIGH (ref 0.00–0.50)

## 2016-02-26 LAB — MAGNESIUM: Magnesium: 2 mg/dL (ref 1.7–2.4)

## 2016-02-26 LAB — TROPONIN I
Troponin I: <0.03 ng/mL (ref <0.03)
Troponin I: 0.03 ng/mL (ref <0.03)

## 2016-02-26 LAB — HEPARIN LEVEL (UNFRACTIONATED): Heparin Unfractionated: 1.05 IU/mL — ABNORMAL HIGH (ref 0.30–0.70)

## 2016-02-26 LAB — CK TOTAL AND CKMB (NOT AT ARMC)
CK, MB: 18 ng/mL — ABNORMAL HIGH (ref 0.5–5.0)
Relative Index: 0.6 (ref 0.0–2.5)
Total CK: 2829 U/L — ABNORMAL HIGH (ref 49–397)

## 2016-02-26 LAB — AMYLASE: Amylase: 58 U/L (ref 28–100)

## 2016-02-26 LAB — LIPASE, BLOOD: Lipase: 20 U/L (ref 11–51)

## 2016-02-26 LAB — CK: Total CK: 2672 U/L — ABNORMAL HIGH (ref 49–397)

## 2016-02-26 LAB — OSMOLALITY, URINE: Osmolality, Ur: 552 mOsm/kg (ref 300–900)

## 2016-02-26 LAB — CREATININE, URINE, RANDOM: Creatinine, Urine: 122.93 mg/dL

## 2016-02-26 LAB — PHOSPHORUS: Phosphorus: 7.5 mg/dL — ABNORMAL HIGH (ref 2.5–4.6)

## 2016-02-26 MED ORDER — NITROGLYCERIN 0.4 MG SL SUBL
SUBLINGUAL_TABLET | SUBLINGUAL | Status: AC
  Filled 2016-02-26: qty 1

## 2016-02-26 MED ORDER — HEPARIN BOLUS VIA INFUSION
5000.0000 [IU] | Freq: Once | INTRAVENOUS | Status: AC
  Administered 2016-02-26: 5000 [IU] via INTRAVENOUS
  Filled 2016-02-26: qty 5000

## 2016-02-26 MED ORDER — SODIUM CHLORIDE 0.9 % IV BOLUS (SEPSIS)
500.0000 mL | Freq: Once | INTRAVENOUS | Status: AC
  Administered 2016-02-26: 500 mL via INTRAVENOUS

## 2016-02-26 MED ORDER — HEPARIN (PORCINE) IN NACL 100-0.45 UNIT/ML-% IJ SOLN
1500.0000 [IU]/h | INTRAMUSCULAR | Status: DC
  Administered 2016-02-26: 1700 [IU]/h via INTRAVENOUS
  Administered 2016-02-27: 1200 [IU]/h via INTRAVENOUS
  Administered 2016-02-27: 1450 [IU]/h via INTRAVENOUS
  Filled 2016-02-26 (×3): qty 250

## 2016-02-26 MED ORDER — SODIUM CHLORIDE 0.9 % IV BOLUS (SEPSIS)
250.0000 mL | Freq: Once | INTRAVENOUS | Status: AC
  Administered 2016-02-26: 250 mL via INTRAVENOUS

## 2016-02-26 MED ORDER — NITROGLYCERIN 0.4 MG SL SUBL
0.4000 mg | SUBLINGUAL_TABLET | SUBLINGUAL | Status: DC | PRN
  Administered 2016-02-26: 0.4 mg via SUBLINGUAL

## 2016-02-26 MED ORDER — SODIUM CHLORIDE 0.9 % IV BOLUS (SEPSIS)
1000.0000 mL | Freq: Once | INTRAVENOUS | Status: AC
  Administered 2016-02-26: 1000 mL via INTRAVENOUS

## 2016-02-26 MED ORDER — NITROGLYCERIN 0.4 MG SL SUBL
SUBLINGUAL_TABLET | SUBLINGUAL | Status: AC
  Administered 2016-02-26: 0.4 mg
  Filled 2016-02-26: qty 1

## 2016-02-26 MED ORDER — SODIUM CHLORIDE 0.9 % IV SOLN
INTRAVENOUS | Status: DC
  Administered 2016-02-26 – 2016-02-27 (×4): via INTRAVENOUS

## 2016-02-26 NOTE — Progress Notes (Signed)
OT Cancellation Note  Patient Details Name: [name] MRN: [id number] DOB: 12-20-1965   Cancelled Treatment:    Reason Eval/Treat Not Completed: Pain limiting ability to participate;Patient at procedure or test/ unavailable. Attempted x 2 to see pt today. In A.m. Pt reports 20/10 pain and MD coming in to see pt as well. On second attempt, pt off unit for xray. Will follow up as appropriate later today as able, if not will follow up tomorrow  Britt Bottom 02/26/2016, 12:05 PM

## 2016-02-26 NOTE — Progress Notes (Signed)
Orthopaedic Trauma Service Progress Note  Subjective  Events of last night noted. I spoke to [place] myself last night and instructed reinforcement of the splint. Did not have him take the splint all the way down. Not surprised by the amount of saturation of the dressing.  Patient states that his block wore off early this morning. Pain in his left leg is doing okay  Patient has not eaten breakfast. States that he is not hungry. States that he is not passing gas Last bowel movement was morning of surgery  Patient denies any chest pain or shortness of breath. He did wear his CPAP last night. He has been CPAP for the last 2 years.  Patient does note some abdominal pain this morning. States he was not having this prior to admission. Patient was supine the entire case. No positioning devices were pushing on his abdomen at any point during the case.  pts anti-hypertensives have been on hold since surgery. BP's are good but significant bump in Bun and Cr  History of substance abuse but has not alcohol in several years. Last did cocaine at the end of may this year   Review of Systems  Constitutional: Negative for chills and fever.  Eyes: Negative for blurred vision and double vision.  Respiratory: Negative for shortness of breath.   Cardiovascular: Negative for chest pain and palpitations.  Gastrointestinal: Positive for abdominal pain. Negative for constipation, diarrhea, nausea and vomiting.  Genitourinary: Negative for dysuria and urgency.  Musculoskeletal: Positive for joint pain (left ankle pain ).  Neurological: Negative for tingling, sensory change and headaches.     Objective   BP 97/67 (BP Location: Right Arm)   Pulse 86   Temp 98 F (36.7 C) (Oral)   Resp 20   Wt 108 kg (238 lb)   SpO2 94%   BMI 32.28 kg/m   Intake/Output      08/10 0701 - 08/11 0700 08/11 0701 - 08/12 0700   P.O. 520    I.V. (mL/kg) 2300 (21.3)    IV Piggyback 205    Total Intake(mL/kg)  3025 (28)    Urine (mL/kg/hr) 200 (0.1)    Blood 125 (0)    Total Output 325     Net +2700            Labs  BMP Latest Ref Rng & Units 02/26/2016 02/25/2016 02/23/2016  Glucose 65 - 99 mg/dL 118(H) - 93  BUN 6 - 20 mg/dL 37(H) - 20  Creatinine 0.61 - 1.24 mg/dL 3.37(H) 1.57(H) 1.00  Sodium 135 - 145 mmol/L 137 - 137  Potassium 3.5 - 5.1 mmol/L 5.1 - 4.1  Chloride 101 - 111 mmol/L 101 - 106  CO2 22 - 32 mmol/L 25 - 24  Calcium 8.9 - 10.3 mg/dL 8.1(L) - 9.5     Exam  Gen: lying in bed, appears uncomfortable Lungs: diminished at bases, no wheezes Cardiac: RRR, s1 and s2 Abd: BS are hyperactive, abd is tense. Diffuse discomfort with palpation all quadrants. Tenderness with deep palpation of R and L upper quadrants  Ext:       Left Lower Extremity   Dressing looks good this am   DPN, SPN, TN sensation grossly intact  Ext warm  Brisk cap refill  + DP pulse  EHL, FHL, lesser toe motor functions intact    Assessment and Plan   POD/HD#: 1  50 y/o male s/p L tibiotalar fusion due to post-traumatic L ankle arthritis    -L  ankle arthritis s/p tibiotalar fusion   NWB L Lex x 8 weeks  Continue with current dressing and reinforce as needed   Not surprised with the amount of bleeding given the procedure performed  Ok to work with PT/OT  Ice and elevate leg above heart when not working with PT/OT  Will remove splint in 2 weeks and convert to [facility]   - abdomial pain, acute kidney injury   Significant increase in BUN/Cr   Lisinopril was not restarted post op, nor were his other BP meds   Blood loss was not excessive   Pt has received clindamycin periop.  No contrast has been given     Urinalysis and urine labs ordered, Renal function panel   Increase IVF rate to 125 cc/hr   Abdominal pain   Abdominal series ordered   Repeat BMET, LFTs, amylase and lipase      - Pain management:  PO meds  IV for breakthrough pain if needed  Ice and elevate operative extremity above  heart  - ABL anemia/Hemodynamics  CBC pending  BP's ok   - Medical issues   Sleep apnea   CPAP   HTN   meds on hold due to renal function   - DVT/PE prophylaxis:  Lovenox   - ID:   Completed periop clindamycin   No further antibiosis   - Activity:  NWB Left Lower extremity   PT/OT as tolerated for now   - FEN/GI prophylaxis/Foley/Lines:  Place on full liquid diet for now  IVF at 125 cc/hr   -Ex-fix/Splint care:  Continue with current splint  Reinforce as needed  - Dispo:  F/u on studies/labs  Continue with inpatient hospitalization     Jari Pigg, PA-C [facility] [phone] (P) [phone] (O) 02/26/2016 9:30 AM

## 2016-02-26 NOTE — Progress Notes (Signed)
ANTICOAGULATION CONSULT NOTE - Initial Consult  Pharmacy Consult for heparin Indication: VTE  Patient Measurements: Weight: 238 lb (108 kg) Heparin Dosing Weight: 100.3  Vital Signs: Temp: (P) 98.8 F (37.1 C) (08/11 2006) Temp Source: (P) Oral (08/11 2006) BP: 120/69 (08/11 2007) Pulse Rate: 99 (08/11 2007)  Labs:  Recent Labs  02/25/16 1516 02/26/16 0449 02/26/16 0951 02/26/16 1006 02/26/16 1231 02/26/16 1412 02/26/16 2001  HGB 12.7*  --  11.1*  --   --   --   --   HCT 38.8*  --  34.5*  --   --   --   --   PLT 196  --  172  --   --   --   --   HEPARINUNFRC  --   --   --   --   --   --  1.05*  CREATININE 1.57* 3.37* 4.08* 4.19*  --   --   --   CKTOTAL  --   --  2,672*  --  2,829*  --   --   CKMB  --   --   --   --  18.0*  --   --   TROPONINI  --   --   --   --   --  <0.03  --     Estimated Creatinine Clearance: 26.8 mL/min (by C-G formula based on SCr of 4.19 mg/dL).  Assessment: 50 yo male s/p L tibiotala fusion due to post traumatic L ankle arthritis. Post surgery complications including significant SCr bump up to 4 and CP. Pharmacy consulted to dose heparin for possible PE. Hgb 11.1, PLT wnl.  First HL came back elevated at 1.05. No issues reported by RN.  Goal of Therapy:  Heparin level 0.3-0.7 units/ml Monitor platelets by anticoagulation protocol: Yes   Plan:  Decrease heparin gtt to 1450 units/hr Check AM HL for further dose adjustments Monitor CBC, s/s of bleed  Elenor Quinones, PharmD, [facility] Clinical Pharmacist Pager [phone] 02/26/2016 8:49 PM

## 2016-02-26 NOTE — Progress Notes (Signed)
Orthopaedic Trauma service Follow up   Followed up on repeat labs. Worsening BUN/Cr, along with elevated CK, phosphorus Urinary labs had not been collected Called RN to verify that these needed to be collected, along with troponins, 12 lead ekg and repeat CK with CKMB Nurse then informed me that rapid response nurse had been called to pts bedside in radiology On arrival pt was having acute chest pain with radiation to jaw along with abdominal pain   Moved pt to radiology holding area for monitoring RR RN at bedside with pt, providing excellent care  Foley placed and fluid boluses given- only 90 cc of urine obtained, although it was light-medium yellow in color  Pt received 2 nitro tabs during stay in radiology holding area with improvement   CCM consulted  Renal consulted   Pt transferred to [place] for continued monitoring  Troponins pending U/A, Urine tox and other urinary labs have been sent off as well and are pending  Greatly appreciate CCM, Renal and RR RN assistance with pt care   Work up is ongoing   Cardiac  Intra-abdominal (mesentaric ischemia, aorta pathology, etc)  Renal (cause of failure likely above)   Pt did not receive contrast   Not on NSAIDs   Antihypertensives have been held since pre-op   Received 3 doses of clindamycin for periop abx coverage due to PCN and cephalosporin allergy     Jari Pigg, PA-C [facility] [phone] (P) 02/26/2016 1:57 PM

## 2016-02-26 NOTE — Progress Notes (Signed)
Pt. placed on CPAP per new order with pts. FFM, 2 lpm added to circuit, humidity filled, tolerating well, RN aware.

## 2016-02-26 NOTE — Consult Note (Signed)
PULMONARY / CRITICAL CARE MEDICINE   Name: [name] MRN: [id number] DOB: 1965/11/10    ADMISSION DATE:  02/25/2016 CONSULTATION DATE:  8/11  REFERRING MD:  Marcelino Scot  CHIEF COMPLAINT:  Renal failure and chest pain   HISTORY OF PRESENT ILLNESS:   50 year old male w/ PMH: HTN, OSA, polysubstance abuse, who is s/p left ankle tibiotalar fusion on 8/10. His intra-operative course was notable for only transient drop in BP (lowest point SBP 75 mmHg) which was treated w/ phenylephrine w/ good hemodynamic response. His EBL was estimated at 125 ml. His post-op course has notable for expected post op bleeding, and some boarderline hypotension. On 8/11 he was noted to have significant bump in his creatinine. He had a nml creatine of 1.0 on 8/8. On 8/11 his creatinine was up to 4.08. He was also having adb pain so he went to radiology on 8/11 for evaluation. While in radiology complained of chest pain so with chest pain and new renal failure PCCM as well as renal were consulted to assist w/ management   PAST MEDICAL HISTORY :  He  has a past medical history of Alcoholism ([place]) (11/03/2015); Arthritis; Chronic lower back pain; Depression; GERD (gastroesophageal reflux disease); Hemorrhoids; History of cocaine abuse (11/03/2015); History of MRSA infection; Hypertension; Joint pain; Kidney stones; Nocturia; OSA on CPAP; Polysubstance abuse; RSD (reflex sympathetic dystrophy), L foot due to calcaneus fx (08/14/2012); Urinary frequency; and Walking pneumonia.  PAST SURGICAL HISTORY: He  has a past surgical history that includes Wrist fracture surgery (Right, 1970's); Incision and drainage of wound (Right, ? 1980's); Laceration repair (1980's); Inguinal hernia repair (2000); Nasal reconstruction [id number]); ORIF calcaneous fracture (08/10/2012); Ankle Fusion (08/10/2012); subtalar fusion (Left); Hardware Removal (Left, 01/10/2013); Hand surgery (1992); Foot surgery (1986); Fracture surgery; Cystoscopy; Ankle Fusion (Left,  02/25/2016); Osteotomy (Left, 02/25/2016); Myringotomy (Bilateral, 1983); and Foot arthrodesis (Left, 02/25/2016).  Allergies  Allergen Reactions  . Bee Venom Swelling    Severe swelling  . Fish Allergy Anaphylaxis    Throat swelling with seafood  . Iodinated Diagnostic Agents Anaphylaxis  . Latex Anaphylaxis  . Penicillins Anaphylaxis    Has patient had a PCN reaction causing immediate rash, facial/tongue/throat swelling, SOB or lightheadedness with hypotension: Yes Has patient had a PCN reaction causing severe rash involving mucus membranes or skin necrosis: No Has patient had a PCN reaction that required hospitalization No Has patient had a PCN reaction occurring within the last 10 years: No If all of the above answers are "NO", then may proceed with Cephalosporin use.     No current facility-administered medications on file prior to encounter.    Current Outpatient Prescriptions on File Prior to Encounter  Medication Sig  . amLODipine (NORVASC) 5 MG tablet Take 1 tablet (5 mg total) by mouth daily.  Marland Kitchen aspirin 81 MG tablet Take 81 mg by mouth daily.  . CHANTIX 0.5 MG tablet Take 0.5 mg by mouth daily.   Marland Kitchen EPIPEN 2-PAK 0.3 MG/0.3ML SOAJ injection INJECT CONTENTS OF 1 SYRINGE INTO THE MUSCLE AS DIRECTED BY YOUR PHYSICIAN FOR ALLERGIC/ANAPHYLACTIC REACTION  . FLUoxetine (PROZAC) 40 MG capsule Take 1 capsule (40 mg total) by mouth daily.  Marland Kitchen lisinopril-hydrochlorothiazide (PRINZIDE,ZESTORETIC) 20-12.5 MG tablet Take 1 tablet by mouth daily.  . Multiple Vitamins-Minerals (MULTIVITAMIN WITH MINERALS) tablet Take 1 tablet by mouth daily.    FAMILY HISTORY:  His indicated that his mother is alive. He indicated that his father is deceased. He indicated that his sister is alive. He  indicated that only one of his three others is alive.    SOCIAL HISTORY: He  reports that he quit smoking about 7 months ago. His smoking use included Cigarettes. He has a 63.00 pack-year smoking history. He  has never used smokeless tobacco. He reports that he drinks alcohol. He reports that he uses drugs, including Cocaine and Marijuana.  REVIEW OF SYSTEMS:   + chest pain-->substernal-->improves w/ SL NTG SUBJECTIVE:  Feels a little better   VITAL SIGNS: BP 97/67 (BP Location: Right Arm)   Pulse 86   Temp 98 F (36.7 C) (Oral)   Resp 20   Wt 238 lb (108 kg)   SpO2 94%   BMI 32.28 kg/m   HEMODYNAMICS:    VENTILATOR SETTINGS:    INTAKE / OUTPUT: I/O last 3 completed shifts: In: 3025 [P.O.:520; I.V.:2300; IV Piggyback:205] Out: 325 [Urine:200; Blood:125]  PHYSICAL EXAMINATION: General:  50 year old male now in the intensive care, now comfortable after NTG Neuro:  Awake, oriented x 3, moves all ext  HEENT:  NCAT, no JVD  Cardiovascular:  rrr w/out MRG Lungs:  Clear and no accessory use  Abdomen:  Firm, + bowel sounds tender to palp Musculoskeletal:  Equal st and bulk  Skin:  Warm, dry intact left ankle wrapped   LABS:  BMET  Recent Labs Lab 02/26/16 0449 02/26/16 0951 02/26/16 1006  NA 137 134* 134*  K 5.1 4.5 4.5  CL 101 101 101  CO2 25 21* 22  BUN 37* 41* 41*  CREATININE 3.37* 4.08* 4.19*  GLUCOSE 118* 108* 108*    Electrolytes  Recent Labs Lab 02/26/16 0449 02/26/16 0951 02/26/16 1006  CALCIUM 8.1* 8.0* 8.0*  MG  --  2.0  --   PHOS  --  7.5* 7.5*    CBC  Recent Labs Lab 02/23/16 0936 02/25/16 1516 02/26/16 0951  WBC 7.9 12.5* 9.7  HGB 14.6 12.7* 11.1*  HCT 43.0 38.8* 34.5*  PLT 211 196 172    Coag's No results for input(s): APTT, INR in the last 168 hours.  Sepsis Markers No results for input(s): LATICACIDVEN, PROCALCITON, O2SATVEN in the last 168 hours.  ABG No results for input(s): PHART, PCO2ART, PO2ART in the last 168 hours.  Liver Enzymes  Recent Labs Lab 02/26/16 0951 02/26/16 1006  AST 38  --   ALT 36  --   ALKPHOS 74  --   BILITOT 0.7  --   ALBUMIN 3.5 3.5    Cardiac Enzymes No results for input(s):  TROPONINI, PROBNP in the last 168 hours.  Glucose No results for input(s): GLUCAP in the last 168 hours.  Imaging Dg Chest 1 View  Result Date: 02/26/2016 CLINICAL DATA:  abd pain today with nausea; post foot surgery X1 day ago; hx HTN, smoker EXAM: CHEST 1 VIEW COMPARISON:  09/21/2014 FINDINGS: There is mild perihilar atelectasis or early infiltrate. Minimal left lower lobe atelectasis is present. No pulmonary edema. Heart size is normal. Low lung volumes. No free intraperitoneal air beneath the diaphragm. IMPRESSION: Perihilar opacities consistent atelectasis or early infiltrate. Electronically Signed   By: Nolon Nations M.D.   On: 02/26/2016 12:11   Dg Abd 1 View  Result Date: 02/26/2016 CLINICAL DATA:  Nausea today with abd pain; post foot surgery X1 day ago; decub taken as part of acute abd series and pt's condition worsened. KUB to be done portably later. EXAM: ABDOMEN - 1 VIEW COMPARISON:  CT of the abdomen and pelvis on 09/08/2014 FINDINGS: Single  decubitus view of the abdomen was possible prior to change in the patient's condition. There is no evidence for free intraperitoneal air. Visualized bowel loops are not dilated. Air-fluid levels are identified within nondilated loops of small bowel. Moderate stool burden is noted. IMPRESSION: No acute abnormality identified on the limited study. KUB is scheduled when the patient is able. Electronically Signed   By: Nolon Nations M.D.   On: 02/26/2016 12:10   Dg Ankle Left Port  Result Date: 02/25/2016 CLINICAL DATA:  Status post ankle fusion EXAM: PORTABLE LEFT ANKLE - 2 VIEW COMPARISON:  August 10, 2012 FINDINGS: The patient is status post ankle fusion. There is resection of the distal fibula. A rod extends through the distal tibia into the calcaneus and talus. The rod is affixed with interlocking screws. A single screw traverses the length of the calcaneus. The remainder of the calcaneal hardware has been removed. No other acute  abnormalities. IMPRESSION: Fusion of the ankle as described above. Electronically Signed   By: Dorise Bullion III M.D   On: 02/25/2016 17:31     STUDIES:  LE [place] 8/11>> ECHO 8/11>>>  CULTURES:   ANTIBIOTICS:   SIGNIFICANT EVENTS:   LINES/TUBES:  DISCUSSION: 50 year old male s/p left ankle fusion 8/10. Post-op course has been c/b expected surgical site bleeding and boarderline hypotension. During am rounds he was having sig abd pain and had marked increase in scr from 1.0 (on admit) to 4.19, While in XRAY dep developed chest pain which resolved w/ NTG. He is now in the ICU w/ new CP and renal failure. He did have some hypoxia but doubt PE. Concerned that this all may be cardiac in origin. Suspect that the renal failure was hemodynamically mediated. For now the plan is as follows: oxygen, asa, IV heparin, LE [place] to r/o DVT, check lactic and cycle CEs. If CEs elevated think we should involve cardiology.   ASSESSMENT / PLAN:  PULMONARY A: OSA on CPAP Hypoxia -->likely hypoventilation but need to r/o PE P:   O2 Pulse ox Lower ext dopplers Heparin for now-->if dopplers neg then dc 8/12  CARDIOVASCULAR A:  H/o HTN-->currently hypotensive  Chest pain -->EKG negative but resolves w/ NTG  P:  Fluid challenge Hold all antihypertensives Cycle CEs Get echo  F/u ekg  PRN ntg May need cards eval   RENAL A:   AKI-->likely hemodynamically mediated but consider rhabdo (cks 2672) Mild hyponatremia  Hyperphosphatemia  P:   IV hydration Urine myoglobin  Strict I&O  GASTROINTESTINAL A:   H/o GERD abd pain-->gas pattern essentially nml ? mesenteric ischemia  P:   NPO except for meds Reflux precaution ppi oral  Ck lactic acid   HEMATOLOGIC A:   Post-op anemia  P:  Trend cbc  INFECTIOUS A:   No acute  P:   Trend fever and WBC curve   ENDOCRINE A:   No acute  P:   Trend cbc  NEUROLOGIC/muscular skeletal  A:   S/p left ankle tibiotalar fusion 8/10 Post-op  pain H/o drug and ETOH abuse  P:   ICU monitoring PRN analgesia  NWB X 8 weeks Ice and elevate leg when not doing PT  FAMILY  - Updates:   - Inter-disciplinary family meet or Palliative Care meeting due by:  8/18  Erick Colace ACNP-BC [place] Pager # [phone] OR # [phone] if no answer  02/26/2016, 12:17 PM

## 2016-02-26 NOTE — Progress Notes (Signed)
PT Cancellation Note  Patient Details Name: [name] MRN: [id number] DOB: Jul 06, 1966   Cancelled Treatment:    Reason Eval/Treat Not Completed: Patient declined, no reason specified. Pt stated "20/10" pain in his L foot and requested pain meds from his nurse. Therapist called pt's nurse to inform her of his current pain and request for medication. PT will continue to f/u with pt as appropriate.   Clearnce Sorrel Alyzabeth Pontillo 02/26/2016, 10:07 AM Sherie Don, PT, DPT [phone]

## 2016-02-26 NOTE — Progress Notes (Signed)
[facility]   Spoke with mom and gave update, informed her that workup is still ongoing  Very appreciative  She plans to come by to see Lealon this evening  Jari Pigg, PA-C [facility] [phone] (P) 02/26/2016 3:37 PM

## 2016-02-26 NOTE — Progress Notes (Addendum)
ANTICOAGULATION CONSULT NOTE - Initial Consult  Pharmacy Consult for heparin Indication: VTE  Patient Measurements: Weight: 238 lb (108 kg) Heparin Dosing Weight: 100.3  Vital Signs: Temp: 98 F (36.7 C) (08/11 0520) Temp Source: Oral (08/11 0520) BP: 97/67 (08/11 0520) Pulse Rate: 86 (08/11 0520)  Labs:  Recent Labs  02/25/16 1516 02/26/16 0449 02/26/16 0951 02/26/16 1006 02/26/16 1231  HGB 12.7*  --  11.1*  --   --   HCT 38.8*  --  34.5*  --   --   PLT 196  --  172  --   --   CREATININE 1.57* 3.37* 4.08* 4.19*  --   CKTOTAL  --   --  2,672*  --  2,829*  CKMB  --   --   --   --  18.0*    Estimated Creatinine Clearance: 26.8 mL/min (by C-G formula based on SCr of 4.19 mg/dL).  Assessment: 50 yo male s/p L tibiotala fusion due to post traumatic L ankle arthritis. Post surgery complications including significant SCr bump up to 4 and CP. Pharmacy consulted to dose heparin for possible PE. Hgb 11.1, PLT wnl.  Goal of Therapy:  Heparin level 0.3-0.7 units/ml Monitor platelets by anticoagulation protocol: Yes   Plan:  Give 5000 units bolus x 1 Start heparin infusion at 1700 units/hr Check anti-Xa level in 6 hours and daily while on heparin Continue to monitor H&H and platelets  Melburn Popper, PharmD Clinical Pharmacy Resident Pager: [phone] 02/26/16 1:57 PM

## 2016-02-26 NOTE — Significant Event (Addendum)
Rapid Response Event Note  Overview: Time Called: 1104 Arrival Time: 1110 Event Type: Cardiac, Respiratory, Other (Comment)  Initial Focused Assessment: Called by hospital operator to assess patient in radiology.  Upon arrival, patient was experiencing 9/10 substernal chest pain.  Radiology staff placing patient on monitors, patient was awake and neuro intact. S/P ortho surgery to L ankle.  Patient was in radiology for ABD films when CP started. Once on monitor, oxygen sats in the mid 80s. Patient was SOB from the CP and was also experiencing jaw pain. Patient was flushed in his face, + pulses, abdomen tender/distended, patient had not voided in a few hours. Ortho PA at  bedside  Interventions: --patient moved to IR holding, placed on continuous monitor. --VS SBP in the low 90s, MAPs 55-60, patient's BP baseline 120-130s/ 70s, HR in   the 80-90s, SR -- EKG obtained, normal per EKG --O2 administered 2LNC, sats improved. --initiated 250cc bolus at 1210 --labs ordered, labs drawn by lab tech. --CP present 8/10, patient given NTG SL at 1212, CP alleviated after NTG given to        2/10 --initiated 250cc bolus at 1225 --CP present 7/10, patient given NTG SL on 1235, CP alleviated after NTG given to       2/10 -- foley placed at 1245, UO 90cc, sent urine for ordered labs. -- Patient to be seen by renal (CR ~ 4.0) and CCM.  CCM assessed patient, patient    to go to CICU.  -- BP in the SBP upper 70s-low 80s, initiated another 500cc NS bolus and patient to receive another 1L NS bolus after.  Total fluids given 1L via RR-RN.  Plan of Care (if not transferred): transferred to [facility]   Event Summary: Name of Physician Notified: Ainsley Spinner at 1110, CCM MD at bedside to evaluate patient    at    Outcome: Transferred (Comment)  Event End Time: [place], [place]

## 2016-02-26 NOTE — Anesthesia Postprocedure Evaluation (Signed)
Anesthesia Post Note  Patient: [name]  Procedure(s) Performed: Procedure(s) (LRB): TIBIO TALAR FUSION (Left)  Patient location during evaluation: PACU Anesthesia Type: General Level of consciousness: awake and alert Pain management: pain level controlled Vital Signs Assessment: post-procedure vital signs reviewed and stable Respiratory status: spontaneous breathing, nonlabored ventilation and respiratory function stable Cardiovascular status: blood pressure returned to baseline and stable Postop Assessment: no signs of nausea or vomiting Anesthetic complications: no    Last Vitals:  Vitals:   02/26/16 0400 02/26/16 0520  BP:  97/67  Pulse: 71 86  Resp: 20   Temp:  36.7 C    Last Pain:  Vitals:   02/26/16 1039  TempSrc:   PainSc: 2                  Vencil Basnett A
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
## 2016-02-26 NOTE — Progress Notes (Signed)
**  Preliminary report by tech**  Bilateral lower extremity venous duplex completed. No evidence of deep vein or superficial thrombosis involving the right lower extremity and left lower extremity. Unable to visualize the left posterior tibial veins and peroneal veins due to surgical bandages. All other visualized vessels appear patent and compressible. There is no evidence of Baker's cysts bilaterally.   02/26/16 3:51 PM [name] RVT

## 2016-02-26 NOTE — Progress Notes (Signed)
Received call from x ray, patient was experiencing shortness of breath and chest pain therefore I informed rapid response. Patient transferred to [facility] due to shortness of breath, chest pain, and jaw pain during xray this a.m.

## 2016-02-27 ENCOUNTER — Inpatient Hospital Stay (HOSPITAL_COMMUNITY): Payer: 59

## 2016-02-27 ENCOUNTER — Other Ambulatory Visit (HOSPITAL_COMMUNITY): Payer: Self-pay

## 2016-02-27 DIAGNOSIS — N179 Acute kidney failure, unspecified: Secondary | ICD-10-CM

## 2016-02-27 DIAGNOSIS — R079 Chest pain, unspecified: Secondary | ICD-10-CM

## 2016-02-27 LAB — RENAL FUNCTION PANEL
Albumin: 3.2 g/dL — ABNORMAL LOW (ref 3.5–5.0)
Anion gap: 10 (ref 5–15)
BUN: 41 mg/dL — ABNORMAL HIGH (ref 6–20)
CO2: 20 mmol/L — ABNORMAL LOW (ref 22–32)
Calcium: 7.8 mg/dL — ABNORMAL LOW (ref 8.9–10.3)
Chloride: 105 mmol/L (ref 101–111)
Creatinine, Ser: 2.72 mg/dL — ABNORMAL HIGH (ref 0.61–1.24)
GFR calc Af Amer: 30 mL/min — ABNORMAL LOW (ref >60)
GFR calc non Af Amer: 26 mL/min — ABNORMAL LOW (ref >60)
Glucose, Bld: 89 mg/dL (ref 65–99)
Phosphorus: 4.1 mg/dL (ref 2.5–4.6)
Potassium: 4.3 mmol/L (ref 3.5–5.1)
Sodium: 135 mmol/L (ref 135–145)

## 2016-02-27 LAB — CBC
HCT: 31.8 % — ABNORMAL LOW (ref 39.0–52.0)
Hemoglobin: 10.2 g/dL — ABNORMAL LOW (ref 13.0–17.0)
MCH: 29.1 pg (ref 26.0–34.0)
MCHC: 32.1 g/dL (ref 30.0–36.0)
MCV: 90.6 fL (ref 78.0–100.0)
Platelets: 171 10*3/uL (ref 150–400)
RBC: 3.51 MIL/uL — ABNORMAL LOW (ref 4.22–5.81)
RDW: 14 % (ref 11.5–15.5)
WBC: 7.9 10*3/uL (ref 4.0–10.5)

## 2016-02-27 LAB — HEPARIN LEVEL (UNFRACTIONATED)
Heparin Unfractionated: 0.77 IU/mL — ABNORMAL HIGH (ref 0.30–0.70)
Heparin Unfractionated: 0.43 IU/mL (ref 0.30–0.70)

## 2016-02-27 LAB — TROPONIN I: Troponin I: <0.03 ng/mL (ref <0.03)

## 2016-02-27 LAB — CALCIUM, IONIZED: Calcium, Ionized, Serum: 4.1 mg/dL — ABNORMAL LOW (ref 4.5–5.6)

## 2016-02-27 MED ORDER — TECHNETIUM TC 99M DIETHYLENETRIAME-PENTAACETIC ACID: 31.5000 | Freq: Once | INTRAVENOUS | Status: DC | PRN

## 2016-02-27 MED ORDER — TECHNETIUM TO 99M ALBUMIN AGGREGATED
4.3000 | Freq: Once | INTRAVENOUS | Status: AC | PRN
  Administered 2016-02-27: 4 via INTRAVENOUS

## 2016-02-27 MED ORDER — AMLODIPINE BESYLATE 5 MG PO TABS
5.0000 mg | ORAL_TABLET | Freq: Every day | ORAL | Status: DC
  Administered 2016-02-27 – 2016-02-29 (×3): 5 mg via ORAL
  Filled 2016-02-27 (×3): qty 1

## 2016-02-27 NOTE — Progress Notes (Signed)
ANTICOAGULATION CONSULT NOTE - Follow Up Consult  Pharmacy Consult for heparin Indication: r/o PE  Patient Measurements: Weight: 238 lb (108 kg) Heparin Dosing Weight: 100.3  Labs:  Recent Labs  02/25/16 1516  02/26/16 0951 02/26/16 1006 02/26/16 1231 02/26/16 1412 02/26/16 2001 02/27/16 0117 02/27/16 0118  HGB 12.7*  --  11.1*  --   --   --   --  10.2*  --   HCT 38.8*  --  34.5*  --   --   --   --  31.8*  --   PLT 196  --  172  --   --   --   --  171  --   HEPARINUNFRC  --   --   --   --   --   --  1.05*  --  0.77*  CREATININE 1.57*  < > 4.08* 4.19*  --   --   --   --  2.72*  CKTOTAL  --   --  2,672*  --  2,829*  --   --   --   --   CKMB  --   --   --   --  18.0*  --   --   --   --   TROPONINI  --   --   --   --   --  <0.03 0.03* <0.03  --   < > = values in this interval not displayed.   Assessment: 50yo male on IV heparin for PE.   Heparin level remains slightly elevated at 0.77 after decrease to 1300 units/hr.  H/H slowly trending down. Platelets are within normal limits. SCr is improving.   Goal of Therapy:  Heparin level 0.3-0.7 units/ml   Plan:  Reduce heparin slightly to 1200 units/hr Daily heparin level and CBC.   Sloan Leiter, PharmD, BCPS Clinical Pharmacist [phone] 02/27/2016,12:03 PM

## 2016-02-27 NOTE — Progress Notes (Signed)
PULMONARY / CRITICAL CARE MEDICINE   Name: CAMARI TRAVERS MRN: [id number] DOB: Oct 11, 1965    ADMISSION DATE:  02/25/2016 CONSULTATION DATE:  8/11  REFERRING MD:  Marcelino Scot  CHIEF COMPLAINT:  Renal failure and chest pain   SUBJECTIVE:  Breathing better.  Still has chest discomfort.  Denies nausea.   VITAL SIGNS: BP (!) 144/85   Pulse 100   Temp 98.5 F (36.9 C) (Oral)   Resp 19   Wt 238 lb (108 kg)   SpO2 94%   BMI 32.28 kg/m   INTAKE / OUTPUT: I/O last 3 completed shifts: In: 3668.3 [I.V.:3463.3; IV [id number] Out: 2390 [Urine:2390]  PHYSICAL EXAMINATION: General: sitting in chair Neuro: follows commands HEENT: no stridor Cardiac: regular, no murmur Chest: no wheeze Abd: soft, non tender Ext: Lt leg in wrap Skin: no rashes  LABS:  BMET  Recent Labs Lab 02/26/16 0951 02/26/16 1006 02/27/16 0118  NA 134* 134* 135  K 4.5 4.5 4.3  CL 101 101 105  CO2 21* 22 20*  BUN 41* 41* 41*  CREATININE 4.08* 4.19* 2.72*  GLUCOSE 108* 108* 89    Electrolytes  Recent Labs Lab 02/26/16 0951 02/26/16 1006 02/27/16 0118  CALCIUM 8.0* 8.0* 7.8*  MG 2.0  --   --   PHOS 7.5* 7.5* 4.1    CBC  Recent Labs Lab 02/25/16 1516 02/26/16 0951 02/27/16 0117  WBC 12.5* 9.7 7.9  HGB 12.7* 11.1* 10.2*  HCT 38.8* 34.5* 31.8*  PLT 196 172 171    Coag's No results for input(s): APTT, INR in the last 168 hours.  Sepsis Markers  Recent Labs Lab 02/26/16 1412 02/26/16 1440  LATICACIDVEN 1.5 1.4    ABG  Recent Labs Lab 02/26/16 1245  PHART 7.330*  PCO2ART 38.9  PO2ART 81.8    Liver Enzymes  Recent Labs Lab 02/26/16 0951 02/26/16 1006 02/27/16 0118  AST 38  --   --   ALT 36  --   --   ALKPHOS 74  --   --   BILITOT 0.7  --   --   ALBUMIN 3.5 3.5 3.2*    Cardiac Enzymes  Recent Labs Lab 02/26/16 1412 02/26/16 2001 02/27/16 0117  TROPONINI <0.03 0.03* <0.03    Glucose No results for input(s): GLUCAP in the last 168 hours.  Imaging Dg  Chest 1 View  Result Date: 02/26/2016 CLINICAL DATA:  abd pain today with nausea; post foot surgery X1 day ago; hx HTN, smoker EXAM: CHEST 1 VIEW COMPARISON:  09/21/2014 FINDINGS: There is mild perihilar atelectasis or early infiltrate. Minimal left lower lobe atelectasis is present. No pulmonary edema. Heart size is normal. Low lung volumes. No free intraperitoneal air beneath the diaphragm. IMPRESSION: Perihilar opacities consistent atelectasis or early infiltrate. Electronically Signed   By: Nolon Nations M.D.   On: 02/26/2016 12:11   Dg Abd 1 View  Result Date: 02/26/2016 CLINICAL DATA:  Nausea today with abd pain; post foot surgery X1 day ago; decub taken as part of acute abd series and pt's condition worsened. KUB to be done portably later. EXAM: ABDOMEN - 1 VIEW COMPARISON:  CT of the abdomen and pelvis on 09/08/2014 FINDINGS: Single decubitus view of the abdomen was possible prior to change in the patient's condition. There is no evidence for free intraperitoneal air. Visualized bowel loops are not dilated. Air-fluid levels are identified within nondilated loops of small bowel. Moderate stool burden is noted. IMPRESSION: No acute abnormality identified on the limited study. KUB  is scheduled when the patient is able. Electronically Signed   By: Nolon Nations M.D.   On: 02/26/2016 12:10   Dg Abd Portable 1v  Result Date: 02/26/2016 CLINICAL DATA:  Abdominal distention. EXAM: PORTABLE ABDOMEN - 1 VIEW COMPARISON:  02/26/2016 FINDINGS: The bowel gas pattern is normal. No radio-opaque calculi or other significant radiographic abnormality are seen. Study quality is degraded by portable supine exam. IMPRESSION: No evidence for acute  abnormality. Electronically Signed   By: Nolon Nations M.D.   On: 02/26/2016 17:19    STUDIES:  8/11 Doppler legs b/l >> no DVT 8/12 Echo >>  8/12 V/Q scan >>  SIGNIFICANT EVENTS:   DISCUSSION: 50 yo male with complex Lt calcaneus fx in December 2014 with  progressive ankle pain.  He had fusion 8/10.  Post op developed chest pain, dyspnea, and AKI.  ASSESSMENT / PLAN:  Chest pain. Hx of HTN. - f/u Echo and V/Q scan >> if negative, then d/c heparin gtt - resume norvasc  AKI likely from volume depletion and ACE inhibitor use. - continue IV fluids  Hx of OSA. - CPAP qhs  Hx of GERD. Nutrition. - advance diet  Anemia after surgery. - f/u CBC  S/p Lt ankle fusion 8/10. - post-op care per ortho  Hx of cocaine, ETOH abuse. Hx of depression. - monitor mental status - continue prozac, thiamine, folic acid, MVI - CIWA protocol  Will transfer to telemetry.   Chesley Mires, MD [facility] Pulmonary/Critical Care 02/27/2016, 8:49 AM Pager:  [phone] After 3pm call: [phone]

## 2016-02-27 NOTE — Progress Notes (Signed)
[facility] Progress Note  Subjective  Feeling better this am  Joking around this am- asking if he is ready for discharge to home today  Still with some chest discomfort but better Pain in L leg tolerable  Abdomen feels better   ROS As above   Objective   BP 129/80   Pulse 93   Temp 98.5 F (36.9 C) (Oral)   Resp 16   Ht 6' (1.829 m)   Wt 108 kg (238 lb 1.6 oz)   SpO2 92%   BMI 32.29 kg/m   Intake/Output      08/11 0701 - 08/12 0700 08/12 0701 - 08/13 0700   P.O.     I.V. (mL/kg) 3001.3 (27.8) 276 (2.6)   IV Piggyback     Total Intake(mL/kg) 3001.3 (27.8) 276 (2.6)   Urine (mL/kg/hr) 2390 (0.9) 350 (1)   Blood     Total Output 2390 350   Net +611.3 -74        Urine Occurrence  1 x     Labs  Results for Justin, Larsen (MRN [id number]) as of 02/27/2016 10:17  Ref. Range 02/26/2016 20:01 02/27/2016 01:17 02/27/2016 01:18  Sodium Latest Ref Range: 135 - 145 mmol/L   135  Potassium Latest Ref Range: 3.5 - 5.1 mmol/L   4.3  Chloride Latest Ref Range: 101 - 111 mmol/L   105  CO2 Latest Ref Range: 22 - 32 mmol/L   20 (L)  BUN Latest Ref Range: 6 - 20 mg/dL   41 (H)  Creatinine Latest Ref Range: 0.61 - 1.24 mg/dL   2.72 (H)  Calcium Latest Ref Range: 8.9 - 10.3 mg/dL   7.8 (L)  EGFR (Non-African Amer.) Latest Ref Range: >60 mL/min   26 (L)  EGFR (African American) Latest Ref Range: >60 mL/min   30 (L)  Glucose Latest Ref Range: 65 - 99 mg/dL   89  Anion gap Latest Ref Range: 5 - 15    10  Phosphorus Latest Ref Range: 2.5 - 4.6 mg/dL   4.1  Albumin Latest Ref Range: 3.5 - 5.0 g/dL   3.2 (L)  Troponin I Latest Ref Range: <0.03 ng/mL 0.03 (HH) <0.03   WBC Latest Ref Range: 4.0 - 10.5 K/uL  7.9   RBC Latest Ref Range: 4.22 - 5.81 MIL/uL  3.51 (L)   Hemoglobin Latest Ref Range: 13.0 - 17.0 g/dL  10.2 (L)   HCT Latest Ref Range: 39.0 - 52.0 %  31.8 (L)   MCV Latest Ref Range: 78.0 - 100.0 fL  90.6   MCH Latest Ref Range: 26.0 - 34.0 pg  29.1   MCHC Latest  Ref Range: 30.0 - 36.0 g/dL  32.1   RDW Latest Ref Range: 11.5 - 15.5 %  14.0   Platelets Latest Ref Range: 150 - 400 K/uL  171   Heparin Unfractionated Latest Ref Range: 0.30 - 0.70 IU/mL 1.05 (H)  0.77 (H)  Results for Justin, Larsen (MRN [id number]) as of 02/27/2016 10:17  Ref. Range 02/26/2016 14:12 02/26/2016 14:40  Lactic Acid, Venous Latest Ref Range: 0.5 - 1.9 mmol/L 1.5 1.4   Results for Justin, Larsen (MRN [id number]) as of 02/27/2016 10:17  Ref. Range 02/26/2016 12:31  D-Dimer, Quant Latest Ref Range: 0.00 - 0.50 ug/mL-FEU 0.95 (H)   Exam  Gen: awake and alert, appears more comfortable  Ext:   Left Lower Extremity  Dressing stable                        DPN, SPN, TN sensation grossly intact                       Ext warm                       Brisk cap refill                       + DP pulse                       EHL, FHL, lesser toe motor functions intact   Expected degree of swelling      Assessment and Plan   POD/HD#: 2  50 y/o male s/p L tibiotalar fusion due to post-traumatic L ankle arthritis      -L ankle arthritis s/p tibiotalar fusion                        NWB L Lex x 8 weeks                       Continue with current dressing and reinforce as needed                                             Not surprised with the amount of bleeding given the procedure performed                       Ok to work with PT/OT                       Ice and elevate leg above heart when not working with PT/OT                       Will remove splint in 2 weeks and convert to [facility]    - acute renal failure, possible MI, possible PE   Renal function improving with fluids/supportive care   Pt scheduled for Echo and VQ scan today                           dopplers negative for DVT    - Pain management:                       continue with current regimen     - ABL anemia/Hemodynamics                       improved   H/h stable    - Medical issues                         Sleep apnea                                             CPAP  HTN                                           management per CCM   - DVT/PE prophylaxis:                       on heparin gtt   - ID:                        Completed periop clindamycin                        No further antibiosis    - Activity:                       NWB Left Lower extremity                        PT/OT as tolerated for now    - FEN/GI prophylaxis/Foley/Lines:                       Place on full liquid diet for now                       IVF at 125 cc/hr    -Ex-fix/Splint care:                       Continue with current splint                       Reinforce as needed   - Dispo:                       being transferred to tele     Additional studies pending   Continue with inpatient care   Jari Pigg, PA-C [facility] [phone] ([phone] (O) 02/27/2016 10:16 AM

## 2016-02-27 NOTE — Progress Notes (Signed)
Patient placed on CPAP and doing well. Full face mask and is able to remove and place mask as needed. Patient will call if any assistance needed.

## 2016-02-27 NOTE — Evaluation (Signed)
Occupational Therapy Evaluation Patient Details Name: [name] MRN: [id number] DOB: 1966-03-24 Today's Date: 02/27/2016    History of Present Illness Pt is a 50 y/o male s/p tibiotalar fusion surgery secondary to prior history of a complex, post-traumatic L ankle fx. PMH including but not limited to ORIF for L calcaneus fx in 2014, hardware removal in 2014, HTN, ETOH and hx of cocaine abuse.  Post op developed chest pain, dyspnea, AKI.  8/11 doppler Bil LEs, no DVT.  8/12 V/Q scan, Very low probability for pulmonary embolism.    Clinical Impression   Pt was performing self care at a modified independent level and ambulating with crutches prior to this admission. Pt presents with L LE pain, decreased standing balance, impulsivity and wheezing. Will follow acutely. Recommending safety eval at home as pt lives alone.    Follow Up Recommendations  Home health OT (safety assessment for IADL)    Equipment Recommendations   (pt does not want a 3 in 1)    Recommendations for Other Services       Precautions / Restrictions Precautions Precautions: Fall Restrictions Weight Bearing Restrictions: Yes LLE Weight Bearing: Non weight bearing      Mobility Bed Mobility Overal bed mobility: Needs Assistance Bed Mobility: Supine to Sit;Sit to Supine     Supine to sit: Modified independent (Device/Increase time) Sit to supine: Modified independent (Device/Increase time)   General bed mobility comments: from long sitting  Transfers Overall transfer level: Needs assistance Equipment used: Rolling walker (2 wheeled) Transfers: Sit to/from Stand Sit to Stand: Supervision         General transfer comment: supervision for safety, pt with impulsivity, cued to slow pace, no dizziness or LOB    Balance Overall balance assessment: Needs assistance Sitting-balance support: No upper extremity supported;Feet supported Sitting balance-Leahy Scale: Fair     Standing balance support:  Bilateral upper extremity supported;During functional activity Standing balance-Leahy Scale: Poor Standing balance comment: Relies on RW for support                            ADL Overall ADL's : Needs assistance/impaired Eating/Feeding: Independent;Sitting   Grooming: Oral care;Standing;Supervision/safety   Upper Body Bathing: Set up;Sitting   Lower Body Bathing: Supervison/ safety;Sit to/from stand   Upper Body Dressing : Set up;Sitting   Lower Body Dressing: Supervision/safety;Sit to/from stand   Toilet Transfer: Supervision/safety;Ambulation;RW   Toileting- Clothing Manipulation and Hygiene: Supervision/safety;Sit to/from stand       Functional mobility during ADLs: Supervision/safety;Rolling walker;Cueing for safety General ADL Comments: Pt is knowledgeable in compensatory strategies for ADL. Sits on shower seat and swings R LE over edge of tub leaving the L on the outside of the curtain.     Vision     Perception     Praxis      Pertinent Vitals/Pain Pain Assessment: Faces Pain Score: 10-Worst pain ever Faces Pain Scale: Hurts even more Pain Location: L ankle Pain Descriptors / Indicators: Aching Pain Intervention(s): Repositioned     Hand Dominance Right   Extremity/Trunk Assessment Upper Extremity Assessment Upper Extremity Assessment: Overall WFL for tasks assessed   Lower Extremity Assessment Lower Extremity Assessment: Defer to PT evaluation   Cervical / Trunk Assessment Cervical / Trunk Assessment: Normal   Communication Communication Communication: No difficulties   Cognition Arousal/Alertness: Awake/alert Behavior During Therapy: Impulsive Overall Cognitive Status: Within Functional Limits for tasks assessed  General Comments       Exercises      Shoulder Instructions      Home Living Family/patient expects to be discharged to:: Private residence Living Arrangements: Alone Available Help at  Discharge: Family;Available PRN/intermittently Type of Home: House Home Access: Stairs to enter [employer] of Steps: 1 Entrance Stairs-Rails: Right;Left;Can reach both Home Layout: One level     Bathroom Shower/Tub: Teacher, early years/pre: Standard     Home Equipment: Environmental consultant - 2 wheels;Crutches;Shower seat;Wheelchair - manual          Prior Functioning/Environment Level of Independence: Independent with assistive device(s)        Comments: pt using bilateral axillary crutches to ambulate     OT Diagnosis: Generalized weakness;Acute pain   OT Problem List: Decreased activity tolerance;Impaired balance (sitting and/or standing);Decreased safety awareness;Pain   OT Treatment/Interventions: Self-care/ADL training;DME and/or AE instruction;Patient/family education;Balance training;Therapeutic activities;Energy conservation    OT Goals(Current goals can be found in the care plan section) Acute Rehab OT Goals Patient Stated Goal: return home OT Goal Formulation: With patient Time For Goal Achievement: 03/05/16 Potential to Achieve Goals: Good ADL Goals Pt Will Perform Grooming: with modified independence;standing Pt Will Perform Lower Body Bathing: with modified independence;sit to/from stand Pt Will Perform Lower Body Dressing: with modified independence;sit to/from stand Pt Will Transfer to Toilet: with modified independence;ambulating;regular height toilet Pt Will Perform Toileting - Clothing Manipulation and hygiene: with modified independence;sit to/from stand Additional ADL Goal #1: Pt will gather items for ADL with RW safely maintaining NWB precautions.  OT Frequency: Min 2X/week   Barriers to D/C:            Co-evaluation              End of Session Equipment Utilized During Treatment: Gait belt;Rolling walker Nurse Communication: Other (comment);Weight bearing status (pt with audible wheezing)  Activity Tolerance: Patient  tolerated treatment well Patient left: in bed;with call bell/phone within reach;with nursing/sitter in room   Time: 1557-1614 OT Time Calculation (min): 17 min Charges:  OT General Charges $OT Visit: 1 Procedure OT Evaluation $OT Eval Moderate Complexity: 1 Procedure G-Codes:    Malka So 02/27/2016, 4:38 PM  [phone]

## 2016-02-27 NOTE — Progress Notes (Signed)
Assessment:  1 AKI, hemodynamically mediated--recovering  Plan: 1 Supportive care--We will sign off   Subjective: Good UOP  Objective: Vital signs in last 24 hours: Temp:  [98.2 F (36.8 C)-99.5 F (37.5 C)] 98.5 F (36.9 C) (08/12 0750) Pulse Rate:  [83-125] 100 (08/12 0750) Resp:  [14-23] 19 (08/12 0750) BP: (79-144)/(40-88) 144/85 (08/12 0750) SpO2:  [85 %-100 %] 94 % (08/12 0750) Weight change:   Intake/Output from previous day: 08/11 0701 - 08/12 0700 In: 2863.3 [I.V.:2863.3] Out: 2390 [Urine:2390] Intake/Output this shift: No intake/output data recorded.  General appearance: alert and cooperative Resp: clear to auscultation bilaterally Chest wall: no tenderness Cardio: regular rate and rhythm, S1, S2 normal, no murmur, click, rub or gallop  Lab Results:  Recent Labs  02/26/16 0951 02/27/16 0117  WBC 9.7 7.9  HGB 11.1* 10.2*  HCT 34.5* 31.8*  PLT 172 171   BMET:  Recent Labs  02/26/16 1006 02/27/16 0118  NA 134* 135  K 4.5 4.3  CL 101 105  CO2 22 20*  GLUCOSE 108* 89  BUN 41* 41*  CREATININE 4.19* 2.72*  CALCIUM 8.0* 7.8*   No results for input(s): PTH in the last 72 hours. Iron Studies: No results for input(s): IRON, TIBC, TRANSFERRIN, FERRITIN in the last 72 hours. Studies/Results: Dg Chest 1 View  Result Date: 02/26/2016 CLINICAL DATA:  abd pain today with nausea; post foot surgery X1 day ago; hx HTN, smoker EXAM: CHEST 1 VIEW COMPARISON:  09/21/2014 FINDINGS: There is mild perihilar atelectasis or early infiltrate. Minimal left lower lobe atelectasis is present. No pulmonary edema. Heart size is normal. Low lung volumes. No free intraperitoneal air beneath the diaphragm. IMPRESSION: Perihilar opacities consistent atelectasis or early infiltrate. Electronically Signed   By: Nolon Nations M.D.   On: 02/26/2016 12:11   Dg Ankle Complete Left  Result Date: 02/25/2016 CLINICAL DATA:  Left tibiotalar fusion EXAM: DG C-ARM 61-120 MIN;  LEFT ANKLE COMPLETE - 3+ VIEW COMPARISON:  07/27/2015 left foot CT FINDINGS: Fluoroscopy time 1 minutes 1 second. 4 nondiagnostic spot fluoroscopic intraoperative radiographs of the left ankle demonstrate postsurgical changes from intra medullary rod traversing the distal left tibia, talus and calcaneus with interlocking tibial, talar and calcaneal screws. IMPRESSION: Intraoperative fluoroscopic guidance for left tibiotalar fusion. Electronically Signed   By: Ilona Sorrel M.D.   On: 02/25/2016 13:01   Dg Abd 1 View  Result Date: 02/26/2016 CLINICAL DATA:  Nausea today with abd pain; post foot surgery X1 day ago; decub taken as part of acute abd series and pt's condition worsened. KUB to be done portably later. EXAM: ABDOMEN - 1 VIEW COMPARISON:  CT of the abdomen and pelvis on 09/08/2014 FINDINGS: Single decubitus view of the abdomen was possible prior to change in the patient's condition. There is no evidence for free intraperitoneal air. Visualized bowel loops are not dilated. Air-fluid levels are identified within nondilated loops of small bowel. Moderate stool burden is noted. IMPRESSION: No acute abnormality identified on the limited study. KUB is scheduled when the patient is able. Electronically Signed   By: Nolon Nations M.D.   On: 02/26/2016 12:10   Dg Ankle Left Port  Result Date: 02/25/2016 CLINICAL DATA:  Status post ankle fusion EXAM: PORTABLE LEFT ANKLE - 2 VIEW COMPARISON:  August 10, 2012 FINDINGS: The patient is status post ankle fusion. There is resection of the distal fibula. A rod extends through the distal tibia into the calcaneus and talus. The rod is affixed with interlocking screws.  A single screw traverses the length of the calcaneus. The remainder of the calcaneal hardware has been removed. No other acute abnormalities. IMPRESSION: Fusion of the ankle as described above. Electronically Signed   By: Dorise Bullion III M.D   On: 02/25/2016 17:31   Dg Abd Portable 1v  Result  Date: 02/26/2016 CLINICAL DATA:  Abdominal distention. EXAM: PORTABLE ABDOMEN - 1 VIEW COMPARISON:  02/26/2016 FINDINGS: The bowel gas pattern is normal. No radio-opaque calculi or other significant radiographic abnormality are seen. Study quality is degraded by portable supine exam. IMPRESSION: No evidence for acute  abnormality. Electronically Signed   By: Nolon Nations M.D.   On: 02/26/2016 17:19   Dg C-arm 61-120 Min  Result Date: 02/25/2016 CLINICAL DATA:  Left tibiotalar fusion EXAM: DG C-ARM 61-120 MIN; LEFT ANKLE COMPLETE - 3+ VIEW COMPARISON:  07/27/2015 left foot CT FINDINGS: Fluoroscopy time 1 minutes 1 second. 4 nondiagnostic spot fluoroscopic intraoperative radiographs of the left ankle demonstrate postsurgical changes from intra medullary rod traversing the distal left tibia, talus and calcaneus with interlocking tibial, talar and calcaneal screws. IMPRESSION: Intraoperative fluoroscopic guidance for left tibiotalar fusion. Electronically Signed   By: Ilona Sorrel M.D.   On: 02/25/2016 13:01    Scheduled: . docusate sodium  100 mg Oral BID  . FLUoxetine  40 mg Oral Daily  . folic acid  1 mg Oral Daily  . methocarbamol  1,000 mg Oral QID   Or  . methocarbamol (ROBAXIN)  IV  500 mg Intravenous QID  . multivitamin with minerals  1 tablet Oral Daily  . polyethylene glycol  17 g Oral Daily  . thiamine  100 mg Oral Daily    LOS: 2 days   Ridhima Golberg C 02/27/2016,8:05 AM

## 2016-02-27 NOTE — Progress Notes (Signed)
ANTICOAGULATION CONSULT NOTE - Follow Up Consult  Pharmacy Consult for heparin Indication: r/o PE    Labs:  Recent Labs  02/25/16 1516  02/26/16 0951 02/26/16 1006 02/26/16 1231 02/26/16 1412 02/26/16 2001 02/27/16 0117 02/27/16 0118  HGB 12.7*  --  11.1*  --   --   --   --  10.2*  --   HCT 38.8*  --  34.5*  --   --   --   --  31.8*  --   PLT 196  --  172  --   --   --   --  171  --   HEPARINUNFRC  --   --   --   --   --   --  1.05*  --  0.77*  CREATININE 1.57*  < > 4.08* 4.19*  --   --   --   --  2.72*  CKTOTAL  --   --  2,672*  --  2,829*  --   --   --   --   CKMB  --   --   --   --  18.0*  --   --   --   --   TROPONINI  --   --   --   --   --  <0.03 0.03*  --   --   < > = values in this interval not displayed.   Assessment: 50yo male remains above goal on heparin after rate decrease though closer to goal.  Goal of Therapy:  Heparin level 0.3-0.7 units/ml   Plan:  Will decrease heparin gtt by 1-2 units/kg/hr to 1300 units/hr and check level in [place], PharmD, BCPS  02/27/2016,2:54 AM

## 2016-02-27 NOTE — Progress Notes (Signed)
Hand hygiene and peri care done before foley was removed wiith sterile gloves

## 2016-02-27 NOTE — Progress Notes (Signed)
Physical Therapy Treatment Patient Details Name: [name] MRN: [id number] DOB: 15-Sep-1965 Today's Date: 02/27/2016    History of Present Illness Pt is a 50 y/o male s/p tibiotalar fusion surgery secondary to prior history of a complex, post-traumatic L ankle fx. PMH including but not limited to ORIF for L calcaneus fx in 2014, hardware removal in 2014, HTN, ETOH and hx of cocaine abuse.  Post op developed chest pain, dyspnea, AKI.  8/11 doppler Bil LEs, no DVT.  8/12 V/Q scan, Very low probability for pulmonary embolism.     PT Comments    [name] was lethargic upon arrival and required cues for safe transfers and ambulation today, attempting to stand from bed without RW in front of him.  DOE but SpO2 93% on RA.  He ambulated 30 ft with his RW and min guard assist.  Pt will benefit from continued skilled PT services to increase functional independence and safety.   Follow Up Recommendations  Home health PT;Supervision for mobility/OOB     Equipment Recommendations  None recommended by PT    Recommendations for Other Services OT consult     Precautions / Restrictions Precautions Precautions: Fall Restrictions Weight Bearing Restrictions: Yes LLE Weight Bearing: Non weight bearing    Mobility  Bed Mobility Overal bed mobility: Needs Assistance Bed Mobility: Supine to Sit;Sit to Supine     Supine to sit: Supervision     General bed mobility comments: Required increased time to complete task   Transfers Overall transfer level: Needs assistance Equipment used: Rolling walker (2 wheeled) Transfers: Sit to/from Stand Sit to Stand: Min assist         General transfer comment: Pt attempted to stand from bed without RW in front of him and required verbal cues to return to sitting.  Educated pt on safety with transfers.  Min assist to steady due to LOB as pt c/o dizziness upon standing that clears quickly.  Ambulation/Gait Ambulation/Gait assistance: Min  guard Ambulation Distance (Feet): 30 Feet Assistive device: Rolling walker (2 wheeled) Gait Pattern/deviations:  (hop on Rt LE) Gait velocity: decreased   General Gait Details: Pt able to maintain NWB L LE status independently but cues provided for pt to take his time, especially when turning around bed.   Stairs            Wheelchair Mobility    Modified Rankin (Stroke Patients Only)       Balance Overall balance assessment: Needs assistance Sitting-balance support: No upper extremity supported;Feet supported Sitting balance-Leahy Scale: Fair     Standing balance support: Bilateral upper extremity supported;During functional activity Standing balance-Leahy Scale: Poor Standing balance comment: Relies on RW for support                    Cognition Arousal/Alertness: Lethargic Behavior During Therapy: WFL for tasks assessed/performed Overall Cognitive Status: Within Functional Limits for tasks assessed                      Exercises General Exercises - Lower Extremity Quad Sets: Strengthening;Left;5 reps;Seated Long Arc Quad: AROM;Left;10 reps;Seated    General Comments General comments (skin integrity, edema, etc.): DOE which pt says is his baseline, SpO2 93% on RA.      Pertinent Vitals/Pain Pain Assessment: 0-10 Pain Score: 10-Worst pain ever Pain Location: Lt ankle after ambulating Pain Descriptors / Indicators: Aching;Discomfort;Grimacing;Guarding Pain Intervention(s): Limited activity within patient's tolerance;Monitored during session;Repositioned    Home Living  Prior Function            PT Goals (current goals can now be found in the care plan section) Acute Rehab PT Goals Patient Stated Goal: return home PT Goal Formulation: With patient Time For Goal Achievement: 03/03/16 Potential to Achieve Goals: Good Progress towards PT goals: Progressing toward goals    Frequency  Min 5X/week    PT  Plan Frequency needs to be updated    Co-evaluation             End of Session Equipment Utilized During Treatment: Gait belt Activity Tolerance: Patient limited by pain;Patient limited by lethargy Patient left: with call bell/phone within reach;in chair;with chair alarm set;with family/visitor present;with nursing/sitter in room     Time: [id number] PT Time Calculation (min) (ACUTE ONLY): 24 min  Charges:  $Gait Training: 8-22 mins $Therapeutic Activity: 8-22 mins                    G Codes:       [name] PT, DPT  Pager: [phone] Phone: [phone] 02/27/2016, 2:30 PM

## 2016-02-28 ENCOUNTER — Inpatient Hospital Stay (HOSPITAL_COMMUNITY): Payer: 59

## 2016-02-28 DIAGNOSIS — N179 Acute kidney failure, unspecified: Secondary | ICD-10-CM | POA: Diagnosis not present

## 2016-02-28 DIAGNOSIS — T464X5A Adverse effect of angiotensin-converting-enzyme inhibitors, initial encounter: Secondary | ICD-10-CM

## 2016-02-28 DIAGNOSIS — I10 Essential (primary) hypertension: Secondary | ICD-10-CM

## 2016-02-28 DIAGNOSIS — R0600 Dyspnea, unspecified: Secondary | ICD-10-CM

## 2016-02-28 LAB — BASIC METABOLIC PANEL
Anion gap: 11 (ref 5–15)
BUN: 18 mg/dL (ref 6–20)
CO2: 24 mmol/L (ref 22–32)
Calcium: 8.8 mg/dL — ABNORMAL LOW (ref 8.9–10.3)
Chloride: 102 mmol/L (ref 101–111)
Creatinine, Ser: 1.2 mg/dL (ref 0.61–1.24)
GFR calc Af Amer: >60 mL/min (ref >60)
GFR calc non Af Amer: >60 mL/min (ref >60)
Glucose, Bld: 96 mg/dL (ref 65–99)
Potassium: 4.7 mmol/L (ref 3.5–5.1)
Sodium: 137 mmol/L (ref 135–145)

## 2016-02-28 LAB — CBC
HCT: 30.6 % — ABNORMAL LOW (ref 39.0–52.0)
Hemoglobin: 10 g/dL — ABNORMAL LOW (ref 13.0–17.0)
MCH: 29.6 pg (ref 26.0–34.0)
MCHC: 32.7 g/dL (ref 30.0–36.0)
MCV: 90.5 fL (ref 78.0–100.0)
Platelets: 190 10*3/uL (ref 150–400)
RBC: 3.38 MIL/uL — ABNORMAL LOW (ref 4.22–5.81)
RDW: 13.5 % (ref 11.5–15.5)
WBC: 7.4 10*3/uL (ref 4.0–10.5)

## 2016-02-28 LAB — ECHOCARDIOGRAM COMPLETE
Height: 72 in
Weight: 3721.6 oz

## 2016-02-28 LAB — CK TOTAL AND CKMB (NOT AT ARMC)
CK, MB: 7.4 ng/mL — ABNORMAL HIGH (ref 0.5–5.0)
Relative Index: 0.6 (ref 0.0–2.5)
Total CK: 1258 U/L — ABNORMAL HIGH (ref 49–397)

## 2016-02-28 LAB — HEPARIN LEVEL (UNFRACTIONATED): Heparin Unfractionated: 0.16 IU/mL — ABNORMAL LOW (ref 0.30–0.70)

## 2016-02-28 MED ORDER — HEPARIN BOLUS VIA INFUSION
2000.0000 [IU] | Freq: Once | INTRAVENOUS | Status: DC
  Administered 2016-02-28: 2000 [IU] via INTRAVENOUS
  Filled 2016-02-28: qty 2000

## 2016-02-28 MED ORDER — ENOXAPARIN SODIUM 60 MG/0.6ML ~~LOC~~ SOLN
50.0000 mg | SUBCUTANEOUS | Status: DC
  Administered 2016-02-28: 50 mg via SUBCUTANEOUS
  Filled 2016-02-28 (×3): qty 0.6

## 2016-02-28 MED ORDER — WHITE PETROLATUM GEL
Status: AC
  Administered 2016-02-28: 15:00:00
  Filled 2016-02-28: qty 1

## 2016-02-28 NOTE — Progress Notes (Signed)
PULMONARY / CRITICAL CARE MEDICINE   Name: [name] MRN: [id number] DOB: [birth year]    ADMISSION DATE:  02/25/2016 CONSULTATION DATE:  8/11  REFERRING MD:  Marcelino Scot  CHIEF COMPLAINT:  Renal failure and chest pain   SUBJECTIVE:  No sob/ cough  RA   VITAL SIGNS: BP 138/85 (BP Location: Left Arm)   Pulse 93   Temp 98 F (36.7 C)   Resp 18   Ht 6' (1.829 m)   Wt 232 lb 9.6 oz (105.5 kg)   SpO2 97%   BMI 31.55 kg/m   FIO2  RA  INTAKE / OUTPUT: I/O last 3 completed shifts: In: [id number] [P.O.:920; I.V.:4451] Out: 3950 [Urine:3950]  PHYSICAL EXAMINATION: General: sitting in chair Neuro: alert/ approp HEENT: no stridor Cardiac: regular, no murmur Chest: no wheeze Abd: soft, non tender Ext: Lt leg in wrap Skin: no rashes  LABS:  BMET  Recent Labs Lab 02/26/16 1006 02/27/16 0118 02/28/16 0504  NA 134* 135 137  K 4.5 4.3 4.7  CL 101 105 102  CO2 22 20* 24  BUN 41* 41* 18  CREATININE 4.19* 2.72* 1.20  GLUCOSE 108* 89 96    Electrolytes  Recent Labs Lab 02/26/16 0951 02/26/16 1006 02/27/16 0118 02/28/16 0504  CALCIUM 8.0* 8.0* 7.8* 8.8*  MG 2.0  --   --   --   PHOS 7.5* 7.5* 4.1  --     CBC  Recent Labs Lab 02/26/16 0951 02/27/16 0117 02/28/16 0504  WBC 9.7 7.9 7.4  HGB 11.1* 10.2* 10.0*  HCT 34.5* 31.8* 30.6*  PLT 172 171 190    Coag's No results for input(s): APTT, INR in the last 168 hours.  Sepsis Markers  Recent Labs Lab 02/26/16 1412 02/26/16 1440  LATICACIDVEN 1.5 1.4    ABG  Recent Labs Lab 02/26/16 1245  PHART 7.330*  PCO2ART 38.9  PO2ART 81.8    Liver Enzymes  Recent Labs Lab 02/26/16 0951 02/26/16 1006 02/27/16 0118  AST 38  --   --   ALT 36  --   --   ALKPHOS 74  --   --   BILITOT 0.7  --   --   ALBUMIN 3.5 3.5 3.2*    Cardiac Enzymes  Recent Labs Lab 02/26/16 1412 02/26/16 2001 02/27/16 0117  TROPONINI <0.03 0.03* <0.03    Glucose No results for input(s): GLUCAP in the last 168  hours.  Imaging Dg Chest 2 View  Result Date: 02/27/2016 CLINICAL DATA:  Pt states that he was experiencing mild chest pain yesterday but is no longer having the pain this am. No new chest complaints per pt. EXAM: CHEST  2 VIEW COMPARISON:  02/26/2016 FINDINGS: Right perihilar and mild lung base atelectasis. Lungs otherwise clear. No pleural effusion or pneumothorax. Cardiac silhouette is normal in size. No mediastinal or hilar masses or evidence of adenopathy. Skeletal structures are intact. IMPRESSION: No acute cardiopulmonary disease. Electronically Signed   By: Lajean Manes M.D.   On: 02/27/2016 09:53   Nm Pulmonary Perf And Vent  Result Date: 02/27/2016 CLINICAL DATA:  Shortness of breath, chest pain, jaw pain, post foot surgery on 02/25/2016, respiratory failure, history hypertension, pneumonia, GERD, former smoker EXAM: NUCLEAR MEDICINE VENTILATION - PERFUSION LUNG SCAN TECHNIQUE: Ventilation images were obtained in multiple projections using inhaled aerosol Tc-48m DTPA. Perfusion images were obtained in multiple projections after intravenous injection of Tc-43m MAA. RADIOPHARMACEUTICALS:  31.5 mCi Technetium-77m DTPA aerosol inhalation and 4.3 mCi Technetium-90m MAA IV COMPARISON:  Chest radiograph 02/27/2016 FINDINGS: Ventilation: Central airway deposition of aerosol. Minimal diminished peripheral ventilation question related to COPD. No focal segmental or subsegmental ventilatory defects. Perfusion: Single tiny focus of diminished subsegmental perfusion lateral RIGHT mid lung and minor fissure. Remaining perfusion normal. IMPRESSION: Very low probability for pulmonary embolism. Electronically Signed   By: Lavonia Dana M.D.   On: 02/27/2016 12:38    STUDIES:  8/11 Doppler legs b/l >> no DVT 8/12 Echo >>   8/12 V/Q scan > very low prob PE   SIGNIFICANT EVENTS:   DISCUSSION: 50 yo male with complex Lt calcaneus fx in December 2014 with progressive ankle pain.  He had fusion 8/10.  Post op  developed chest pain, dyspnea, and AKI on ACEi preop  ASSESSMENT / PLAN:  Chest pain with no evidence MI/ PE / resolved  Hx of HTN. - f/u Echo  Still pending  - off IV hep am 8/13 > lovenox    AKI likely from volume depletion and ACE inhibitor use. - resolved as of 02/28/16 > would avoid acei permanently in this setting   Hx of OSA. - CPAP qhs  Hx of GERD. Nutrition. - advance diet  Anemia after surgery. - f/u CBC > appears to have reached plateau 8/13 but still  Need to monitor on lovenox   S/p Lt ankle fusion 8/10. - post-op care per ortho  Hx of cocaine, ETOH abuse. Hx of depression. - monitor mental status - continue prozac, thiamine, folic acid, MVI - CIWA protocol     Christinia Gully, MD [facility] [place] [phone] After 5:30 PM or weekends, call [phone]

## 2016-02-28 NOTE — Progress Notes (Signed)
Pt is able to turn  by himself no need for Q2 turns WILL CONTINUE TO MONITOR

## 2016-02-28 NOTE — Progress Notes (Signed)
[facility] Progress Note  Subjective  Doing much better No CP, no abd pain Tolerated breakfast this am (sausage, french toast) Sitting on EOB this am   Still with fairly moderate pain in L ankle   No BM since day of surgery + flatus   VQ scan low probability of PE Heparin gtt has been stopped   Review of Systems  Constitutional: Negative for chills and fever.  Respiratory: Negative for shortness of breath and wheezing.   Cardiovascular: Negative for chest pain and palpitations.  Gastrointestinal: Negative for abdominal pain, nausea and vomiting.  Genitourinary: Negative for dysuria and urgency.  Neurological: Negative for tingling, sensory change and headaches.     Objective   BP 138/85 (BP Location: Left Arm)   Pulse 93   Temp 98 F (36.7 C)   Resp 18   Ht 6' (1.829 m)   Wt 105.5 kg (232 lb 9.6 oz)   SpO2 97%   BMI 31.55 kg/m   Intake/Output      08/12 0701 - 08/13 0700 08/13 0701 - 08/14 0700   P.O. 920    I.V. (mL/kg) 2778.1 (26.3)    Total Intake(mL/kg) 3698.1 (35.1)    Urine (mL/kg/hr) 1850 (0.7)    Total Output 1850     Net +1848.1          Urine Occurrence 1 x      Labs  Results for DOCTOR, SHEAHAN (MRN [id number]) as of 02/28/2016 10:09  Ref. Range 02/28/2016 05:04  Sodium Latest Ref Range: 135 - 145 mmol/L 137  Potassium Latest Ref Range: 3.5 - 5.1 mmol/L 4.7  Chloride Latest Ref Range: 101 - 111 mmol/L 102  CO2 Latest Ref Range: 22 - 32 mmol/L 24  BUN Latest Ref Range: 6 - 20 mg/dL 18  Creatinine Latest Ref Range: 0.61 - 1.24 mg/dL 1.20  Calcium Latest Ref Range: 8.9 - 10.3 mg/dL 8.8 (L)  EGFR (Non-African Amer.) Latest Ref Range: >60 mL/min >60  EGFR (African American) Latest Ref Range: >60 mL/min >60  Glucose Latest Ref Range: 65 - 99 mg/dL 96  Anion gap Latest Ref Range: 5 - 15  11  CK Total Latest Ref Range: 49 - 397 U/L 1,258 (H)  CK, MB Latest Ref Range: 0.5 - 5.0 ng/mL 7.4 (H)  WBC Latest Ref Range: 4.0 - 10.5 K/uL 7.4   RBC Latest Ref Range: 4.22 - 5.81 MIL/uL 3.38 (L)  Hemoglobin Latest Ref Range: 13.0 - 17.0 g/dL 10.0 (L)  HCT Latest Ref Range: 39.0 - 52.0 % 30.6 (L)  MCV Latest Ref Range: 78.0 - 100.0 fL 90.5  MCH Latest Ref Range: 26.0 - 34.0 pg 29.6  MCHC Latest Ref Range: 30.0 - 36.0 g/dL 32.7  RDW Latest Ref Range: 11.5 - 15.5 % 13.5  Platelets Latest Ref Range: 150 - 400 K/uL 190  Heparin Unfractionated Latest Ref Range: 0.30 - 0.70 IU/mL 0.16 (L)    Exam  Gen: sitting up on EOB, NAD Lungs: dec at bases but o/w clear Cardiac: s1 and s2, RRR Abd: +BS, NTND Ext:    Left Lower Extremity                        Dressing stable                        DPN, SPN, TN sensation grossly intact  Ext warm                       Brisk cap refill                       + DP pulse                       EHL, FHL, lesser toe motor functions intact                                                Assessment and Plan   POD/HD#: 44  50 y/o male s/p L tibiotalar fusion due to post-traumatic L ankle arthritis      -L ankle arthritis s/p tibiotalar fusion                        NWB L Lex x 8 weeks                       Continue with current dressing and reinforce as needed                                             Not surprised with the amount of bleeding given the procedure performed                       Ok to work with PT/OT                       Ice and elevate leg above heart when not working with PT/OT                       Will remove splint in 2 weeks and convert to [facility]    - acute renal failure, acute chest pain- resolving                                          renal function drastically improved          VQ scan low prob PE          Echo pending          Cardiac markers do not favor MI            ? Related to lisinopril use pre-op- CCM recommends permanent discontinuation of this medication      - Pain management:                       continue with current regimen       - ABL anemia/Hemodynamics                       improved                        stable    - Medical issues  Sleep apnea                                             CPAP                         HTN                                           management per CCM   - DVT/PE prophylaxis:                       now on prophylactic lovenox    - ID:                        Completed periop clindamycin                        No further antibiosis    - Activity:                       NWB Left Lower extremity                        PT/OT as tolerated for now    - FEN/GI prophylaxis/Foley/Lines:                      reg diet    -Ex-fix/Splint care:                       Continue with current splint                       Reinforce as needed   - Dispo:                      continue with inpatient management            Resume therapies   Consider dc home tomorrow              Follow up with PCP and cardiologist as [place], PA-C [facility] [phone] (P) [phone] (O) 02/28/2016 9:59 AM

## 2016-02-28 NOTE — Progress Notes (Signed)
Patient placed on CPAP auto for the night and tolerating well. Will call if any more assistance needed throughout the night.

## 2016-02-28 NOTE — Progress Notes (Addendum)
   ANTICOAGULATION CONSULT NOTE - Follow Up Consult  Pharmacy Consult for heparin change to Lovenox ppx Indication: r/o PE - negative and now just DVT ppx  Patient Measurements: Weight: 238 lb (108 kg) Heparin Dosing Weight: 100.3  Labs:  Recent Labs  02/26/16 0951 02/26/16 1006 02/26/16 1231 02/26/16 1412  02/26/16 2001 02/27/16 0117 02/27/16 0118 02/27/16 1049 02/28/16 0504  HGB 11.1*  --   --   --   --   --  10.2*  --   --  10.0*  HCT 34.5*  --   --   --   --   --  31.8*  --   --  30.6*  PLT 172  --   --   --   --   --  171  --   --  190  HEPARINUNFRC  --   --   --   --   < > 1.05*  --  0.77* 0.43 0.16*  CREATININE 4.08* 4.19*  --   --   --   --   --  2.72*  --   --   CKTOTAL 2,672*  --  2,829*  --   --   --   --   --   --   --   CKMB  --   --  18.0*  --   --   --   --   --   --   --   TROPONINI  --   --   --  <0.03  --  0.03* <0.03  --   --   --   < > = values in this interval not displayed.   Assessment: 50yo male on IV heparin for PE. Heparin level dropped to 0.16 today on 1200 units/hr.  CBC stable. No bleeding noted.   LE Dopplers and VQ scan negative >> no longer needs therapeutic heparin.   Goal of Therapy:  Heparin level 0.3-0.7 units/ml   Plan:  Discussed therapy with Dr. Halford Chessman- will discontinue IV heparin and start DVT ppx with Lovenox.  Discontinue heparin. After 1 hour, will start Lovenox 0.5 mg/kg (50mg ) SQ every 24 hours for DVT ppx. *Higher dose for BMI >30.  Pharmacy will sign off, please re-consult if needed.   Sloan Leiter, PharmD, BCPS Clinical Pharmacist [phone] 02/28/2016,7:38 AM

## 2016-02-28 NOTE — Progress Notes (Signed)
  Echocardiogram 2D Echocardiogram has been performed.  [name] 02/28/2016, 11:03 AM

## 2016-02-29 DIAGNOSIS — R109 Unspecified abdominal pain: Secondary | ICD-10-CM | POA: Diagnosis not present

## 2016-02-29 LAB — CBC
HCT: 32.2 % — ABNORMAL LOW (ref 39.0–52.0)
Hemoglobin: 10.6 g/dL — ABNORMAL LOW (ref 13.0–17.0)
MCH: 29.5 pg (ref 26.0–34.0)
MCHC: 32.9 g/dL (ref 30.0–36.0)
MCV: 89.7 fL (ref 78.0–100.0)
Platelets: 189 10*3/uL (ref 150–400)
RBC: 3.59 MIL/uL — ABNORMAL LOW (ref 4.22–5.81)
RDW: 13.1 % (ref 11.5–15.5)
WBC: 5.8 10*3/uL (ref 4.0–10.5)

## 2016-02-29 LAB — MYOGLOBIN, URINE: Myoglobin, Ur: <2 ng/mL (ref 0–13)

## 2016-02-29 MED ORDER — METHOCARBAMOL 500 MG PO TABS: 500.0000 mg | ORAL_TABLET | Freq: Four times a day (QID) | ORAL | 0 refills | Status: DC | PRN

## 2016-02-29 MED ORDER — HYDROCODONE-ACETAMINOPHEN 7.5-325 MG PO TABS: 1.0000 | ORAL_TABLET | Freq: Four times a day (QID) | ORAL | 0 refills | Status: DC | PRN

## 2016-02-29 MED ORDER — DOCUSATE SODIUM 100 MG PO CAPS: 100.0000 mg | ORAL_CAPSULE | Freq: Two times a day (BID) | ORAL | 0 refills | Status: DC

## 2016-02-29 MED ORDER — ENOXAPARIN (LOVENOX) PATIENT EDUCATION KIT
PACK | Freq: Once | Status: AC
  Administered 2016-02-29: 14:00:00
  Filled 2016-02-29: qty 1

## 2016-02-29 MED ORDER — ENOXAPARIN SODIUM 100 MG/ML ~~LOC~~ SOLN: 50.0000 mg | SUBCUTANEOUS | 0 refills | Status: DC

## 2016-02-29 MED ORDER — OXYCODONE HCL 5 MG PO TABS: 5.0000 mg | ORAL_TABLET | Freq: Four times a day (QID) | ORAL | 0 refills | Status: DC | PRN

## 2016-02-29 NOTE — Discharge Summary (Signed)
Orthopaedic Trauma Service (OTS)  Patient ID: [name] MRN: [id number] DOB/AGE: 1966-07-13 50 y.o.  Admit date: 02/25/2016 Discharge date: 02/29/2016  Admission Diagnoses: Posttraumatic arthritis left ankle Major depressive disorder OSA Hypertension  Discharge Diagnoses:  Principal Problem:   Post-traumatic arthritis of left ankle Active Problems:   Major depressive disorder, recurrent episode, moderate (HCC)   Chest pain, rule out acute myocardial infarction- resolved   Obstructive sleep apnea   HTN (hypertension), benign   Acute renal failure due to angiotensin converting enzyme (ACE) inhibitor ([place])- resolved   Acute abdominal pain- resolved   Procedures Performed: 02/25/2016- Dr. Marcelino Scot 1. Tibiotalar fusion, left ankle joint using Biomet fusion nail 10 x     180 compressed and statically locked. 2. Fibular osteotomy.   Discharged Condition: stable  Hospital Course:   A 50 year old white male well-known to the orthopedic trauma service admitted on 02/25/2016 for left ankle fusion. Patient has a history of a left calcaneus fracture with nonunion and subsequent posttraumatic left subtalar arthritis status post fusion. Patient developed unrelenting left ankle pain. He did respond to conservative measures for a temporary period of time but his progress to end-stage post traumatic arthritis in his left ankle. Patient finally decided to proceed with surgical intervention. This particular procedure and and scheduled to previous times however been canceled for numerous medical reasons including uncontrolled hypertension as well as a positive tox screen for cocaine. Patient's most recent toxicology screen is been negative and states he hasn't used cocaine since May. His blood pressure were under good control. Patient had also been cleared by cardiology (Dr. [name]) [facility]. Patient had a echo, which did not demonstrate acute concerns and a nuclear stress test which did not  demonstrate any significant ischemia. As such we decided to proceed with left ankle fusion. Patient was taken to the OR on 02/25/2016 for the procedure noted above. Patient tolerated the procedure well. After surgery he was transferred to the PACU for recovery from anesthesia and then was transferred to the orthopedic floor for observation, pain control and to begin therapies. Patient's blood pressures were doing well intraoperatively as well as in the PACU. His antihypertensive medications were held in the postoperative period.  On postoperative day #1 patient did not appear well. He was having some abdominal pain. On exam his abdomen was quite tense and was having some diffuse comfort with his quadrants. He did have some diminished breath sounds at his bases. Of additional concern he did have a bump in his creatinine from 1.0 on 02/23/2016 to 3.37 on 02/26/2016. A repeat semen was notable for a creatinine of 4.08. Additional labs were ordered cleaning LFTs which did not demonstrate any acute findings. He did have an elevated phosphorus of 7.5. I did order an acute abdominal series. As well as urine studies. CK was also ordered which was notable for a level of 2672.His hemoglobin and hematocrit remained stable. WBC count was also stable.   Patient went down to radiology to have his acute abdominal series. About halfway through patient began to experience severe chest pain. Rapid response RN was contacted and assisted in management of the patient. I was also present in the radiology department during his episode of acute chest pain. Numerous interventions were undertaken including aggressive fluid hydration. Patient had a Foley placed to monitor his urine output. Patient also did end up receiving 2 tabs of nitroglycerin in the radiology suite which he did respond favorably to. His imaging studies  were unremarkable once again (abdomen and chest). Repeat CK and CK-MB were obtained. These demonstrated also 2829 and  18.0, respectively. Troponins were also ordered. Only the second troponin was within the positive range at 0.03 the first and third were less than 0.03. During his evaluation in the radiology department critical her medicine and nephrology were consulted. Patient was then transferred to the heart ICU for close monitoring. Patient did receive another dose of nitroglycerin once he arrived at the ICU. Here in the critical care service did extensive workup for evaluation of possible MI or pulmonary embolus. Patient was started on heparin drip for presumptive PE. We did monitor him closely for possible concerns of her intra-abdominal pathology as well. Patient's lactic acid was normal at 1.4 during this whole episode as well. Ultimately a VQ scan was obtained which was low probability for pulmonary embolus. DVT studies were also performed which were negative. Echo was performed which was essentially the same as his previous study.  Continued with aggressive hydration and supportive care. On postoperative day #2 patient's BUN and creatinine responded favorably and began to trend down. As noted above all of his [name] laboratory markers were really unremarkable. Patient was transferred from ICU to telemetry (5W) on the afternoon of postoperative day #2. The patient continued to progress well from this. He resumed therapies. Pain was well-controlled and his left ankle. Once his VQ scan was noted in the low probability his heparin drip was stopped and he was started on prophylactic dose of Lovenox. On postoperative day #4 patient was deemed to be stable for discharge to home. Critical care felt that likely causative agent was lisinopril leading to his chest pain as well as his acute renal failure even though patient did not have lisinopril resumed postoperatively. They recommend permanent discontinuation of this medication. At discharge patient will only be discharged on his Norvasc for blood pressure control. He will  review with his PCP/cardiology further medication interventions.  Consults: pulmonary/intensive care and nephrology  Significant Diagnostic Studies: labs:  Results for [name], [name] (MRN [id number]) as of 02/29/2016 11:13  Ref. Range 02/26/2016 12:31 02/28/2016 05:04  CK Total Latest Ref Range: 49 - 397 U/L 2,829 (H) 1,258 (H)  CK, MB Latest Ref Range: 0.5 - 5.0 ng/mL 18.0 (H) 7.4 (H)    Results for [name], [name] (MRN [id number]) as of 02/29/2016 11:13  Ref. Range 02/26/2016 14:12 02/26/2016 20:01 02/27/2016 01:17  Troponin I Latest Ref Range: <0.03 ng/mL <0.03 0.03 [facility]) <0.03  Results for [name], [name] (MRN [id number]) as of 02/29/2016 11:13  Ref. Range 02/28/2016 05:04  Sodium Latest Ref Range: 135 - 145 mmol/L 137  Potassium Latest Ref Range: 3.5 - 5.1 mmol/L 4.7  Chloride Latest Ref Range: 101 - 111 mmol/L 102  CO2 Latest Ref Range: 22 - 32 mmol/L 24  BUN Latest Ref Range: 6 - 20 mg/dL 18  Creatinine Latest Ref Range: 0.61 - 1.24 mg/dL 1.20  Calcium Latest Ref Range: 8.9 - 10.3 mg/dL 8.8 (L)  EGFR (Non-African Amer.) Latest Ref Range: >60 mL/min >60  EGFR (African American) Latest Ref Range: >60 mL/min >60  Glucose Latest Ref Range: 65 - 99 mg/dL 96  Anion gap Latest Ref Range: 5 - 15  11  Results for [name], [name] (MRN [id number]) as of 02/29/2016 11:13  Ref. Range 02/29/2016 07:08  WBC Latest Ref Range: 4.0 - 10.5 K/uL 5.8  RBC Latest Ref Range: 4.22 - 5.81 MIL/uL 3.59 (L)  Hemoglobin Latest Ref Range:  13.0 - 17.0 g/dL 10.6 (L)  HCT Latest Ref Range: 39.0 - 52.0 % 32.2 (L)  MCV Latest Ref Range: 78.0 - 100.0 fL 89.7  MCH Latest Ref Range: 26.0 - 34.0 pg 29.5  MCHC Latest Ref Range: 30.0 - 36.0 g/dL 32.9  RDW Latest Ref Range: 11.5 - 15.5 % 13.1  Platelets Latest Ref Range: 150 - 400 K/uL 189    Results for [name], [name] (MRN [id number]) as of 02/29/2016 11:13  Ref. Range 02/26/2016 14:12 02/26/2016 14:40  Lactic Acid, Venous Latest Ref Range: 0.5 - 1.9 mmol/L 1.5 1.4  Results for [name], [name] (MRN [id number]) as of 02/29/2016 11:13  Ref. Range 02/26/2016 09:51  Magnesium Latest Ref Range: 1.7 - 2.4 mg/dL 2.0  Alkaline Phosphatase Latest Ref Range: 38 - 126 U/L 74  Albumin Latest Ref Range: 3.5 - 5.0 g/dL 3.5  Amylase, Serum Latest Ref Range: 28 - 100 U/L 58  Lipase Latest Ref Range: 11 - 51 U/L 20  AST Latest Ref Range: 15 - 41 U/L 38  ALT Latest Ref Range: 17 - 63 U/L 36  Total Protein Latest Ref Range: 6.5 - 8.1 g/dL 5.7 (L)  Bilirubin, Direct Latest Ref Range: 0.1 - 0.5 mg/dL 0.1  Indirect Bilirubin Latest Ref Range: 0.3 - 0.9 mg/dL 0.6  Total Bilirubin Latest Ref Range: 0.3 - 1.2 mg/dL 0.7  Results for [name], [name] (MRN [id number]) as of 02/29/2016 11:13  Ref. Range 02/27/2016 01:18  Phosphorus Latest Ref Range: 2.5 - 4.6 mg/dL 4.1  Results for [name], [name] (MRN [id number]) as of 02/29/2016 11:13  Ref. Range 02/26/2016 13:16 02/26/2016 13:30  Appearance Latest Ref Range: CLEAR  CLEAR   Bilirubin Urine Latest Ref Range: NEGATIVE  NEGATIVE   Color, Urine Latest Ref Range: YELLOW  YELLOW   Glucose Latest Ref Range: NEGATIVE mg/dL NEGATIVE   Hgb urine dipstick Latest Ref Range: NEGATIVE  NEGATIVE   Ketones, ur Latest Ref Range: NEGATIVE mg/dL NEGATIVE   Leukocytes, UA Latest Ref Range: NEGATIVE  NEGATIVE   Nitrite Latest Ref Range: NEGATIVE  NEGATIVE   pH Latest Ref Range: 5.0 - 8.0  5.0   Protein Latest Ref Range: NEGATIVE mg/dL NEGATIVE   Specific Gravity, Urine Latest Ref Range: 1.005 - 1.030  1.025   Osmolality, Urine Latest Ref Range: 300 - 900 mOsm/kg  552  Sodium, Urine Latest Units: mmol/L 99   Creatinine, Urine Latest Units: mg/dL 122.93       nuclear medicine:   VQ scan of the lungs: Low probability of pulmonary embolus   Treatments: IV hydration, antibiotics: Clindamycin, analgesia: Norco, OxyIR, Dilaudid, anticoagulation: Lovenox followed by heparin drip then place back on Lovenox, respiratory therapy: CPAP and surgery: As above  Discharge Exam:                 Orthopaedic Trauma Service Progress Note   Subjective   Doing great Wants to go home   No CP, no SOB No abd pain    No [name] complaints    Tolerating diet Working with therapy    Creatinine normalized    Review of Systems  Constitutional: Negative for fever.  Eyes: Negative for blurred vision and double vision.  Respiratory: Negative for shortness of breath and wheezing.   Cardiovascular: Negative for chest pain and palpitations.  Gastrointestinal: Negative for abdominal pain, nausea and vomiting.  Genitourinary: Negative for dysuria.  Neurological: Negative for tingling, sensory change and headaches.  Objective    BP (!) 148/95 (BP Location: Left Arm)   Pulse 81   Temp 97.6 F (36.4 C)   Resp 18   Ht 6' (1.829 m)   Wt 105.5 kg (232 lb 9.6 oz)   SpO2 99%   BMI 31.55 kg/m    Intake/Output      08/13 0701 - 08/14 0700 08/14 0701 - 08/15 0700   P.O. 760    I.V. (mL/kg) 1418.8 (13.4)    IV Piggyback 55    Total Intake(mL/kg) 2233.8 (21.2)    Urine (mL/kg/hr) 1550 (0.6)    Total Output 1550     Net +683.8             Labs   Results for DIONIS, AUTRY (MRN [id number]) as of 02/29/2016 10:30   Ref. Range 02/29/2016 07:08  WBC Latest Ref Range: 4.0 - 10.5 K/uL 5.8  RBC Latest Ref Range: 4.22 - 5.81 MIL/uL 3.59 (L)  Hemoglobin Latest Ref Range: 13.0 - 17.0 g/dL 10.6 (L)  HCT Latest Ref Range: 39.0 - 52.0 % 32.2 (L)  MCV Latest Ref Range: 78.0 - 100.0 fL 89.7  MCH Latest Ref Range: 26.0 - 34.0 pg 29.5  MCHC Latest Ref Range: 30.0 - 36.0 g/dL 32.9  RDW Latest Ref Range: 11.5 - 15.5 % 13.1  Platelets Latest Ref Range: 150 - 400 K/uL 189      Exam   Gen: awake and alert, sitting on EOB, NAD, working with therapy  Lungs:  No wheezing  Cardiac: RRR, s1 and s2 Abd: + BS, NTND Ext:       Left Lower Extremity                        Dressing stable                        DPN, SPN, TN sensation grossly intact                       Ext warm                        Brisk cap refill                       + DP pulse                       EHL, FHL, lesser toe motor functions intact     Assessment and Plan    POD/HD#: 10   50 y/o male s/p L tibiotalar fusion due to post-traumatic L ankle arthritis      -L ankle arthritis s/p tibiotalar fusion                        NWB L Lex x 8 week, maintain short leg splint                        continue to ice and elevate                       Will remove splint in 2 weeks and convert to [facility]    - acute renal failure, acute chest pain and abdominal pain- resolved  renal function normalized                                           VQ scan low prob PE                                           Echo essentially normal                                            Cardiac markers do not favor MI- 1 out of 3 was positive                                              ? Related to lisinopril use pre-op- CCM recommends permanent discontinuation of this medication      - Pain management:                       continue with current regimen      - ABL anemia/Hemodynamics                       improved                        stable    - Medical issues                        Sleep apnea                                             CPAP                         HTN                                          dc home on norvasc only                                                                        Pt to discuss with PCP    - DVT/PE prophylaxis:                       now on prophylactic lovenox                                              Will dc with  14 days of lovenox    - ID:                        Completed periop clindamycin                        No further antibiosis    - Activity:                       NWB Left Lower extremity                        PT/OT as tolerated for now    - FEN/GI prophylaxis/Foley/Lines:                      reg diet     -Ex-fix/Splint care:                       Continue with current splint                       Reinforce as needed   - Dispo:                      dc home today                                             Follow up with ortho in 2 weeks                                             Follow up with pcp and cards in 1 week- discuss possible medication changes and recent hospitalization      Disposition: 01-Home or Self Care  Discharge Instructions    Call MD / Call 911    Complete by:  As directed   If you experience chest pain or shortness of breath, CALL 911 and be transported to the hospital emergency room.  If you develope a fever above 101 F, pus (white drainage) or increased drainage or redness at the wound, or calf pain, call your surgeon's office.   Constipation Prevention    Complete by:  As directed   Drink plenty of fluids.  Prune juice may be helpful.  You may use a stool softener, such as Colace (over the counter) 100 mg twice a day.  Use MiraLax (over the counter) for constipation as needed.   Diet - low sodium heart healthy    Complete by:  As directed   Discharge instructions    Complete by:  As directed   Orthopaedic Trauma Service Discharge Instructions   General Discharge Instructions  WEIGHT BEARING STATUS: Nonweight bearing left leg  RANGE OF MOTION/ACTIVITY: no range of motion L foot, ok to move left knee and hip  Wound Care: keep splint clean and dry, do not remove splint. We will remove splint at first follow up   [place] have likely been given narcotic medications to help control your pain.  After a traumatic event that results in an fracture (broken bone) with or without surgery, it is ok to use  narcotic pain medications to help control one's pain.  We understand that everyone responds to pain differently and each individual patient will be evaluated on a regular basis for the continued need for narcotic medications. Ideally,  narcotic medication use should last no more than 6-8 weeks (coinciding with fracture healing).   As a patient it is your responsibility as well to monitor narcotic medication use and report the amount and frequency you use these medications when you come to your office visit.   We would also advise that if you are using narcotic medications, you should take a dose prior to therapy to maximize you participation.  IF YOU ARE ON NARCOTIC MEDICATIONS IT IS NOT PERMISSIBLE TO OPERATE A MOTOR VEHICLE (MOTORCYCLE/CAR/TRUCK/MOPED) OR HEAVY MACHINERY DO NOT MIX NARCOTICS WITH [name] CNS (CENTRAL NERVOUS SYSTEM) DEPRESSANTS SUCH AS ALCOHOL  Diet: as you were eating previously.  Can use over the counter stool softeners and bowel preparations, such as Miralax, to help with bowel movements.  Narcotics can be constipating.  Be sure to drink plenty of fluids    STOP SMOKING OR USING NICOTINE PRODUCTS!!!!  As discussed nicotine severely impairs your body's ability to heal surgical and traumatic wounds but also impairs bone healing.  Wounds and bone heal by forming microscopic blood vessels (angiogenesis) and nicotine is a vasoconstrictor (essentially, shrinks blood vessels).  Therefore, if vasoconstriction occurs to these microscopic blood vessels they essentially disappear and are unable to deliver necessary nutrients to the healing tissue.  This is one modifiable factor that you can do to dramatically increase your chances of healing your injury.    (This means no smoking, no nicotine gum, patches, etc)  DO NOT USE NONSTEROIDAL ANTI-INFLAMMATORY DRUGS (NSAID'S)  Using products such as Advil (ibuprofen), Aleve (naproxen), Motrin (ibuprofen) for additional pain control during fracture healing can delay and/or prevent the healing response.  If you would like to take over the counter (OTC) medication, Tylenol (acetaminophen) is ok.  However, some narcotic medications that are given for pain control contain  acetaminophen as well. Therefore, you should not exceed more than 4000 mg of tylenol in a day if you do not have liver disease.  Also note that there are may OTC medicines, such as cold medicines and allergy medicines that my contain tylenol as well.  If you have any questions about medications and/or interactions please ask your doctor/PA or your pharmacist.      ICE AND ELEVATE INJURED/OPERATIVE EXTREMITY  Using ice and elevating the injured extremity above your heart can help with swelling and pain control.  Icing in a pulsatile fashion, such as 20 minutes on and 20 minutes off, can be followed.    Do not place ice directly on skin. Make sure there is a barrier between to skin and the ice pack.    Using frozen items such as frozen peas works well as the conform nicely to the are that needs to be iced.  USE AN ACE WRAP OR TED HOSE FOR SWELLING CONTROL  In addition to icing and elevation, Ace wraps or TED hose are used to help limit and resolve swelling.  It is recommended to use Ace wraps or TED hose until you are informed to stop.    When using Ace Wraps start the wrapping distally (farthest away from the body) and wrap proximally (closer to the body)   Example: If you had surgery on your leg or thing and you do not have a splint on, start the ace wrap at  the toes and work your way up to the thigh        If you had surgery on your upper extremity and do not have a splint on, start the ace wrap at your fingers and work your way up to the upper arm  IF YOU ARE IN A SPLINT OR CAST DO NOT [place]   If your splint gets wet for any reason please contact the office immediately. You may shower in your splint or cast as long as you keep it dry.  This can be done by wrapping in a cast cover or garbage back (or similar)  Do Not stick any thing down your splint or cast such as pencils, money, or hangers to try and scratch yourself with.  If you feel itchy take benadryl as prescribed on the  bottle for itching  IF YOU ARE IN A CAM BOOT (BLACK BOOT)  You may remove boot periodically. Perform daily dressing changes as noted below.  Wash the liner of the boot regularly and wear a sock when wearing the boot. It is recommended that you sleep in the boot until told otherwise  CALL THE OFFICE WITH ANY QUESTIONS OR CONCERNS: [phone]   Driving restrictions    Complete by:  As directed   No driving   Increase activity slowly as tolerated    Complete by:  As directed   Non weight bearing    Complete by:  As directed   Laterality:  left   Extremity:  Lower       Medication List    STOP taking these medications   CHANTIX 0.5 MG tablet Generic drug:  varenicline   lisinopril-hydrochlorothiazide 20-12.5 MG tablet Commonly known as:  PRINZIDE,ZESTORETIC     TAKE these medications   amLODipine 5 MG tablet Commonly known as:  NORVASC Take 1 tablet (5 mg total) by mouth daily.   aspirin 81 MG tablet Take 81 mg by mouth daily.   docusate sodium 100 MG capsule Commonly known as:  COLACE Take 1 capsule (100 mg total) by mouth 2 (two) times daily.   enoxaparin 100 MG/ML injection Commonly known as:  LOVENOX Inject 0.5 mLs (50 mg total) into the skin daily.   EPIPEN 2-PAK 0.3 mg/0.3 mL Soaj injection Generic drug:  EPINEPHrine INJECT CONTENTS OF 1 SYRINGE INTO THE MUSCLE AS DIRECTED BY YOUR PHYSICIAN FOR ALLERGIC/ANAPHYLACTIC REACTION   FLUoxetine 40 MG capsule Commonly known as:  PROZAC Take 1 capsule (40 mg total) by mouth daily.   HYDROcodone-acetaminophen 7.5-325 MG tablet Commonly known as:  NORCO Take 1-2 tablets by mouth every 6 (six) hours as needed for moderate pain or severe pain.   methocarbamol 500 MG tablet Commonly known as:  ROBAXIN Take 1-2 tablets (500-1,000 mg total) by mouth every 6 (six) hours as needed for muscle spasms.   multivitamin with minerals tablet Take 1 tablet by mouth daily.   oxyCODONE 5 MG immediate release tablet Commonly known  as:  Oxy IR/ROXICODONE Take 1-2 tablets (5-10 mg total) by mouth every 6 (six) hours as needed for breakthrough pain (take between hydrocodone for breakthrough pain).   VITAMIN C PO Take 1 tablet by mouth daily.      Follow-up Cyrus, MD. Schedule an appointment as soon as possible for a visit in 1 week(s).   Specialty:  Family Medicine Why:  review events of recent hospitalization and review medications for hypertension  Contact information: [address]  [address] [place] [address] [phone]        Rozanna Box, MD. Schedule an appointment as soon as possible for a visit in 2 week(s).   Specialty:  Orthopedic Surgery Why:  for splint removal, follow up xrays and suture removal  Contact information: [address] [place]  [address] [phone]        Wende Bushy, MD. Schedule an appointment as soon as possible for a visit in 2 week(s).   Specialty:  Cardiology Why:  f/u on recent events of hospitalization  Contact information: [place] [address] [phone]           Discharge Instructions and Plan:  50 y/o male s/p L tibiotalar fusion due to post-traumatic L ankle arthritis      -L ankle arthritis s/p tibiotalar fusion                        NWB L Lex x 8 week, maintain short leg splint                        continue to ice and elevate                       Will remove splint in 2 weeks and convert to [facility]    - acute renal failure, acute chest pain and abdominal pain- resolved                                           renal function normalized                                           VQ scan low prob PE                                           Echo essentially normal                                            Cardiac markers do not favor MI- 1 out of 3 was positive                                              ? Related to lisinopril use pre-op- CCM  recommends permanent discontinuation of this medication      - Pain management:                       continue with current regimen      - ABL anemia/Hemodynamics                       improved                        stable    - Medical  issues                        Sleep apnea                            CPAP                         HTN                            dc home on norvasc only                            Pt to discuss with PCP    - DVT/PE prophylaxis:                       now on prophylactic lovenox                        Will dc with 14 days of lovenox    - ID:                        Completed periop clindamycin                        No further antibiosis    - Activity:                       NWB Left Lower extremity                        PT/OT as tolerated for now    - FEN/GI prophylaxis/Foley/Lines:                      reg diet    -Ex-fix/Splint care:                       Continue with current splint                       Reinforce as needed   - Dispo:                      dc home today                           Follow up with ortho in 2 weeks                           Follow up with pcp and cards in 1 week- discuss possible medication changes and recent hospitalization      Signed:  Jari Pigg, PA-C [facility] [phone] (P) 02/29/2016, 10:56 AM

## 2016-02-29 NOTE — Progress Notes (Signed)
Provided discharge information and answered all questions.

## 2016-02-29 NOTE — Progress Notes (Signed)
Occupational Therapy Treatment Patient Details Name: [name] MRN: [id number] DOB: January 15, 1966 Today's Date: 02/29/2016    History of present illness Pt is a 50 y/o male s/p tibiotalar fusion surgery secondary to prior history of a complex, post-traumatic L ankle fx. PMH including but not limited to ORIF for L calcaneus fx in 2014, hardware removal in 2014, HTN, ETOH and hx of cocaine abuse.  Post op developed chest pain, dyspnea, AKI.  8/11 doppler Bil LEs, no DVT.  8/12 V/Q scan, Very low probability for pulmonary embolism.    OT comments  Pt has made good progress with OT.  Pt demonstrates overall Mod I level with functional mobility and transfers.  Pt reports no difficulty with bathing and dressing and demonstrates safety with functional transfers.  Pt's mother present during session and reports they are both home and have all necessary DME.  Pt reports he is awaiting crutches and then d/c home today.  Discharge from therapy as pt has met all goals, will continue to benefit from [facility] to address safety with IADLs in the home.  Follow Up Recommendations  Home health OT (safety assessment for IADL)    Equipment Recommendations  None recommended by OT    Recommendations for Other Services      Precautions / Restrictions Precautions Precautions: Fall Restrictions Weight Bearing Restrictions: Yes LLE Weight Bearing: Non weight bearing       Mobility Bed Mobility Overal bed mobility: Modified Independent Bed Mobility: Supine to Sit;Sit to Supine     Supine to sit: Modified independent (Device/Increase time) Sit to supine: Modified independent (Device/Increase time)   General bed mobility comments: increased time  Transfers Overall transfer level: Modified independent Equipment used: Rolling walker (2 wheeled) Transfers: Sit to/from Stand Sit to Stand: Modified independent (Device/Increase time)         General transfer comment: Pt demonstrated good safety awareness and  use of RW during mobility and adhered to NWB status during functional mobility and transfers.  Pt reports ready to d/c home.    Balance     Sitting balance-Leahy Scale: Good       Standing balance-Leahy Scale: Poor                     ADL Overall ADL's : Modified independent;At baseline             Lower Body Bathing: Modified independent;Sit to/from stand       Lower Body Dressing: Modified independent;Sit to/from stand   Toilet Transfer: Modified Independent;Ambulation;RW   Toileting- Clothing Manipulation and Hygiene: Modified independent;Sit to/from stand       Functional mobility during ADLs: Modified independent;Rolling walker General ADL Comments: Pt is knowledgeable in compensatory strategies for ADL. Sits on shower seat and swings R LE over edge of tub leaving the L on the outside of the curtain. Pt's mother present and reports they have all necessary DME and are awaiting crutches prior to d/c.                 Cognition   Behavior During Therapy: WFL for tasks assessed/performed Overall Cognitive Status: Within Functional Limits for tasks assessed                         Exercises General Exercises - Lower Extremity Long Arc Quad: AROM;Left;Seated;15 reps Hip ABduction/ADduction: AROM;Left;10 reps;Supine Straight Leg Raises: AROM;Left;10 reps;Supine           Pertinent Vitals/ Pain  Pain Assessment: Faces Faces Pain Scale: Hurts a little bit Pain Location: Lt ankle Pain Descriptors / Indicators: Aching;Sore Pain Intervention(s): Monitored during session;Repositioned;Limited activity within patient's tolerance      Progress Toward Goals  OT Goals(current goals can now be found in the care plan section)  Progress towards OT goals: Goals met/education completed, patient discharged from OT  Acute Rehab OT Goals Patient Stated Goal: return home  Plan All goals met and education completed, patient discharged from [place] During Treatment: Rolling walker   Activity Tolerance Patient tolerated treatment well   Patient Left in bed;with call bell/phone within reach   Nurse Communication  (recommendation for [facility])        Time: 1425-1434 OT Time Calculation (min): 9 min  Charges: OT General Charges $OT Visit: 1 Procedure OT Treatments $Self Care/Home Management : 8-22 mins  Simonne Come, [place] 02/29/2016, 2:41 PM

## 2016-02-29 NOTE — Progress Notes (Signed)
Occupational Therapy Discharge Patient Details Name: [name] MRN: [id number] DOB: 09/27/1965 Today's Date: 02/29/2016 Time: 1425-1434 OT Time Calculation (min): 9 min  Patient discharged from OT services secondary to goals met and no further OT needs identified.  Please see latest therapy progress note for current level of functioning and progress toward goals.    Progress and discharge plan discussed with patient and/or caregiver: Patient/Caregiver agrees with plan  GO     ,  02/29/2016, 2:45 PM  

## 2016-02-29 NOTE — Progress Notes (Signed)
Orthopedic Tech Progress Note Patient Details:  [name] 08/24/1965 [id number]  Ortho Devices Type of Ortho Device: Crutches Ortho Device/Splint Interventions: Application   [name] 02/29/2016, 3:01 PM

## 2016-02-29 NOTE — Progress Notes (Signed)
Provided lovenox education and education kit and patient performed teachback demonstration of lovenox injection. Answered all questions.

## 2016-02-29 NOTE — Progress Notes (Signed)
Orthopedic Tech Progress Note Patient Details:  [name] [birth year] [id number]  Patient ID: [name], male   DOB: 1966/04/29, 50 y.o.   MRN: [id number]   [name] 02/29/2016, 3:02 PM Viewed order from doctor's order list

## 2016-02-29 NOTE — Care Management Note (Signed)
Case Management Note  Patient Details  Name: [name] MRN: [id number] Date of Birth: 1965-12-22  Subjective/Objective:                    Action/Plan: Plan is to d/c  today with home health services (PT).  Expected Discharge Date:   02/29/2016           Expected Discharge Plan:  [place] (Resides with mom, Marketing executive)  In-House Referral:     Discharge planning Services  CM Consult  Post Acute Care Choice:    Choice offered to:  Patient, Parent  DME Arranged:    DME Agency:     HH Arranged:  PT [place]:  [facility] (Referral made with Butch Penny)  Status of Service:   COMPLETED   If discussed at [place] of Stay Meetings, dates discussed:    Additional Comments:  Sharin Mons, RN 02/29/2016, 12:55 PM

## 2016-02-29 NOTE — Discharge Instructions (Signed)
Or[facility]opaedic [facility] Discharge Instructions   General Discharge Instructions  WEIGHT BEARING STATUS: Nonweight bearing left leg  RANGE OF MOTION/ACTIVITY: no range of motion L foot, ok to move left knee and hip  Wound Care: keep splint clean and dry, do not remove splint. We will remove splint at first follow up   [name] have likely been given narcotic medications to help control your pain.  After a traumatic event that results in an fracture (broken bone) with or without surgery, it is ok to use narcotic pain medications to help control one's pain.  We understand that everyone responds to pain differently and each individual patient will be evaluated on a regular basis for the continued need for narcotic medications. Ideally, narcotic medication use should last no more than 6-8 weeks (coinciding with fracture healing).   As a patient it is your responsibility as well to monitor narcotic medication use and report the amount and frequency you use these medications when you come to your office visit.   We would also advise that if you are using narcotic medications, you should take a dose prior to therapy to maximize you participation.  IF YOU ARE ON NARCOTIC MEDICATIONS IT IS NOT PERMISSIBLE TO OPERATE A MOTOR VEHICLE (MOTORCYCLE/CAR/TRUCK/MOPED) OR HEAVY MACHINERY DO NOT MIX NARCOTICS WITH OTHER CNS (CENTRAL NERVOUS SYSTEM) DEPRESSANTS SUCH AS ALCOHOL  Diet: as you were eating previously.  Can use over the counter stool softeners and bowel preparations, such as Miralax, to help with bowel movements.  Narcotics can be constipating.  Be sure to drink plenty of fluids    STOP SMOKING OR USING NICOTINE PRODUCTS!!!!  As discussed nicotine severely impairs your body's ability to heal surgical and traumatic wounds but also impairs bone healing.  Wounds and bone heal by forming microscopic blood vessels (angiogenesis) and nicotine is a vasoconstrictor  (essentially, shrinks blood vessels).  Therefore, if vasoconstriction occurs to these microscopic blood vessels they essentially disappear and are unable to deliver necessary nutrients to the healing tissue.  This is one modifiable factor that you can do to dramatically increase your chances of healing your injury.    (This means no smoking, no nicotine gum, patches, etc)  DO NOT USE NONSTEROIDAL ANTI-INFLAMMATORY DRUGS (NSAID'S)  Using products such as Advil (ibuprofen), Aleve (naproxen), Motrin (ibuprofen) for additional pain control during fracture healing can delay and/or prevent the healing response.  If you would like to take over the counter (OTC) medication, Tylenol (acetaminophen) is ok.  However, some narcotic medications that are given for pain control contain acetaminophen as well. Therefore, you should not exceed more than 4000 mg of tylenol in a day if you do not have liver disease.  Also note that there are may OTC medicines, such as cold medicines and allergy medicines that my contain tylenol as well.  If you have any questions about medications and/or interactions please ask your doctor/PA or your pharmacist.      ICE AND ELEVATE INJURED/OPERATIVE EXTREMITY  Using ice and elevating the injured extremity above your heart can help with swelling and pain control.  Icing in a pulsatile fashion, such as 20 minutes on and 20 minutes off, can be followed.    Do not place ice directly on skin. Make sure there is a barrier between to skin and the ice pack.    Using frozen items such as frozen peas works well as the conform nicely to the are that needs to be iced.  USE AN ACE WRAP OR TED HOSE FOR SWELLING CONTROL  In addition to icing and elevation, Ace wraps or TED hose are used to help limit and resolve swelling.  It is recommended to use Ace wraps or TED hose until you are informed to stop.    When using Ace Wraps start the wrapping distally (farthest away from the body) and wrap proximally  (closer to the body)   Example: If you had surgery on your leg or thing and you do not have a splint on, start the ace wrap at the toes and work your way up to the thigh        If you had surgery on your upper extremity and do not have a splint on, start the ace wrap at your fingers and work your way up to the upper arm  IF YOU ARE IN A SPLINT OR CAST DO NOT [place]   If your splint gets wet for any reason please contact the office immediately. You may shower in your splint or cast as long as you keep it dry.  This can be done by wrapping in a cast cover or garbage back (or similar)  Do Not stick any thing down your splint or cast such as pencils, money, or hangers to try and scratch yourself with.  If you feel itchy take benadryl as prescribed on the bottle for itching  IF YOU ARE IN A CAM BOOT (BLACK BOOT)  You may remove boot periodically. Perform daily dressing changes as noted below.  Wash the liner of the boot regularly and wear a sock when wearing the boot. It is recommended that you sleep in the boot until told otherwise  CALL THE OFFICE WITH ANY QUESTIONS OR CONCERNS: [phone]

## 2016-02-29 NOTE — Progress Notes (Signed)
Orthopaedic Trauma Service Progress Note  Subjective  Doing great Wants to go home  No CP, no SOB No abd pain   No other complaints   Tolerating diet Working with therapy   Creatinine normalized   Review of Systems  Constitutional: Negative for fever.  Eyes: Negative for blurred vision and double vision.  Respiratory: Negative for shortness of breath and wheezing.   Cardiovascular: Negative for chest pain and palpitations.  Gastrointestinal: Negative for abdominal pain, nausea and vomiting.  Genitourinary: Negative for dysuria.  Neurological: Negative for tingling, sensory change and headaches.     Objective   BP (!) 148/95 (BP Location: Left Arm)   Pulse 81   Temp 97.6 F (36.4 C)   Resp 18   Ht 6' (1.829 m)   Wt 105.5 kg (232 lb 9.6 oz)   SpO2 99%   BMI 31.55 kg/m   Intake/Output      08/13 0701 - 08/14 0700 08/14 0701 - 08/15 0700   P.O. 760    I.V. (mL/kg) 1418.8 (13.4)    IV Piggyback 55    Total Intake(mL/kg) 2233.8 (21.2)    Urine (mL/kg/hr) 1550 (0.6)    Total Output 1550     Net +683.8            Labs  Results for ZYERE, ZUK (MRN [id number]) as of 02/29/2016 10:30  Ref. Range 02/29/2016 07:08  WBC Latest Ref Range: 4.0 - 10.5 K/uL 5.8  RBC Latest Ref Range: 4.22 - 5.81 MIL/uL 3.59 (L)  Hemoglobin Latest Ref Range: 13.0 - 17.0 g/dL 10.6 (L)  HCT Latest Ref Range: 39.0 - 52.0 % 32.2 (L)  MCV Latest Ref Range: 78.0 - 100.0 fL 89.7  MCH Latest Ref Range: 26.0 - 34.0 pg 29.5  MCHC Latest Ref Range: 30.0 - 36.0 g/dL 32.9  RDW Latest Ref Range: 11.5 - 15.5 % 13.1  Platelets Latest Ref Range: 150 - 400 K/uL 189    Exam  Gen: awake and alert, sitting on EOB, NAD, working with therapy  Lungs:  No wheezing  Cardiac: RRR, s1 and s2 Abd: + BS, NTND Ext:       Left Lower Extremity                        Dressing stable                        DPN, SPN, TN sensation grossly intact                       Ext warm                       Brisk cap  refill                       + DP pulse                       EHL, FHL, lesser toe motor functions intact   Assessment and Plan   POD/HD#: 20  50 y/o male s/p L tibiotalar fusion due to post-traumatic L ankle arthritis      -L ankle arthritis s/p tibiotalar fusion                        NWB L Lex x 8 week, maintain short leg splint  continue to ice and elevate                       Will remove splint in 2 weeks and convert to [facility]    - acute renal failure, acute chest pain and abdominal pain- resolved                                           renal function normalized                                           VQ scan low prob PE                                           Echo essentially normal                                            Cardiac markers do not favor MI- 1 out of 3 was positive                                              ? Related to lisinopril use pre-op- CCM recommends permanent discontinuation of this medication      - Pain management:                       continue with current regimen      - ABL anemia/Hemodynamics                       improved                        stable    - Medical issues                        Sleep apnea                                             CPAP                         HTN                                          dc home on norvasc only         Pt to discuss with PCP    - DVT/PE prophylaxis:                       now on prophylactic lovenox    Will dc with 14 days of lovenox    - ID:  Completed periop clindamycin                        No further antibiosis    - Activity:                       NWB Left Lower extremity                        PT/OT as tolerated for now    - FEN/GI prophylaxis/Foley/Lines:                      reg diet    -Ex-fix/Splint care:                       Continue with current splint                       Reinforce as needed   - Dispo:                       dc home today   Follow up with ortho in 2 weeks   Follow up with pcp and cards in 1 week- discuss possible medication changes and recent hospitalization    Jari Pigg, PA-C [facility] [phone] 02/29/2016 10:29 AM

## 2016-02-29 NOTE — Progress Notes (Signed)
Physical Therapy Treatment Patient Details Name: [name] MRN: [id number] DOB: January 11, 1966 Today's Date: 02/29/2016    History of Present Illness Pt is a 50 y/o male s/p tibiotalar fusion surgery secondary to prior history of a complex, post-traumatic L ankle fx. PMH including but not limited to ORIF for L calcaneus fx in 2014, hardware removal in 2014, HTN, ETOH and hx of cocaine abuse.  Post op developed chest pain, dyspnea, AKI.  8/11 doppler Bil LEs, no DVT.  8/12 V/Q scan, Very low probability for pulmonary embolism.     PT Comments    Patient is making good progress with PT.  From a mobility standpoint anticipate patient will be ready for DC home when medically ready.     Follow Up Recommendations  Home health PT;Supervision for mobility/OOB     Equipment Recommendations  crutches    Recommendations for Other Services OT consult     Precautions / Restrictions Precautions Precautions: Fall Restrictions Weight Bearing Restrictions: Yes LLE Weight Bearing: Non weight bearing    Mobility  Bed Mobility Overal bed mobility: Modified Independent             General bed mobility comments: increased time  Transfers Overall transfer level: Needs assistance Equipment used: Rolling walker (2 wheeled) Transfers: Sit to/from Stand Sit to Stand: Supervision         General transfer comment: pt stood from EOB without RW; cues for safety and maintaining NWB status; no LOB  Ambulation/Gait Ambulation/Gait assistance: Supervision Ambulation Distance (Feet): 110 Feet Assistive device: Rolling walker (2 wheeled) Gait Pattern/deviations: Step-to pattern Gait velocity: decreased   General Gait Details: slow, steady gait; maintained NWB throughout   Stairs         General stair comments: pt has 1 step to enter home; discussed sequencing/technique with use of RW and crutches with pt able to verbalize understanding and teach back  Wheelchair Mobility    Modified  [place] (Stroke Patients Only)       Balance     Sitting balance-Leahy Scale: Good       Standing balance-Leahy Scale: Poor                      Cognition Arousal/Alertness: Awake/alert Behavior During Therapy: WFL for tasks assessed/performed Overall Cognitive Status: Within Functional Limits for tasks assessed                      Exercises General Exercises - Lower Extremity Long Arc Quad: AROM;Left;Seated;15 reps Hip ABduction/ADduction: AROM;Left;10 reps;Supine Straight Leg Raises: AROM;Left;10 reps;Supine    General Comments General comments (skin integrity, edema, etc.): educated pt on positioning       Pertinent Vitals/Pain Pain Assessment: Faces Faces Pain Scale: Hurts a little bit Pain Location: L ankle Pain Descriptors / Indicators: Aching;Sore Pain Intervention(s): Monitored during session;Premedicated before session;Repositioned    Home Living                      Prior Function            PT Goals (current goals can now be found in the care plan section) Acute Rehab PT Goals Patient Stated Goal: return home PT Goal Formulation: With patient Time For Goal Achievement: 03/03/16 Potential to Achieve Goals: Good Progress towards PT goals: Progressing toward goals    Frequency  Min 5X/week    PT Plan Current plan remains appropriate    Co-evaluation  End of Session Equipment Utilized During Treatment: Gait belt Activity Tolerance: Patient tolerated treatment well Patient left: with call bell/phone within reach;with family/visitor present;in bed     Time: 1020-1043 PT Time Calculation (min) (ACUTE ONLY): 23 min  Charges:  $Gait Training: 8-22 mins $Therapeutic Exercise: 8-22 mins                    G Codes:      [place] April, PTA Pager: [phone]   02/29/2016, 1:56 PM

## 2016-03-01 ENCOUNTER — Telehealth: Payer: Self-pay

## 2016-03-01 NOTE — Telephone Encounter (Signed)
1st attempt for TCM outreach. No contact. Left VM with contact info.

## 2016-03-01 NOTE — Telephone Encounter (Signed)
Transition Care Management Follow-up Telephone Call    Date discharged? 02/26/2016  How have you been since you were released from the hospital? [place].   Any patient concerns? None at this time    Items Reviewed:  Medications reviewed: Yes  Allergies reviewed: Yes  Dietary changes reviewed: Yes  Referrals reviewed: Yes   Functional Questionnaire:  Independent - I Dependent - D    Activities of Daily Living (ADLs):    Personal hygiene - I Dressing - I Eating - I Maintaining continence - I Transferring - I   Independent Activities of Daily Living (iADLs): Basic communication skills - I Transportation - I Meal preparation  - I Shopping - I Housework - I  Managing medications - I  Managing personal finances - I   Confirmed importance and date/time of follow-up visits scheduled YES  Provider Appointment booked with PCP 03/02/16 @ 1100  Confirmed with patient if condition begins to worsen call PCP or go to the ER.  Patient was given the office number and encouraged to call back with question or concerns: YES

## 2016-03-02 ENCOUNTER — Ambulatory Visit (INDEPENDENT_AMBULATORY_CARE_PROVIDER_SITE_OTHER): Payer: 59 | Admitting: Family Medicine

## 2016-03-02 ENCOUNTER — Encounter: Payer: Self-pay | Admitting: Family Medicine

## 2016-03-02 VITALS — BP 140/74 | HR 96 | Temp 97.7°F | Ht 72.0 in | Wt 232.0 lb

## 2016-03-02 DIAGNOSIS — T464X5A Adverse effect of angiotensin-converting-enzyme inhibitors, initial encounter: Secondary | ICD-10-CM

## 2016-03-02 DIAGNOSIS — I10 Essential (primary) hypertension: Secondary | ICD-10-CM

## 2016-03-02 DIAGNOSIS — N179 Acute kidney failure, unspecified: Secondary | ICD-10-CM | POA: Diagnosis not present

## 2016-03-02 DIAGNOSIS — G9050 Complex regional pain syndrome I, unspecified: Secondary | ICD-10-CM | POA: Diagnosis not present

## 2016-03-02 DIAGNOSIS — M12572 Traumatic arthropathy, left ankle and foot: Secondary | ICD-10-CM | POA: Diagnosis not present

## 2016-03-02 DIAGNOSIS — M19172 Post-traumatic osteoarthritis, left ankle and foot: Secondary | ICD-10-CM

## 2016-03-02 LAB — BASIC METABOLIC PANEL
BUN: 16 mg/dL (ref 6–23)
CO2: 28 mEq/L (ref 19–32)
Calcium: 9.8 mg/dL (ref 8.4–10.5)
Chloride: 105 mEq/L (ref 96–112)
Creatinine, Ser: 1.15 mg/dL (ref 0.40–1.50)
GFR: 71.46 mL/min (ref >60.00)
Glucose, Bld: 89 mg/dL (ref 70–99)
Potassium: 3.8 mEq/L (ref 3.5–5.1)
Sodium: 140 mEq/L (ref 135–145)
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
## 2016-03-02 NOTE — Progress Notes (Signed)
Pre visit review using our clinic review tool, if applicable. No additional management support is needed unless otherwise documented below in the visit note. 

## 2016-03-03 ENCOUNTER — Telehealth: Payer: Self-pay

## 2016-03-03 NOTE — Telephone Encounter (Signed)
Opened in error

## 2016-03-10 NOTE — Addendum Note (Signed)
Addendum  created 03/10/16 [id number] by Lyndle Herrlich, MD   Sign clinical note

## 2016-03-10 NOTE — Addendum Note (Signed)
Addendum  created 03/10/16 0859 by Lyndle Herrlich, MD   Anesthesia Intra Blocks edited, Sign clinical note

## 2016-03-16 ENCOUNTER — Ambulatory Visit: Payer: Self-pay | Admitting: Family Medicine

## 2016-06-02 ENCOUNTER — Other Ambulatory Visit: Payer: Self-pay | Admitting: Family Medicine

## 2016-06-23 ENCOUNTER — Telehealth: Payer: Self-pay | Admitting: Family Medicine

## 2016-06-23 MED ORDER — AMLODIPINE BESYLATE 10 MG PO TABS: 10.0000 mg | ORAL_TABLET | Freq: Every day | ORAL | 1 refills | Status: DC

## 2016-06-23 NOTE — Telephone Encounter (Signed)
Patient Name: [name]  DOB: [birth year]    Initial Comment Ermalene Postin states his blood pressure is getting high again. His blood pressure is 164/112, which is high for him.   Nurse Assessment  Nurse: Christel Mormon, RN, Levada Dy Date/Time Eilene Ghazi Time): 06/23/2016 2:19:37 PM  Confirm and document reason for call. If symptomatic, describe symptoms. ---Caller is currently on Amlodipine 5mg  - he was taken off his Lisionpril - current od pressure is 164/112. He states this is high for him as he normally ran 120-130's - he states he was instructed to keep an eye on it but he didnt and the last 3 days, the diastolic has been over [id number]. No additional symptoms "I had felt fine". He is to have another surgery in about 3 months.  Does the patient have any new or worsening symptoms? ---Yes  Will a triage be completed? ---Yes  Related visit to physician within the last 2 weeks? ---No  Does the PT have any chronic conditions? (i.e. diabetes, asthma, etc.) ---Yes  List chronic conditions. ---HTN, allergies, depression, ankle fusion -still takes Amlodipine, Prozac, calcium, Vit C, Melatonin, baby ASA only-  Is this a behavioral health or substance abuse call? ---No     Guidelines    Guideline Title Affirmed Question Affirmed Notes  High Blood Pressure BP ? 180/110    Final Disposition User   See Physician within [facility], RN, Levada Dy    Comments  Please send note too office physician today. He states he can not afford to make an appt. He is still out of work and not able to pay the co-pay. He states he was told to call office if BP >140/90. He uses the [facility] and his mother is the one actually helping him buy his medications. He is to have a revision of his ankle fusion in about 3 months due to deterioration of a section of bone.  Spoke with office triage nurse- instructed to sent in note and will get it to the doctor and if pt needs anything more urgent, then he needs to go to ER. I instructed pt that  he should hear something by the end of business day but if not, he is to call office in am and if he needs something more urgent, then he needs to go to the ER.   Referrals  REFERRED TO PCP OFFICE   Disagree/Comply: Comply

## 2016-06-23 NOTE — Addendum Note (Signed)
Addended by: Carter Kitten on: 06/23/2016 04:55 PM   Modules accepted: Orders

## 2016-06-23 NOTE — Telephone Encounter (Signed)
Have pt increase amlodipine to 10 mg daily if pulse not < 60 (at last OV this was in 90s). Call in new prescription if needed. Change on med list at least.  If BP not decreasing to < 140/90 he needs to call PCP to schedule an appt or discus further treatment.   If CP, SOB.. Go to ER.

## 2016-06-23 NOTE — Telephone Encounter (Signed)
Mr Defusco notified as instructed by telephone. Amlodipine 10mg  prescriptions sent into [facility].  Medication list updated.

## 2016-06-30 NOTE — Telephone Encounter (Signed)
Pt left v/m he forgot to take amlodipine 10 mg  06/29/16 and has not taken it so far today; BP 185/126. Pt request cb. I called pt at both contact #s and left v/m for pt to call [facility]. I spoke with pts mom to get a contact # and she said he was at a neighbors house and said to call [phone] but no answer and nov/m. Tried pt cell again but no answer.

## 2016-06-30 NOTE — Telephone Encounter (Signed)
I spoke with [name] and and he took amlodipine earlier and recently BP down to 165/104. [name] not having any symptoms. No H/a,dizziness, CP or SOB. [name] scheduled appt with Dr Diona Browner on 07/01/16 at 11:45. If [name] condition worsens tonight [name] will go to ED.

## 2016-07-01 ENCOUNTER — Encounter: Payer: Self-pay | Admitting: Family Medicine

## 2016-07-01 ENCOUNTER — Ambulatory Visit (INDEPENDENT_AMBULATORY_CARE_PROVIDER_SITE_OTHER): Payer: 59 | Admitting: Family Medicine

## 2016-07-01 DIAGNOSIS — I10 Essential (primary) hypertension: Secondary | ICD-10-CM | POA: Diagnosis not present

## 2016-07-01 MED ORDER — CLONIDINE HCL 0.1 MG PO TABS
0.1000 mg | ORAL_TABLET | Freq: Once | ORAL | Status: AC
  Administered 2016-07-01: 0.1 mg via ORAL

## 2016-07-01 MED ORDER — CHLORTHALIDONE 50 MG PO TABS: 50.0000 mg | ORAL_TABLET | Freq: Every day | ORAL | 11 refills | Status: DC

## 2016-07-01 NOTE — Progress Notes (Signed)
   Subjective:    Patient ID: [name], male    DOB: [birth year], 50 y.o.   MRN: [id number]  HPI 50 year old smoker patient of Dr. Lorelei Pont with history of HTN  Presents with elevated blood pressures. He reports he noted BPs elevated in last week. Was on lisinopril and Amlodipine in past.  Had reaction to lisinopril possibly ( ARF)... So took it off 02/2016.   Initially BP was okay.. But had not checked until earlier this week.  PCP increase amlodipine to 10 mg daily. BP remains high. Yesterday  185/126.  He feels okay, no dizziness, no CP, no SOB. No headache.    BP Readings from Last 3 Encounters:  07/01/16 (!) 175/111  03/02/16 140/74  02/29/16 (!) 148/95     Review of Systems  Constitutional: Negative for fatigue and fever.  HENT: Negative for ear pain.   Eyes: Negative for pain.  Respiratory: Negative for cough and shortness of breath.   Cardiovascular: Negative for chest pain, palpitations and leg swelling.  Gastrointestinal: Negative for abdominal pain.  Genitourinary: Negative for dysuria.  Musculoskeletal: Negative for arthralgias.  Neurological: Negative for syncope, light-headedness and headaches.  Psychiatric/Behavioral: Negative for dysphoric mood.       Objective:   Physical Exam  Constitutional: Vital signs are normal. He appears well-developed and well-nourished.  HENT:  Head: Normocephalic.  Right Ear: Hearing normal.  Left Ear: Hearing normal.  Nose: Nose normal.  Mouth/Throat: Oropharynx is clear and moist and mucous membranes are normal.  Neck: Trachea normal. Carotid bruit is not present. No thyroid mass and no thyromegaly present.  Cardiovascular: Normal rate, regular rhythm and normal pulses.  Exam reveals no gallop, no distant heart sounds and no friction rub.   No murmur heard. chronic peripheral edema in left leg  Pulmonary/Chest: Effort normal and breath sounds normal. No respiratory distress.  Skin: Skin is warm, dry and intact. No rash  noted.  Psychiatric: He has a normal mood and affect. His speech is normal and behavior is normal. Thought content normal.          Assessment & Plan:

## 2016-07-01 NOTE — Assessment & Plan Note (Signed)
Worsened control after stopping ACEI due to ARF.  Will have pt stay on higher dose of amlodipine and add chlorthalidone.  Hesitant to use BBlocker as pulse low nml on CCB. Avoid ACEI and ARB for now as possible ARF in hospital. Close follow up with PCP in 2 weeks or sooner if BP not lowering.

## 2016-07-01 NOTE — Addendum Note (Signed)
Addended by: Carter Kitten on: 07/01/2016 12:58 PM   Modules accepted: Orders

## 2016-07-01 NOTE — Patient Instructions (Signed)
Quit smoking.  Work on low salt diet.  Continue amlodipine 10- mg daily. Start chlorthalidone daily for blood pressure control.  Follow blood pressure at home.. Call if not < 140/90 by early next week.

## 2016-07-01 NOTE — Progress Notes (Signed)
Pre visit review using our clinic review tool, if applicable. No additional management support is needed unless otherwise documented below in the visit note. 

## 2016-07-05 ENCOUNTER — Telehealth: Payer: Self-pay

## 2016-07-05 NOTE — Telephone Encounter (Signed)
Pt left v/m; pt was seen 07/01/16; pt started BP med as instructed on 07/02/16. Since [id number] systolic is staying in the Q000111Q and the diastolic is between Q000111Q. Pt has f/u with Dr Lorelei Pont on 07/20/16. Pt request cb if needs to do something different.

## 2016-07-06 NOTE — Telephone Encounter (Signed)
That sounds better. I will see him in a few weeks.

## 2016-07-06 NOTE — Telephone Encounter (Signed)
Mr. Banfield notified as instructed by telephone.

## 2016-07-20 ENCOUNTER — Ambulatory Visit (INDEPENDENT_AMBULATORY_CARE_PROVIDER_SITE_OTHER): Payer: 59 | Admitting: Family Medicine

## 2016-07-20 DIAGNOSIS — Z0289 Encounter for other administrative examinations: Secondary | ICD-10-CM

## 2016-07-21 ENCOUNTER — Ambulatory Visit (INDEPENDENT_AMBULATORY_CARE_PROVIDER_SITE_OTHER): Payer: 59 | Admitting: Family Medicine

## 2016-07-21 ENCOUNTER — Encounter: Payer: Self-pay | Admitting: Family Medicine

## 2016-07-21 VITALS — BP 170/107 | HR 98 | Temp 97.7°F | Ht 72.0 in | Wt 239.5 lb

## 2016-07-21 DIAGNOSIS — F321 Major depressive disorder, single episode, moderate: Secondary | ICD-10-CM

## 2016-07-21 DIAGNOSIS — I10 Essential (primary) hypertension: Secondary | ICD-10-CM

## 2016-07-21 DIAGNOSIS — K5903 Drug induced constipation: Secondary | ICD-10-CM

## 2016-07-21 MED ORDER — LOSARTAN POTASSIUM 50 MG PO TABS: 50.0000 mg | ORAL_TABLET | Freq: Every day | ORAL | 3 refills | Status: DC

## 2016-07-21 MED ORDER — FLUOXETINE HCL 40 MG PO CAPS: 80.0000 mg | ORAL_CAPSULE | Freq: Every day | ORAL | 3 refills | Status: DC

## 2016-07-21 NOTE — Patient Instructions (Signed)
CONSTIPATION Difficult, uncomfortable, infrequent BM  1. Warm prune juice, hot water, tea, coffee, apple juice 2. Prevention: drink 8 glasses water daily, FIBER (raw fruit, veggies, bran cereal, whole grains), regular exercise 3. Bulk formers like Metamucil (psyllium), Citrucel (methylcellulose) usually help 4. Stool softeners (Docusate) occaisionally OK 5. Occaisional over the counter Miralax usually safe 6. Overuse of stimulant  laxatives (Ex-Lax, Mag Citrate, Dulcolax, etc.) can be habit forming  
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
## 2016-07-21 NOTE — Progress Notes (Signed)
Pre visit review using our clinic review tool, if applicable. No additional management support is needed unless otherwise documented below in the visit note. 

## 2016-09-07 ENCOUNTER — Ambulatory Visit (INDEPENDENT_AMBULATORY_CARE_PROVIDER_SITE_OTHER): Payer: 59 | Admitting: Family Medicine

## 2016-09-07 ENCOUNTER — Encounter: Payer: Self-pay | Admitting: Family Medicine

## 2016-09-07 ENCOUNTER — Encounter (INDEPENDENT_AMBULATORY_CARE_PROVIDER_SITE_OTHER): Payer: Self-pay

## 2016-09-07 VITALS — BP 160/90 | HR 98 | Temp 97.6°F | Ht 72.0 in | Wt 238.5 lb

## 2016-09-07 DIAGNOSIS — I10 Essential (primary) hypertension: Secondary | ICD-10-CM | POA: Diagnosis not present

## 2016-09-07 DIAGNOSIS — F321 Major depressive disorder, single episode, moderate: Secondary | ICD-10-CM | POA: Diagnosis not present

## 2016-09-07 MED ORDER — LOSARTAN POTASSIUM 100 MG PO TABS: 100.0000 mg | ORAL_TABLET | Freq: Every day | ORAL | 1 refills | Status: DC

## 2016-09-07 NOTE — Progress Notes (Signed)
Pre visit review using our clinic review tool, if applicable. No additional management support is needed unless otherwise documented below in the visit note. 
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
## 2016-11-11 ENCOUNTER — Encounter (HOSPITAL_COMMUNITY)
Admission: RE | Admit: 2016-11-11 | Discharge: 2016-11-11 | Disposition: A | Discharge status: Home / Self Care | Payer: 59 | Source: Ambulatory Visit | Attending: Orthopedic Surgery | Admitting: Orthopedic Surgery

## 2016-11-11 ENCOUNTER — Encounter (HOSPITAL_COMMUNITY): Payer: Self-pay

## 2016-11-11 DIAGNOSIS — K219 Gastro-esophageal reflux disease without esophagitis: Secondary | ICD-10-CM | POA: Diagnosis not present

## 2016-11-11 DIAGNOSIS — Z01812 Encounter for preprocedural laboratory examination: Secondary | ICD-10-CM | POA: Insufficient documentation

## 2016-11-11 DIAGNOSIS — G4733 Obstructive sleep apnea (adult) (pediatric): Secondary | ICD-10-CM | POA: Insufficient documentation

## 2016-11-11 DIAGNOSIS — S32000A Wedge compression fracture of unspecified lumbar vertebra, initial encounter for closed fracture: Secondary | ICD-10-CM | POA: Insufficient documentation

## 2016-11-11 DIAGNOSIS — F331 Major depressive disorder, recurrent, moderate: Secondary | ICD-10-CM | POA: Diagnosis not present

## 2016-11-11 DIAGNOSIS — S92002A Unspecified fracture of left calcaneus, initial encounter for closed fracture: Secondary | ICD-10-CM | POA: Insufficient documentation

## 2016-11-11 DIAGNOSIS — Z87898 Personal history of other specified conditions: Secondary | ICD-10-CM | POA: Insufficient documentation

## 2016-11-11 DIAGNOSIS — I10 Essential (primary) hypertension: Secondary | ICD-10-CM | POA: Insufficient documentation

## 2016-11-11 DIAGNOSIS — G9050 Complex regional pain syndrome I, unspecified: Secondary | ICD-10-CM | POA: Diagnosis not present

## 2016-11-11 DIAGNOSIS — F17200 Nicotine dependence, unspecified, uncomplicated: Secondary | ICD-10-CM | POA: Insufficient documentation

## 2016-11-11 HISTORY — DX: Acute kidney failure, unspecified: N17.9

## 2016-11-11 LAB — COMPREHENSIVE METABOLIC PANEL
ALT: 35 U/L (ref 17–63)
AST: 25 U/L (ref 15–41)
Albumin: 4 g/dL (ref 3.5–5.0)
Alkaline Phosphatase: 107 U/L (ref 38–126)
Anion gap: 12 (ref 5–15)
BUN: 20 mg/dL (ref 6–20)
CO2: 26 mmol/L (ref 22–32)
Calcium: 9.4 mg/dL (ref 8.9–10.3)
Chloride: 97 mmol/L — ABNORMAL LOW (ref 101–111)
Creatinine, Ser: 1.05 mg/dL (ref 0.61–1.24)
GFR calc Af Amer: >60 mL/min (ref >60)
GFR calc non Af Amer: >60 mL/min (ref >60)
Glucose, Bld: 114 mg/dL — ABNORMAL HIGH (ref 65–99)
Potassium: 3.4 mmol/L — ABNORMAL LOW (ref 3.5–5.1)
Sodium: 135 mmol/L (ref 135–145)
Total Bilirubin: 0.9 mg/dL (ref 0.3–1.2)
Total Protein: 6.6 g/dL (ref 6.5–8.1)

## 2016-11-11 LAB — CBC
HCT: 44.1 % (ref 39.0–52.0)
Hemoglobin: 15.2 g/dL (ref 13.0–17.0)
MCH: 29.4 pg (ref 26.0–34.0)
MCHC: 34.5 g/dL (ref 30.0–36.0)
MCV: 85.3 fL (ref 78.0–100.0)
Platelets: 207 10*3/uL (ref 150–400)
RBC: 5.17 MIL/uL (ref 4.22–5.81)
RDW: 13.2 % (ref 11.5–15.5)
WBC: 7.7 10*3/uL (ref 4.0–10.5)

## 2016-11-11 LAB — SURGICAL PCR SCREEN
MRSA, PCR: NEGATIVE
Staphylococcus aureus: NEGATIVE

## 2016-11-11 NOTE — Pre-Procedure Instructions (Signed)
[name]  11/11/2016      [facility] - [place], [place] - [address] [place] [address] Phone: [phone] Fax: [phone]    Your procedure is scheduled on Tuesday May 1.  Report to [facility] Admitting at 6:00 A.M.  Call this number if you have problems the morning of surgery:  [phone]   Remember:  Do not eat food or drink liquids after midnight.  Take these medicines the morning of surgery with A SIP OF WATER: amlodipine (norvasc), fluoxetine (prozac)  7 days prior to surgery STOP taking any Aspirin, Aleve, Naproxen, Ibuprofen, Motrin, Advil, Goody's, BC's, all herbal medications, fish oil, and all vitamins    Do not wear jewelry, make-up or nail polish.  Do not wear lotions, powders, or perfumes, or deoderant.  Do not shave 48 hours prior to surgery.  Men may shave face and neck.  Do not bring valuables to the hospital.  [facility] is not responsible for any belongings or valuables.  Contacts, dentures or bridgework may not be worn into surgery.  Leave your suitcase in the car.  After surgery it may be brought to your room.  For patients admitted to the hospital, discharge time will be determined by your treatment team.  Patients discharged the day of surgery will not be allowed to drive home.    Special instructions:    [facility]- Preparing For Surgery  Before surgery, you can play an important role. Because skin is not sterile, your skin needs to be as free of germs as possible. You can reduce the number of germs on your skin by washing with CHG (chlorahexidine gluconate) Soap before surgery.  CHG is an antiseptic cleaner which kills germs and bonds with the skin to continue killing germs even after washing.  Please do not use if you have an allergy to CHG or antibacterial soaps. If your skin becomes reddened/irritated stop using the CHG.  Do not shave (including legs and underarms) for  at least 48 hours prior to first CHG shower. It is OK to shave your face.  Please follow these instructions carefully.   1. Shower the NIGHT BEFORE SURGERY and the MORNING OF SURGERY with CHG.   2. If you chose to wash your hair, wash your hair first as usual with your normal shampoo.  3. After you shampoo, rinse your hair and body thoroughly to remove the shampoo.  4. Use CHG as you would any other liquid soap. You can apply CHG directly to the skin and wash gently with a scrungie or a clean washcloth.   5. Apply the CHG Soap to your body ONLY FROM THE NECK DOWN.  Do not use on open wounds or open sores. Avoid contact with your eyes, ears, mouth and genitals (private parts). Wash genitals (private parts) with your normal soap.  6. Wash thoroughly, paying special attention to the area where your surgery will be performed.  7. Thoroughly rinse your body with warm water from the neck down.  8. DO NOT shower/wash with your normal soap after using and rinsing off the CHG Soap.  9. Pat yourself dry with a CLEAN TOWEL.   10. Wear CLEAN PAJAMAS   11. Place CLEAN SHEETS on your bed the night of your first shower and DO NOT SLEEP WITH PETS.    Day of Surgery: Do not apply any deodorants/lotions. Please wear clean clothes to the hospital/surgery center.  Please read over the following fact sheets that you were given. Coughing and Deep Breathing

## 2016-11-11 NOTE — Progress Notes (Signed)
PCP - [name] Cardiologist - [name]- had to have clearance prior to last surgery in August, pt states he has not had any cardiac issues and did not need to see a cardiologist prior to surgery this time.   Chest x-ray - 02/27/16 EKG - 03/02/16 Stress Test - 12/28/15, Dr. Tora Kindred note from 12/28/15 ECHO - 02/28/16   Sleep Study - 2015 CPAP - pressure 4.5   Patient denies shortness of breath, fever, cough and chest pain at PAT appointment. BP elevated 162/102 on first check, pt states he had taken his blood pressure medicine at 13:45. Re-check 148/87   Patient verbalized understanding of instructions that was given to them at the PAT appointment. Patient expressed that there were no further questions.  Patient was also instructed that they will need to review over the PAT instructions again at home before the surgery.
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
## 2016-11-14 NOTE — Progress Notes (Signed)
Anesthesia Chart Review: Patient is a 51 year old male scheduled for left ankle arthrodesis with iliac crest bone graft, hardware removal left ankle on 11/15/2016 by Dr. Marcelino Scot.  PMH includes HTN, former smoker (quit 07/19/15), polysubstance abuse (alcohol and cocaine abuse; "recovering addict"; last cocaine + UDS 11/24/15), OSA (CPAP), GERD, depression, nephrolithiasis s/p left ureteral stent and stone retraction 02/26/10 and on right 12/30/10, fall from ladder s/p ORIF left calcaneus fracture 08/10/12 with hardware removal 01/10/13, left tibiotalar fusion 5/32/02 complicated by postoperative acute renal failure (related to ACE inhibitor; resolved by discharge), RSD (left foot), nasal reconstruction. BMI 33, consistent with obesity.   - PCP is Dr. Frederico Hamman Copland. - Patient was seen by cardiologist Dr. Wende Bushy on 12/02/15 for evaluation of chest pain and need for cardiac clearance. Earlier that year he had also been started on antihypertensive therapy and was counseled on abstinence from cocaine. He had a stress and echo (see below) with PRN cardiology follow-up recommended at 12/28/15 follow-up.   Medications include amlodipine, aspirin 81 mg, chlorthalidone, Prozac, losartan, melatonin.  BP (!) 148/87   Pulse 95   Temp 37.1 C   Resp 20   Ht 6' (1.829 m)   Wt 243 lb 8 oz (110.5 kg)   SpO2 95%   BMI 33.02 kg/m   EKG 02/26/16: NSR.  Echo 02/28/16: Study Conclusions - Left ventricle: The cavity size was normal. Wall thickness was   normal. Systolic function was normal. The estimated ejection   fraction was in the range of 60% to 65%. Wall motion was normal;   there were no regional wall motion abnormalities. Left   ventricular diastolic function parameters were normal. - Aortic valve: Valve area (VTI): 3.46 cm^2. Valve area (Vmax):   2.98 cm^2. Valve area (Vmean): 2.41 cm^2. - Atrial septum: No defect or patent foramen ovale was identified. - Technically adequate study.  Nuclear stress  test 12/09/15: Pharmacological myocardial perfusion imaging study with no significant  ischemia Normal wall motion, EF estimated at 37% No EKG changes concerning for ischemia at peak stress or in recovery. Consider echocardiogram to confirm EF if clinically indicated Low risk scan (Echo ordered to evaluation EF: LVEF 60-65% 12/18/15.)   CXR 02/27/16: IMPRESSION: No acute cardiopulmonary disease.  Preoperative labs noted. Cr 1.05.   If no acute changes then I anticipate that he can proceed as planned.  [facility] Short Stay Center/Anesthesiology Phone [phone] 11/14/2016 10:02 AM

## 2016-11-15 ENCOUNTER — Inpatient Hospital Stay (HOSPITAL_COMMUNITY): Payer: 59

## 2016-11-15 ENCOUNTER — Encounter (HOSPITAL_COMMUNITY): Payer: Self-pay | Admitting: Urology

## 2016-11-15 ENCOUNTER — Inpatient Hospital Stay (HOSPITAL_COMMUNITY): Payer: 59 | Admitting: Certified Registered Nurse Anesthetist

## 2016-11-15 ENCOUNTER — Encounter (HOSPITAL_COMMUNITY)
Admission: RE | Disposition: A | Discharge status: Home / Self Care | Payer: Self-pay | Source: Ambulatory Visit | Attending: Orthopedic Surgery

## 2016-11-15 ENCOUNTER — Ambulatory Visit (HOSPITAL_COMMUNITY)
Admission: RE | Admit: 2016-11-15 | Discharge: 2016-11-16 | Disposition: A | Discharge status: Home / Self Care | Payer: 59 | Source: Ambulatory Visit | Attending: Orthopedic Surgery | Admitting: Orthopedic Surgery

## 2016-11-15 ENCOUNTER — Inpatient Hospital Stay (HOSPITAL_COMMUNITY): Payer: 59 | Admitting: Vascular Surgery

## 2016-11-15 ENCOUNTER — Ambulatory Visit (HOSPITAL_COMMUNITY): Payer: 59

## 2016-11-15 DIAGNOSIS — Z720 Tobacco use: Secondary | ICD-10-CM | POA: Diagnosis present

## 2016-11-15 DIAGNOSIS — Z91030 Bee allergy status: Secondary | ICD-10-CM | POA: Diagnosis not present

## 2016-11-15 DIAGNOSIS — F329 Major depressive disorder, single episode, unspecified: Secondary | ICD-10-CM | POA: Insufficient documentation

## 2016-11-15 DIAGNOSIS — S82899K Other fracture of unspecified lower leg, subsequent encounter for closed fracture with nonunion: Secondary | ICD-10-CM

## 2016-11-15 DIAGNOSIS — I10 Essential (primary) hypertension: Secondary | ICD-10-CM | POA: Diagnosis present

## 2016-11-15 DIAGNOSIS — G90522 Complex regional pain syndrome I of left lower limb: Secondary | ICD-10-CM | POA: Diagnosis not present

## 2016-11-15 DIAGNOSIS — Z8049 Family history of malignant neoplasm of other genital organs: Secondary | ICD-10-CM | POA: Diagnosis not present

## 2016-11-15 DIAGNOSIS — Z8619 Personal history of other infectious and parasitic diseases: Secondary | ICD-10-CM | POA: Insufficient documentation

## 2016-11-15 DIAGNOSIS — G4733 Obstructive sleep apnea (adult) (pediatric): Secondary | ICD-10-CM | POA: Diagnosis present

## 2016-11-15 DIAGNOSIS — Z419 Encounter for procedure for purposes other than remedying health state, unspecified: Secondary | ICD-10-CM

## 2016-11-15 DIAGNOSIS — Z91013 Allergy to seafood: Secondary | ICD-10-CM | POA: Diagnosis not present

## 2016-11-15 DIAGNOSIS — Z823 Family history of stroke: Secondary | ICD-10-CM | POA: Insufficient documentation

## 2016-11-15 DIAGNOSIS — G9050 Complex regional pain syndrome I, unspecified: Secondary | ICD-10-CM | POA: Diagnosis present

## 2016-11-15 DIAGNOSIS — Z8041 Family history of malignant neoplasm of ovary: Secondary | ICD-10-CM | POA: Insufficient documentation

## 2016-11-15 DIAGNOSIS — Z7982 Long term (current) use of aspirin: Secondary | ICD-10-CM | POA: Insufficient documentation

## 2016-11-15 DIAGNOSIS — I739 Peripheral vascular disease, unspecified: Secondary | ICD-10-CM | POA: Insufficient documentation

## 2016-11-15 DIAGNOSIS — Z01818 Encounter for other preprocedural examination: Secondary | ICD-10-CM

## 2016-11-15 DIAGNOSIS — M960 Pseudarthrosis after fusion or arthrodesis: Secondary | ICD-10-CM | POA: Diagnosis present

## 2016-11-15 DIAGNOSIS — Z8249 Family history of ischemic heart disease and other diseases of the circulatory system: Secondary | ICD-10-CM | POA: Diagnosis not present

## 2016-11-15 DIAGNOSIS — Y838 Other surgical procedures as the cause of abnormal reaction of the patient, or of later complication, without mention of misadventure at the time of the procedure: Secondary | ICD-10-CM | POA: Insufficient documentation

## 2016-11-15 DIAGNOSIS — M545 Low back pain: Secondary | ICD-10-CM | POA: Diagnosis not present

## 2016-11-15 DIAGNOSIS — Z79899 Other long term (current) drug therapy: Secondary | ICD-10-CM | POA: Diagnosis not present

## 2016-11-15 DIAGNOSIS — Z91041 Radiographic dye allergy status: Secondary | ICD-10-CM | POA: Diagnosis not present

## 2016-11-15 DIAGNOSIS — Z880 Allergy status to penicillin: Secondary | ICD-10-CM | POA: Insufficient documentation

## 2016-11-15 DIAGNOSIS — F17200 Nicotine dependence, unspecified, uncomplicated: Secondary | ICD-10-CM | POA: Diagnosis present

## 2016-11-15 DIAGNOSIS — K219 Gastro-esophageal reflux disease without esophagitis: Secondary | ICD-10-CM | POA: Insufficient documentation

## 2016-11-15 DIAGNOSIS — M159 Polyosteoarthritis, unspecified: Secondary | ICD-10-CM | POA: Insufficient documentation

## 2016-11-15 DIAGNOSIS — Z91040 Latex allergy status: Secondary | ICD-10-CM | POA: Insufficient documentation

## 2016-11-15 DIAGNOSIS — Z811 Family history of alcohol abuse and dependence: Secondary | ICD-10-CM | POA: Diagnosis not present

## 2016-11-15 DIAGNOSIS — Z87891 Personal history of nicotine dependence: Secondary | ICD-10-CM | POA: Diagnosis not present

## 2016-11-15 DIAGNOSIS — Z87442 Personal history of urinary calculi: Secondary | ICD-10-CM | POA: Diagnosis not present

## 2016-11-15 DIAGNOSIS — F1490 Cocaine use, unspecified, uncomplicated: Secondary | ICD-10-CM | POA: Insufficient documentation

## 2016-11-15 DIAGNOSIS — S92909K Unspecified fracture of unspecified foot, subsequent encounter for fracture with nonunion: Secondary | ICD-10-CM | POA: Diagnosis present

## 2016-11-15 HISTORY — PX: ARTHRODESIS FOOT WITH ILIAC CREST BONE GRAFT: SHX5587

## 2016-11-15 HISTORY — PX: HARDWARE REMOVAL: SHX979

## 2016-11-15 LAB — DIFFERENTIAL
Basophils Absolute: 0 10*3/uL (ref 0.0–0.1)
Basophils Relative: 0 %
Eosinophils Absolute: 0.3 10*3/uL (ref 0.0–0.7)
Eosinophils Relative: 4 %
Lymphocytes Relative: 31 %
Lymphs Abs: 2.4 10*3/uL (ref 0.7–4.0)
Monocytes Absolute: 0.7 10*3/uL (ref 0.1–1.0)
Monocytes Relative: 9 %
Neutro Abs: 4.4 10*3/uL (ref 1.7–7.7)
Neutrophils Relative %: 56 %

## 2016-11-15 LAB — CBC
HCT: 40.4 % (ref 39.0–52.0)
Hemoglobin: 13.8 g/dL (ref 13.0–17.0)
MCH: 29.6 pg (ref 26.0–34.0)
MCHC: 34.2 g/dL (ref 30.0–36.0)
MCV: 86.7 fL (ref 78.0–100.0)
Platelets: 201 10*3/uL (ref 150–400)
RBC: 4.66 MIL/uL (ref 4.22–5.81)
RDW: 13.7 % (ref 11.5–15.5)
WBC: 11.6 10*3/uL — ABNORMAL HIGH (ref 4.0–10.5)

## 2016-11-15 LAB — URINALYSIS, ROUTINE W REFLEX MICROSCOPIC
Bilirubin Urine: NEGATIVE
Glucose, UA: NEGATIVE mg/dL
Hgb urine dipstick: NEGATIVE
Ketones, ur: NEGATIVE mg/dL
Leukocytes, UA: NEGATIVE
Nitrite: NEGATIVE
Protein, ur: NEGATIVE mg/dL
Specific Gravity, Urine: 1.019 (ref 1.005–1.030)
pH: 7 (ref 5.0–8.0)

## 2016-11-15 LAB — RAPID URINE DRUG SCREEN, HOSP PERFORMED
Amphetamines: NOT DETECTED
Barbiturates: NOT DETECTED
Benzodiazepines: NOT DETECTED
Cocaine: NOT DETECTED
Opiates: NOT DETECTED
Tetrahydrocannabinol: NOT DETECTED

## 2016-11-15 LAB — SEDIMENTATION RATE: Sed Rate: 4 mm/hr (ref 0–16)

## 2016-11-15 LAB — CREATININE, SERUM
Creatinine, Ser: 1.12 mg/dL (ref 0.61–1.24)
GFR calc Af Amer: >60 mL/min (ref >60)
GFR calc non Af Amer: >60 mL/min (ref >60)

## 2016-11-15 LAB — C-REACTIVE PROTEIN: CRP: 0.9 mg/dL (ref <1.0)

## 2016-11-15 LAB — GLUCOSE, CAPILLARY: Glucose-Capillary: 224 mg/dL — ABNORMAL HIGH (ref 65–99)

## 2016-11-15 LAB — PROTIME-INR
INR: 0.94
Prothrombin Time: 12.5 seconds (ref 11.4–15.2)

## 2016-11-15 LAB — APTT: aPTT: 28 seconds (ref 24–36)

## 2016-11-15 SURGERY — FUSION, JOINT, FOOT, USING ILIAC CREST BONE GRAFT
Anesthesia: General | Laterality: Left

## 2016-11-15 MED ORDER — BISACODYL 5 MG PO TBEC: 5.0000 mg | DELAYED_RELEASE_TABLET | Freq: Every day | ORAL | Status: DC | PRN

## 2016-11-15 MED ORDER — 0.9 % SODIUM CHLORIDE (POUR BTL) OPTIME
TOPICAL | Status: DC | PRN
  Administered 2016-11-15: 1000 mL
  Administered 2016-11-15: 500 mL

## 2016-11-15 MED ORDER — FLUOXETINE HCL 40 MG PO CAPS: 80.0000 mg | ORAL_CAPSULE | Freq: Every day | ORAL | Status: DC

## 2016-11-15 MED ORDER — METOCLOPRAMIDE HCL 5 MG PO TABS: 5.0000 mg | ORAL_TABLET | Freq: Three times a day (TID) | ORAL | Status: DC | PRN

## 2016-11-15 MED ORDER — ONDANSETRON HCL 4 MG/2ML IJ SOLN
INTRAMUSCULAR | Status: DC | PRN
  Administered 2016-11-15: 4 mg via INTRAVENOUS

## 2016-11-15 MED ORDER — LACTATED RINGERS IV SOLN
INTRAVENOUS | Status: DC
  Administered 2016-11-15 (×4): via INTRAVENOUS

## 2016-11-15 MED ORDER — CLINDAMYCIN PHOSPHATE 900 MG/50ML IV SOLN
900.0000 mg | INTRAVENOUS | Status: AC
  Administered 2016-11-15: 900 mg via INTRAVENOUS
  Filled 2016-11-15: qty 50

## 2016-11-15 MED ORDER — PHENYLEPHRINE 40 MCG/ML (10ML) SYRINGE FOR IV PUSH (FOR BLOOD PRESSURE SUPPORT)
PREFILLED_SYRINGE | INTRAVENOUS | Status: AC
  Filled 2016-11-15: qty 10

## 2016-11-15 MED ORDER — FENTANYL CITRATE (PF) 100 MCG/2ML IJ SOLN
INTRAMUSCULAR | Status: AC
  Filled 2016-11-15: qty 2

## 2016-11-15 MED ORDER — ROCURONIUM BROMIDE 10 MG/ML (PF) SYRINGE
PREFILLED_SYRINGE | INTRAVENOUS | Status: AC
  Filled 2016-11-15: qty 10

## 2016-11-15 MED ORDER — FENTANYL CITRATE (PF) 250 MCG/5ML IJ SOLN
INTRAMUSCULAR | Status: AC
  Filled 2016-11-15: qty 5

## 2016-11-15 MED ORDER — METHOCARBAMOL 1000 MG/10ML IJ SOLN
1000.0000 mg | Freq: Four times a day (QID) | INTRAVENOUS | Status: DC | PRN
  Filled 2016-11-15: qty 10

## 2016-11-15 MED ORDER — CHLORHEXIDINE GLUCONATE 4 % EX LIQD: 60.0000 mL | Freq: Once | CUTANEOUS | Status: DC

## 2016-11-15 MED ORDER — DOCUSATE SODIUM 100 MG PO CAPS
100.0000 mg | ORAL_CAPSULE | Freq: Two times a day (BID) | ORAL | Status: DC
  Administered 2016-11-15 – 2016-11-16 (×3): 100 mg via ORAL
  Filled 2016-11-15 (×3): qty 1

## 2016-11-15 MED ORDER — DIPHENHYDRAMINE HCL 12.5 MG/5ML PO ELIX: 12.5000 mg | ORAL_SOLUTION | Freq: Four times a day (QID) | ORAL | Status: DC | PRN

## 2016-11-15 MED ORDER — ASPIRIN 81 MG PO TABS: 81.0000 mg | ORAL_TABLET | Freq: Every day | ORAL | Status: DC

## 2016-11-15 MED ORDER — VITAMIN C 500 MG PO TABS
500.0000 mg | ORAL_TABLET | Freq: Every day | ORAL | Status: DC
  Administered 2016-11-15 – 2016-11-16 (×2): 500 mg via ORAL
  Filled 2016-11-15 (×2): qty 1

## 2016-11-15 MED ORDER — METHOCARBAMOL 1000 MG/10ML IJ SOLN
1000.0000 mg | INTRAVENOUS | Status: AC
  Administered 2016-11-15: 1000 mg via INTRAVENOUS
  Filled 2016-11-15: qty 10

## 2016-11-15 MED ORDER — ONDANSETRON HCL 4 MG PO TABS: 4.0000 mg | ORAL_TABLET | Freq: Four times a day (QID) | ORAL | Status: DC | PRN

## 2016-11-15 MED ORDER — MIDAZOLAM HCL 2 MG/2ML IJ SOLN
INTRAMUSCULAR | Status: AC
  Filled 2016-11-15: qty 2

## 2016-11-15 MED ORDER — POTASSIUM CHLORIDE IN NACL 20-0.9 MEQ/L-% IV SOLN
INTRAVENOUS | Status: DC
  Administered 2016-11-15 – 2016-11-16 (×2): via INTRAVENOUS
  Filled 2016-11-15 (×2): qty 1000

## 2016-11-15 MED ORDER — AMLODIPINE BESYLATE 10 MG PO TABS
10.0000 mg | ORAL_TABLET | Freq: Every day | ORAL | Status: DC
  Administered 2016-11-16: 10 mg via ORAL
  Filled 2016-11-15: qty 1

## 2016-11-15 MED ORDER — DIPHENHYDRAMINE HCL 50 MG/ML IJ SOLN: 12.5000 mg | Freq: Four times a day (QID) | INTRAMUSCULAR | Status: DC | PRN

## 2016-11-15 MED ORDER — FENTANYL CITRATE (PF) 100 MCG/2ML IJ SOLN
INTRAMUSCULAR | Status: DC | PRN
Start: 2016-11-15 — End: 2016-11-15
  Administered 2016-11-15 (×2): 50 ug via INTRAVENOUS
  Administered 2016-11-15: 150 ug via INTRAVENOUS
  Administered 2016-11-15: 50 ug via INTRAVENOUS

## 2016-11-15 MED ORDER — OXYCODONE HCL 5 MG PO TABS
5.0000 mg | ORAL_TABLET | ORAL | Status: DC | PRN
  Administered 2016-11-15 – 2016-11-16 (×3): 10 mg via ORAL
  Filled 2016-11-15 (×3): qty 2

## 2016-11-15 MED ORDER — ENOXAPARIN SODIUM 40 MG/0.4ML ~~LOC~~ SOLN
40.0000 mg | SUBCUTANEOUS | Status: DC
  Administered 2016-11-16: 40 mg via SUBCUTANEOUS
  Filled 2016-11-15: qty 0.4

## 2016-11-15 MED ORDER — ROCURONIUM BROMIDE 100 MG/10ML IV SOLN
INTRAVENOUS | Status: DC | PRN
  Administered 2016-11-15 (×2): 30 mg via INTRAVENOUS
  Administered 2016-11-15: 80 mg via INTRAVENOUS
  Administered 2016-11-15: 15 mg via INTRAVENOUS

## 2016-11-15 MED ORDER — SUGAMMADEX SODIUM 200 MG/2ML IV SOLN
INTRAVENOUS | Status: DC | PRN
Start: 2016-11-15 — End: 2016-11-15
  Administered 2016-11-15: 220 mg via INTRAVENOUS
  Administered 2016-11-15: 10 mg via INTRAVENOUS

## 2016-11-15 MED ORDER — OXYCODONE HCL 5 MG PO TABS
ORAL_TABLET | ORAL | Status: AC
  Filled 2016-11-15: qty 2

## 2016-11-15 MED ORDER — ACETAMINOPHEN 325 MG PO TABS: 650.0000 mg | ORAL_TABLET | Freq: Four times a day (QID) | ORAL | Status: DC | PRN

## 2016-11-15 MED ORDER — ONDANSETRON HCL 4 MG/2ML IJ SOLN
INTRAMUSCULAR | Status: AC
  Filled 2016-11-15: qty 2

## 2016-11-15 MED ORDER — ASPIRIN EC 81 MG PO TBEC: 81.0000 mg | DELAYED_RELEASE_TABLET | Freq: Every day | ORAL | Status: DC

## 2016-11-15 MED ORDER — ASPIRIN EC 81 MG PO TBEC
81.0000 mg | DELAYED_RELEASE_TABLET | Freq: Every day | ORAL | Status: DC
  Administered 2016-11-16: 81 mg via ORAL
  Filled 2016-11-15: qty 1

## 2016-11-15 MED ORDER — SUGAMMADEX SODIUM 200 MG/2ML IV SOLN
INTRAVENOUS | Status: AC
  Filled 2016-11-15: qty 2

## 2016-11-15 MED ORDER — ONDANSETRON HCL 4 MG/2ML IJ SOLN
4.0000 mg | Freq: Four times a day (QID) | INTRAMUSCULAR | Status: DC | PRN
Start: 2016-11-15 — End: 2016-11-16

## 2016-11-15 MED ORDER — ACETAMINOPHEN 650 MG RE SUPP
650.0000 mg | Freq: Four times a day (QID) | RECTAL | Status: DC | PRN
Start: 2016-11-15 — End: 2016-11-16

## 2016-11-15 MED ORDER — METHOCARBAMOL 500 MG PO TABS: 500.0000 mg | ORAL_TABLET | Freq: Four times a day (QID) | ORAL | Status: DC | PRN

## 2016-11-15 MED ORDER — LIDOCAINE HCL (CARDIAC) 20 MG/ML IV SOLN
INTRAVENOUS | Status: DC | PRN
  Administered 2016-11-15: 100 mg via INTRAVENOUS

## 2016-11-15 MED ORDER — VITAMIN C 500 MG/5ML PO SYRP: 500.0000 mg | ORAL_SOLUTION | Freq: Every day | ORAL | Status: DC

## 2016-11-15 MED ORDER — SODIUM CHLORIDE 0.9% FLUSH: 9.0000 mL | INTRAVENOUS | Status: DC | PRN

## 2016-11-15 MED ORDER — HYDROCODONE-ACETAMINOPHEN 7.5-325 MG PO TABS: 1.0000 | ORAL_TABLET | ORAL | Status: DC | PRN

## 2016-11-15 MED ORDER — SUGAMMADEX SODIUM 500 MG/5ML IV SOLN
INTRAVENOUS | Status: AC
  Filled 2016-11-15: qty 5

## 2016-11-15 MED ORDER — FLUOXETINE HCL 20 MG PO CAPS
80.0000 mg | ORAL_CAPSULE | Freq: Every day | ORAL | Status: DC
  Administered 2016-11-16: 80 mg via ORAL
  Filled 2016-11-15: qty 4

## 2016-11-15 MED ORDER — ONDANSETRON HCL 4 MG/2ML IJ SOLN: 4.0000 mg | Freq: Four times a day (QID) | INTRAMUSCULAR | Status: DC | PRN

## 2016-11-15 MED ORDER — METOCLOPRAMIDE HCL 5 MG/ML IJ SOLN: 5.0000 mg | Freq: Three times a day (TID) | INTRAMUSCULAR | Status: DC | PRN

## 2016-11-15 MED ORDER — PROPOFOL 10 MG/ML IV BOLUS
INTRAVENOUS | Status: DC | PRN
  Administered 2016-11-15: 200 mg via INTRAVENOUS

## 2016-11-15 MED ORDER — MIDAZOLAM HCL 5 MG/5ML IJ SOLN
INTRAMUSCULAR | Status: DC | PRN
  Administered 2016-11-15: 2 mg via INTRAVENOUS

## 2016-11-15 MED ORDER — PHENYLEPHRINE HCL 10 MG/ML IJ SOLN
INTRAMUSCULAR | Status: DC | PRN
  Administered 2016-11-15: 80 ug via INTRAVENOUS
  Administered 2016-11-15: 40 ug via INTRAVENOUS

## 2016-11-15 MED ORDER — DEXAMETHASONE SODIUM PHOSPHATE 10 MG/ML IJ SOLN
INTRAMUSCULAR | Status: AC
  Filled 2016-11-15: qty 1

## 2016-11-15 MED ORDER — POLYETHYLENE GLYCOL 3350 17 G PO PACK
17.0000 g | PACK | Freq: Every day | ORAL | Status: DC
  Administered 2016-11-15 – 2016-11-16 (×2): 17 g via ORAL
  Filled 2016-11-15 (×2): qty 1

## 2016-11-15 MED ORDER — FENTANYL CITRATE (PF) 100 MCG/2ML IJ SOLN
25.0000 ug | INTRAMUSCULAR | Status: DC | PRN
  Administered 2016-11-15 (×3): 50 ug via INTRAVENOUS

## 2016-11-15 MED ORDER — HYDROMORPHONE 1 MG/ML IV SOLN
INTRAVENOUS | Status: DC
  Administered 2016-11-15: 13:00:00 via INTRAVENOUS
  Administered 2016-11-15: 3.3 mg via INTRAVENOUS
  Administered 2016-11-15: 2.4 mg via INTRAVENOUS
  Administered 2016-11-16: 3.3 mg via INTRAVENOUS
  Administered 2016-11-16: 2.4 mg via INTRAVENOUS
  Filled 2016-11-15: qty 25

## 2016-11-15 MED ORDER — EPHEDRINE SULFATE 50 MG/ML IJ SOLN
INTRAMUSCULAR | Status: DC | PRN
  Administered 2016-11-15 (×2): 10 mg via INTRAVENOUS

## 2016-11-15 MED ORDER — LIDOCAINE 2% (20 MG/ML) 5 ML SYRINGE
INTRAMUSCULAR | Status: AC
  Filled 2016-11-15: qty 5

## 2016-11-15 MED ORDER — PROPOFOL 10 MG/ML IV BOLUS
INTRAVENOUS | Status: AC
  Filled 2016-11-15: qty 20

## 2016-11-15 MED ORDER — NALOXONE HCL 0.4 MG/ML IJ SOLN: 0.4000 mg | INTRAMUSCULAR | Status: DC | PRN

## 2016-11-15 MED ORDER — CLINDAMYCIN PHOSPHATE 600 MG/50ML IV SOLN
600.0000 mg | Freq: Four times a day (QID) | INTRAVENOUS | Status: AC
  Administered 2016-11-15 – 2016-11-16 (×3): 600 mg via INTRAVENOUS
  Filled 2016-11-15 (×3): qty 50

## 2016-11-15 MED ORDER — EPHEDRINE 5 MG/ML INJ
INTRAVENOUS | Status: AC
  Filled 2016-11-15: qty 10

## 2016-11-15 SURGICAL SUPPLY — 95 items
BANDAGE ACE 4X5 VEL STRL LF (GAUZE/BANDAGES/DRESSINGS) ×2 IMPLANT
BANDAGE ACE 6X5 VEL STRL LF (GAUZE/BANDAGES/DRESSINGS) ×2 IMPLANT
BANDAGE ELASTIC 4 VELCRO ST LF (GAUZE/BANDAGES/DRESSINGS) ×1 IMPLANT
BANDAGE ELASTIC 6 VELCRO ST LF (GAUZE/BANDAGES/DRESSINGS) ×1 IMPLANT
BANDAGE ESMARK 6X9 LF (GAUZE/BANDAGES/DRESSINGS) ×1 IMPLANT
BIT DRILL CALIBRATED 4.3X320MM (BIT) IMPLANT
BNDG CMPR 9X6 STRL LF SNTH (GAUZE/BANDAGES/DRESSINGS) ×1
BNDG COHESIVE 6X5 TAN STRL LF (GAUZE/BANDAGES/DRESSINGS) ×2 IMPLANT
BNDG ESMARK 6X9 LF (GAUZE/BANDAGES/DRESSINGS) ×2
BNDG GAUZE ELAST 4 BULKY (GAUZE/BANDAGES/DRESSINGS) ×3 IMPLANT
BRUSH SCRUB SURG 4.25 DISP (MISCELLANEOUS) ×4 IMPLANT
BUR EGG ELITE 4.0 (BURR) ×2 IMPLANT
CATH FOLEY LATEX FREE 16FR (CATHETERS) ×2
CATH FOLEY LF 16FR (CATHETERS) IMPLANT
COVER LIGHT HANDLE STERIS (MISCELLANEOUS) ×1 IMPLANT
COVER SURGICAL LIGHT HANDLE (MISCELLANEOUS) ×4 IMPLANT
CUFF TOURNIQUET SINGLE 18IN (TOURNIQUET CUFF) IMPLANT
CUFF TOURNIQUET SINGLE 24IN (TOURNIQUET CUFF) ×1 IMPLANT
DRAPE C-ARM 42X72 X-RAY (DRAPES) ×1 IMPLANT
DRAPE C-ARMOR (DRAPES) ×2 IMPLANT
DRAPE U-SHAPE 47X51 STRL (DRAPES) ×2 IMPLANT
DRILL CALIBRATED 4.3X320MM (BIT) ×2
DRSG ADAPTIC 3X8 NADH LF (GAUZE/BANDAGES/DRESSINGS) ×1 IMPLANT
DRSG MEPILEX BORDER 4X8 (GAUZE/BANDAGES/DRESSINGS) ×1 IMPLANT
DRSG MEPITEL 4X7.2 (GAUZE/BANDAGES/DRESSINGS) ×1 IMPLANT
DRSG PAD ABDOMINAL 8X10 ST (GAUZE/BANDAGES/DRESSINGS) ×2 IMPLANT
ELECT REM PT RETURN 9FT ADLT (ELECTROSURGICAL) ×2
ELECTRODE REM PT RTRN 9FT ADLT (ELECTROSURGICAL) ×1 IMPLANT
GAUZE SPONGE 4X4 12PLY STRL (GAUZE/BANDAGES/DRESSINGS) ×2 IMPLANT
GLOVE BIO SURGEON STRL SZ7.5 (GLOVE) ×1 IMPLANT
GLOVE BIO SURGEON STRL SZ8 (GLOVE) ×1 IMPLANT
GLOVE BIOGEL PI IND STRL 6.5 (GLOVE) IMPLANT
GLOVE BIOGEL PI IND STRL 7.5 (GLOVE) ×1 IMPLANT
GLOVE BIOGEL PI IND STRL 8 (GLOVE) ×1 IMPLANT
GLOVE BIOGEL PI INDICATOR 6.5 (GLOVE) ×1
GLOVE BIOGEL PI INDICATOR 7.5 (GLOVE) ×1
GLOVE BIOGEL PI INDICATOR 8 (GLOVE) ×1
GLOVE SURG SS PI 6.5 STRL IVOR (GLOVE) ×3 IMPLANT
GLOVE SURG SS PI 7.5 STRL IVOR (GLOVE) ×2 IMPLANT
GLOVE SURG SS PI 8.0 STRL IVOR (GLOVE) ×2 IMPLANT
GOWN STRL REUS W/ TWL LRG LVL3 (GOWN DISPOSABLE) ×2 IMPLANT
GOWN STRL REUS W/ TWL XL LVL3 (GOWN DISPOSABLE) ×1 IMPLANT
GOWN STRL REUS W/TWL LRG LVL3 (GOWN DISPOSABLE) ×4
GOWN STRL REUS W/TWL XL LVL3 (GOWN DISPOSABLE) ×8
GUIDEWIRE 2.6X80 BEAD TIP (WIRE) IMPLANT
GUIDWIRE 2.6X80 BEAD TIP (WIRE) ×2
KIT BASIN OR (CUSTOM PROCEDURE TRAY) ×2 IMPLANT
KIT ROOM TURNOVER OR (KITS) ×2 IMPLANT
MANIFOLD NEPTUNE II (INSTRUMENTS) ×2 IMPLANT
NAIL LOCK ANKLE ANTE 11X210 (Nail) ×1 IMPLANT
NEEDLE 22X1 1/2 (OR ONLY) (NEEDLE) IMPLANT
NS IRRIG 1000ML POUR BTL (IV SOLUTION) ×2 IMPLANT
PACK ORTHO EXTREMITY (CUSTOM PROCEDURE TRAY) ×2 IMPLANT
PAD ARMBOARD 7.5X6 YLW CONV (MISCELLANEOUS) ×4 IMPLANT
PAD CAST 4YDX4 CTTN HI CHSV (CAST SUPPLIES) IMPLANT
PADDING CAST ABS 4INX4YD NS (CAST SUPPLIES) ×2
PADDING CAST ABS 6INX4YD NS (CAST SUPPLIES) ×1
PADDING CAST ABS COTTON 4X4 ST (CAST SUPPLIES) IMPLANT
PADDING CAST ABS COTTON 6X4 NS (CAST SUPPLIES) IMPLANT
PADDING CAST COTTON 4X4 STRL (CAST SUPPLIES) ×2
PADDING CAST COTTON 6X4 STRL (CAST SUPPLIES) ×4 IMPLANT
PENCIL BUTTON BLDE SNGL 10FT (ELECTRODE) ×1 IMPLANT
PIN GUIDE 3.2 903003004 (MISCELLANEOUS) ×1 IMPLANT
SCREW CORT TI DBL LEAD 5X24 (Screw) ×2 IMPLANT
SCREW CORT TI DBL LEAD 5X38 (Screw) ×1 IMPLANT
SCREW CORT TI DBL LEAD 5X60 (Screw) ×1 IMPLANT
SCRUB BETADINE 4OZ XXX (MISCELLANEOUS) ×2 IMPLANT
SOLUTION BETADINE 4OZ (MISCELLANEOUS) ×2 IMPLANT
SPLINT PLASTER CAST XFAST 5X30 (CAST SUPPLIES) IMPLANT
SPLINT PLASTER XFAST SET 5X30 (CAST SUPPLIES) ×1
SPONGE LAP 18X18 X RAY DECT (DISPOSABLE) ×3 IMPLANT
SPONGE SURGIFOAM ABS GEL 100 (HEMOSTASIS) ×1 IMPLANT
SPONGE SURGIFOAM ABS GEL 12-7 (HEMOSTASIS) IMPLANT
STAPLER VISISTAT 35W (STAPLE) IMPLANT
STOCKINETTE IMPERVIOUS LG (DRAPES) ×2 IMPLANT
STRIP CLOSURE SKIN 1/2X4 (GAUZE/BANDAGES/DRESSINGS) IMPLANT
SUCTION FRAZIER HANDLE 10FR (MISCELLANEOUS)
SUCTION TUBE FRAZIER 10FR DISP (MISCELLANEOUS) IMPLANT
SUT ETHILON 2 0 FS 18 (SUTURE) ×1 IMPLANT
SUT ETHILON 3 0 PS 1 (SUTURE) ×2 IMPLANT
SUT PDS AB 2-0 CT1 27 (SUTURE) IMPLANT
SUT PROLENE 5 0 C 1 36 (SUTURE) ×1 IMPLANT
SUT VIC AB 0 CT1 27 (SUTURE)
SUT VIC AB 0 CT1 27XBRD ANBCTR (SUTURE) IMPLANT
SUT VIC AB 1 CT1 27 (SUTURE) ×2
SUT VIC AB 1 CT1 27XBRD ANBCTR (SUTURE) IMPLANT
SUT VIC AB 2-0 CT1 27 (SUTURE)
SUT VIC AB 2-0 CT1 TAPERPNT 27 (SUTURE) IMPLANT
SYR CONTROL 10ML LL (SYRINGE) IMPLANT
TOWEL OR 17X24 6PK STRL BLUE (TOWEL DISPOSABLE) ×4 IMPLANT
TOWEL OR 17X26 10 PK STRL BLUE (TOWEL DISPOSABLE) ×4 IMPLANT
TUBE CONNECTING 12X1/4 (SUCTIONS) ×2 IMPLANT
UNDERPAD 30X30 (UNDERPADS AND DIAPERS) ×3 IMPLANT
WATER STERILE IRR 1000ML POUR (IV SOLUTION) ×4 IMPLANT
YANKAUER SUCT BULB TIP NO VENT (SUCTIONS) ×3 IMPLANT

## 2016-11-15 NOTE — Transfer of Care (Signed)
Immediate Anesthesia Transfer of Care Note  Patient: [name]  Procedure(s) Performed: Procedure(s): ARTHRODESIS ANKLE WITH ILIAC CREST BONE GRAFT LEFT (Left) HARDWARE REMOVAL LEFT ANKLE (Left)  Patient Location: PACU  Anesthesia Type:General  Level of Consciousness: awake  Airway & Oxygen Therapy: Patient Spontanous Breathing and Patient connected to nasal cannula oxygen  Post-op Assessment: Report given to RN and Post -op Vital signs reviewed and stable  Post vital signs: Reviewed and stable  Last Vitals:  Vitals:   11/15/16 0652 11/15/16 1250  BP: (!) 159/90 (!) 146/82  Pulse:  (!) 105  Resp:  15  Temp:  36.1 C    Last Pain:  Vitals:   11/15/16 1250  TempSrc:   PainSc: Asleep         Complications: No apparent anesthesia complications

## 2016-11-15 NOTE — Anesthesia Procedure Notes (Signed)
Procedure Name: Intubation Date/Time: 11/15/2016 8:11 AM Performed by: Manuela Schwartz B Pre-anesthesia Checklist: Patient identified, Emergency Drugs available, Suction available, Patient being monitored and Timeout performed Patient Re-evaluated:Patient Re-evaluated prior to inductionOxygen Delivery Method: Circle system utilized Preoxygenation: Pre-oxygenation with 100% oxygen Intubation Type: IV induction Ventilation: Mask ventilation without difficulty Laryngoscope Size: Mac and 4 Grade View: Grade I Tube type: Oral Tube size: 7.5 mm Number of attempts: 1 Airway Equipment and Method: Stylet Placement Confirmation: ETT inserted through vocal cords under direct vision,  positive ETCO2 and breath sounds checked- equal and bilateral Secured at: 23 cm Tube secured with: Tape Dental Injury: Teeth and Oropharynx as per pre-operative assessment

## 2016-11-15 NOTE — Anesthesia Preprocedure Evaluation (Signed)
Anesthesia Evaluation  Patient identified by MRN, date of birth, ID band Patient awake    Reviewed: Allergy & Precautions, H&P , NPO status , Patient's Chart, lab work & pertinent test results, reviewed documented beta blocker date and time   Airway Mallampati: II  TM Distance: >3 FB Neck ROM: full    Dental no notable dental hx. (+) Missing, Dental Advisory Given, Partial Upper,    Pulmonary sleep apnea and Continuous Positive Airway Pressure Ventilation , Current Smoker,    Pulmonary exam normal breath sounds clear to auscultation       Cardiovascular hypertension, Pt. on medications + Peripheral Vascular Disease   Rhythm:regular Rate:Normal     Neuro/Psych PSYCHIATRIC DISORDERS Depression    GI/Hepatic GERD  ,(+)     substance abuse  alcohol use and cocaine use,   Endo/Other    Renal/GU      Musculoskeletal  (+) Arthritis ,   Abdominal   Peds  Hematology   Anesthesia Other Findings Chronic back pain RSD HTN EtOH   Reproductive/Obstetrics                             Anesthesia Physical Anesthesia Plan  ASA: II  Anesthesia Plan: General   Post-op Pain Management:    Induction: Intravenous  Airway Management Planned: Oral ETT  Additional Equipment:   Intra-op Plan:   Post-operative Plan: Extubation in OR  Informed Consent: I have reviewed the patients History and Physical, chart, labs and discussed the procedure including the risks, benefits and alternatives for the proposed anesthesia with the patient or authorized representative who has indicated his/her understanding and acceptance.   Dental advisory given  Plan Discussed with: Anesthesiologist and Surgeon  Anesthesia Plan Comments:         Anesthesia Quick Evaluation

## 2016-11-15 NOTE — Progress Notes (Signed)
Orthopedic Trauma Service Progress Note post op check   Subjective:   Doing fair post op L ICBG donor site > pain than fusion site   Utilizing PCA with good result Very hungry, food on way up    ROS As above   Objective:   VITALS:   Vitals:   11/15/16 1345 11/15/16 1408 11/15/16 1557 11/15/16 1600  BP:  129/86    Pulse:  89    Resp:  20 11 11   Temp: 98 F (36.7 C) 97.5 F (36.4 C)    TempSrc:  Oral    SpO2:  94% 94% 94%  Weight:        Intake/Output      04/30 0701 - 05/01 0700 05/01 0701 - 05/02 0700   I.V. 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 11/15/16 1610 11/15/16 0815    .  POD/HD#: 0   - L ankle fusion nonunion s/p revision with ICBG  NWB x 8 weeks  PT/OT tomorrow  Aggressive Ice and elevation   - pain management   PCA  norco ok with pca  Oxy IR for breakthrough pain   - HTN  Monitor   - FEN  IVF  Monitor renal function   - Dispo  Therapies in am   Possible dc home tomorrow but may require additional day for pain control     Jari Pigg, PA-C [facility] [phone] (P) [phone] (O) 11/15/2016, 4:11 PM

## 2016-11-15 NOTE — Anesthesia Postprocedure Evaluation (Signed)
Anesthesia Post Note  Patient: [name]  Procedure(s) Performed: Procedure(s) (LRB): ARTHRODESIS ANKLE WITH ILIAC CREST BONE GRAFT LEFT (Left) HARDWARE REMOVAL LEFT ANKLE (Left)  Patient location during evaluation: PACU Anesthesia Type: General Level of consciousness: awake Pain management: pain level controlled Vital Signs Assessment: post-procedure vital signs reviewed and stable Respiratory status: spontaneous breathing Cardiovascular status: stable Anesthetic complications: no       Last Vitals:  Vitals:   11/15/16 1557 11/15/16 1600  BP:    Pulse:    Resp: 11 11  Temp:      Last Pain:  Vitals:   11/15/16 1600  TempSrc:   PainSc: 2                  Meara Wiechman

## 2016-11-15 NOTE — Evaluation (Signed)
Physical Therapy Evaluation Patient Details Name: [name] MRN: [id number] DOB: 1966/01/02 Today's Date: 11/15/2016   History of Present Illness  Pt is a 51 yo male with L ankle pain secondary to nonunion of L ankle fusion, s/p athrodesis of L ankle with illiac crest bone graft  Clinical Impression  Patient is s/p above surgery resulting in functional limitations due to the deficits listed below (see PT Problem List). Pt is severely limited by pain in L hip and ankle post op. Pt supine to sit with modA and increase in pain to 10/10. Pt unable to progress to transfers due to pain. Patient will benefit from skilled PT to increase their independence and safety with mobility to allow discharge to the venue listed below.       Follow Up Recommendations SNF;Supervision/Assistance - 24 hour    Equipment Recommendations  Rolling walker with 5" wheels;3in1 (PT)    Recommendations for Other Services OT consult     Precautions / Restrictions Precautions Precautions: Fall Restrictions Weight Bearing Restrictions: Yes LLE Weight Bearing: Non weight bearing      Mobility  Bed Mobility Overal bed mobility: Needs Assistance Bed Mobility: Supine to Sit     Supine to sit: Mod assist     General bed mobility comments: Pt required modA for LE mangement to floor and back to bed, movement created 10/10 pain in hip and ankle. Pt able to dangle legs EoB for 3 minutes before pain increased.               Balance Overall balance assessment: Needs assistance Sitting-balance support: Feet supported;Bilateral upper extremity supported Sitting balance-Leahy Scale: Poor Sitting balance - Comments: pt able to sit EoB 3 minutes 1 minute without min PT support                                     Pertinent Vitals/Pain Pain Assessment: 0-10 Pain Score: 10-Worst pain ever Pain Location: L hip and ankle Pain Descriptors / Indicators: Sharp;Constant Pain Intervention(s): Limited  activity within patient's tolerance;Monitored during session  4L of O2 via nasal cannula throughout session, SaO2 >90%O2 throughout session, at rest pt HR 65 bpm coming supine to sit HR rose to 118bpm    Home Living Family/patient expects to be discharged to:: Private residence Living Arrangements: Parent Available Help at Discharge: Family;Available 24 hours/day Type of Home: House Home Access: Ramped entrance     Home Layout: One level Home Equipment: Walker - 2 wheels      Prior Function Level of Independence: Independent         Comments: able to ambulated without assistive device but with difficulty in store or longer distances. drive,      Hand Dominance        Extremity/Trunk Assessment   Upper Extremity Assessment Upper Extremity Assessment: Defer to OT evaluation    Lower Extremity Assessment Lower Extremity Assessment: RLE deficits/detail RLE Deficits / Details: surgical pain reduced ROM and strength RLE: Unable to fully assess due to pain RLE Sensation: decreased light touch (from mid-thigh to toes)       Communication   Communication: No difficulties  Cognition Arousal/Alertness: Awake/alert Behavior During Therapy: WFL for tasks assessed/performed Overall Cognitive Status: Within Functional Limits for tasks assessed  General Comments General comments (skin integrity, edema, etc.): Pt mother in room throughout session. Pt lmobility severely limited by pain.        Assessment/Plan    PT Assessment Patient needs continued PT services  PT Problem List Decreased strength;Decreased range of motion;Decreased activity tolerance;Decreased balance;Decreased mobility;Decreased coordination;Decreased knowledge of use of DME;Decreased safety awareness;Decreased knowledge of precautions;Pain       PT Treatment Interventions DME instruction;Gait training;Functional mobility training;Therapeutic  activities;Therapeutic exercise;Balance training;Patient/family education    PT Goals (Current goals can be found in the Care Plan section)  Acute Rehab PT Goals Patient Stated Goal: have less pain PT Goal Formulation: With patient Time For Goal Achievement: 11/22/16 Potential to Achieve Goals: Fair    Frequency Min 5X/week   Barriers to discharge Other (comment) (pt only able to come EOB secondary to pain)         AM-PAC PT "6 Clicks" Daily Activity  Outcome Measure Difficulty turning over in bed (including adjusting bedclothes, sheets and blankets)?: Total Difficulty moving from lying on back to sitting on the side of the bed? : Total Difficulty sitting down on and standing up from a chair with arms (e.g., wheelchair, bedside commode, etc,.)?: Total Help needed moving to and from a bed to chair (including a wheelchair)?: Total Help needed walking in hospital room?: Total Help needed climbing 3-5 steps with a railing? : Total 6 Click Score: 6    End of Session Equipment Utilized During Treatment: Oxygen Activity Tolerance: Patient limited by pain Patient left: in bed;with call bell/phone within reach;with family/visitor present;with SCD's reapplied Nurse Communication: Mobility status;Weight bearing status PT Visit Diagnosis: Other abnormalities of gait and mobility (R26.89);Muscle weakness (generalized) (M62.81);Pain Pain - Right/Left: Left Pain - part of body: Hip;Ankle and joints of foot    Time: 4585-9292 PT Time Calculation (min) (ACUTE ONLY): 45 min   Charges:   PT Evaluation $PT Eval Low Complexity: 1 Procedure PT Treatments $Therapeutic Activity: 23-37 mins   PT G Codes:        Aoife Bold B. Migdalia Dk PT, DPT Acute Rehabilitation  [phone] Pager [phone]    [place] 11/15/2016, 5:04 PM

## 2016-11-16 ENCOUNTER — Encounter (HOSPITAL_COMMUNITY): Payer: Self-pay | Admitting: Orthopedic Surgery

## 2016-11-16 DIAGNOSIS — M960 Pseudarthrosis after fusion or arthrodesis: Secondary | ICD-10-CM | POA: Diagnosis not present

## 2016-11-16 LAB — CBC
HCT: 38 % — ABNORMAL LOW (ref 39.0–52.0)
Hemoglobin: 12.8 g/dL — ABNORMAL LOW (ref 13.0–17.0)
MCH: 29.6 pg (ref 26.0–34.0)
MCHC: 33.7 g/dL (ref 30.0–36.0)
MCV: 87.8 fL (ref 78.0–100.0)
Platelets: 236 10*3/uL (ref 150–400)
RBC: 4.33 MIL/uL (ref 4.22–5.81)
RDW: 13.9 % (ref 11.5–15.5)
WBC: 14.2 10*3/uL — ABNORMAL HIGH (ref 4.0–10.5)

## 2016-11-16 LAB — BASIC METABOLIC PANEL
Anion gap: 10 (ref 5–15)
BUN: 31 mg/dL — ABNORMAL HIGH (ref 6–20)
CO2: 24 mmol/L (ref 22–32)
Calcium: 8.4 mg/dL — ABNORMAL LOW (ref 8.9–10.3)
Chloride: 99 mmol/L — ABNORMAL LOW (ref 101–111)
Creatinine, Ser: 1.2 mg/dL (ref 0.61–1.24)
GFR calc Af Amer: >60 mL/min (ref >60)
GFR calc non Af Amer: >60 mL/min (ref >60)
Glucose, Bld: 159 mg/dL — ABNORMAL HIGH (ref 65–99)
Potassium: 4 mmol/L (ref 3.5–5.1)
Sodium: 133 mmol/L — ABNORMAL LOW (ref 135–145)

## 2016-11-16 MED ORDER — OXYCODONE HCL 5 MG PO TABS: 5.0000 mg | ORAL_TABLET | Freq: Four times a day (QID) | ORAL | 0 refills | Status: DC | PRN

## 2016-11-16 MED ORDER — METHOCARBAMOL 500 MG PO TABS: 500.0000 mg | ORAL_TABLET | Freq: Four times a day (QID) | ORAL | 0 refills | Status: DC | PRN

## 2016-11-16 MED ORDER — OXYCODONE-ACETAMINOPHEN 5-325 MG PO TABS: 1.0000 | ORAL_TABLET | Freq: Four times a day (QID) | ORAL | Status: DC | PRN

## 2016-11-16 MED ORDER — DOCUSATE SODIUM 100 MG PO CAPS: 100.0000 mg | ORAL_CAPSULE | Freq: Two times a day (BID) | ORAL | 0 refills | Status: DC

## 2016-11-16 MED ORDER — OXYCODONE-ACETAMINOPHEN 5-325 MG PO TABS: 1.0000 | ORAL_TABLET | Freq: Four times a day (QID) | ORAL | 0 refills | Status: DC | PRN

## 2016-11-16 NOTE — Progress Notes (Signed)
Orthopedic Trauma Service Progress Note    Subjective:  Doing ok Much better this am than he was yesterday afternoon when he was seen   Wants to go home today Foley removed around 0700, has yet to void w/o foley   No other complaints    Review of Systems  Constitutional: Negative for chills and fever.  Respiratory: Negative for shortness of breath and wheezing.   Cardiovascular: Negative for chest pain and palpitations.  Gastrointestinal: Negative for abdominal pain, nausea and vomiting.    Objective:   VITALS:   Vitals:   11/15/16 1600 11/15/16 2000 11/16/16 0047 11/16/16 0500  BP:    (!) 168/98  Pulse:    98  Resp: 11 11 15    Temp:    98.1 F (36.7 C)  TempSrc:    Oral  SpO2: 94% 94% 95% 96%  Weight:        Intake/Output      05/01 0701 - 05/02 0700 05/02 0701 - 05/03 0700   I.V. (mL/kg) 3000 (27.2)    Total Intake(mL/kg) 3000 (27.2)    Urine (mL/kg/hr) 300 (0.1)    Blood 150 (0.1)    Total Output 450     Net +2550            LABS  Results for orders placed or performed during the hospital encounter of 11/15/16 (from the past 24 hour(s))  Aerobic/Anaerobic Culture (surgical/deep wound)     Status: None (Preliminary result)   Collection Time: 11/15/16 11:14 AM  Result Value Ref Range   Specimen Description WOUND LEFT ANKLE    Special Requests NONE    Gram Stain      RARE WBC PRESENT,BOTH PMN AND MONONUCLEAR NO ORGANISMS SEEN    Culture PENDING    Report Status PENDING   CBC     Status: Abnormal   Collection Time: 11/15/16  2:38 PM  Result Value Ref Range   WBC 11.6 (H) 4.0 - 10.5 K/uL   RBC 4.66 4.22 - 5.81 MIL/uL   Hemoglobin 13.8 13.0 - 17.0 g/dL   HCT 40.4 39.0 - 52.0 %   MCV 86.7 78.0 - 100.0 fL   MCH 29.6 26.0 - 34.0 pg   MCHC 34.2 30.0 - 36.0 g/dL   RDW 13.7 11.5 - 15.5 %   Platelets 201 150 - 400 K/uL  Creatinine, serum     Status: None   Collection Time: 11/15/16  2:38 PM  Result Value Ref Range   Creatinine, Ser 1.12 0.61 - 1.24 mg/dL   GFR calc non Af Amer >60 >60 mL/min   GFR calc Af Amer >60 >60 mL/min  Glucose, capillary     Status: Abnormal   Collection Time: 11/15/16  4:20 PM  Result Value Ref Range   Glucose-Capillary 224 (H) 65 - 99 mg/dL  CBC     Status: Abnormal   Collection Time: 11/16/16  5:41 AM  Result Value Ref Range   WBC 14.2 (H) 4.0 - 10.5 K/uL   RBC 4.33 4.22 - 5.81 MIL/uL   Hemoglobin 12.8 (L) 13.0 - 17.0 g/dL   HCT 38.0 (L) 39.0 - 52.0 %   MCV 87.8 78.0 - 100.0 fL   MCH 29.6 26.0 - 34.0 pg   MCHC 33.7 30.0 - 36.0 g/dL   RDW 13.9 11.5 - 15.5 %   Platelets 236 150 - 400 K/uL  Basic metabolic panel     Status: Abnormal   Collection Time: 11/16/16  5:41 AM  Result  Value Ref Range   Sodium 133 (L) 135 - 145 mmol/L   Potassium 4.0 3.5 - 5.1 mmol/L   Chloride 99 (L) 101 - 111 mmol/L   CO2 24 22 - 32 mmol/L   Glucose, Bld 159 (H) 65 - 99 mg/dL   BUN 31 (H) 6 - 20 mg/dL   Creatinine, Ser 1.20 0.61 - 1.24 mg/dL   Calcium 8.4 (L) 8.9 - 10.3 mg/dL   GFR calc non Af Amer >60 >60 mL/min   GFR calc Af Amer >60 >60 mL/min   Anion gap 10 5 - 15     PHYSICAL EXAM:   Gen: awake and alert, NAD, appears more comfortable  Lungs: clear anterior fields Cardiac: RRR, s1 and s2 Ext:       Left Lower Extremity   SLS c/d/I  Dressing L hip stable  Distal motor and sensory functions intact   Ext warm    Swelling stable  Assessment/Plan: 1 Day Post-Op   Principal Problem:   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 11/15/16 7530 11/15/16 0815    .  POD/HD#: 1  51 y/o male s/p L ankle fusion nonunion revision   - L ankle fusion nonunion s/p revision with ICBG             NWB x 8 weeks  Splint x 2 weeks              PT/OT              Aggressive Ice and elevation   Dc home today    - L ICBG harvest  Dressing change in 2 days  Can clean wound with soap and water when dressing is changed    - pain management              dc pca  Percocet and robaxin at dc   Oxy IR for breakthrough pain    - HTN             resume home meds at dc    - FEN             reg diet  Dc IVF   - Dispo             dc home today   Follow up in 2 weeks at office   Jari Pigg, PA-C [facility] [phone] (P) [phone] (O) 11/16/2016, 8:21 AM

## 2016-11-16 NOTE — Care Management (Signed)
Patient has no HH or DME needs.

## 2016-11-16 NOTE — Evaluation (Signed)
Occupational Therapy Evaluation Patient Details Name: [name] MRN: [id number] DOB: 22-Feb-1966 Today's Date: 11/16/2016    History of Present Illness Pt is a 51 yo male with L ankle pain secondary to nonunion of L ankle fusion, s/p athrodesis of L ankle with illiac crest bone graft   Clinical Impression   PTA, pt was independent with ADL and functional mobility. Pt currently requires min guard assist for LB ADL and toilet transfers. Educated pt on safety with L LE NWB status during ADL and he verbalizes understanding. Pt will have 24 hour assistance from his mother post-acute D/C. Pt would benefit from continued OT services to improve independence with ADL in preparation for D/C home with no OT follow-up and 24 hour assistance. OT will continue to follow while admitted.     Follow Up Recommendations  No OT follow up;Supervision/Assistance - 24 hour    Equipment Recommendations  None recommended by OT    Recommendations for Other Services       Precautions / Restrictions Precautions Precautions: Fall Restrictions Weight Bearing Restrictions: Yes LLE Weight Bearing: Non weight bearing      Mobility Bed Mobility Overal bed mobility: Needs Assistance Bed Mobility: Supine to Sit     Supine to sit: Supervision     General bed mobility comments: Supervision for safety.  Transfers Overall transfer level: Needs assistance Equipment used: Rolling walker (2 wheeled) Transfers: Sit to/from Stand Sit to Stand: Min guard         General transfer comment: Min guard assist and VC's for safe use of RW and hand placement.    Balance Overall balance assessment: Needs assistance Sitting-balance support: Feet supported;No upper extremity supported Sitting balance-Leahy Scale: Good     Standing balance support: Bilateral upper extremity supported;During functional activity Standing balance-Leahy Scale: Poor Standing balance comment: Reliant on RW                            ADL either performed or assessed with clinical judgement   ADL Overall ADL's : Needs assistance/impaired Eating/Feeding: Set up;Sitting   Grooming: Set up;Sitting   Upper Body Bathing: Set up;Sitting   Lower Body Bathing: Min guard;Sit to/from stand   Upper Body Dressing : Set up;Sitting   Lower Body Dressing: Min guard;Sit to/from stand   Toilet Transfer: Min guard;Ambulation;Regular Toilet;RW   Toileting- Water quality scientist and Hygiene: Min guard;Sit to/from stand       Functional mobility during ADLs: Min guard;Rolling walker General ADL Comments: Pt educated concerning NWB L LE status during ADL and safety/fall prevention with ADL.     Vision   Vision Assessment?: No apparent visual deficits     Perception     Praxis      Pertinent Vitals/Pain Pain Assessment: 0-10 Pain Score: 2  Pain Location: L hip and ankle Pain Descriptors / Indicators: Sharp;Constant Pain Intervention(s): Limited activity within patient's tolerance;Monitored during session     Hand Dominance Right   Extremity/Trunk Assessment Upper Extremity Assessment Upper Extremity Assessment: Overall WFL for tasks assessed   Lower Extremity Assessment Lower Extremity Assessment: LLE deficits/detail LLE Deficits / Details: Decreased strength and ROM as expected post-operatively.       Communication Communication Communication: No difficulties   Cognition Arousal/Alertness: Awake/alert Behavior During Therapy: WFL for tasks assessed/performed Overall Cognitive Status: Within Functional Limits for tasks assessed  General Comments  SpO2 90-95% on RA    Exercises     Shoulder Instructions      Home Living Family/patient expects to be discharged to:: Private residence Living Arrangements: Parent Available Help at Discharge: Family;Available 24 hours/day Type of Home: House Home Access: Ramped entrance     Home Layout:  One level     Bathroom Shower/Tub: Tub/shower unit;Curtain   Bathroom Toilet: Handicapped height Bathroom Accessibility: Yes   Home Equipment: Walker - 2 wheels;Grab bars - tub/shower;Hand held shower head;Wheelchair - manual;Tub bench          Prior Functioning/Environment Level of Independence: Independent        Comments: 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        OT Problem List: Decreased strength;Decreased activity tolerance;Impaired balance (sitting and/or standing);Decreased safety awareness;Decreased knowledge of use of DME or AE;Decreased knowledge of precautions;Pain      OT Treatment/Interventions: Self-care/ADL training;Therapeutic exercise;Energy conservation;DME and/or AE instruction;Therapeutic activities;Patient/family education;Balance training    OT Goals(Current goals can be found in the care plan section) Acute Rehab OT Goals Patient Stated Goal: have less pain OT Goal Formulation: With patient Time For Goal Achievement: 11/30/16 Potential to Achieve Goals: Good ADL Goals Pt Will Perform Lower Body Dressing: with modified independence;sit to/from stand Pt Will Transfer to Toilet: with modified independence;ambulating;regular height toilet Pt Will Perform Toileting - Clothing Manipulation and hygiene: with modified independence;sit to/from stand Pt Will Perform Tub/Shower Transfer: with modified independence;Tub transfer;tub bench;rolling walker  OT Frequency: Min 1X/week   Barriers to D/C:            Co-evaluation              AM-PAC PT "6 Clicks" Daily Activity     Outcome Measure Help from another person eating meals?: None Help from another person taking care of personal grooming?: None Help from another person toileting, which includes using toliet, bedpan, or urinal?: A Little Help from another person bathing (including washing, rinsing, drying)?: A Little Help from another person to put on and  taking off regular upper body clothing?: None Help from another person to put on and taking off regular lower body clothing?: A Little 6 Click Score: 21   End of Session Equipment Utilized During Treatment: Rolling walker  Activity Tolerance: Patient tolerated treatment well Patient left: in chair;with call bell/phone within reach  OT Visit Diagnosis: Other abnormalities of gait and mobility (R26.89);Pain Pain - Right/Left: Left Pain - part of body: Leg                Time: 1005-1026 OT Time Calculation (min): 21 min Charges:  OT General Charges $OT Visit: 1 Procedure OT Evaluation $OT Eval Moderate Complexity: 1 Procedure G-Codes: OT G-codes **NOT FOR INPATIENT CLASS** Functional Assessment Tool Used: Clinical judgement Functional Limitation: Self care Self Care Current Status (W5462): At least 1 percent but less than 20 percent impaired, limited or restricted Self Care Goal Status (V0350): At least 1 percent but less than 20 percent impaired, limited or restricted   Norman Herrlich, MS OTR/L  Pager: [phone] 11/16/2016, 10:59 AM

## 2016-11-16 NOTE — Discharge Instructions (Signed)
Orthopaedic Trauma Service Discharge Instructions   General Discharge Instructions  WEIGHT BEARING STATUS: Nonweightbearing left leg  RANGE OF MOTION/ACTIVITY: you can not move your ankle or foot as you have had a fusion and are in a splint. Ok to move toes, knee and hip   Wound Care:    Do not remove splint on left ankle. Do not get splint wet     Can start dressing changes to L hip on 11/18/2016. Ok to clean with soap and water only. If dry can leave open to air, otherwise put dry dressing over the top of it   Discharge Wound Care Instructions  Do NOT apply any ointments, solutions or lotions to pin sites or surgical wounds.  These prevent needed drainage and even though solutions like hydrogen peroxide kill bacteria, they also damage cells lining the pin sites that help fight infection.  Applying lotions or ointments can keep the wounds moist and can cause them to breakdown and open up as well. This can increase the risk for infection. When in doubt call the office.  Surgical incisions should be dressed daily.  If any drainage is noted, use one layer of adaptic, then gauze, Kerlix, and an ace wrap.  Once the incision is completely dry and without drainage, it may be left open to air out.  Showering may begin 36-48 hours later.  Cleaning gently with soap and water.  Traumatic wounds should be dressed daily as well.    One layer of adaptic, gauze, Kerlix, then ace wrap.  The adaptic can be discontinued once the draining has ceased    If you have a wet to dry dressing: wet the gauze with saline the squeeze as much saline out so the gauze is moist (not soaking wet), place moistened gauze over wound, then place a dry gauze over the moist one, followed by Kerlix wrap, then ace wrap.  PAIN MEDICATION USE AND EXPECTATIONS  You have likely been given narcotic medications to help control your pain.  After a traumatic event that results in an fracture (broken bone) with or without surgery, it is  ok to use narcotic pain medications to help control one's pain.  We understand that everyone responds to pain differently and each individual patient will be evaluated on a regular basis for the continued need for narcotic medications. Ideally, narcotic medication use should last no more than 6-8 weeks (coinciding with fracture healing).   As a patient it is your responsibility as well to monitor narcotic medication use and report the amount and frequency you use these medications when you come to your office visit.   We would also advise that if you are using narcotic medications, you should take a dose prior to therapy to maximize you participation.  IF YOU ARE ON NARCOTIC MEDICATIONS IT IS NOT PERMISSIBLE TO OPERATE A MOTOR VEHICLE (MOTORCYCLE/CAR/TRUCK/MOPED) OR HEAVY MACHINERY DO NOT MIX NARCOTICS WITH OTHER CNS (CENTRAL NERVOUS SYSTEM) DEPRESSANTS SUCH AS ALCOHOL  Diet: as you were eating previously.  Can use over the counter stool softeners and bowel preparations, such as Miralax, to help with bowel movements.  Narcotics can be constipating.  Be sure to drink plenty of fluids    STOP SMOKING OR USING NICOTINE PRODUCTS!!!!  As discussed nicotine severely impairs your body's ability to heal surgical and traumatic wounds but also impairs bone healing.  Wounds and bone heal by forming microscopic blood vessels (angiogenesis) and nicotine is a vasoconstrictor (essentially, shrinks blood vessels).  Therefore, if vasoconstriction occurs  to these microscopic blood vessels they essentially disappear and are unable to deliver necessary nutrients to the healing tissue.  This is one modifiable factor that you can do to dramatically increase your chances of healing your injury.    (This means no smoking, no nicotine gum, patches, etc)  DO NOT USE NONSTEROIDAL ANTI-INFLAMMATORY DRUGS (NSAID'S)  Using products such as Advil (ibuprofen), Aleve (naproxen), Motrin (ibuprofen) for additional pain control during  fracture healing can delay and/or prevent the healing response.  If you would like to take over the counter (OTC) medication, Tylenol (acetaminophen) is ok.  However, some narcotic medications that are given for pain control contain acetaminophen as well. Therefore, you should not exceed more than 4000 mg of tylenol in a day if you do not have liver disease.  Also note that there are may OTC medicines, such as cold medicines and allergy medicines that my contain tylenol as well.  If you have any questions about medications and/or interactions please ask your doctor/PA or your pharmacist.      ICE AND ELEVATE INJURED/OPERATIVE EXTREMITY  Using ice and elevating the injured extremity above your heart can help with swelling and pain control.  Icing in a pulsatile fashion, such as 20 minutes on and 20 minutes off, can be followed.    Do not place ice directly on skin. Make sure there is a barrier between to skin and the ice pack.    Using frozen items such as frozen peas works well as the conform nicely to the are that needs to be iced.  USE AN ACE WRAP OR TED HOSE FOR SWELLING CONTROL  In addition to icing and elevation, Ace wraps or TED hose are used to help limit and resolve swelling.  It is recommended to use Ace wraps or TED hose until you are informed to stop.    When using Ace Wraps start the wrapping distally (farthest away from the body) and wrap proximally (closer to the body)   Example: If you had surgery on your leg or thing and you do not have a splint on, start the ace wrap at the toes and work your way up to the thigh        If you had surgery on your upper extremity and do not have a splint on, start the ace wrap at your fingers and work your way up to the upper arm  IF YOU ARE IN A SPLINT OR CAST DO NOT [place]   If your splint gets wet for any reason please contact the office immediately. You may shower in your splint or cast as long as you keep it dry.  This can be done  by wrapping in a cast cover or garbage back (or similar)  Do Not stick any thing down your splint or cast such as pencils, money, or hangers to try and scratch yourself with.  If you feel itchy take benadryl as prescribed on the bottle for itching  IF YOU ARE IN A CAM BOOT (BLACK BOOT)  You may remove boot periodically. Perform daily dressing changes as noted below.  Wash the liner of the boot regularly and wear a sock when wearing the boot. It is recommended that you sleep in the boot until told otherwise  CALL THE OFFICE WITH ANY QUESTIONS OR CONCERNS: [phone]

## 2016-11-16 NOTE — Discharge Summary (Signed)
Orthopaedic Trauma Service (OTS)  Patient ID: [name] MRN: [id number] DOB/AGE: Nov 26, 1965 51 y.o.  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 Marcelino Scot 1. Revision left ankle fusion. Exchange nailing of Biomet tibio talar calcaneal nail 2. ICBG harvest left iliac crest 3. Autografting left ankle fusion nonunion site  Discharged Condition: good  Hospital Course:   51 year old white male well known to the orthopedic trauma service for complex left calcaneus fracture, left subtalar arthritis, left ankle arthritis. Treated back in August with left tibiotalar calcaneal fusion primarily to address ankle arthritis and diffuse his left ankle. Unfortunately patient developed a nonunion. He was taken to the OR on this admission for revision of his left ankle fusion. Procedures noted above was performed. We did augment his revision with iliac crest bone graft from his left iliac crest. Patient tolerated the procedure well. He did not receive a preoperative block for his left ankle fusion. After surgery he was transferred to the PACU for recovery from anesthesia and transferred to the orthopedic floor for observation, pain control and therapies. He was started on Dilaudid PCA postoperatively. He was restarted on oxycodone and Percocet for pain control. Patient did not have any issues overnight. On postoperative day #1 patient was doing very well. His Foley was discontinued at 0700. Patient was tolerating a regular diet. He mobilize with therapy. Patient felt stable  enough to return to his mom's house. Patient discharged in stable condition. He will resume aspirin for DVT and PE prophylaxis. Does not require any more aggressive pharmacologic agents. Patient will remain splinted for the next 2 weeks but will remain nonweightbearing for 8 weeks total. Again he will continue to abstain from nicotine use he has had great success with Chantix thus far.  Consults: None  Significant Diagnostic Studies: labs:  Results for Justin, Larsen (MRN [id number]) as of 11/16/2016 09:55  Ref. Range 11/16/2016 12:87  BASIC METABOLIC PANEL Unknown Rpt (A)  Sodium Latest Ref Range: 135 - 145 mmol/L 133 (L)  Potassium Latest Ref Range: 3.5 - 5.1 mmol/L 4.0  Chloride Latest Ref Range: 101 - 111 mmol/L 99 (L)  CO2 Latest Ref Range: 22 - 32 mmol/L 24  Glucose Latest Ref Range: 65 - 99 mg/dL 159 (H)  BUN Latest Ref Range: 6 - 20 mg/dL 31 (H)  Creatinine Latest Ref Range: 0.61 - 1.24 mg/dL 1.20  Calcium Latest Ref Range: 8.9 - 10.3 mg/dL 8.4 (L)  Anion gap Latest Ref Range: 5 - 15  10  EGFR (African American) Latest Ref Range: >60 mL/min >60  EGFR (Non-African Amer.) Latest Ref Range: >60 mL/min >60  WBC Latest Ref Range: 4.0 - 10.5 K/uL 14.2 (H)  RBC Latest Ref Range: 4.22 - 5.81 MIL/uL 4.33  Hemoglobin Latest Ref Range: 13.0 - 17.0 g/dL 12.8 (L)  HCT Latest Ref Range: 39.0 - 52.0 % 38.0 (L)  MCV Latest Ref Range: 78.0 - 100.0 fL 87.8  MCH Latest Ref Range: 26.0 - 34.0 pg 29.6  MCHC Latest Ref Range: 30.0 - 36.0 g/dL 33.7  RDW Latest Ref Range: 11.5 - 15.5 %  13.9  Platelets Latest Ref Range: 150 - 400 K/uL 236     Treatments: IV hydration, antibiotics: Ancef, analgesia: Percocet, OxyIR, Robaxin, anticoagulation: Lovenox and aspirin. Aspirin will be continue at discharge, therapies: PT, OT and RN and surgery: As above  Discharge Exam:   Orthopedic Trauma Service Progress Note      Subjective:   Doing ok Much better this am than he was yesterday afternoon when he was seen     Wants to go home today Foley removed around 0700, has yet to void w/o foley    No other complaints      Review of Systems  Constitutional: Negative for chills and fever.  Respiratory: Negative for shortness of breath and wheezing.   Cardiovascular: Negative for chest pain and palpitations.  Gastrointestinal: Negative for abdominal pain, nausea and vomiting.      Objective:    VITALS:         Vitals:    11/15/16 1600 11/15/16 2000 11/16/16 0047 11/16/16 0500  BP:       (!) 168/98  Pulse:       98  Resp: '11 11 15    '$ Temp:       98.1 F (36.7 C)  TempSrc:       Oral  SpO2: 94% 94% 95% 96%  Weight:              Intake/Output      05/01 0701 - 05/02 0700 05/02 0701 - 05/03 0700   I.V. (mL/kg) 3000 (27.2)    Total Intake(mL/kg) 3000 (27.2)    Urine (mL/kg/hr) 300 (0.1)    Blood 150 (0.1)    Total Output 450     Net +2550             LABS   Lab Results Last 24 Hours        Results for orders placed or performed during the hospital encounter of 11/15/16 (from the past 24 hour(s))  Aerobic/Anaerobic Culture (surgical/deep wound)     Status: None (Preliminary result)    Collection Time: 11/15/16 11:14 AM  Result Value Ref Range    Specimen Description WOUND LEFT ANKLE      Special Requests NONE      Gram Stain          RARE WBC PRESENT,BOTH PMN AND MONONUCLEAR NO ORGANISMS SEEN      Culture PENDING      Report Status PENDING    CBC     Status: Abnormal    Collection Time: 11/15/16  2:38 PM  Result Value Ref Range    WBC 11.6 (H) 4.0 - 10.5 K/uL    RBC 4.66 4.22 - 5.81 MIL/uL    Hemoglobin 13.8 13.0 - 17.0 g/dL    HCT 40.4 39.0 - 52.0 %    MCV 86.7 78.0 - 100.0 fL    MCH 29.6 26.0 - 34.0 pg    MCHC 34.2 30.0 - 36.0 g/dL    RDW 13.7 11.5 - 15.5 %    Platelets 201 150 - 400 K/uL  Creatinine, serum     Status: None    Collection Time: 11/15/16  2:38 PM  Result Value Ref Range    Creatinine, Ser 1.12 0.61 - 1.24 mg/dL    GFR calc non Af Amer >60 >60  mL/min    GFR calc Af Amer >60 >60 mL/min  Glucose, capillary     Status: Abnormal    Collection Time: 11/15/16  4:20 PM  Result Value Ref Range    Glucose-Capillary 224 (H) 65 - 99 mg/dL  CBC     Status: Abnormal    Collection Time: 11/16/16  5:41 AM  Result Value Ref Range    WBC 14.2 (H) 4.0 - 10.5 K/uL    RBC 4.33 4.22 - 5.81 MIL/uL    Hemoglobin 12.8 (L) 13.0 - 17.0 g/dL    HCT 38.0 (L) 39.0 - 52.0 %    MCV 87.8 78.0 - 100.0 fL    MCH 29.6 26.0 - 34.0 pg    MCHC 33.7 30.0 - 36.0 g/dL    RDW 13.9 11.5 - 15.5 %    Platelets 236 150 - 400 K/uL  Basic metabolic panel     Status: Abnormal    Collection Time: 11/16/16  5:41 AM  Result Value Ref Range    Sodium 133 (L) 135 - 145 mmol/L    Potassium 4.0 3.5 - 5.1 mmol/L    Chloride 99 (L) 101 - 111 mmol/L    CO2 24 22 - 32 mmol/L    Glucose, Bld 159 (H) 65 - 99 mg/dL    BUN 31 (H) 6 - 20 mg/dL    Creatinine, Ser 1.20 0.61 - 1.24 mg/dL    Calcium 8.4 (L) 8.9 - 10.3 mg/dL    GFR calc non Af Amer >60 >60 mL/min    GFR calc Af Amer >60 >60 mL/min    Anion gap 10 5 - 15          PHYSICAL EXAM:    Gen: awake and alert, NAD, appears more comfortable  Lungs: clear anterior fields Cardiac: RRR, s1 and s2 Ext:       Left Lower Extremity              SLS c/d/I             Dressing L hip stable             Distal motor and sensory functions intact              Ext warm                      Swelling stable   Assessment/Plan: 1 Day Post-Op    Principal Problem:   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 11/15/16 9509 11/15/16 0815     .   POD/HD#: 1   51 y/o male s/p L ankle fusion nonunion revision    - L ankle fusion nonunion s/p revision with ICBG             NWB x 8 weeks             Splint x 2 weeks  PT/OT              Aggressive Ice and elevation              Dc home today               - L ICBG harvest             Dressing change in 2 days             Can clean wound with soap and water when dressing is changed    - pain management              dc pca             Percocet and robaxin at dc              Oxy IR for breakthrough pain    - HTN             resume home meds at dc    - FEN             reg diet             Dc IVF   - Dispo             dc home today              Follow up in 2 weeks at office    Disposition: 06-Home-Health Care Svc  Discharge Instructions    Call MD / Call 911    Complete by:  As directed    If you experience chest pain or shortness of breath, CALL 911 and be transported to the hospital emergency room.  If you develope a fever above 101 F, pus (white drainage) or increased drainage or redness at the wound, or calf pain, call your surgeon's office.   Constipation Prevention    Complete by:  As directed    Drink plenty of fluids.  Prune juice may be helpful.  You may use a stool softener, such as Colace (over the counter) 100 mg twice a day.  Use MiraLax (over the counter) for constipation as needed.   Diet - low sodium heart healthy    Complete by:  As directed    Discharge instructions    Complete by:  As directed    Orthopaedic Trauma Service Discharge Instructions   General Discharge Instructions  WEIGHT BEARING STATUS: Nonweightbearing left leg  RANGE OF MOTION/ACTIVITY: you can not move your ankle or foot as you have had a fusion and are in a splint. Ok to move toes, knee and hip   Wound Care:    Do not remove splint on  left ankle. Do not get splint wet     Can start dressing changes to L hip on 11/18/2016. Ok to clean with soap and water only. If dry can leave open to air, otherwise put dry dressing over the top of it   Discharge Wound Care Instructions  Do NOT apply any ointments, solutions or lotions to pin sites or surgical wounds.  These prevent needed drainage and even though solutions like hydrogen peroxide kill bacteria, they also damage cells lining the pin sites that help fight infection.  Applying lotions or ointments can keep the wounds moist and can cause them to breakdown and open up as well. This can increase the risk for infection. When in doubt call the office.  Surgical incisions should be dressed daily.  If any drainage is noted,  use one layer of adaptic, then gauze, Kerlix, and an ace wrap.  Once the incision is completely dry and without drainage, it may be left open to air out.  Showering may begin 36-48 hours later.  Cleaning gently with soap and water.  Traumatic wounds should be dressed daily as well.    One layer of adaptic, gauze, Kerlix, then ace wrap.  The adaptic can be discontinued once the draining has ceased    If you have a wet to dry dressing: wet the gauze with saline the squeeze as much saline out so the gauze is moist (not soaking wet), place moistened gauze over wound, then place a dry gauze over the moist one, followed by Kerlix wrap, then ace wrap.  PAIN MEDICATION USE AND EXPECTATIONS  You have likely been given narcotic medications to help control your pain.  After a traumatic event that results in an fracture (broken bone) with or without surgery, it is ok to use narcotic pain medications to help control one's pain.  We understand that everyone responds to pain differently and each individual patient will be evaluated on a regular basis for the continued need for narcotic medications. Ideally, narcotic medication use should last no more than 6-8 weeks (coinciding with  fracture healing).   As a patient it is your responsibility as well to monitor narcotic medication use and report the amount and frequency you use these medications when you come to your office visit.   We would also advise that if you are using narcotic medications, you should take a dose prior to therapy to maximize you participation.  IF YOU ARE ON NARCOTIC MEDICATIONS IT IS NOT PERMISSIBLE TO OPERATE A MOTOR VEHICLE (MOTORCYCLE/CAR/TRUCK/MOPED) OR HEAVY MACHINERY DO NOT MIX NARCOTICS WITH OTHER CNS (CENTRAL NERVOUS SYSTEM) DEPRESSANTS SUCH AS ALCOHOL  Diet: as you were eating previously.  Can use over the counter stool softeners and bowel preparations, such as Miralax, to help with bowel movements.  Narcotics can be constipating.  Be sure to drink plenty of fluids    STOP SMOKING OR USING NICOTINE PRODUCTS!!!!  As discussed nicotine severely impairs your body's ability to heal surgical and traumatic wounds but also impairs bone healing.  Wounds and bone heal by forming microscopic blood vessels (angiogenesis) and nicotine is a vasoconstrictor (essentially, shrinks blood vessels).  Therefore, if vasoconstriction occurs to these microscopic blood vessels they essentially disappear and are unable to deliver necessary nutrients to the healing tissue.  This is one modifiable factor that you can do to dramatically increase your chances of healing your injury.    (This means no smoking, no nicotine gum, patches, etc)  DO NOT USE NONSTEROIDAL ANTI-INFLAMMATORY DRUGS (NSAID'S)  Using products such as Advil (ibuprofen), Aleve (naproxen), Motrin (ibuprofen) for additional pain control during fracture healing can delay and/or prevent the healing response.  If you would like to take over the counter (OTC) medication, Tylenol (acetaminophen) is ok.  However, some narcotic medications that are given for pain control contain acetaminophen as well. Therefore, you should not exceed more than 4000 mg of tylenol in  a day if you do not have liver disease.  Also note that there are may OTC medicines, such as cold medicines and allergy medicines that my contain tylenol as well.  If you have any questions about medications and/or interactions please ask your doctor/PA or your pharmacist.      ICE AND ELEVATE INJURED/OPERATIVE EXTREMITY  Using ice and elevating the injured extremity above your heart can  help with swelling and pain control.  Icing in a pulsatile fashion, such as 20 minutes on and 20 minutes off, can be followed.    Do not place ice directly on skin. Make sure there is a barrier between to skin and the ice pack.    Using frozen items such as frozen peas works well as the conform nicely to the are that needs to be iced.  USE AN ACE WRAP OR TED HOSE FOR SWELLING CONTROL  In addition to icing and elevation, Ace wraps or TED hose are used to help limit and resolve swelling.  It is recommended to use Ace wraps or TED hose until you are informed to stop.    When using Ace Wraps start the wrapping distally (farthest away from the body) and wrap proximally (closer to the body)   Example: If you had surgery on your leg or thing and you do not have a splint on, start the ace wrap at the toes and work your way up to the thigh        If you had surgery on your upper extremity and do not have a splint on, start the ace wrap at your fingers and work your way up to the upper arm  IF YOU ARE IN A SPLINT OR CAST DO NOT [place]   If your splint gets wet for any reason please contact the office immediately. You may shower in your splint or cast as long as you keep it dry.  This can be done by wrapping in a cast cover or garbage back (or similar)  Do Not stick any thing down your splint or cast such as pencils, money, or hangers to try and scratch yourself with.  If you feel itchy take benadryl as prescribed on the bottle for itching  IF YOU ARE IN A CAM BOOT (BLACK BOOT)  You may remove boot  periodically. Perform daily dressing changes as noted below.  Wash the liner of the boot regularly and wear a sock when wearing the boot. It is recommended that you sleep in the boot until told otherwise  CALL THE OFFICE WITH ANY QUESTIONS OR CONCERNS: [phone]   Driving restrictions    Complete by:  As directed    No driving   Increase activity slowly as tolerated    Complete by:  As directed    Non weight bearing    Complete by:  As directed    Laterality:  left   Extremity:  Lower     Allergies as of 11/16/2016      Reactions   Bee Venom Swelling   Severe swelling   Fish Allergy Anaphylaxis   Throat swelling with seafood   Iodinated Diagnostic Agents Anaphylaxis   Latex Anaphylaxis   Penicillins Anaphylaxis   Has patient had a PCN reaction causing immediate rash, facial/tongue/throat swelling, SOB or lightheadedness with hypotension: Yes Has patient had a PCN reaction causing severe rash involving mucus membranes or skin necrosis: No Has patient had a PCN reaction that required hospitalization No Has patient had a PCN reaction occurring within the last 10 years: No If all of the above answers are "NO", then may proceed with Cephalosporin use.      Medication List    TAKE these medications   amLODipine 10 MG tablet Commonly known as:  NORVASC Take 1 tablet (10 mg total) by mouth daily.   aspirin 81 MG tablet Take 81 mg by mouth daily.  CALCIUM PO Take 1 tablet by mouth daily.   chlorthalidone 50 MG tablet Commonly known as:  HYGROTON Take 1 tablet (50 mg total) by mouth daily.   docusate sodium 100 MG capsule Commonly known as:  COLACE Take 1 capsule (100 mg total) by mouth 2 (two) times daily.   EPIPEN 2-PAK 0.3 mg/0.3 mL Soaj injection Generic drug:  EPINEPHrine INJECT CONTENTS OF 1 SYRINGE INTO THE MUSCLE AS DIRECTED BY YOUR PHYSICIAN FOR ALLERGIC/ANAPHYLACTIC REACTION   FLUoxetine 40 MG capsule Commonly known as:  PROZAC Take 2 capsules (80 mg total)  by mouth daily.   losartan 100 MG tablet Commonly known as:  COZAAR Take 1 tablet (100 mg total) by mouth daily.   Melatonin 5 MG Tabs Take 5 mg by mouth at bedtime.   methocarbamol 500 MG tablet Commonly known as:  ROBAXIN Take 1-2 tablets (500-1,000 mg total) by mouth every 6 (six) hours as needed for muscle spasms.   oxyCODONE 5 MG immediate release tablet Commonly known as:  Oxy IR/ROXICODONE Take 1-2 tablets (5-10 mg total) by mouth every 6 (six) hours as needed for breakthrough pain (take only between percocet doses for breakthrough pain).   oxyCODONE-acetaminophen 5-325 MG tablet Commonly known as:  PERCOCET/ROXICET Take 1-2 tablets by mouth every 6 (six) hours as needed for moderate pain or severe pain.   senna 8.6 MG tablet Commonly known as:  SENOKOT Take 1 tablet by mouth every other day.   VITAMIN C PO Take 1 tablet by mouth daily.      Follow-up Information    HANDY,MICHAEL H, MD. Schedule an appointment as soon as possible for a visit in 2 week(s).   Specialty:  Orthopedic Surgery Contact information: [address]  [address] [phone]           Discharge Instructions and Plan:  51 y/o male s/p L ankle fusion nonunion revision    - L ankle fusion nonunion s/p revision with ICBG             NWB x 8 weeks             Splint x 2 weeks              PT/OT              Aggressive Ice and elevation              Dc home today               - L ICBG harvest             Dressing change in 2 days             Can clean wound with soap and water when dressing is changed    - pain management              dc pca             Percocet and robaxin at dc              Oxy IR for breakthrough pain    - HTN             resume home meds at dc    - FEN             reg diet             Dc IVF   - Dispo  dc home today              Follow up in 2 weeks at office   Signed:  Jari Pigg, PA-C [facility] [phone] (P) 11/16/2016, 9:49 AM

## 2016-11-16 NOTE — Progress Notes (Signed)
Physical Therapy Treatment Patient Details Name: [name] MRN: [id number] DOB: [birth year] Today's Date: 11/16/2016    History of Present Illness Pt is a 51 yo male with L ankle pain secondary to nonunion of L ankle fusion, s/p athrodesis of L ankle with illiac crest bone graft    PT Comments    Pt doing much better than yesterday.  He was able to ambulate and maintain NWB status.  He has all DME needed including w/c for longer distances.    Follow Up Recommendations  No PT follow up;Supervision - Intermittent     Equipment Recommendations  None recommended by PT    Recommendations for Other Services       Precautions / Restrictions Precautions Precautions: Fall Restrictions Weight Bearing Restrictions: Yes LLE Weight Bearing: Non weight bearing    Mobility  Bed Mobility Overal bed mobility: Needs Assistance Bed Mobility: Supine to Sit     Supine to sit: Supervision     General bed mobility comments: Up in recliner upon arrival  Transfers Overall transfer level: Needs assistance Equipment used: Rolling walker (2 wheeled) Transfers: Sit to/from Stand Sit to Stand: Supervision;Min guard         General transfer comment: good adherence to NWB status and good UE placement  Ambulation/Gait Ambulation/Gait assistance: Min guard Ambulation Distance (Feet): 45 Feet Assistive device: Rolling walker (2 wheeled) Gait Pattern/deviations: Step-to pattern;Trunk flexed     General Gait Details: o2 88% after gait on room air.  Increased to 91% within 30 seconds with cues for pursed lip breathing. Good adherence to NWB status.  Flexed posture, but adjusted RW for height afterwards.   Stairs            Wheelchair Mobility    Modified Rankin (Stroke Patients Only)       Balance Overall balance assessment: Needs assistance Sitting-balance support: Feet supported;No upper extremity supported Sitting balance-Leahy Scale: Good     Standing balance support:  Bilateral upper extremity supported Standing balance-Leahy Scale: Poor Standing balance comment: Reliant on RW                            Cognition Arousal/Alertness: Awake/alert Behavior During Therapy: WFL for tasks assessed/performed Overall Cognitive Status: Within Functional Limits for tasks assessed                                        Exercises      General Comments General comments (skin integrity, edema, etc.): SpO2 90-95% on RA      Pertinent Vitals/Pain Pain Assessment: 0-10 Pain Score: 1  Pain Location: L ankle Pain Descriptors / Indicators: Sore Pain Intervention(s): Limited activity within patient's tolerance;Monitored during session    Home Living Family/patient expects to be discharged to:: Private residence Living Arrangements: Parent Available Help at Discharge: Family;Available 24 hours/day Type of Home: House Home Access: Ramped entrance   Home Layout: One level Home Equipment: Walker - 2 wheels;Grab bars - tub/shower;Hand held shower head;Wheelchair - manual;Tub bench      Prior Function Level of Independence: Independent      Comments: 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   PT Goals (current goals can now be found in the care plan section) Acute Rehab PT Goals Patient Stated Goal: have less pain PT Goal Formulation: With patient Time For  Goal Achievement: 11/22/16 Potential to Achieve Goals: Good Progress towards PT goals: Progressing toward goals    Frequency    Min 5X/week      PT Plan Discharge plan needs to be updated    Co-evaluation              AM-PAC PT "6 Clicks" Daily Activity  Outcome Measure  Difficulty turning over in bed (including adjusting bedclothes, sheets and blankets)?: A Little Difficulty moving from lying on back to sitting on the side of the bed? : A Little Difficulty sitting down on and standing up from a chair with arms (e.g., wheelchair,  bedside commode, etc,.)?: A Little Help needed moving to and from a bed to chair (including a wheelchair)?: A Little Help needed walking in hospital room?: A Little Help needed climbing 3-5 steps with a railing? : A Lot 6 Click Score: 17    End of Session Equipment Utilized During Treatment: Gait belt Activity Tolerance: Patient tolerated treatment well Patient left: in chair;with call bell/phone within reach   PT Visit Diagnosis: Other abnormalities of gait and mobility (R26.89)     Time: 9381-0175 PT Time Calculation (min) (ACUTE ONLY): 16 min  Charges:  $Gait Training: 8-22 mins                    G Codes:       Darin Redmann L. Tamala Julian, [place] Pager [phone] 11/16/2016    Galen Manila 11/16/2016, 11:16 AM

## 2016-11-17 NOTE — Op Note (Signed)
NAME:  [name], [name]                       ACCOUNT NO.:  MEDICAL RECORD NO.:  [id number]  LOCATION:                                 FACILITY:  PHYSICIAN:  Astrid Divine. Marcelino Scot, M.D.      DATE OF BIRTH:  DATE OF PROCEDURE:  11/15/2016 DATE OF DISCHARGE:                              OPERATIVE REPORT   PREOPERATIVE DIAGNOSIS:  Nonunion of left ankle arthrodesis.  POSTOPERATIVE DIAGNOSIS:  Nonunion of left ankle arthrodesis.  PROCEDURE: 1. Revision left ankle arthrodesis. 2. Removal of deep implant, left leg. 3. Left iliac crest auto grafting.  SURGEON:  Astrid Divine. Marcelino Scot, M.D.  ASSISTANT: 1. Ainsley Spinner, PA-C. 2. PA student.  ANESTHESIA:  General.  COMPLICATIONS:  None.  DISPOSITION:  To ICU.  CONDITION:  Stable.  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  The patient had excellent apposition of the bony surfaces and complete resolution of his pain for a period of time.  As he began to weight bear, he noted increasing pain and workup eventually demonstrated failure of fusion with a razor thin zone between the bone that again appeared to be exceedingly well matched, but nonunited.  I discussed with the patient risks and benefits of surgical repair including the persistence of nonunion, the need for iliac crest autografting with pain in the pelvis, potential for infection, nerve injury, vessel injury, and others including heart attack, stroke, particularly given the patient's previous high blood pressure episodes.  He did acknowledge all these risks and wished to proceed.  BRIEF SUMMARY OF PROCEDURE:  The patient was taken to the operating room after administration of preoperative antibiotics.  His left lower extremity was prepped and draped in usual  sterile fashion.  No tourniquet was used during the procedure.  I began with identification of the old incisions and removal of the nail.  Please note that these incisions were different in location from the ones that were used to insert the revision nail at the conclusion of this procedure.  After removal of all of these implants, the foot was examined to see if we can generate any gross motion at the arthrodesis site, we could not.  The lateral incision was remade, dissection carried distally down to the fibula.  Here, the fibular cut was extended slightly proximally and once more tapered in order to reduce the irritation at the distal tip.  The C- arm was then brought in and varus valgus force placed on the ankle, which did demonstrate just slight motion.  A 10 blade was then introduced into this nonunion site and indeed a millimeter thick metal ruler could be slid across its entirety confirming the nonunited arthrodesis.  It was too small to admit even the nearest and small osteotome, however.  In order to provide distraction, a K-wire was placed through the calcaneal tuberosity and distraction produced, the narrowest bur was then used to remove bone on both the talus and the tibia to get back  to fresh cancellous healthy bleeding bone and removed the areas of sclerosis.  This was performed all the way across the joint and checked under x-ray.  It was then packed and attention turned to the iliac crest.  Here, a 4-cm incision was made close to the iliac crest pillar.  A trapdoor was made in the pelvic brim and opened.  We then obtained about 30 mL of robust cancellous iliac crest bone.  Gel-Foam was placed into the crest, the trapdoor closed, repaired with #1 Vicryl, 0 Vicryl, 2-0 Vicryl, and nylon for the skin.  The graft was placed into the arthrodesis site and then, the revision nail inserted.  I did place a guidewire first, reamed up to 13 mm and placed a 12-mm nail which  was longer and required new incisions for placement of the proximal-locking bolts within the tibial shaft, these reached 24 mm in length.  It should be noted that only half the graft was placed prior to nail insertion and then, the other half placed afterward.  After all the graft was placed and the nail inside, the distal talus screw was placed.  I then impacted the nail to compress the ankle joint, placed the proximal locks within the tibia, used the compression device within the nail to further compress the arthrodesis site at the ankle joint and then placed the calcaneal tuberosity screw using the guide.  All images showed appropriate placement of the bolts within the nail, seating of the nail, compression of the articular surface with maintenance of excellent alignment of the arthrodesis.  Wound was irrigated thoroughly and closed in standard layered fashion.  Tourniquet was not used during the procedure.  Because of the difficulty in the areas of surgery, a PA was required and this facilitated the case as well as expedited it considerably.  The patient was taken to the PACU after application of a sterile gently compressive dressing and posterior and stirrup splint.  He will be nonweightbearing on the left lower extremity for the next 6 weeks with graduated weightbearing thereafter.  He will be on formal pharmacologic DVT prophylaxis.  We anticipate seeing him back in the office in 14 days for conversion into a boot.     Astrid Divine. Marcelino Scot, M.D.     MHH/MEDQ  D:  11/17/2016  T:  11/17/2016  Job:  [id number]

## 2016-11-18 NOTE — Progress Notes (Signed)
G-codes originally omitted from documentation     2016-11-24 1700  PT G-Codes **NOT FOR INPATIENT CLASS**  Functional Assessment Tool Used AM-PAC 6 Clicks Basic Mobility  Functional Limitation Mobility: Walking and moving around  Mobility: Walking and Moving Around Current Status [phone]) CN  Mobility: Walking and Moving Around Goal Status (G3943) Justin Huh B. [name] PT, DPT Acute Rehabilitation  [phone] Pager [phone]

## 2016-11-20 LAB — AEROBIC/ANAEROBIC CULTURE (SURGICAL/DEEP WOUND): Culture: NO GROWTH

## 2016-11-20 LAB — AEROBIC/ANAEROBIC CULTURE W GRAM STAIN (SURGICAL/DEEP WOUND)

## 2017-05-12 ENCOUNTER — Encounter: Payer: Self-pay | Admitting: Family Medicine

## 2017-05-12 ENCOUNTER — Ambulatory Visit (INDEPENDENT_AMBULATORY_CARE_PROVIDER_SITE_OTHER): Payer: 59 | Admitting: Family Medicine

## 2017-05-12 VITALS — BP 120/80 | HR 86 | Temp 98.0°F | Wt 238.0 lb

## 2017-05-12 DIAGNOSIS — H60501 Unspecified acute noninfective otitis externa, right ear: Secondary | ICD-10-CM

## 2017-05-12 DIAGNOSIS — H6091 Unspecified otitis externa, right ear: Secondary | ICD-10-CM | POA: Insufficient documentation

## 2017-05-12 MED ORDER — CIPROFLOXACIN HCL 500 MG PO TABS: 500.0000 mg | ORAL_TABLET | Freq: Two times a day (BID) | ORAL | 0 refills | Status: DC

## 2017-05-12 MED ORDER — CIPROFLOXACIN-DEXAMETHASONE 0.3-0.1 % OT SUSP: 4.0000 [drp] | Freq: Two times a day (BID) | OTIC | 0 refills | Status: DC

## 2017-05-12 NOTE — Patient Instructions (Signed)
You have external ear infection - treat with antibiotic drop sent to pharmacy as well as oral antibiotics.  Take ibuprofen 3-4 tablets with food, up to 3 times a day Push fluids and rest. Let us know or seek care if not improving with treatment.   Otitis Externa Otitis externa is an infection of the outer ear canal. The outer ear canal is the area between the outside of the ear and the eardrum. Otitis externa is sometimes called "swimmer's ear." What are the causes? This condition may be caused by:  Swimming in dirty water.  Moisture in the ear.  An injury to the inside of the ear.  An object stuck in the ear.  A cut or scrape on the outside of the ear.  What increases the risk? This condition is more likely to develop in swimmers. What are the signs or symptoms? The first symptom of this condition is often itching in the ear. Later signs and symptoms include:  Swelling of the ear.  Redness in the ear.  Ear pain. The pain may get worse when you pull on your ear.  Pus coming from the ear.  How is this diagnosed? This condition may be diagnosed by examining the ear and testing fluid from the ear for bacteria and funguses. How is this treated? This condition may be treated with:  Antibiotic ear drops. These are often given for 10-14 days.  Medicine to reduce itching and swelling.  Follow these instructions at home:  If you were prescribed antibiotic ear drops, apply them as told by your health care provider. Do not stop using the antibiotic even if your condition improves.  Take over-the-counter and prescription medicines only as told by your health care provider.  Keep all follow-up visits as told by your health care provider. This is important. How is this prevented?  Keep your ear dry. Use the corner of a towel to dry your ear after you swim or bathe.  Avoid scratching or putting things in your ear. Doing these things can damage the ear canal or remove the  protective wax that lines it, which makes it easier for bacteria and funguses to grow.  Avoid swimming in lakes, polluted water, or pools that may not have the right amount of chlorine.  Consider making ear drops and putting 3 or 4 drops in each ear after you swim. Ask your health care provider about how you can make ear drops. Contact a health care provider if:  You have a fever.  After 3 days your ear is still red, swollen, painful, or draining pus.  Your redness, swelling, or pain gets worse.  You have a severe headache.  You have redness, swelling, pain, or tenderness in the area behind your ear. This information is not intended to replace advice given to you by your health care provider. Make sure you discuss any questions you have with your health care provider. Document Released: 07/04/2005 Document Revised: 08/11/2015 Document Reviewed: 04/13/2015 Elsevier Interactive Patient Education  [employer].

## 2017-05-12 NOTE — Progress Notes (Signed)
BP 120/80 (BP Location: Left Arm, Patient Position: Sitting, Cuff Size: Large)   Pulse 86   Temp 98 F (36.7 C) (Oral)   Wt 238 lb (108 kg)   SpO2 99%   BMI 32.28 kg/m    CC: R ear pain Subjective:    Patient ID: [name], male    DOB: Aug 13, 1965, 51 y.o.   MRN: [id number]  HPI: [name] is a 51 y.o. male presenting on 05/12/2017 for Otalgia (severe right ear pain started 2 days ago. Left ear pain started this morning. H/o of ear infecitons. Has had tubes 2x)   2d h/o R ear pain and swelling, now L side is hurting as well. + decreased hearing. R jaw pain, hurts to chew. Drainage from R ear.   No fevers/chills, no nasal congestion, coughing, ST. No tooth pain.  No recent swimming.   He is taking ibuprofen 400mg  TID without improvement   H/o recurrent ear infections, last last 2007. H/o ear tubes growing up.   Relevant past medical, surgical, family and social history reviewed and updated as indicated. Interim medical history since our last visit reviewed. Allergies and medications reviewed and updated. Outpatient Medications Prior to Visit  Medication Sig Dispense Refill  . amLODipine (NORVASC) 10 MG tablet Take 1 tablet (10 mg total) by mouth daily. 90 tablet 1  . Ascorbic Acid (VITAMIN C PO) Take 1 tablet by mouth daily.    Marland Kitchen aspirin 81 MG tablet Take 81 mg by mouth daily.    Marland Kitchen CALCIUM PO Take 1 tablet by mouth daily.    . chlorthalidone (HYGROTON) 50 MG tablet Take 1 tablet (50 mg total) by mouth daily. 30 tablet 11  . docusate sodium (COLACE) 100 MG capsule Take 1 capsule (100 mg total) by mouth 2 (two) times daily. 30 capsule 0  . EPIPEN 2-PAK 0.3 MG/0.3ML SOAJ injection INJECT CONTENTS OF 1 SYRINGE INTO THE MUSCLE AS DIRECTED BY YOUR PHYSICIAN FOR ALLERGIC/ANAPHYLACTIC REACTION 2 Device 0  . FLUoxetine (PROZAC) 40 MG capsule Take 2 capsules (80 mg total) by mouth daily. 180 capsule 3  . losartan (COZAAR) 100 MG tablet Take 1 tablet (100 mg total) by mouth daily.  90 tablet 1  . Melatonin 5 MG TABS Take 5 mg by mouth at bedtime.     . methocarbamol (ROBAXIN) 500 MG tablet Take 1-2 tablets (500-1,000 mg total) by mouth every 6 (six) hours as needed for muscle spasms. 70 tablet 0  . oxyCODONE (OXY IR/ROXICODONE) 5 MG immediate release tablet Take 1-2 tablets (5-10 mg total) by mouth every 6 (six) hours as needed for breakthrough pain (take only between percocet doses for breakthrough pain). 30 tablet 0  . oxyCODONE-acetaminophen (PERCOCET/ROXICET) 5-325 MG tablet Take 1-2 tablets by mouth every 6 (six) hours as needed for moderate pain or severe pain. 70 tablet 0  . senna (SENOKOT) 8.6 MG tablet Take 1 tablet by mouth every other day.     No facility-administered medications prior to visit.      Per HPI unless specifically indicated in ROS section below Review of Systems     Objective:    BP 120/80 (BP Location: Left Arm, Patient Position: Sitting, Cuff Size: Large)   Pulse 86   Temp 98 F (36.7 C) (Oral)   Wt 238 lb (108 kg)   SpO2 99%   BMI 32.28 kg/m   Wt Readings from Last 3 Encounters:  05/12/17 238 lb (108 kg)  11/15/16 243 lb (110.2 kg)  11/11/16 243 lb 8 oz (110.5 kg)    Physical Exam  Constitutional: He appears well-developed and well-nourished. No distress.  HENT:  Head: Normocephalic and atraumatic.  Right Ear: There is drainage, swelling and tenderness. Decreased hearing is noted.  Left Ear: Hearing, tympanic membrane, external ear and ear canal normal.  Nose: Nose normal. No mucosal edema or rhinorrhea. Right sinus exhibits no maxillary sinus tenderness and no frontal sinus tenderness. Left sinus exhibits no maxillary sinus tenderness and no frontal sinus tenderness.  Mouth/Throat: Uvula is midline, oropharynx is clear and moist and mucous membranes are normal. No oropharyngeal exudate, posterior oropharyngeal edema, posterior oropharyngeal erythema or tonsillar abscesses.  Marked swelling of external canal and pinna on right  but canal open, purulent drainage present Pus around TM, but do not appreciate TM perforation  Eyes: Pupils are equal, round, and reactive to light. Conjunctivae and EOM are normal. No scleral icterus.  Neck: Normal range of motion. Neck supple.  Lymphadenopathy:       Head (right side): Preauricular and posterior auricular adenopathy present.       Head (left side): No preauricular and no posterior auricular adenopathy present.    He has no cervical adenopathy.  Skin: Skin is warm and dry. No rash noted.  Nursing note and vitals reviewed.  Lab Results  Component Value Date   CREATININE 1.20 11/16/2016       Assessment & Plan:   Problem List Items Addressed This Visit    External otitis of right ear - Primary    Marked to severe case of external otitis. Treat with ciprodex ear drops and cipro oral antibiotic. Update if not improving with treatment. Discussed ibuprofen dosing 600-800mg  at a time, with meals. Pt agrees with plan.           Follow up plan: Return if symptoms worsen or fail to improve.  Ria Bush, MD

## 2017-05-12 NOTE — Assessment & Plan Note (Signed)
Marked to severe case of external otitis. Treat with ciprodex ear drops and cipro oral antibiotic. Update if not improving with treatment. Discussed ibuprofen dosing 600-800mg  at a time, with meals. Pt agrees with plan.

## 2017-08-30 ENCOUNTER — Other Ambulatory Visit: Payer: Self-pay | Admitting: *Deleted

## 2017-08-30 MED ORDER — AMLODIPINE BESYLATE 10 MG PO TABS: 10.0000 mg | ORAL_TABLET | Freq: Every day | ORAL | 0 refills | Status: DC

## 2017-08-31 MED ORDER — FLUOXETINE HCL 40 MG PO CAPS: 80.0000 mg | ORAL_CAPSULE | Freq: Every day | ORAL | 0 refills | Status: DC

## 2017-08-31 NOTE — Addendum Note (Signed)
Addended by: Carter Kitten on: 08/31/2017 02:03 PM   Modules accepted: Orders

## 2017-09-26 ENCOUNTER — Other Ambulatory Visit: Payer: Self-pay | Admitting: Family Medicine

## 2017-09-26 NOTE — Telephone Encounter (Signed)
Last office visit 07/12/2017 with Dr. Darnell Level for otitis externa.  Last seen by PCP 09/07/2016.  No future appointments.  Last refilled 09/07/2016 for #90 with 1 refill.  Refill?

## 2017-09-26 NOTE — Telephone Encounter (Signed)
Copied from [place] [phone]. Topic: Quick Communication - Rx Refill/Question >> Sep 26, 2017  4:38 PM Percell Belt A wrote: Medication:  losartan (COZAAR) 100 MG tablet [[id number]] and chlorthalidone (HYGROTON) 50 MG tablet [[id number]]    Has the patient contacted their pharmacy? No    (Agent: If no, request that the patient contact the pharmacy for the refill.)   Preferred Pharmacy (with phone number or street name): [facility]   Agent: Please be advised that RX refills may take up to 3 business days. We ask that you follow-up with your pharmacy.

## 2017-09-27 MED ORDER — CHLORTHALIDONE 50 MG PO TABS: 50.0000 mg | ORAL_TABLET | Freq: Every day | ORAL | 5 refills | Status: DC

## 2017-09-27 NOTE — Telephone Encounter (Signed)
LOV 09/07/16 with Dr. [facility]  Cozaar & [place] refill requests

## 2017-11-01 ENCOUNTER — Other Ambulatory Visit: Payer: Self-pay | Admitting: Family Medicine

## 2017-11-20 ENCOUNTER — Other Ambulatory Visit: Payer: Self-pay | Admitting: Family Medicine

## 2017-11-20 ENCOUNTER — Other Ambulatory Visit (INDEPENDENT_AMBULATORY_CARE_PROVIDER_SITE_OTHER): Payer: 59

## 2017-11-20 DIAGNOSIS — E785 Hyperlipidemia, unspecified: Secondary | ICD-10-CM

## 2017-11-20 DIAGNOSIS — Z125 Encounter for screening for malignant neoplasm of prostate: Secondary | ICD-10-CM

## 2017-11-20 DIAGNOSIS — Z79899 Other long term (current) drug therapy: Secondary | ICD-10-CM

## 2017-11-20 LAB — CBC WITH DIFFERENTIAL/PLATELET
Basophils Absolute: 0 10*3/uL (ref 0.0–0.1)
Basophils Relative: 0.3 % (ref 0.0–3.0)
Eosinophils Absolute: 0.3 10*3/uL (ref 0.0–0.7)
Eosinophils Relative: 3 % (ref 0.0–5.0)
HCT: 47.9 % (ref 39.0–52.0)
Hemoglobin: 16.5 g/dL (ref 13.0–17.0)
Lymphocytes Relative: 33.9 % (ref 12.0–46.0)
Lymphs Abs: 3.3 10*3/uL (ref 0.7–4.0)
MCHC: 34.4 g/dL (ref 30.0–36.0)
MCV: 86.4 fl (ref 78.0–100.0)
Monocytes Absolute: 0.8 10*3/uL (ref 0.1–1.0)
Monocytes Relative: 7.9 % (ref 3.0–12.0)
Neutro Abs: 5.4 10*3/uL (ref 1.4–7.7)
Neutrophils Relative %: 54.9 % (ref 43.0–77.0)
Platelets: 249 10*3/uL (ref 150.0–400.0)
RBC: 5.55 Mil/uL (ref 4.22–5.81)
RDW: 14.1 % (ref 11.5–15.5)
WBC: 9.8 10*3/uL (ref 4.0–10.5)

## 2017-11-20 LAB — BASIC METABOLIC PANEL
BUN: 21 mg/dL (ref 6–23)
CO2: 32 mEq/L (ref 19–32)
Calcium: 9.6 mg/dL (ref 8.4–10.5)
Chloride: 98 mEq/L (ref 96–112)
Creatinine, Ser: 1.46 mg/dL (ref 0.40–1.50)
GFR: 53.89 mL/min — ABNORMAL LOW (ref >60.00)
Glucose, Bld: 99 mg/dL (ref 70–99)
Potassium: 3.4 mEq/L — ABNORMAL LOW (ref 3.5–5.1)
Sodium: 139 mEq/L (ref 135–145)

## 2017-11-20 LAB — LIPID PANEL
Cholesterol: 235 mg/dL — ABNORMAL HIGH (ref 0–200)
HDL: 33.8 mg/dL — ABNORMAL LOW (ref >39.00)
NonHDL: 200.91
Total CHOL/HDL Ratio: 7
Triglycerides: 201 mg/dL — ABNORMAL HIGH (ref 0.0–149.0)
VLDL: 40.2 mg/dL — ABNORMAL HIGH (ref 0.0–40.0)

## 2017-11-20 LAB — LDL CHOLESTEROL, DIRECT: Direct LDL: 168 mg/dL

## 2017-11-20 LAB — HEPATIC FUNCTION PANEL
ALT: 17 U/L (ref 0–53)
AST: 14 U/L (ref 0–37)
Albumin: 4.1 g/dL (ref 3.5–5.2)
Alkaline Phosphatase: 110 U/L (ref 39–117)
Bilirubin, Direct: 0.1 mg/dL (ref 0.0–0.3)
Total Bilirubin: 0.4 mg/dL (ref 0.2–1.2)
Total Protein: 6.8 g/dL (ref 6.0–8.3)

## 2017-11-20 LAB — PSA: PSA: 0.58 ng/mL (ref 0.10–4.00)

## 2017-11-23 ENCOUNTER — Encounter: Payer: Self-pay | Admitting: Family Medicine

## 2017-11-23 ENCOUNTER — Ambulatory Visit: Payer: 59 | Admitting: Family Medicine

## 2017-11-23 ENCOUNTER — Encounter: Payer: Self-pay | Admitting: Gastroenterology

## 2017-11-23 VITALS — BP 134/80 | HR 73 | Temp 97.2°F | Ht 69.75 in | Wt 222.0 lb

## 2017-11-23 DIAGNOSIS — Z1211 Encounter for screening for malignant neoplasm of colon: Secondary | ICD-10-CM | POA: Diagnosis not present

## 2017-11-23 DIAGNOSIS — Z0000 Encounter for general adult medical examination without abnormal findings: Secondary | ICD-10-CM | POA: Diagnosis not present

## 2017-11-23 DIAGNOSIS — Z23 Encounter for immunization: Secondary | ICD-10-CM

## 2017-11-23 MED ORDER — TAMSULOSIN HCL 0.4 MG PO CAPS: 0.4000 mg | ORAL_CAPSULE | Freq: Every day | ORAL | 1 refills | Status: DC

## 2017-11-23 MED ORDER — BUPROPION HCL ER (XL) 150 MG PO TB24: 150.0000 mg | ORAL_TABLET | Freq: Every day | ORAL | 1 refills | Status: DC

## 2017-11-23 NOTE — Patient Instructions (Signed)
Insomnia:  Melatonin up to 10 mg can be taken 1 hour before sleep very safely every day  Antihistamines: 2 tabs of Benadryl is OK Or can also take 2 Dramamine (get the older version that can cause drowsiness) Or Unisom (doxylamine)
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
## 2017-11-24 ENCOUNTER — Encounter: Payer: Self-pay | Admitting: Family Medicine

## 2017-12-29 ENCOUNTER — Other Ambulatory Visit: Payer: Self-pay | Admitting: Family Medicine

## 2018-01-04 ENCOUNTER — Encounter: Payer: Self-pay | Admitting: Family Medicine

## 2018-01-04 ENCOUNTER — Ambulatory Visit: Payer: 59 | Admitting: Family Medicine

## 2018-01-04 VITALS — BP 122/72 | HR 86 | Temp 97.7°F | Ht 69.75 in | Wt 238.5 lb

## 2018-01-04 DIAGNOSIS — F321 Major depressive disorder, single episode, moderate: Secondary | ICD-10-CM

## 2018-01-04 DIAGNOSIS — G4709 Other insomnia: Secondary | ICD-10-CM

## 2018-01-04 MED ORDER — MIRTAZAPINE 15 MG PO TABS
15.0000 mg | ORAL_TABLET | Freq: Every day | ORAL | 2 refills | Status: DC
Start: 2018-01-04 — End: 2018-01-22
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
## 2018-01-08 ENCOUNTER — Ambulatory Visit (AMBULATORY_SURGERY_CENTER): Payer: Self-pay | Admitting: *Deleted

## 2018-01-08 ENCOUNTER — Other Ambulatory Visit: Payer: Self-pay

## 2018-01-08 ENCOUNTER — Telehealth: Payer: Self-pay | Admitting: *Deleted

## 2018-01-08 VITALS — Ht 72.0 in | Wt 240.0 lb

## 2018-01-08 DIAGNOSIS — Z1211 Encounter for screening for malignant neoplasm of colon: Secondary | ICD-10-CM

## 2018-01-08 MED ORDER — NA SULFATE-K SULFATE-MG SULF 17.5-3.13-1.6 GM/177ML PO SOLN: 1.0000 | Freq: Once | ORAL | 0 refills | Status: AC

## 2018-01-08 NOTE — Progress Notes (Signed)
Patient denies any allergies to eggs or soy. Patient has had problems with anesthesia,"acute renal failure after 2 surgeries, note sent to John Nulty,CRNA.  Patient denies any oxygen use at home. Patient denies taking any diet/weight loss medications or blood thinners. EMMI education assisgned to patient on colonoscopy, this was explained and instructions given to patient. Suprep $15 coupon given to pt.  2 day prep instructions given to pt, due to chronic constipation per pt.

## 2018-01-08 NOTE — Telephone Encounter (Signed)
During pre-visit today patient states he had acute renal failure after 2 surgeries. Once after a foot sx, then after ankle surgery. Explained that we use deep sedation. Please review. Marks for [employer]? Thank you, Mckenzie Toruno pv

## 2018-01-10 NOTE — Telephone Encounter (Signed)
Robbin,  This pt is cleared for anesthetic care at [facility].   Thanks,   Marquel Pottenger 

## 2018-01-15 ENCOUNTER — Encounter: Payer: Self-pay | Admitting: Gastroenterology

## 2018-01-19 ENCOUNTER — Telehealth: Payer: Self-pay

## 2018-01-19 ENCOUNTER — Telehealth: Payer: Self-pay | Admitting: Family Medicine

## 2018-01-19 MED ORDER — EPINEPHRINE 0.3 MG/0.3ML IJ SOAJ: INTRAMUSCULAR | 0 refills | Status: DC

## 2018-01-19 NOTE — Telephone Encounter (Signed)
Epipen 2-Pak 0.3 mg/0.55ml SOAJ injection refill request  LOV 5/9/1 with Dr. Frederico Hamman Copland.  [place], [place]

## 2018-01-19 NOTE — Telephone Encounter (Signed)
Copied from [facility] [id number]. Topic: Quick Communication - See Telephone Encounter >> Jan 19, 2018 10:11 AM Mylinda Latina, NT wrote: CRM for notification. See Telephone encounter for: 01/19/18. Patient called and states he needs a refill of his EPIPEN 2-PAK 0.3 MG/0.3ML SOAJ injection ]  [address] - [place], [place] - [employer] [phone] (Phone) [phone] (Fax)

## 2018-01-19 NOTE — Telephone Encounter (Signed)
Copied from [place] [id number]. Topic: General - Other >> Jan 19, 2018  1:52 PM Keene Breath wrote: Reason for CRM: Patient called to speak with nurse Butch Penny regarding medication mirtazapine (REMERON) 15 MG tablet.  Patient states that the medication is too strong.  He tried cutting it in half and still felt that it was too strong.  Please advise.  CB# [phone].  If patient is not there, it is OK to leave a message with his mother.

## 2018-01-21 NOTE — Telephone Encounter (Signed)
Ok,   Have him d/c the remeron.  Trial of trazadone 25 mg, 1-2 tabs as needed for insomnia, #60, 1 ref  Start off with #1 tab only 1 hour before bedtime.

## 2018-01-22 ENCOUNTER — Encounter: Payer: Self-pay | Admitting: Gastroenterology

## 2018-01-22 ENCOUNTER — Ambulatory Visit (AMBULATORY_SURGERY_CENTER): Payer: 59 | Admitting: Gastroenterology

## 2018-01-22 ENCOUNTER — Other Ambulatory Visit: Payer: Self-pay

## 2018-01-22 VITALS — BP 114/66 | HR 69 | Temp 98.0°F | Resp 20 | Ht 72.0 in | Wt 240.0 lb

## 2018-01-22 DIAGNOSIS — D124 Benign neoplasm of descending colon: Secondary | ICD-10-CM | POA: Diagnosis not present

## 2018-01-22 DIAGNOSIS — D125 Benign neoplasm of sigmoid colon: Secondary | ICD-10-CM

## 2018-01-22 DIAGNOSIS — D123 Benign neoplasm of transverse colon: Secondary | ICD-10-CM | POA: Diagnosis not present

## 2018-01-22 DIAGNOSIS — D120 Benign neoplasm of cecum: Secondary | ICD-10-CM | POA: Diagnosis not present

## 2018-01-22 DIAGNOSIS — Z1211 Encounter for screening for malignant neoplasm of colon: Secondary | ICD-10-CM

## 2018-01-22 MED ORDER — TRAZODONE HCL 50 MG PO TABS: 25.0000 mg | ORAL_TABLET | Freq: Every evening | ORAL | 5 refills | Status: DC | PRN

## 2018-01-22 MED ORDER — SODIUM CHLORIDE 0.9 % IV SOLN: 500.0000 mL | Freq: Once | INTRAVENOUS | Status: DC

## 2018-01-22 NOTE — Progress Notes (Signed)
Called to room to assist during endoscopic procedure.  Patient ID and intended procedure confirmed with present staff. Received instructions for my participation in the procedure from the performing physician.  

## 2018-01-22 NOTE — Telephone Encounter (Addendum)
Trazadone sent into [facility] as instructed by Dr. Lorelei Pont.  [name] notified as instructed by telephone.  Medication list updated.

## 2018-01-22 NOTE — Addendum Note (Signed)
Addended by: Carter Kitten on: 01/22/2018 10:16 AM   Modules accepted: Orders

## 2018-01-22 NOTE — Patient Instructions (Signed)
Handouts put in sealed envelope on polyps and hemorrhoids.  YOU HAD AN ENDOSCOPIC PROCEDURE TODAY AT [facility]:   Refer to the procedure report that was given to you for any specific questions about what was found during the examination.  If the procedure report does not answer your questions, please call your gastroenterologist to clarify.  If you requested that your care partner not be given the details of your procedure findings, then the procedure report has been included in a sealed envelope for you to review at your convenience later.  YOU SHOULD EXPECT: Some feelings of bloating in the abdomen. Passage of more gas than usual.  Walking can help get rid of the air that was put into your GI tract during the procedure and reduce the bloating. If you had a lower endoscopy (such as a colonoscopy or flexible sigmoidoscopy) you may notice spotting of blood in your stool or on the toilet paper. If you underwent a bowel prep for your procedure, you may not have a normal bowel movement for a few days.  Please Note:  You might notice some irritation and congestion in your nose or some drainage.  This is from the oxygen used during your procedure.  There is no need for concern and it should clear up in a day or so.  SYMPTOMS TO REPORT IMMEDIATELY:   Following lower endoscopy (colonoscopy or flexible sigmoidoscopy):  Excessive amounts of blood in the stool  Significant tenderness or worsening of abdominal pains  Swelling of the abdomen that is new, acute  Fever of 100F or higher   For urgent or emergent issues, a gastroenterologist can be reached at any hour by calling [phone].   DIET:  We do recommend a small meal at first, but then you may proceed to your regular diet.  Drink plenty of fluids but you should avoid alcoholic beverages for 24 hours.  ACTIVITY:  You should plan to take it easy for the rest of today and you should NOT DRIVE or use heavy machinery until  tomorrow (because of the sedation medicines used during the test).    FOLLOW UP: Our staff will call the number listed on your records the next business day following your procedure to check on you and address any questions or concerns that you may have regarding the information given to you following your procedure. If we do not reach you, we will leave a message.  However, if you are feeling well and you are not experiencing any problems, there is no need to return our call.  We will assume that you have returned to your regular daily activities without incident.  If any biopsies were taken you will be contacted by phone or by letter within the next 1-3 weeks.  Please call us at [phone] if you have not heard about the biopsies in 3 weeks.    SIGNATURES/CONFIDENTIALITY: You and/or your care partner have signed paperwork which will be entered into your electronic medical record.  These signatures attest to the fact that that the information above on your After Visit Summary has been reviewed and is understood.  Full responsibility of the confidentiality of this discharge information lies with you and/or your care-partner.

## 2018-01-22 NOTE — Telephone Encounter (Signed)
Trazadone does not come in 25 mg.  Ok to send in 50 mg?  Please advise.

## 2018-01-22 NOTE — Telephone Encounter (Signed)
OK to send 50 mg, I want him to start with 1/2 tab only and can increase to 1 tab if needed.  #30, 5 ref

## 2018-01-22 NOTE — Progress Notes (Signed)
A/ox3 pleased with MAC, report to RN 

## 2018-01-22 NOTE — Op Note (Signed)
[facility] Patient Name: Eleuterio Dollar Procedure Date: 01/22/2018 1:23 PM MRN: [id number] Endoscopist: Remo Lipps P. Havery Moros , MD Age: 52 Referring MD:  Date of Birth: 1966-05-12 Gender: Male Account #: [id number] Procedure:                Colonoscopy Indications:              Screening for colorectal malignant neoplasm, This                            is the patient's first colonoscopy Medicines:                Monitored Anesthesia Care Procedure:                Pre-Anesthesia Assessment:                           - Prior to the procedure, a History and Physical                            was performed, and patient medications and                            allergies were reviewed. The patient's tolerance of                            previous anesthesia was also reviewed. The risks                            and benefits of the procedure and the sedation                            options and risks were discussed with the patient.                            All questions were answered, and informed consent                            was obtained. Prior Anticoagulants: The patient has                            taken no previous anticoagulant or antiplatelet                            agents. ASA Grade Assessment: II - A patient with                            mild systemic disease. After reviewing the risks                            and benefits, the patient was deemed in                            satisfactory condition to undergo the procedure.  After obtaining informed consent, the colonoscope                            was passed under direct vision. Throughout the                            procedure, the patient's blood pressure, pulse, and                            oxygen saturations were monitored continuously. The                            Colonoscope was introduced through the anus and                            advanced to the the cecum,  identified by                            appendiceal orifice and ileocecal valve. The                            colonoscopy was performed without difficulty. The                            patient tolerated the procedure well. The quality                            of the bowel preparation was good. The ileocecal                            valve, appendiceal orifice, and rectum were                            photographed. Scope In: 1:32:18 PM Scope Out: 1:56:53 PM Scope Withdrawal Time: 0 hours 20 minutes 1 second  Total Procedure Duration: 0 hours 24 minutes 35 seconds  Findings:                 The perianal and digital rectal examinations were                            normal.                           A 5 mm polyp was found in the cecum. The polyp was                            sessile. The polyp was removed with a cold snare.                            Resection and retrieval were complete.                           Two sessile polyps were found in the transverse  colon. The polyps were 5 mm in size. These polyps                            were removed with a cold snare. Resection and                            retrieval were complete.                           A 3 mm polyp was found in the descending colon. The                            polyp was sessile. The polyp was removed with a                            cold snare. Resection and retrieval were complete.                           Three sessile polyps were found in the sigmoid                            colon. The polyps were 3 to 4 mm in size. These                            polyps were removed with a cold snare. Resection                            and retrieval were complete.                           Internal hemorrhoids were found during retroflexion.                           The exam was otherwise without abnormality. Complications:            No immediate complications. Estimated blood  loss:                            Minimal. Estimated Blood Loss:     Estimated blood loss was minimal. Impression:               - One 5 mm polyp in the cecum, removed with a cold                            snare. Resected and retrieved.                           - Two 5 mm polyps in the transverse colon, removed                            with a cold snare. Resected and retrieved.                           - One 3  mm polyp in the descending colon, removed                            with a cold snare. Resected and retrieved.                           - Three 3 to 4 mm polyps in the sigmoid colon,                            removed with a cold snare. Resected and retrieved.                           - Internal hemorrhoids.                           - The examination was otherwise normal. Recommendation:           - Patient has a contact number available for                            emergencies. The signs and symptoms of potential                            delayed complications were discussed with the                            patient. Return to normal activities tomorrow.                            Written discharge instructions were provided to the                            patient.                           - Resume previous diet.                           - Continue present medications.                           - Await pathology results.                           - Repeat colonoscopy for surveillance based on                            pathology results. Remo Lipps P. Oaklie Durrett, MD 01/22/2018 2:03:31 PM This report has been signed electronically.

## 2018-01-23 ENCOUNTER — Telehealth: Payer: Self-pay

## 2018-01-23 NOTE — Telephone Encounter (Signed)
  Follow up Call-  Call back number 01/22/2018  Post procedure Call Back phone  # [phone]  Permission to leave phone message Yes  Some recent data might be hidden     Patient questions:  Do you have a fever, pain , or abdominal swelling? No. Pain Score  0 *  Have you tolerated food without any problems? Yes.    Have you been able to return to your normal activities? Yes.    Do you have any questions about your discharge instructions: Diet   No. Medications  No. Follow up visit  No.  Do you have questions or concerns about your Care? No.  Actions: * If pain score is 4 or above: No action needed, pain <4.

## 2018-01-27 ENCOUNTER — Encounter: Payer: Self-pay | Admitting: Gastroenterology

## 2018-02-07 ENCOUNTER — Encounter: Payer: Self-pay | Admitting: Family Medicine

## 2018-02-07 ENCOUNTER — Ambulatory Visit: Payer: 59 | Admitting: Family Medicine

## 2018-02-07 VITALS — BP 110/68 | HR 82 | Temp 98.0°F | Ht 69.75 in | Wt 250.0 lb

## 2018-02-07 DIAGNOSIS — F321 Major depressive disorder, single episode, moderate: Secondary | ICD-10-CM

## 2018-02-07 DIAGNOSIS — G4709 Other insomnia: Secondary | ICD-10-CM

## 2018-02-07 NOTE — Progress Notes (Signed)
   Dr. Frederico Hamman T. Lucille Witts, MD, [facility] and [facility] Medicine [place], [address] Phone: [phone] Fax: [phone]  02/07/2018  Patient: [name], MRN: [id number], DOB: Aug 12, 1965, 52 y.o.  Primary Physician:  Owens Loffler, MD   Chief Complaint  Patient presents with  . Follow-up    depression, doing better   Subjective:   [name] is a 52 y.o. very pleasant male patient who presents with the following:  Depression doing better Prozac 80 mg Trazodone - sleeps great all night long.   >15 minutes spent in face to face time with patient, >50% spent in counselling or coordination of care:  Angas is really doing remarkably better.  He has been sleeping well and taking his trazodone.  I am suspicious that a lot of his acute worsening of his depressive symptoms in the recent past was secondary to his insomnia, which has improved quite a bit.  He is sleeping much better his interest is dramatically improved.  He is not feeling guilty, and his energy levels are much better.  He is not having any suicidality.  Electronically Signed  By: Owens Loffler, MD On: 02/07/2018 10:40 AM

## 2018-02-23 ENCOUNTER — Other Ambulatory Visit: Payer: Self-pay | Admitting: Family Medicine

## 2018-02-23 MED ORDER — CHLORTHALIDONE 50 MG PO TABS: 50.0000 mg | ORAL_TABLET | Freq: Every day | ORAL | 2 refills | Status: DC

## 2018-02-23 MED ORDER — AMLODIPINE BESYLATE 10 MG PO TABS: 10.0000 mg | ORAL_TABLET | Freq: Every day | ORAL | 1 refills | Status: DC

## 2018-02-23 MED ORDER — LOSARTAN POTASSIUM 100 MG PO TABS: 100.0000 mg | ORAL_TABLET | Freq: Every day | ORAL | 1 refills | Status: DC

## 2018-02-23 NOTE — Telephone Encounter (Signed)
Refills of Chlorthalidone, Amlodipine and Losartan sent to requested pharmacy. Refills of Fluoxetine, Tamsulosin and Trazodone already available at requested pharmacy.

## 2018-02-23 NOTE — Telephone Encounter (Signed)
Copied from [place] [id number]. Topic: Quick Communication - Rx Refill/Question >> Feb 23, 2018  4:17 PM Wynetta Emery, [place] C wrote: Medication:   1. chlorthalidone (HYGROTON) 50 MG tablet                     2. amLODipine (NORVASC) 10 MG tablet                    3. FLUoxetine (PROZAC) 40 MG capsule                     4. losartan (COZAAR) 100 MG tablet   5. tamsulosin (FLOMAX) 0.4 MG CAPS capsule                      6. traZODone (DESYREL) 50 MG tablet   Pt would like to have a 90 day supply on medications if possible   Has the patient contacted their pharmacy? No  (Agent: If no, request that the patient contact the pharmacy for the refill.) (Agent: If yes, when and what did the pharmacy advise?)  Preferred Pharmacy (with phone number or street name): [facility], [place]: Please be advised that RX refills may take up to 3 business days. We ask that you follow-up with your pharmacy.

## 2018-04-21 ENCOUNTER — Other Ambulatory Visit: Payer: Self-pay | Admitting: Family Medicine

## 2018-05-03 ENCOUNTER — Ambulatory Visit (INDEPENDENT_AMBULATORY_CARE_PROVIDER_SITE_OTHER): Payer: 59 | Admitting: Family Medicine

## 2018-05-03 ENCOUNTER — Encounter: Payer: Self-pay | Admitting: Family Medicine

## 2018-05-03 VITALS — BP 138/78 | HR 72 | Temp 97.9°F | Ht 69.75 in | Wt 239.0 lb

## 2018-05-03 DIAGNOSIS — F17200 Nicotine dependence, unspecified, uncomplicated: Secondary | ICD-10-CM

## 2018-05-03 DIAGNOSIS — B9789 Other viral agents as the cause of diseases classified elsewhere: Secondary | ICD-10-CM

## 2018-05-03 DIAGNOSIS — J069 Acute upper respiratory infection, unspecified: Secondary | ICD-10-CM

## 2018-05-03 MED ORDER — BENZONATATE 200 MG PO CAPS: 200.0000 mg | ORAL_CAPSULE | Freq: Three times a day (TID) | ORAL | 1 refills | Status: DC | PRN
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
## 2018-05-03 NOTE — Patient Instructions (Addendum)
Drink lots of fluids Get some rest   Keep thinking about quitting smoking   For cough Try tessalon three times per day (helps the tickle that makes you cough)  Over the counter mucinex DM can also help (loosens phlegm and also helps cough)   Update if not starting to improve in a week or if worsening   If you get worse -please let us know right away

## 2018-05-06 NOTE — Assessment & Plan Note (Signed)
Disc in detail risks of smoking and possible outcomes including copd, vascular/ heart disease, cancer , respiratory and sinus infections  Pt voices understanding  

## 2018-05-06 NOTE — Assessment & Plan Note (Signed)
In a smoker  Starting to improve Reassuring exam  Tessalon px  Expectorant -DM prn  Disc symptomatic care - see instructions on AVS Update if not starting to improve in a week or if worsening  Enc strongly to quit smoking

## 2018-07-05 ENCOUNTER — Encounter: Payer: Self-pay | Admitting: Family Medicine

## 2018-07-05 ENCOUNTER — Telehealth: Payer: Self-pay | Admitting: Family Medicine

## 2018-07-05 NOTE — Telephone Encounter (Signed)
Left message asking pt to call office Please let pt know his appointment with dr copland on 1/22 time has been changed to 11  Mailed letter

## 2018-08-05 NOTE — Progress Notes (Signed)
   Dr. Frederico Hamman T. Aubry Tucholski, MD, [facility] Primary Care and Sports Medicine [place], [address] Phone: [phone] Fax: [phone]  08/08/2018  Patient: [name], MRN: [id number], DOB: 09/17/1965, 53 y.o.  Primary Physician:  Owens Loffler, MD   Chief Complaint  Patient presents with  . Follow-up   Subjective:   SLADEN PLANCARTE is a 53 y.o. very pleasant male patient who presents with the following:  He is here for htn f/u and dep f/u  He has been out of work for 3 months, and he stopped all of his medication 3 months ago.  He remains sober.  Nevertheless, his blood pressures look normal today even off of all medication.  He is quite depressed today.  He is doing much worse after stopping all of his medication, and he is having a hard time being out of work.  He plans to apply for Social Security disability.  He is not sleeping well at all.  He is lost interest in his activities.  He does feel some guilt that is not contributing to his mother's household.  He still living with his mom, and she is doing well.  He does not have any homicidality.  He has some occasional passive thoughts of hurting himself, but is not actively suicidal.  He did contract with me today for safety and he thinks that he can be at home safely.  If he does worse he is going to call us or seek urgent help.  I am going to restart him on Prozac at 40 mg every other day for 1 week then increase to 40 mg daily and see him back in 2 weeks time.  Signed,  Maud Deed. Yukiko Minnich, MD

## 2018-08-08 ENCOUNTER — Ambulatory Visit: Payer: Self-pay | Admitting: Family Medicine

## 2018-08-08 ENCOUNTER — Encounter: Payer: Self-pay | Admitting: Family Medicine

## 2018-08-08 VITALS — BP 112/80 | HR 94 | Temp 97.6°F | Ht 69.75 in | Wt 239.8 lb

## 2018-08-08 DIAGNOSIS — F331 Major depressive disorder, recurrent, moderate: Secondary | ICD-10-CM

## 2018-08-08 DIAGNOSIS — I10 Essential (primary) hypertension: Secondary | ICD-10-CM

## 2018-08-08 MED ORDER — FLUOXETINE HCL 40 MG PO CAPS: 80.0000 mg | ORAL_CAPSULE | Freq: Every day | ORAL | 5 refills | Status: DC

## 2018-08-08 NOTE — Patient Instructions (Signed)
Go back on your fluoxetine.  Take one tablet every other day for 1 week, then increase to 1 tablet a day.  If you feel nauseated or get some diarrhea after increasing to 1 tablet a day, then continue with every other day until you see me again.

## 2018-08-20 NOTE — Progress Notes (Signed)
   Dr. Frederico Hamman T. Keven Soucy, MD, [facility] Primary Care and Sports Medicine [place], [address] Phone: [phone] Fax: [phone]  08/22/2018  Patient: NORMAN PIACENTINI, MRN: [id number], DOB: 1965-12-08, 53 y.o.  Primary Physician:  Owens Loffler, MD   Chief Complaint  Patient presents with  . Follow-up    Depression    The last time I saw him 08/08/2018, Skanda was having a lot of depression, and I wanted him to follow-up with me in 2 weeks.  Recheck: He is essentially about the same compared to the last time I saw him.  He has done a little bit of some help work with a Advertising account planner who is his neighbor, which is helped him get out of the house.  He is still pretty depressed.  Depressed about his overall situation.  He could not get the money together for his Prozac until 1 week ago.  He is currently taking 40 mg.  In this case, I think it is reasonable to do an aggressive dosing pattern.  No HI.  Occasional fleeting thought of SI.  He is contracted with me for safety.  He knows to call me immediately or get help if he needs additional help. F/u 4-5 weeks.  Signed,  Maud Deed. Janashia Parco, MD

## 2018-08-22 ENCOUNTER — Ambulatory Visit: Payer: Self-pay | Admitting: Family Medicine

## 2018-08-22 ENCOUNTER — Encounter: Payer: Self-pay | Admitting: Family Medicine

## 2018-08-22 VITALS — BP 118/74 | HR 80 | Temp 98.0°F | Ht 69.75 in | Wt 236.0 lb

## 2018-08-22 DIAGNOSIS — F331 Major depressive disorder, recurrent, moderate: Secondary | ICD-10-CM

## 2018-08-22 NOTE — Patient Instructions (Signed)
Keep taking your Prozac every day.  In 1 week, on next Wednesday, go ahead and increase it to 2 tablets a day.

## 2018-08-23 ENCOUNTER — Encounter: Payer: Self-pay | Admitting: Family Medicine

## 2018-09-16 NOTE — Progress Notes (Signed)
   Dr. Frederico Hamman T. Chrsitopher Wik, MD, [facility] Primary Care and Sports Medicine [place], [address] Phone: [phone] Fax: [phone]  09/19/2018  Patient: [name], MRN: [id number], DOB: [birth year], 53 y.o.  Primary Physician:  Owens Loffler, MD   Chief Complaint  Patient presents with  . Follow-up    Depression    [name] is here to follow-up on depression.  Had some SI 3-4 weeks ago - after increased prozac dose, this is much better now. Still does not want to do much of anything - decreased interest and some guilt.  Could not force himself to do much of anything. He does think he is doing better, no HI. Still not himself.   Took 160 of prozac for 2 days.   Prozac 80 not at full effect now, see him in 4 weeks again.  Signed,  Maud Deed. Layliana Devins, MD

## 2018-09-19 ENCOUNTER — Ambulatory Visit (INDEPENDENT_AMBULATORY_CARE_PROVIDER_SITE_OTHER): Payer: Self-pay | Admitting: Family Medicine

## 2018-09-19 ENCOUNTER — Encounter: Payer: Self-pay | Admitting: Family Medicine

## 2018-09-19 VITALS — BP 120/64 | HR 81 | Temp 97.7°F | Ht 69.75 in | Wt 229.5 lb

## 2018-09-19 DIAGNOSIS — F331 Major depressive disorder, recurrent, moderate: Secondary | ICD-10-CM

## 2018-10-17 ENCOUNTER — Other Ambulatory Visit: Payer: Self-pay

## 2018-10-17 ENCOUNTER — Encounter: Payer: Self-pay | Admitting: Family Medicine

## 2018-10-17 ENCOUNTER — Ambulatory Visit: Payer: Self-pay | Admitting: Family Medicine

## 2018-10-17 VITALS — BP 110/72 | HR 88 | Temp 97.9°F | Ht 69.75 in | Wt 222.2 lb

## 2018-10-17 DIAGNOSIS — F331 Major depressive disorder, recurrent, moderate: Secondary | ICD-10-CM

## 2018-10-17 MED ORDER — BUPROPION HCL 75 MG PO TABS: 75.0000 mg | ORAL_TABLET | Freq: Two times a day (BID) | ORAL | 2 refills | Status: DC

## 2018-10-17 NOTE — Patient Instructions (Signed)
Insurance claims handler

## 2018-10-17 NOTE — Progress Notes (Signed)
     Maryse Brierley T. Quentavious Rittenhouse, MD Primary Care and [place] at [facility] [place], [address] Phone: [phone]  FAX: [phone]  IRVEN INGALSBE - 53 y.o. male  MRN [id number]  Date of Birth: April 29, 1966  Visit Date: 10/17/2018  PCP: Owens Loffler, MD  Referred by: Owens Loffler, MD  Chief Complaint  Patient presents with  . Follow-up    Depression    F/u major depression.  Currently Prozac 80 mg and Trazodone for sleep, 50 mg.  No SI or HI. Anhedonia and crying.  Sleeping ok.  Some guilt.  No irritable.  Sort of more irritable.  Little energy. No substance.   Some anxiety.   [employer].  [place].  Letter for disability. Let know when ready to pick up.   Major depressive disorder, recurrent episode, moderate ([facility])  Doing better, no SI, but is still not at baseline with anhedonia and depressive features. Add wellbutrin bid with recheck.  Follow-up: Return in about 6 weeks (around 11/28/2018).  Meds ordered this encounter  Medications  . buPROPion (WELLBUTRIN) 75 MG tablet    Sig: Take 1 tablet (75 mg total) by mouth 2 (two) times daily.    Dispense:  60 tablet    Refill:  2   Signed,  Valerie Cones T. Milady Fleener, MD   Outpatient Encounter Medications as of 10/17/2018  Medication Sig  . EPINEPHRINE 0.3 mg/0.3 mL IJ SOAJ injection INJECT CONTENTS OF 1 SYRINGE INTO THE MUSCLE AS DIRECTED BY YOUR PHYSICIAN FOR ALLERGIC/ANAPHYLACTIC REACTION  . FLUoxetine (PROZAC) 40 MG capsule Take 2 capsules (80 mg total) by mouth daily.  . traZODone (DESYREL) 50 MG tablet Take 0.5-1 tablets (25-50 mg total) by mouth at bedtime as needed for sleep.  Marland Kitchen buPROPion (WELLBUTRIN) 75 MG tablet Take 1 tablet (75 mg total) by mouth 2 (two) times daily.   No facility-administered encounter medications on file as of 10/17/2018.

## 2018-11-28 ENCOUNTER — Encounter: Payer: Self-pay | Admitting: Family Medicine

## 2018-11-28 ENCOUNTER — Ambulatory Visit: Payer: Self-pay | Admitting: Family Medicine

## 2018-11-28 ENCOUNTER — Other Ambulatory Visit: Payer: Self-pay

## 2018-11-28 VITALS — BP 140/80 | HR 70 | Temp 97.6°F | Ht 69.75 in | Wt 220.5 lb

## 2018-11-28 DIAGNOSIS — F331 Major depressive disorder, recurrent, moderate: Secondary | ICD-10-CM

## 2018-11-28 MED ORDER — BUPROPION HCL ER (SR) 150 MG PO TB12: 150.0000 mg | ORAL_TABLET | Freq: Two times a day (BID) | ORAL | 5 refills | Status: DC

## 2018-11-28 NOTE — Progress Notes (Signed)
     Davidson Palmieri T. Elizebath Wever, MD Primary Care and [place] at [facility] [place], [address] Phone: [phone]  FAX: [phone]  [name] - 53 y.o. male  MRN [id number]  Date of Birth: 05-03-1966  Visit Date: 11/28/2018  PCP: Owens Loffler, MD  Referred by: Owens Loffler, MD  Chief Complaint  Patient presents with  . Follow-up    Depression   Subjective:   [name] is a 53 y.o. very pleasant male patient who presents with the following:  >5 minutes spent in face to face time with patient, >50% spent in counselling or coordination of care   Globally [name] is doing better.  He is still depressed, but he is not having any suicidality.  Primary issue right now is getting up in the morning and getting up and wanting to do things first thing in the morning.  In the afternoon he is doing much better.  He is interactive more and getting out of the house more.  Good interactions with his mother.  Continues to be abstinent from substance.  No suicidality or homicidality.  We can increase his Wellbutrin to 150 p.o. twice daily.  Major depressive disorder, recurrent episode, moderate ([place])  Follow-up: Return in about 3 months (around 02/28/2019).  Meds ordered this encounter  Medications  . buPROPion (WELLBUTRIN SR) 150 MG 12 hr tablet    Sig: Take 1 tablet (150 mg total) by mouth 2 (two) times daily.    Dispense:  60 tablet    Refill:  5    Signed,  Yuri Flener T. Hennie Gosa, MD   Outpatient Encounter Medications as of 11/28/2018  Medication Sig  . EPINEPHRINE 0.3 mg/0.3 mL IJ SOAJ injection INJECT CONTENTS OF 1 SYRINGE INTO THE MUSCLE AS DIRECTED BY YOUR PHYSICIAN FOR ALLERGIC/ANAPHYLACTIC REACTION  . FLUoxetine (PROZAC) 40 MG capsule Take 2 capsules (80 mg total) by mouth daily.  . traZODone (DESYREL) 50 MG tablet Take 0.5-1 tablets (25-50 mg total) by mouth at bedtime as needed for sleep.  . [DISCONTINUED]  buPROPion (WELLBUTRIN) 75 MG tablet Take 1 tablet (75 mg total) by mouth 2 (two) times daily.  Marland Kitchen buPROPion (WELLBUTRIN SR) 150 MG 12 hr tablet Take 1 tablet (150 mg total) by mouth 2 (two) times daily.   No facility-administered encounter medications on file as of 11/28/2018.

## 2018-12-04 ENCOUNTER — Encounter: Payer: Self-pay | Admitting: Family Medicine

## 2018-12-04 ENCOUNTER — Other Ambulatory Visit: Payer: Self-pay

## 2018-12-04 ENCOUNTER — Ambulatory Visit: Payer: Self-pay | Admitting: Family Medicine

## 2018-12-04 ENCOUNTER — Ambulatory Visit (INDEPENDENT_AMBULATORY_CARE_PROVIDER_SITE_OTHER): Payer: Self-pay | Admitting: Family Medicine

## 2018-12-04 VITALS — BP 130/80 | HR 85 | Temp 98.5°F | Ht 69.75 in | Wt 220.5 lb

## 2018-12-04 DIAGNOSIS — I10 Essential (primary) hypertension: Secondary | ICD-10-CM

## 2018-12-04 DIAGNOSIS — F331 Major depressive disorder, recurrent, moderate: Secondary | ICD-10-CM

## 2018-12-04 NOTE — Progress Notes (Signed)
   Subjective:    Patient ID: [name], male    DOB: Mar 18, 1966, 53 y.o.   MRN: [id number]  HPI  53 year old male pt of Dr. Lorelei Pont presents with recent fluctuating blood pressure.   Last week wellbutrin increased from 75 mg to 150 mg BID.Marland Kitchen. after 2 days BP was elevated at 188/133  BP has been good recently so had not been taking in last 6-8 months ( was on three in past) He took BP meds .. BP came down.  Took wellbutrin again... started having heaviness in both arms lasted all day, jaws hurt lasted all day  no exertional pain. NO CP, no SOB, no edema. Was tired and dizzy.  .. BP went down to 88/48.  He has gone back to 75 mg twice daily..  No further issues.Marland Kitchen no chest pain, no SOB, no more heaviness in armor jaw. He feels back to normal.   Social History /Family History/Past Medical History reviewed in detail and updated in EMR if needed. Blood pressure 130/80, pulse 85, temperature 98.5 F (36.9 C), temperature source Oral, height 5' 9.75" (1.772 m), weight 220 lb 8 oz (100 kg), SpO2 97 %.   Review of Systems  Constitutional: Negative for fatigue and fever.  HENT: Negative for ear pain.   Eyes: Negative for pain.  Respiratory: Negative for cough and shortness of breath.   Cardiovascular: Negative for chest pain, palpitations and leg swelling.  Gastrointestinal: Negative for abdominal pain.  Genitourinary: Negative for dysuria.  Musculoskeletal: Negative for arthralgias.  Neurological: Negative for syncope, light-headedness and headaches.  Psychiatric/Behavioral: Negative for dysphoric mood.       Objective:   Physical Exam Constitutional:      Appearance: He is well-developed.  HENT:     Head: Normocephalic.     Right Ear: Hearing normal.     Left Ear: Hearing normal.     Nose: Nose normal.  Neck:     Thyroid: No thyroid mass or thyromegaly.     Vascular: No carotid bruit.     Trachea: Trachea normal.  Cardiovascular:     Rate and Rhythm: Normal rate and  regular rhythm.     Pulses: Normal pulses.     Heart sounds: Heart sounds not distant. No murmur. No friction rub. No gallop.      Comments: No peripheral edema Pulmonary:     Effort: Pulmonary effort is normal. No respiratory distress.     Breath sounds: Normal breath sounds.  Skin:    General: Skin is warm and dry.     Findings: No rash.  Psychiatric:        Speech: Speech normal.        Behavior: Behavior normal.        Thought Content: Thought content normal.           Assessment & Plan:

## 2018-12-04 NOTE — Patient Instructions (Signed)
Stay on the wellbutrin 75 mg twice daily.  Call Dr. Lorelei Pont if mood is not continuing to improveing.  Go to ER if severe CP go to ER.

## 2018-12-04 NOTE — Assessment & Plan Note (Signed)
Pt reports tolerably controled at this point.. feeling well on 75 mg BID.  Pt will contact PCP if not continuing to improve.

## 2018-12-04 NOTE — Assessment & Plan Note (Signed)
Significant increase in BP and SE from wellbutrin.   Resolved when returned to previous dose.

## 2018-12-26 ENCOUNTER — Encounter: Payer: Self-pay | Admitting: Family Medicine

## 2018-12-26 ENCOUNTER — Ambulatory Visit (INDEPENDENT_AMBULATORY_CARE_PROVIDER_SITE_OTHER): Payer: Self-pay | Admitting: Family Medicine

## 2018-12-26 VITALS — BP 154/131 | HR 85 | Temp 97.2°F | Ht 69.75 in

## 2018-12-26 DIAGNOSIS — I998 Other disorder of circulatory system: Secondary | ICD-10-CM

## 2018-12-26 DIAGNOSIS — F331 Major depressive disorder, recurrent, moderate: Secondary | ICD-10-CM

## 2018-12-26 MED ORDER — MIRTAZAPINE 15 MG PO TABS: ORAL_TABLET | ORAL | 0 refills | Status: DC

## 2018-12-26 NOTE — Progress Notes (Signed)
Justin Nasser T. Justin Petruska, MD Primary Care and [name] [name], Justin Phone: [phone]  FAX: [phone]  [name] - 53 y.o. male  MRN [id number]  Date of Birth: 10/01/1965  Visit Date: 12/26/2018  PCP: Justin Loffler, MD  Referred by: Justin Loffler, MD Chief Complaint  Patient presents with  . Fluctuating Blood Pressure   Virtual Visit via Video Note:  I connected with  [name] on 12/26/2018  2:40 PM EDT by a video enabled telemedicine application and verified that I am speaking with the correct person using two identifiers.   Location patient: home computer, tablet, or smartphone Location provider: work or home office Consent: Verbal consent directly obtained from [name]. Persons participating in the virtual visit: patient, provider  I discussed the limitations of evaluation and management by telemedicine and the availability of in person appointments. The patient expressed understanding and agreed to proceed.  History of Present Illness:  BP: see below  D/c tea  remeron  Review of Systems as above: See pertinent positives and pertinent negatives per HPI No acute distress verbally  Past Medical History, Surgical History, Social History, Family History, Problem List, Medications, and Allergies have been reviewed and updated if relevant.   Observations/Objective/Exam:  An attempt was made to discern vital signs over the phone and per patient if applicable and possible.   General:    Alert, Oriented, appears well and in no acute distress HEENT:     Atraumatic, conjunctiva clear, no obvious abnormalities on inspection of external nose and ears.  Neck:    Normal movements of the head and neck Pulmonary:     On inspection no signs of respiratory distress, breathing rate appears normal, no obvious gross SOB, gasping or wheezing Cardiovascular:    No obvious cyanosis  Musculoskeletal:    Moves all visible extremities without noticeable abnormality Psych / Neurological:     Pleasant and cooperative, no obvious depression or anxiety, speech and thought processing grossly intact  Assessment and Plan:  Fluctuating blood pressure  Major depressive disorder, recurrent episode, moderate (HCC)  7 minutes spent in face to face time with patient, >50% spent in counselling or coordination of care:  [name] is having fluctuating blood pressures anywhere from the 1 10-180 over 70s to 130.  This is all gotten worse after starting his Wellbutrin, and he got dizzy this morning with a low blood pressure.  He also is drinking a gallon of tea a day.  He is not drinking much water.   He continues to be depressed, but he is better than he was before.  Challenging situation.  Prozac 80 mg.  I did have to stop his Wellbutrin with his fluctuating blood pressures and high blood pressures.  Start Remeron, follow-up 2 weeks.  Monitor blood pressures at home.  I discussed the assessment and treatment plan with the patient. The patient was provided an opportunity to ask questions and all were answered. The patient agreed with the plan and demonstrated an understanding of the instructions.   The patient was advised to call back or seek an in-person evaluation if the symptoms worsen or if the condition fails to improve as anticipated.  Follow-up: prn unless noted otherwise below No follow-ups on file.  Meds ordered this encounter  Medications  . mirtazapine (REMERON) 15 MG tablet    Sig: 1 tab po for 1 week QHS, then increase to 2 tabs  po QHS    Dispense:  60 tablet    Refill:  0   No orders of the defined types were placed in this encounter.   Signed,  Maud Deed. Croix Presley, MD

## 2019-01-09 ENCOUNTER — Ambulatory Visit (INDEPENDENT_AMBULATORY_CARE_PROVIDER_SITE_OTHER): Payer: Self-pay | Admitting: Family Medicine

## 2019-01-09 ENCOUNTER — Other Ambulatory Visit: Payer: Self-pay

## 2019-01-09 ENCOUNTER — Encounter: Payer: Self-pay | Admitting: Family Medicine

## 2019-01-09 VITALS — BP 140/80 | HR 67 | Temp 97.6°F | Ht 69.75 in | Wt 226.5 lb

## 2019-01-09 DIAGNOSIS — F331 Major depressive disorder, recurrent, moderate: Secondary | ICD-10-CM

## 2019-01-09 DIAGNOSIS — I10 Essential (primary) hypertension: Secondary | ICD-10-CM

## 2019-01-09 MED ORDER — MIRTAZAPINE 45 MG PO TABS: ORAL_TABLET | ORAL | 2 refills | Status: DC

## 2019-01-09 MED ORDER — AMLODIPINE BESYLATE 5 MG PO TABS: 5.0000 mg | ORAL_TABLET | Freq: Every day | ORAL | 2 refills | Status: DC

## 2019-01-09 NOTE — Patient Instructions (Signed)
Stop the 15 mg tablets of the mirtazipine.  Start the 45 mg tablets of mirtazipine at night
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
## 2019-01-17 ENCOUNTER — Telehealth: Payer: Self-pay

## 2019-01-17 MED ORDER — LEVOFLOXACIN 500 MG PO TABS: 500.0000 mg | ORAL_TABLET | Freq: Every day | ORAL | 0 refills | Status: AC

## 2019-01-17 NOTE — Telephone Encounter (Signed)
Puncture plant through shoe.  LVQ  Mother told me he was passing out this morning, BP 50/30.  D/c norvasc. Quite labile BP's

## 2019-01-17 NOTE — Telephone Encounter (Signed)
Pt stepped on rusty nail today at 2 pm; nail went up through tennis shoe with thin sole. Pt said nail may have gone up into rt foot 1"; the foot is little sore where nail went in; no bleeding or drainage now and pt is going to soak in basin with water and betadine(if no allergy to betadine) and then do a bandaid with abx ointment on it. Any signs of redness,swelling,warmth, drainage or pain pt will go to UC this weekend. Will contact [facility] if problems next week. Pt said in 1980s he stepped on a rusty nail and was in hospital 1 wk due to a piece of rubbersole from tennis shoe going up into foot.pt had TD on 11/23/2017. FYI to Dr Lorelei Pont.

## 2019-02-07 ENCOUNTER — Telehealth: Payer: Self-pay

## 2019-02-07 NOTE — Telephone Encounter (Signed)
Pt called back to see if could get sooner appt with Dr Damita Dunnings since several family members are pts of Dr Damita Dunnings. Pt does not have any problem seeing Dr Diona Browner though. Dr Damita Dunnings could not see pt tomorrow so I offered an appt with Dr Diona Browner in AM. Pt said no to leave the appt with Dr Diona Browner 02/08/19 at 3:40. Pt said 2 days ago BP 150 something over 132. Now BP is 164/91.pt thinks Dr Lorelei Pont stopped amlodipine 5 mg 2 wks ago. Per 01/09/19 office note there was a notation add low dose Norvasc. I could not see a note that amlodipine was stopped.Amlodipine still on med list.Pt has dizziness with room spinning on and off 2-3 days this wk. No H/A,CP, SOB.Pt has no covid symptoms, no travel and no known exposure to + covid. Pt taking Prozac 40 mg taking 2 caps at lunch time. Pt tries to remember to take daily but sometimes he forgets. Pt cannot remember the last time he forgot to take his med. Pt said the wellbutrin was stopped. When asked if pt was SI or HI; pt said not usually but this wk yes; pt has had thoughts about harming himself but has not thought about a plan to harm himself and he said he would never harm anyone else. Pt said he does not need to be hospitalized. I advised pt with his BP, dizziness with room spinning and possible SI pt needs to go to UC or ED. Pt said he was not trying to give me a hard time but he was not going to hospital or UC. Pt said it was the other day when he thought about hurting himself; he has not had those kind of thoughts today. Pt still says he does not need to go to UC or ED. ED precautions given and pt voiced understanding.pt said he would be OK until he saw Dr Diona Browner tomorrow. Again reviewed if BP goes up, H/a, dizziness worsens, CP or SOB or has SI/HI thoughts to go to [facility] or ED. Pt said he understood and appreciated the time I took to talk to pt. FYI to Dr Diona Browner.

## 2019-02-07 NOTE — Telephone Encounter (Signed)
Agree with plan of care

## 2019-02-08 ENCOUNTER — Other Ambulatory Visit: Payer: Self-pay

## 2019-02-08 ENCOUNTER — Ambulatory Visit (INDEPENDENT_AMBULATORY_CARE_PROVIDER_SITE_OTHER): Payer: Self-pay | Admitting: Family Medicine

## 2019-02-08 ENCOUNTER — Encounter: Payer: Self-pay | Admitting: Family Medicine

## 2019-02-08 DIAGNOSIS — I10 Essential (primary) hypertension: Secondary | ICD-10-CM

## 2019-02-08 DIAGNOSIS — F331 Major depressive disorder, recurrent, moderate: Secondary | ICD-10-CM

## 2019-02-08 MED ORDER — DULOXETINE HCL 60 MG PO CPEP: 60.0000 mg | ORAL_CAPSULE | Freq: Every day | ORAL | 3 refills | Status: DC

## 2019-02-08 NOTE — Assessment & Plan Note (Signed)
Difficult situation. Very poor control... was doing better of wellbutrin but this may have been effecting BP. He has chronic pain that contributes to his mood issue.Marland Kitchen on max prozac. .. will ry change to cymbalta.. straight to 60 mg daily given severity. Pt refuses referral to psychiatrist or psychologist given cost.  Remeron has made him very sleepy and he is sleeping all day. This medication may need to be stopped. He has very limited or zero support at home.  Given suicidal ideation.. will CC PCP and have him follow up closely.  Pt given helpline number and was able to verbalize no suicidal ideation and plan for safety.

## 2019-02-08 NOTE — Assessment & Plan Note (Signed)
Well controlled in office today. ? Quality of home meter.

## 2019-02-08 NOTE — Patient Instructions (Addendum)
Stop prozac.  Start cymbalta 60 mg daily.  Continue remeron.and trazodone but may want to discuss with Dr. Loletha Grayer about changing  Help line [phone]

## 2019-02-08 NOTE — Progress Notes (Signed)
Chief Complaint  Patient presents with  . Depression    History of Present Illness: HPI   53 year old male patient of Dr. Lorelei Pont  With MDD presents with worsening depression. He is currently on prozac 80 mg daily, Remeron 45 mg and trazodone for sleep. He last saw Dr. Loletha Grayer on 6/10 when wellbutrin was stopped (given fluctuating BPs) and changed to remeron ON 01/09/2019 reported he was doing better on this regimen. Today he reports last week he has been more depressed and some thoughts of suicide.  he feels like everyone would be better off dead... he feels worthless.  He denies suicidal plan.  He also reports his BP has been high lately at home ( 164/91) but in office BP looks good on amlodipine.  Does have old cuff. Occ dizziness off and on. Arms heavy... both in last 2-3 weeks. BP Readings from Last 3 Encounters:  02/08/19 130/80  01/09/19 140/80  12/26/18 (!) 154/131   Working on disability. No insurance cannot afford counselor.  Chronic pain in left foot.  Depression screen [facility] 2/9 02/08/2019 02/07/2018 11/23/2017  Decreased Interest 3 0 0  Down, Depressed, Hopeless 3 1 3   PHQ - 2 Score 6 1 3   Altered sleeping 3 - 3  Tired, decreased energy 3 - 3  Change in appetite 3 - 0  Feeling bad or failure about yourself  3 - 3  Trouble concentrating 2 - 0  Moving slowly or fidgety/restless 2 - 2  Suicidal thoughts 1 - 1  PHQ-9 Score 23 - 15  Difficult doing work/chores Somewhat difficult - Extremely dIfficult    COVID 19 screen No recent travel or known exposure to COVID19 The patient denies respiratory symptoms of COVID 19 at this time.  The importance of social distancing was discussed today.   Review of Systems  Constitutional: Negative for chills and fever.  HENT: Negative for congestion and ear pain.   Eyes: Negative for pain and redness.  Respiratory: Negative for cough and shortness of breath.   Cardiovascular: Negative for chest pain, palpitations and leg swelling.   Gastrointestinal: Negative for abdominal pain, blood in stool, constipation, diarrhea, nausea and vomiting.  Genitourinary: Negative for dysuria.  Musculoskeletal: Negative for falls and myalgias.  Skin: Negative for rash.  Neurological: Negative for dizziness.  Psychiatric/Behavioral: Negative for depression. The patient is not nervous/anxious.       Past Medical History:  Diagnosis Date  . Acute kidney failure ([place])    after last surgery  . Alcohol abuse, in remission 11/03/2015  . Arthritis    "'about qwhere" (02/25/2016)  . Chronic lower back pain   . Cocaine abuse in remission ([place]) 11/03/2015  . Depression    takes Prozac daily  . GERD (gastroesophageal reflux disease)    occasionally and will take Rolaid if needed  . Hemorrhoids   . History of kidney stones   . History of MRSA infection    several yrs ago  . Hypertension    takes Lisinopril-HCTZ and AMlodipine daily  . OSA on CPAP    study was done 3 yrs ago.  Wears a cpap  . Polysubstance dependence, non-opioid, in remission ([place])   . RSD (reflex sympathetic dystrophy), L foot due to calcaneus fx 08/14/2012    reports that he has been smoking cigarettes. He has a 18.00 pack-year smoking history. He has quit using smokeless tobacco.  His smokeless tobacco use included chew and snuff. He reports previous alcohol use. He reports previous drug  use. Drugs: Cocaine and Marijuana.   Current Outpatient Medications:  .  EPINEPHRINE 0.3 mg/0.3 mL IJ SOAJ injection, INJECT CONTENTS OF 1 SYRINGE INTO THE MUSCLE AS DIRECTED BY YOUR PHYSICIAN FOR ALLERGIC/ANAPHYLACTIC REACTION, Disp: 2 Device, Rfl: 0 .  FLUoxetine (PROZAC) 40 MG capsule, Take 2 capsules (80 mg total) by mouth daily., Disp: 60 capsule, Rfl: 5 .  mirtazapine (REMERON) 45 MG tablet, 1 tab po QHS, Disp: 30 tablet, Rfl: 2 .  traZODone (DESYREL) 50 MG tablet, Take 0.5-1 tablets (25-50 mg total) by mouth at bedtime as needed for sleep., Disp: 30 tablet, Rfl: 5 .  amLODipine  (NORVASC) 5 MG tablet, Take 1 tablet (5 mg total) by mouth daily. (Patient not taking: Reported on 02/08/2019), Disp: 30 tablet, Rfl: 2   Observations/Objective: Blood pressure 130/80, pulse 80, temperature 98.3 F (36.8 C), temperature source Temporal, height 5' 8.75" (1.746 m), weight 225 lb (102.1 kg), SpO2 96 %.  Physical Exam Constitutional:      Appearance: He is well-developed.  HENT:     Head: Normocephalic.     Right Ear: Hearing normal.     Left Ear: Hearing normal.     Nose: Nose normal.  Neck:     Thyroid: No thyroid mass or thyromegaly.     Vascular: No carotid bruit.     Trachea: Trachea normal.  Cardiovascular:     Rate and Rhythm: Normal rate and regular rhythm.     Pulses: Normal pulses.     Heart sounds: Heart sounds not distant. No murmur. No friction rub. No gallop.      Comments: No peripheral edema Pulmonary:     Effort: Pulmonary effort is normal. No respiratory distress.     Breath sounds: Normal breath sounds.  Skin:    General: Skin is warm and dry.     Findings: No rash.  Psychiatric:        Mood and Affect: Mood is depressed. Affect is blunt.        Speech: Speech is delayed.        Behavior: Behavior is slowed and withdrawn.        Thought Content: Thought content is not paranoid. Thought content includes suicidal ideation. Thought content does not include homicidal ideation. Thought content does not include homicidal or suicidal plan.        Cognition and Memory: Cognition is impaired. Memory is impaired.        Judgment: Judgment is inappropriate.     Comments:  He states " I am lost".. cannot make any descision or remember anything      Assessment and Plan   HTN (hypertension), benign Well controlled in office today. ? Quality of home meter.  Major depressive disorder, recurrent episode, moderate (HCC) Difficult situation. Very poor control... was doing better of wellbutrin but this may have been effecting BP. He has chronic pain that  contributes to his mood issue.Marland Kitchen on max prozac. .. will ry change to cymbalta.. straight to 60 mg daily given severity. Pt refuses referral to psychiatrist or psychologist given cost.  Remeron has made him very sleepy and he is sleeping all day. This medication may need to be stopped. He has very limited or zero support at home.  Given suicidal ideation.. will CC PCP and have him follow up closely.  Pt given helpline number and was able to verbalize no suicidal ideation and plan for safety.     Eliezer Lofts, MD

## 2019-02-14 ENCOUNTER — Other Ambulatory Visit: Payer: Self-pay

## 2019-02-14 ENCOUNTER — Ambulatory Visit (INDEPENDENT_AMBULATORY_CARE_PROVIDER_SITE_OTHER): Payer: Self-pay | Admitting: Family Medicine

## 2019-02-14 DIAGNOSIS — F332 Major depressive disorder, recurrent severe without psychotic features: Secondary | ICD-10-CM

## 2019-02-14 MED ORDER — OLANZAPINE 5 MG PO TABS: 5.0000 mg | ORAL_TABLET | Freq: Every day | ORAL | 1 refills | Status: DC

## 2019-02-14 NOTE — Progress Notes (Signed)
Justin Corrow T. Denita Lun, MD Primary Care and Sports Medicine [facility] [place], [address] Phone: [phone]  FAX: [phone]  [name] - 53 y.o. male  MRN [id number]  Date of Birth: 1965-10-11  Visit Date: 02/14/2019  PCP: Owens Loffler, MD  Referred by: Owens Loffler, MD  Virtual Visit via Telephone Note:  I connected with  [name] on 02/14/2019  2:40 PM EDT by telephone and verified that I am speaking with the correct person using two identifiers.   Location patient: home phone or cell phone Location provider: work or home office Consent: Verbal consent directly obtained from [name] and that there may be a patient responsible charge related to this service. Persons participating in the virtual visit: patient, provider  I discussed the limitations of evaluation and management by telemedicine and the availability of in person appointments.  The patient expressed understanding and agreed to proceed.     History of Present Illness:  He has done globally poorly for a long time after stopping his prozac some time ago and failed multiple meds and has not been at a point where he wanted to go to psychiatry or psychology. He has had some fleeting thoughts of self harm in the past.  Sleeping Energy Guilty Weight neutral No / minimal No hi  Psych Stop remeron and trazodone  zyprexa  Review of Systems: pertinent positives and pertinent negatives as per HPI No acute distress verbally  Past Medical History, Surgical History, Social History, Family History, Problem List, Medications, and Allergies have been reviewed and updated if relevant.   Observations/Objective/Exam:  An attempt was made to discern vital signs over the phone and per patient if applicable and possible.   Pulmonary:     Effort: Pulmonary effort is normal. No respiratory distress.  Neurological:     Mental Status: He is alert and  oriented to person, place, and time.  Psychiatric:        Thought Content: Thought content normal.        Judgment: Judgment normal.   Assessment and Plan:    ICD-10-CM   1. Severe episode of recurrent major depressive disorder, without psychotic features ([place])  F33.2 Ambulatory referral to Psychiatry   >10 minutes spent in face to face time with patient, >50% spent in counselling or coordination of care   [place] is not at a good place. I have gotten him to agree to go see psychiatry.  Cont SNRI, but he is doing not well on Cymbalta 60.  I am going to have him stop remeron and trazodone.   Having failed multiple therapies, I am going to put him on an anti-psychotic.  I hope that zyprexa can offer him some relatively fast relief.  I discussed the assessment and treatment plan with the patient. The patient was provided an opportunity to ask questions and all were answered. The patient agreed with the plan and demonstrated an understanding of the instructions.   The patient was advised to call back or seek an in-person evaluation if the symptoms worsen or if the condition fails to improve as anticipated.  Follow-up: prn unless noted otherwise below No follow-ups on file.  Meds ordered this encounter  Medications  . OLANZapine (ZYPREXA) 5 MG tablet    Sig: Take 1 tablet (5 mg total) by mouth at bedtime.    Dispense:  30 tablet    Refill:  1   Orders Placed  This Encounter  Procedures  . Ambulatory referral to Psychiatry    Signed,  Frederico Hamman T. Edmund Holcomb, MD

## 2019-02-15 ENCOUNTER — Encounter: Payer: Self-pay | Admitting: Family Medicine

## 2019-02-15 NOTE — Telephone Encounter (Signed)
Pt called back and discussed [facility] information.  He stated he will call them today and discuss making an appt with them.  Pt assured me he is doing ok today and will try to get in w/[facility] on Monday.

## 2019-02-15 NOTE — Telephone Encounter (Signed)
Outstanding work.

## 2019-02-15 NOTE — Telephone Encounter (Signed)
Spoke w/pt.  He was on his way out to help a neighbor.  Pt gave verbal permission to speak w/his mother.  Also advised next time he comes in to sign release to speak w/mother in future.  Pt doing better today but will go to see someone for his depression.  Relayed to mother pt needs to call to set up appt/walk in appt for Monday w/[facility] in [place]@[phone], [address], [place], [place].  This location is for [employer] residents only.  Pt's mother agrees to relay info to pt and have him call/go to [place] on Monday.  She will also call us or have pt call us to ensure he receives this information and that he is ok to wait to see them on Monday.

## 2019-02-20 ENCOUNTER — Telehealth: Payer: Self-pay | Admitting: Family Medicine

## 2019-02-20 NOTE — Telephone Encounter (Signed)
Pt had initial evaluation 02-20-2019@[facility]  [facility].  Pt filled out release should they need any records from [place].  Per pt's mother, pt gave verbal permission and will do written permission never OV, pt seems to be do better and is happy he was able to talk w/someone.

## 2019-02-20 NOTE — Telephone Encounter (Signed)
This is great news.

## 2019-02-25 ENCOUNTER — Telehealth: Payer: Self-pay | Admitting: Family Medicine

## 2019-02-25 NOTE — Telephone Encounter (Signed)
Please call [name] and find out more information.  Does he have a question?

## 2019-02-25 NOTE — Telephone Encounter (Signed)
Pt c/o severe frontal headache w/o aura that started on Saturday.  He took aspirin and eventually went away.  Pt w/dizziness when he sits up x 2 to 3 weeks.  Dramamine helps some.

## 2019-02-26 NOTE — Telephone Encounter (Signed)
Dizzy spells when getting out of bed in the morning for the last 2 to 3 weeks.  Dizziness last about a hour when it occurs.  Saturday he got a terrible headache which he had never had before. He took and aspirin and the headache went away.  Denies any nausea or vomiting.  Dramamine helps.  He has been taking that when he has the dizzy spells.  Patient states he is drinking plenty on water and eating good.  Please advise.

## 2019-02-27 ENCOUNTER — Ambulatory Visit: Payer: Self-pay | Admitting: Family Medicine

## 2019-02-27 NOTE — Telephone Encounter (Signed)
[name] notified as instructed by telephone.  Patient states understanding.

## 2019-02-27 NOTE — Telephone Encounter (Signed)
Please call  This sounds like vertigo - dramamine is a good idea.  Normally it gets better with time.  His blood pressure has been out of control up and down, so reasonable to check that.  Most likely vertigo from the sound of it and helps with dramamine

## 2019-04-07 NOTE — Progress Notes (Signed)
     Justin Divirgilio T. Trung Wenzl, MD Primary Care and [place] at [facility] [place], [address] Phone: [phone]  FAX: [phone]  [name] - 53 y.o. male  MRN [id number]  Date of Birth: [birth year]  Visit Date: 04/08/2019  PCP: Justin Loffler, MD  Referred by: Justin Loffler, MD  Chief Complaint  Patient presents with  . Follow-up    Depression/HTN   Subjective:   [name] is a 53 y.o. very pleasant male patient who presents with the following:  >5 minutes spent in face to face time with patient, >50% spent in counselling or coordination of care  [name] is here from f/u of his severe depression.  Thankfully he has been able to connect with psychiatry. Feeling depressed not at all now.  Back to feeling ok Seeing psych now  [name] is really doing much better.  I am in a hold his blood pressure medicine as this is normalized now.  Depression is essentially asymptomatic now.  Doing very well on his medication and seeing psychiatry regularly and routinely.    ICD-10-CM   1. Need for influenza vaccination  Z23 Flu Vaccine QUAD 6+ mos PF IM (Fluarix Quad PF)     Follow-up: Return in about 1 year (around 04/07/2020) for complete physical.  No orders of the defined types were placed in this encounter.  Orders Placed This Encounter  Procedures  . Flu Vaccine QUAD 6+ mos PF IM (Fluarix Quad PF)    Signed,  Skeeter Sheard T. Estiben Mizuno, MD   Outpatient Encounter Medications as of 04/08/2019  Medication Sig  . DULoxetine (CYMBALTA) 60 MG capsule Take 1 capsule (60 mg total) by mouth daily.  Marland Kitchen EPINEPHRINE 0.3 mg/0.3 mL IJ SOAJ injection INJECT CONTENTS OF 1 SYRINGE INTO THE MUSCLE AS DIRECTED BY YOUR PHYSICIAN FOR ALLERGIC/ANAPHYLACTIC REACTION  . OLANZapine (ZYPREXA) 5 MG tablet Take 1 tablet (5 mg total) by mouth at bedtime.  . [DISCONTINUED] amLODipine (NORVASC) 5 MG tablet Take 1 tablet (5 mg total) by mouth daily.  (Patient not taking: Reported on 02/08/2019)   No facility-administered encounter medications on file as of 04/08/2019.

## 2019-04-08 ENCOUNTER — Other Ambulatory Visit: Payer: Self-pay

## 2019-04-08 ENCOUNTER — Ambulatory Visit (INDEPENDENT_AMBULATORY_CARE_PROVIDER_SITE_OTHER): Payer: Self-pay | Admitting: Family Medicine

## 2019-04-08 ENCOUNTER — Encounter: Payer: Self-pay | Admitting: Family Medicine

## 2019-04-08 VITALS — BP 140/82 | HR 78 | Temp 98.0°F | Ht 69.75 in | Wt 228.5 lb

## 2019-04-08 DIAGNOSIS — F331 Major depressive disorder, recurrent, moderate: Secondary | ICD-10-CM

## 2019-04-08 DIAGNOSIS — Z23 Encounter for immunization: Secondary | ICD-10-CM

## 2019-04-08 NOTE — Patient Instructions (Signed)
Get the old "drowsy" Dramamine   dimenhydrinate

## 2019-04-16 ENCOUNTER — Telehealth: Payer: Self-pay | Admitting: Family Medicine

## 2019-04-16 NOTE — Telephone Encounter (Signed)
Patient's mom is calling to follow up on a letter that they are needing for the patient's disability  She stated that on Monday you advised them that you would needed to revise the letter/ They wanted to know if this was completed for them to pick up so they could get it to the social security office next week.

## 2019-04-17 ENCOUNTER — Other Ambulatory Visit: Payer: Self-pay | Admitting: Family Medicine

## 2019-04-17 NOTE — Telephone Encounter (Signed)
[name] notified that letter is ready to be picked up at the front desk.

## 2019-04-17 NOTE — Progress Notes (Signed)
error 

## 2019-04-17 NOTE — Telephone Encounter (Signed)
ready

## 2019-04-26 ENCOUNTER — Emergency Department (HOSPITAL_COMMUNITY): Payer: Self-pay

## 2019-04-26 ENCOUNTER — Other Ambulatory Visit: Payer: Self-pay

## 2019-04-26 ENCOUNTER — Telehealth: Payer: Self-pay

## 2019-04-26 ENCOUNTER — Encounter (HOSPITAL_COMMUNITY): Payer: Self-pay

## 2019-04-26 ENCOUNTER — Emergency Department (HOSPITAL_COMMUNITY)
Admission: EM | Admit: 2019-04-26 | Discharge: 2019-04-26 | Disposition: A | Discharge status: Home / Self Care | Payer: Self-pay | Attending: Emergency Medicine | Admitting: Emergency Medicine

## 2019-04-26 DIAGNOSIS — F17210 Nicotine dependence, cigarettes, uncomplicated: Secondary | ICD-10-CM | POA: Insufficient documentation

## 2019-04-26 DIAGNOSIS — Z91040 Latex allergy status: Secondary | ICD-10-CM | POA: Insufficient documentation

## 2019-04-26 DIAGNOSIS — R55 Syncope and collapse: Secondary | ICD-10-CM | POA: Insufficient documentation

## 2019-04-26 DIAGNOSIS — R42 Dizziness and giddiness: Secondary | ICD-10-CM | POA: Insufficient documentation

## 2019-04-26 DIAGNOSIS — Z79899 Other long term (current) drug therapy: Secondary | ICD-10-CM | POA: Insufficient documentation

## 2019-04-26 DIAGNOSIS — I10 Essential (primary) hypertension: Secondary | ICD-10-CM | POA: Insufficient documentation

## 2019-04-26 LAB — URINALYSIS, ROUTINE W REFLEX MICROSCOPIC
Bilirubin Urine: NEGATIVE
Glucose, UA: NEGATIVE mg/dL
Hgb urine dipstick: NEGATIVE
Ketones, ur: NEGATIVE mg/dL
Leukocytes,Ua: NEGATIVE
Nitrite: NEGATIVE
Protein, ur: NEGATIVE mg/dL
Specific Gravity, Urine: 1.021 (ref 1.005–1.030)
pH: 5 (ref 5.0–8.0)

## 2019-04-26 LAB — BASIC METABOLIC PANEL
Anion gap: 6 (ref 5–15)
BUN: 15 mg/dL (ref 6–20)
CO2: 25 mmol/L (ref 22–32)
Calcium: 8.8 mg/dL — ABNORMAL LOW (ref 8.9–10.3)
Chloride: 109 mmol/L (ref 98–111)
Creatinine, Ser: 1.17 mg/dL (ref 0.61–1.24)
GFR calc Af Amer: >60 mL/min (ref >60)
GFR calc non Af Amer: >60 mL/min (ref >60)
Glucose, Bld: 96 mg/dL (ref 70–99)
Potassium: 4.1 mmol/L (ref 3.5–5.1)
Sodium: 140 mmol/L (ref 135–145)

## 2019-04-26 LAB — CBC
HCT: 49.4 % (ref 39.0–52.0)
Hemoglobin: 16.2 g/dL (ref 13.0–17.0)
MCH: 30 pg (ref 26.0–34.0)
MCHC: 32.8 g/dL (ref 30.0–36.0)
MCV: 91.5 fL (ref 80.0–100.0)
Platelets: 195 10*3/uL (ref 150–400)
RBC: 5.4 MIL/uL (ref 4.22–5.81)
RDW: 12.7 % (ref 11.5–15.5)
WBC: 6 10*3/uL (ref 4.0–10.5)
nRBC: 0 % (ref 0.0–0.2)

## 2019-04-26 LAB — CBG MONITORING, ED: Glucose-Capillary: 106 mg/dL — ABNORMAL HIGH (ref 70–99)

## 2019-04-26 MED ORDER — SODIUM CHLORIDE 0.9% FLUSH: 3.0000 mL | Freq: Once | INTRAVENOUS | Status: DC

## 2019-04-26 NOTE — Discharge Instructions (Addendum)
Follow-up with cardiology to discuss the possibility of Holter monitoring.  The contact information for the [facility] cardiology clinic has been provided in this discharge summary for you to call and make these arrangements.  Return to the emergency department in the meantime if symptoms significantly worsen or change.

## 2019-04-26 NOTE — Telephone Encounter (Signed)
pts mom calling and DPR has not to speak with anyone but his mom is with pt and he said I could talk with his mom. For 2 days pt has fallen x 2 with no warning; pt has been dizzy and speech is slightly slurred. Pt is not having problems walking now but pts neighbors told his mom when they came over to help pt up on 04/25/19 pt did have difficulty in walking.After each fall pt has a severe H/A and some confusion.no H/A or confusion now. Pt is not sure if he blacks out or not when falls.BP now is 170/117. Pt denies 911 but will let his mom take him to [facility] ED now for eval and possible imaging. FYI to Dr Lorelei Pont.
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
## 2019-04-26 NOTE — ED Triage Notes (Signed)
Patient c/o "dizzy spells x 2 days. Patient states at times he feels like he is going to pass out."  Patient states he gets a "headache when the dizziness is really bad"  Patient states he went to his PCP when the dizziness began 1 1/2 months ago and was told to take Dramamine.  Patient states that helped some.

## 2019-04-29 ENCOUNTER — Telehealth: Payer: Self-pay | Admitting: *Deleted

## 2019-04-29 NOTE — Telephone Encounter (Signed)
Patient called stating that he was sent to the ER Friday because of elevated blood pressure. Patient stated that when he went to the ER they did an EKG, lab work and CT scan. Patient stated yesterday his blood pressure was 153/109. Patient checked his blood pressure while on the the phone and it was 116/87 this morning. Patient stated that he is not having any symptoms this morning. ER precautions given to patient and he verbalized understanding. Appointment scheduled with Dr. Lorelei Pont 05/01/19 for an ER follow-up. Advised patient to bring his blood pressure machine with him at his appointment for it to be checked.

## 2019-05-01 ENCOUNTER — Ambulatory Visit: Payer: Self-pay | Admitting: Family Medicine
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
## 2019-05-06 ENCOUNTER — Ambulatory Visit (INDEPENDENT_AMBULATORY_CARE_PROVIDER_SITE_OTHER): Payer: Self-pay | Admitting: Family Medicine

## 2019-05-06 ENCOUNTER — Other Ambulatory Visit: Payer: Self-pay

## 2019-05-06 ENCOUNTER — Encounter: Payer: Self-pay | Admitting: Family Medicine

## 2019-05-06 VITALS — BP 138/70 | HR 96 | Temp 98.4°F | Ht 69.75 in | Wt 237.5 lb

## 2019-05-06 DIAGNOSIS — R55 Syncope and collapse: Secondary | ICD-10-CM

## 2019-05-06 DIAGNOSIS — F1411 Cocaine abuse, in remission: Secondary | ICD-10-CM

## 2019-05-06 NOTE — Patient Instructions (Addendum)
Call psychiatry - check on which medicine you are supposed to be on.   Cardiology will be calling you.
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
## 2019-05-07 ENCOUNTER — Telehealth: Payer: Self-pay | Admitting: Family Medicine

## 2019-05-07 NOTE — Telephone Encounter (Signed)
[phone] Dr. Arnold Long psychiatrist [facility] in [employer]  Would like a call back to discuss pt's medicines.

## 2019-05-07 NOTE — Telephone Encounter (Signed)
I spoke to Dr. B and I definitely want her to manage all psychiatric medication.  [name] had seemed very confused about what he was taking yesterday.  He has follow-up this week.

## 2019-05-10 ENCOUNTER — Encounter: Payer: Self-pay | Admitting: Cardiology

## 2019-05-10 ENCOUNTER — Other Ambulatory Visit: Payer: Self-pay

## 2019-05-10 ENCOUNTER — Encounter (INDEPENDENT_AMBULATORY_CARE_PROVIDER_SITE_OTHER): Payer: Self-pay

## 2019-05-10 ENCOUNTER — Ambulatory Visit (INDEPENDENT_AMBULATORY_CARE_PROVIDER_SITE_OTHER): Payer: Self-pay | Admitting: Cardiology

## 2019-05-10 VITALS — BP 146/84 | HR 80 | Ht 69.75 in | Wt 239.8 lb

## 2019-05-10 DIAGNOSIS — R55 Syncope and collapse: Secondary | ICD-10-CM
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
## 2019-05-10 NOTE — Patient Instructions (Signed)
Medication Instructions:  The current medical regimen is effective;  continue present plan and medications.  *If you need a refill on your cardiac medications before your next appointment, please call your pharmacy*  Testing/Procedures: Your physician has requested that you have an echocardiogram. Echocardiography is a painless test that uses sound waves to create images of your heart. It provides your doctor with information about the size and shape of your heart and how well your heart's chambers and valves are working. This procedure takes approximately one hour. There are no restrictions for this procedure.  Your physician has recommended that you wear a holter monitor for 14 days. Holter monitors are medical devices that record the heart's electrical activity. Doctors most often use these monitors to diagnose arrhythmias. Arrhythmias are problems with the speed or rhythm of the heartbeat. The monitor is a small, portable device. You can wear one while you do your normal daily activities. This is usually used to diagnose what is causing palpitations/syncope (passing out).  Follow-Up: Further follow up will be based on the results of the above testing.  Thank you for choosing [facility]!!      Echocardiogram An echocardiogram is a procedure that uses painless sound waves (ultrasound) to produce an image of the heart. Images from an echocardiogram can provide important information about:  Signs of coronary artery disease (CAD).  Aneurysm detection. An aneurysm is a weak or damaged part of an artery wall that bulges out from the normal force of blood pumping through the body.  Heart size and shape. Changes in the size or shape of the heart can be associated with certain conditions, including heart failure, aneurysm, and CAD.  Heart muscle function.  Heart valve function.  Signs of a past heart attack.  Fluid buildup around the heart.  Thickening of the heart muscle.   A tumor or infectious growth around the heart valves. Tell a health care provider about:  Any allergies you have.  All medicines you are taking, including vitamins, herbs, eye drops, creams, and over-the-counter medicines.  Any blood disorders you have.  Any surgeries you have had.  Any medical conditions you have.  Whether you are pregnant or may be pregnant. What are the risks? Generally, this is a safe procedure. However, problems may occur, including:  Allergic reaction to dye (contrast) that may be used during the procedure. What happens before the procedure? No specific preparation is needed. You may eat and drink normally. What happens during the procedure?   An IV tube may be inserted into one of your veins.  You may receive contrast through this tube. A contrast is an injection that improves the quality of the pictures from your heart.  A gel will be applied to your chest.  A wand-like tool (transducer) will be moved over your chest. The gel will help to transmit the sound waves from the transducer.  The sound waves will harmlessly bounce off of your heart to allow the heart images to be captured in real-time motion. The images will be recorded on a computer. The procedure may vary among health care providers and hospitals. What happens after the procedure?  You may return to your normal, everyday life, including diet, activities, and medicines, unless your health care provider tells you not to do that. Summary  An echocardiogram is a procedure that uses painless sound waves (ultrasound) to produce an image of the heart.  Images from an echocardiogram can provide important information about the size and shape  of your heart, heart muscle function, heart valve function, and fluid buildup around your heart.  You do not need to do anything to prepare before this procedure. You may eat and drink normally.  After the echocardiogram is completed, you may return to your  normal, everyday life, unless your health care provider tells you not to do that. This information is not intended to replace advice given to you by your health care provider. Make sure you discuss any questions you have with your health care provider. Document Released: 07/01/2000 Document Revised: 10/25/2018 Document Reviewed: 08/06/2016 Elsevier Patient Education  2020 [place].   Ambulatory Cardiac Monitoring An ambulatory cardiac monitor is a small recording device that is used to detect abnormal heart rhythms (arrhythmias). Most monitors are connected by wires to flat, sticky disks (electrodes) that are then attached to your chest. You may need to wear a monitor if you have had symptoms such as:  Fast heartbeats (palpitations).  Dizziness.  Fainting or light-headedness.  Unexplained weakness.  Shortness of breath. There are several types of monitors. Some common monitors include:  Holter monitor. This records your heart rhythm continuously, usually for 24-48 hours.  Event (episodic) monitor. This monitor has a symptoms button, and when pushed, it will begin recording. You need to activate this monitor to record when you have a heart-related symptom.  Automatic detection monitor. This monitor will begin recording when it detects an abnormal heartbeat. What are the risks? Generally, these devices are safe to use. However, it is possible that the skin under the electrodes will become irritated. How to prepare for monitoring Your health care provider will prepare your chest for the electrode placement and show you how to use the monitor.  Do not apply lotions to your chest before monitoring.  Follow directions on how to care for the monitor, and how to return the monitor when the testing period is complete. How to use your cardiac monitor  Follow directions about how long to wear the monitor, and if you can take the monitor off in order to shower or bathe. ? Do not let the  monitor get wet. ? Do not bathe, swim, or use a hot tub while wearing the monitor.  Keep your skin clean. Do not put body lotion or moisturizer on your chest.  Change the electrodes as told by your health care provider, or any time they stop sticking to your skin. You may need to use medical tape to keep them on.  Try to put the electrodes in slightly different places on your chest to help prevent skin irritation. Follow directions from your health care provider about where to place the electrodes.  Make sure the monitor is safely clipped to your clothing or in a location close to your body as recommended by your health care provider.  If your monitor has a symptoms button, press the button to mark an event as soon as you feel a heart-related symptom, such as: ? Dizziness. ? Weakness. ? Light-headedness. ? Palpitations. ? Thumping or pounding in your chest. ? Shortness of breath. ? Unexplained weakness.  Keep a diary of your activities, such as walking, doing chores, and taking medicine. It is very important to note what you were doing when you pushed the button to record your symptoms. This will help your health care provider determine what might be contributing to your symptoms.  Send the recorded information as recommended by your health care provider. It may take some time for your health care  provider to process the results.  Change the batteries as told by your health care provider.  Keep electronic devices away from your monitor. These include: ? Tablets. ? MP3 players. ? Cell phones.  While wearing your monitor you should avoid: ? Electric blankets. ? Armed forces operational officer. ? Electric toothbrushes. ? Microwave ovens. ? Magnets. ? Metal detectors. Get help right away if:  You have chest pain.  You have shortness of breath or extreme difficulty breathing.  You develop a very fast heartbeat that does not get better.  You develop dizziness that does not go away.  You  faint or constantly feel like you are about to faint. Summary  An ambulatory cardiac monitor is a small recording device that is used to detect abnormal heart rhythms (arrhythmias).  Make sure you understand how to send the information from the monitor to your health care provider.  It is important to press the button on the monitor when you have any heart-related symptoms.  Keep a diary of your activities, such as walking, doing chores, and taking medicine. It is very important to note what you were doing when you pushed the button to record your symptoms. This will help your health care provider learn what might be causing your symptoms. This information is not intended to replace advice given to you by your health care provider. Make sure you discuss any questions you have with your health care provider. Document Released: 04/12/2008 Document Revised: 06/16/2017 Document Reviewed: 06/18/2016 Elsevier Patient Education  2020 [employer].

## 2019-05-22 ENCOUNTER — Telehealth: Payer: Self-pay | Admitting: *Deleted

## 2019-05-22 ENCOUNTER — Other Ambulatory Visit: Payer: Self-pay

## 2019-05-22 ENCOUNTER — Ambulatory Visit (HOSPITAL_COMMUNITY): Payer: Self-pay | Attending: Cardiology

## 2019-05-22 ENCOUNTER — Telehealth: Payer: Self-pay | Admitting: Cardiology

## 2019-05-22 DIAGNOSIS — R55 Syncope and collapse: Secondary | ICD-10-CM

## 2019-05-22 MED ORDER — PERFLUTREN LIPID MICROSPHERE
1.0000 mL | INTRAVENOUS | Status: AC | PRN
  Administered 2019-05-22: 2 mL via INTRAVENOUS

## 2019-05-22 NOTE — Telephone Encounter (Signed)
Some information regarding monitor self pay/application for financial assistance,  was relayed to patient when he checked in for his Echocardiogram today at 2:00PM.

## 2019-05-22 NOTE — Telephone Encounter (Signed)
Calling to let patient know a 14 day ZIO XT long term holter monitor will be mailed to his home. Instructions included in the monitor kit.  He should receive the kit in 3-5 days USPS Priority mail.  Patient is self pay.  To apply for assistance call [employer] at [phone], select option 1, select option 4, and ask to apply for assistance.  [employer] will also set up a 12 month payment plan.  No answer/ No DPR

## 2019-05-22 NOTE — Telephone Encounter (Signed)
Called back to speak with patient regarding results of echo.  Pt was not available however was able to review results with patient's mother as requested.  All questions were answered at the time of the call.  Mother is concerned about pt's BP but she will have pt f/u with his PCP regarding treatment of this if necessary.

## 2019-05-22 NOTE — Telephone Encounter (Signed)
Patient returning call for Echo results.  He stated if he is not home, results can be given to his mother.

## 2019-05-24 ENCOUNTER — Telehealth: Payer: Self-pay

## 2019-05-24 NOTE — Telephone Encounter (Addendum)
[employer]sson was not home so I notified his mom as instructed by telephone.  She states Dr. Kingsley Plan office called her and deferred back to Dr. Lorelei Pont about BP medication.  See phone note on 05/22/2019 from Dr. Kingsley Plan office. Please advise.

## 2019-05-24 NOTE — Telephone Encounter (Signed)
Please call  I don't want him to do anything. He should be seeing Dr. Marlou Porch in follow-up, and I would entirely defer to him.  Your blood pressures have been very erratic.

## 2019-05-24 NOTE — Telephone Encounter (Signed)
Patient called and stated that he had an electrocardiogram on Wednesday, and the provider suggested that Dr. Lorelei Pont restart him on his blood pressure medications. Patient states that he has Amlodipine 10mg , Losartan 100mg , and Chlorthalidone 50mg . Patient states that he wanted to speak with Dr. Lorelei Pont and see what medications he suggests - as he is also taking Zoloft and he wants to make sure there will not be an interaction between the two medications. Dr. Lorelei Pont, is there anything you can suggest or would you like me to get him scheduled for an office visit to discuss this?

## 2019-05-26 NOTE — Telephone Encounter (Signed)
Use blood pressure cuff twice a day when first waking up and then in the afternoon. If he doesn't have one, get a home BP cuff. Can get from [facility], [employer], etc. Write them all down.  F/u doxy visit in 2 week. Have him please have them available during his OV.

## 2019-05-27 NOTE — Telephone Encounter (Signed)
[name]'s mom notified as instructed by telephone. They do have a BP cuff to use at home.  She will let [name] know about checking his BP twice a day and record the.  She will have [name] call back to schedule his 2 week virtual appointment with Dr. Lorelei Pont.

## 2019-06-19 NOTE — Telephone Encounter (Addendum)
Pt said BP is elevated again; pt did not schedule virtual visit from 05/27/19 note. Pt said today at 12 noon BP 167/134; usually last few wks diastolic readings are [id number]. Pt took BP now and BP 152/98.pt is not taking any BP med at this time. Pt is taking OTC garlic. Pt is not having any H/A,dizziness,CP or unusual SOB. Pt has no covid symptoms, no travel and no known exposure to + covid.  Pt said over the past 3 wks when pt was dizzy; pt ears would pop like he was in the mountains and then fluid would pour out of both ears and get his shirt wet.The dizziness would then go away. Last time this happened was today and happened 2 other times in last 3 wks. UC & ED precautions given to pt and pt voiced understanding. Pt scheduled 30' in office appt with Dr Damita Dunnings 06/20/19 due to Dr Lorelei Pont being out of office on 06/20/19. Pt felt like he needed to be seen tomorrow. Pt will bring BP log of readings.

## 2019-06-19 NOTE — Telephone Encounter (Signed)
Noted. Thanks.  Routed to PCP as FYI.  

## 2019-06-20 ENCOUNTER — Encounter: Payer: Self-pay | Admitting: Family Medicine

## 2019-06-20 ENCOUNTER — Other Ambulatory Visit: Payer: Self-pay

## 2019-06-20 ENCOUNTER — Ambulatory Visit (INDEPENDENT_AMBULATORY_CARE_PROVIDER_SITE_OTHER): Payer: Self-pay | Admitting: Family Medicine

## 2019-06-20 DIAGNOSIS — I10 Essential (primary) hypertension: Secondary | ICD-10-CM

## 2019-06-20 DIAGNOSIS — H6980 Other specified disorders of Eustachian tube, unspecified ear: Secondary | ICD-10-CM

## 2019-06-20 MED ORDER — FLUTICASONE PROPIONATE 50 MCG/ACT NA SUSP: 2.0000 | Freq: Every day | NASAL | 1 refills | Status: DC

## 2019-06-20 MED ORDER — AMLODIPINE BESYLATE 10 MG PO TABS: 5.0000 mg | ORAL_TABLET | Freq: Every day | ORAL | Status: DC

## 2019-06-20 NOTE — Progress Notes (Signed)
This visit occurred during the SARS-CoV-2 public health emergency.  Safety protocols were in place, including screening questions prior to the visit, additional usage of staff PPE, and extensive cleaning of exam room while observing appropriate contact time as indicated for disinfecting solutions.   Hypertension:    Not currently on meds for HTN but had mult readings with BP elevation.  Chest pain with exertion:no Edema:no Short of breath:no  Cymbalta seems to help his mood.   He has seen Dr. Gillian Shields with cardiology in the past.    He had episidic vertigo, then his ears "popped" and vertigo resolved but he had fluid draining from B ears.  Then he had return of vertigo yesterday until L ear drained. It got his shirt wet.   He didn't recall ear pain.  He reports a high pain tolerance.   Meds, vitals, and allergies reviewed.  ROS: Per HPI unless specifically indicated in ROS section   GEN: nad, alert and oriented HEENT: mucous membranes moist, TM wnl B.  No tympanic membrane perforation seen.  No debris in the canal. NECK: supple w/o LA CV: rrr.  PULM: ctab, no inc wob ABD: soft, +bs EXT: no edema

## 2019-06-20 NOTE — Patient Instructions (Signed)
Start using flonase and see if that helps with the ear pressure.   Restart amlodipine 5mg  a day (1/2 of the 10mg  tabs).   If lightheaded, then stop it an update [place].  Otherwise update [place] about your BP in about 1 week. Take care.  Glad to see you.

## 2019-06-23 DIAGNOSIS — H6980 Other specified disorders of Eustachian tube, unspecified ear: Secondary | ICD-10-CM | POA: Insufficient documentation

## 2019-06-23 NOTE — Assessment & Plan Note (Signed)
It sounds like he has had previous tympanic membrane rupture that drained clear fluid.  I do not see any residual perforation in either TM now.  Discussed eustachian tube dysfunction/anatomy.  Reasonable to restart Flonase and use that and see if he improves.  No sign of acute otitis at this point.  Okay for outpatient follow-up.  He agrees.

## 2019-06-23 NOTE — Assessment & Plan Note (Signed)
Discussed options.  Still okay for outpatient follow-up. Restart amlodipine 5mg  a day (1/2 of the 10mg  tabs).  If lightheaded, then stop it an update [place].  Otherwise he will update [place] about his BP in about 1 week.

## 2019-06-28 ENCOUNTER — Telehealth: Payer: Self-pay

## 2019-06-28 MED ORDER — AMLODIPINE BESYLATE 10 MG PO TABS: 10.0000 mg | ORAL_TABLET | Freq: Every day | ORAL | 0 refills | Status: DC

## 2019-06-28 NOTE — Telephone Encounter (Signed)
Routed to PCP as FYI.  Glad flonase helped.  Would still inc amlodipine to 10mg  a day.  rx sent.  Have him update [place] about BP in about 7 days, sooner if needed.  If lightheaded, then cut back to 5mg  a day.  Thanks.

## 2019-06-28 NOTE — Telephone Encounter (Signed)
Mother advised who phoned in the BP readings earlier today for the patient.

## 2019-06-28 NOTE — Telephone Encounter (Signed)
On 06/21/19 BP 163/95 did not get Heart rate 06/22/19 BP 160/91 06/23/19 BP 166/103 taken in evening 06/24/19 BP 156/87 06/25/19 BP 156/103 taken in evening 06/26/19 BP 182/97 taken in afternoon 06/27/19 BP 149/85 06/28/19 BP 151/90  Pt had left readings with pts mom to give to me. When pt was seen on 06/20/19 and pts BP monitor was 17 points higher than what was taken at 06/20/19 office visit. Pt is taking amlodipine 5 mg daily. Pt has not missed any pills. [facility] [address]. No H/A,dizziness, SOB or CP per pts mom. Flonase has helped the ear pressure; no drainage of fluid from the ears. Request cb after Dr Damita Dunnings reviews.

## 2019-11-04 ENCOUNTER — Telehealth (INDEPENDENT_AMBULATORY_CARE_PROVIDER_SITE_OTHER): Payer: Self-pay | Admitting: Family Medicine

## 2019-11-04 ENCOUNTER — Encounter: Payer: Self-pay | Admitting: Family Medicine

## 2019-11-04 ENCOUNTER — Other Ambulatory Visit: Payer: Self-pay

## 2019-11-04 VITALS — BP 152/87 | HR 87

## 2019-11-04 DIAGNOSIS — R05 Cough: Secondary | ICD-10-CM

## 2019-11-04 DIAGNOSIS — R059 Cough, unspecified: Secondary | ICD-10-CM

## 2019-11-04 DIAGNOSIS — R0981 Nasal congestion: Secondary | ICD-10-CM

## 2019-11-04 DIAGNOSIS — F17200 Nicotine dependence, unspecified, uncomplicated: Secondary | ICD-10-CM

## 2019-11-04 MED ORDER — DOXYCYCLINE HYCLATE 100 MG PO TABS: 100.0000 mg | ORAL_TABLET | Freq: Two times a day (BID) | ORAL | 0 refills | Status: AC

## 2019-11-04 NOTE — Progress Notes (Signed)
     Aubryana Vittorio T. Maki Sweetser, MD [facility] [place], [address] Phone: [phone]  FAX: [phone]  [name] - 54 y.o. male  MRN [id number]  Date of Birth: December 19, 1965  Visit Date: 11/04/2019  PCP: Owens Loffler, MD  Referred by: Owens Loffler, MD Chief Complaint  Patient presents with  . Nasal Congestion  . Cough   Virtual Visit via Video Note:  I connected with  [name] on 11/04/2019  2:00 PM EDT by a video enabled telemedicine application and verified that I am speaking with the correct person using two identifiers.   Location patient: home computer, tablet, or smartphone Location provider: work or home office Consent: Verbal consent directly obtained from [name]. Persons participating in the virtual visit: patient, provider  I discussed the limitations of evaluation and management by telemedicine and the availability of in person appointments. The patient expressed understanding and agreed to proceed.  History of Present Illness:  [name] has had some significant cough productive of some yellowish sputum, nasal congestion.  Quite a bit of stuffiness is face.  He has not had any fever.  He is eating and drinking okay.  He has not been able to smoke due to the congestion in his chest and he had pain when he did so.  Overall, he does not feel well and is fatigued as well.  Review of Systems as above: See pertinent positives and pertinent negatives per HPI No acute distress verbally   Observations/Objective/Exam:  An attempt was made to discern vital signs over the phone and per patient if applicable and possible.   General:    Alert, Oriented, appears well and in no acute distress  Pulmonary:     On inspection no signs of respiratory distress.  Psych / Neurological:     Pleasant and cooperative.  Assessment and Plan:    ICD-10-CM   1. Cough  R05   2. Nasal  congestion  R09.81   3. Smoker  F17.200    Longer-lasting cough and a 54 year old smoker.  I advised him to get a COVID-19 test.  With risk factors, I am getting give him some doxycycline to take, but if he does have a positive Covid test, that he does not need to continue these.  I discussed the assessment and treatment plan with the patient. The patient was provided an opportunity to ask questions and all were answered. The patient agreed with the plan and demonstrated an understanding of the instructions.   The patient was advised to call back or seek an in-person evaluation if the symptoms worsen or if the condition fails to improve as anticipated.  Follow-up: prn unless noted otherwise below No follow-ups on file.  Meds ordered this encounter  Medications  . doxycycline (VIBRA-TABS) 100 MG tablet    Sig: Take 1 tablet (100 mg total) by mouth 2 (two) times daily for 10 days.    Dispense:  20 tablet    Refill:  0   No orders of the defined types were placed in this encounter.   Signed,  Maud Deed. Ebonie Westerlund, MD

## 2019-11-05 ENCOUNTER — Encounter: Payer: Self-pay | Admitting: Family Medicine

## 2019-11-19 ENCOUNTER — Telehealth: Payer: Self-pay

## 2019-11-19 DIAGNOSIS — R42 Dizziness and giddiness: Secondary | ICD-10-CM

## 2019-11-19 NOTE — Telephone Encounter (Addendum)
Spoke with [name].  He states his dizziness is back and he would like a referral to Dr. Lucia Gaskins, ENT to see if there is inner ear issues.  He called their office and tried to schedule an appointment himself but they told him he needed a referral.  Office phone number [phone].

## 2019-11-19 NOTE — Telephone Encounter (Signed)
Patient contacted the office. He states he would like to speak with Butch Penny. I advised she was with patient, and I asked if there was anything I could help him with - and he states no, he just needs to speak with Butch Penny. I will route to her for further follow up.  Thanks!

## 2019-11-20 NOTE — Addendum Note (Signed)
Addended by: Owens Loffler on: 11/20/2019 01:20 PM   Modules accepted: Orders

## 2020-01-18 ENCOUNTER — Other Ambulatory Visit: Payer: Self-pay | Admitting: Family Medicine

## 2020-01-18 ENCOUNTER — Other Ambulatory Visit: Payer: Self-pay

## 2020-01-18 ENCOUNTER — Encounter (HOSPITAL_COMMUNITY): Payer: Self-pay | Admitting: Emergency Medicine

## 2020-01-18 ENCOUNTER — Emergency Department (HOSPITAL_COMMUNITY)
Admission: EM | Admit: 2020-01-18 | Discharge: 2020-01-18 | Disposition: A | Discharge status: Home / Self Care | Payer: Self-pay | Attending: Emergency Medicine | Admitting: Emergency Medicine

## 2020-01-18 DIAGNOSIS — T7840XA Allergy, unspecified, initial encounter: Secondary | ICD-10-CM | POA: Insufficient documentation

## 2020-01-18 DIAGNOSIS — Z79899 Other long term (current) drug therapy: Secondary | ICD-10-CM | POA: Insufficient documentation

## 2020-01-18 DIAGNOSIS — Z91040 Latex allergy status: Secondary | ICD-10-CM | POA: Insufficient documentation

## 2020-01-18 DIAGNOSIS — F17210 Nicotine dependence, cigarettes, uncomplicated: Secondary | ICD-10-CM | POA: Insufficient documentation

## 2020-01-18 DIAGNOSIS — I10 Essential (primary) hypertension: Secondary | ICD-10-CM | POA: Insufficient documentation

## 2020-01-18 MED ORDER — DIPHENHYDRAMINE HCL 25 MG PO TABS: 25.0000 mg | ORAL_TABLET | Freq: Four times a day (QID) | ORAL | 0 refills | Status: DC | PRN

## 2020-01-18 MED ORDER — EPINEPHRINE 0.3 MG/0.3ML IJ SOAJ: 0.3000 mg | INTRAMUSCULAR | 1 refills | Status: DC | PRN

## 2020-01-18 MED ORDER — PREDNISONE 20 MG PO TABS: 20.0000 mg | ORAL_TABLET | Freq: Every day | ORAL | 0 refills | Status: AC

## 2020-01-18 MED ORDER — FAMOTIDINE 20 MG PO TABS: 20.0000 mg | ORAL_TABLET | Freq: Two times a day (BID) | ORAL | 0 refills | Status: DC

## 2020-01-18 NOTE — ED Notes (Signed)
Pt asleep, equal chest rise.

## 2020-01-18 NOTE — ED Triage Notes (Signed)
Pt BIBA from urgent care-   Per EMS- Pt reports being stung by bee yesterday, used epi-pen.  Stung again today, used old (expired?) epi-pen today.   Drove 45 min to urgent care today,   urgent care gave 100 Kenalog IM; 50 mg benadryl PO, 40 mg pepcid PO, 0.5 epi IM given at urgent care.   Pt arrives to ED on room air; speaking in full sentences.   18 g L AC
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
## 2020-01-18 NOTE — Discharge Instructions (Signed)
  Take the steroids as directed. You can also take Pepcid and Benadryl as well. If you need to use your EpiPen, you will need to call 911 immediately afterwards. Return to the ER for any lip or tongue swelling, shortness of breath, additional bee stings causing a similar reaction.

## 2020-01-21 ENCOUNTER — Telehealth: Payer: Self-pay

## 2020-01-21 NOTE — Telephone Encounter (Signed)
[facility] - Client TELEPHONE ADVICE RECORD [facility] Patient Name: [name] Gender: Male DOB: 21-Mar-1966 Age: 54 Y 2 M Return Phone Number: [phone] (Primary) Address: City/State/Zip: [place] [address] Client [facility] - Client Client Site [facility] Physician Owens Loffler - MD Contact Type Call Who Is Calling Patient / Member / Family / Caregiver Call Type Triage / Clinical Relationship To Patient Self Return Phone Number [phone] (Primary) Chief Complaint ALLERGIC REACTION-EPI PEN USED Reason for Call Medication Question / Request Initial Comment Caller states he uses Eip-pen for bee stings. He used both of his yesterday and today. His pharmacy will not give him ER supply. Translation No Nurse Assessment Nurse: [place], RN, Estill Bamberg Date/Time (Eastern Time): 01/18/2020 1:36:04 PM Confirm and document reason for call. If symptomatic, describe symptoms. ---Caller states he uses an epi pen for bee stings - used one yesterday and today and now out and the pharmacy will not give him a refill. Not currently having symptoms. Has the patient had close contact with a person known or suspected to have the novel coronavirus illness OR traveled / lives in area with major community spread (including international travel) in the last 14 days from the onset of symptoms? * If Asymptomatic, screen for exposure and travel within the last 14 days. ---No Does the patient have any new or worsening symptoms? ---Yes Will a triage be completed? ---Yes Related visit to physician within the last 2 weeks? ---No Does the PT have any chronic conditions? (i.e. diabetes, asthma, this includes High risk factors for pregnancy, etc.) ---Yes List chronic conditions. ---allergy to bee stings Is this a behavioral health or substance abuse call? ---No Guidelines Guideline Title Affirmed Question Affirmed  Notes Nurse Date/Time (Eastern Time) Anaphylaxis [1] Had epinephrine shot AND [2] no symptoms now [place], Cytogeneticist 01/18/2020 1:38:46 PM Disp. Time Eilene Ghazi Time) Disposition Final User 01/18/2020 1:34:41 PM Send to Urgent Queue Tabaczynski, PaulinePLEASE NOTE: All timestamps contained within this report are represented as [place] Standard Time. CONFIDENTIALTY NOTICE: This fax transmission is intended only for the addressee. It contains information that is legally privileged, confidential or otherwise protected from use or disclosure. If you are not the intended recipient, you are strictly prohibited from reviewing, disclosing, copying using or disseminating any of this information or taking any action in reliance on or regarding this information. If you have received this fax in error, please notify us immediately by telephone so that we can arrange for its return to [place]. Phone: [phone], Toll-Free: [phone], Fax: [phone] Page: 2 of 2 Call Id: [id number] 01/18/2020 1:41:00 PM Go to ED Now Yes York, RN, Shelly Coss Disagree/Comply Disagree Caller Understands Yes PreDisposition Call Doctor Care Advice Given Per Guideline GO TO ED NOW: * You need to be seen in the Emergency Department. * Go to the ED at ___________ [place] now. Drive carefully. TAKE AN ANTIHISTAMINE (E.G., BENADRYL) NOW: * If you have it readily available, take a dose of Benadryl (OTC diphenhydramine, 50 mg by mouth). ANOTHER ADULT SHOULD DRIVE: * It is better and safer if another adult drives instead of you. CALL 911 IF: * Difficulty breathing or swallowing occurs * You become worse. CARE ADVICE given per Anaphylaxis (Adult) guideline. Comments User: Darnelle Catalan, RN Date/Time Eilene Ghazi Time): 01/18/2020 1:38:26 PM allergic to seafood, latex, PCN User: Darnelle Catalan, RN Date/Time Eilene Ghazi Time): 01/18/2020 1:48:35 PM caller advised to go to ED for post epipen usage  follow up declined calling 911 for symptoms or  going to ED. doesn't have insurance but has a epi pen Rx at his normal pharmacy. advised to try to tranfer Rx to other pharmacy, call back during business hours, pick up his rx at [facility], or go to ED/call 911. Referrals [facility] - ED

## 2020-01-24 ENCOUNTER — Other Ambulatory Visit: Payer: Self-pay

## 2020-01-24 MED ORDER — EPINEPHRINE 0.3 MG/0.3ML IJ SOAJ: 0.3000 mg | INTRAMUSCULAR | 0 refills | Status: DC | PRN

## 2020-01-24 NOTE — Telephone Encounter (Signed)
Patient's mother called back   She stated she needs these sent to the [address]- [employer]

## 2020-01-24 NOTE — Telephone Encounter (Signed)
Script sent to [facility]. 

## 2020-01-24 NOTE — Telephone Encounter (Signed)
Patient's mother contacted the office and states that patient is allergic to bee's and has an epi pen for when he gets stung. She states last Sunday he was stung 3 times and he used up his last epi-pen. He was transported the the ER, and the prescribed an additional 2 epi-pens when he was discharged. She states he was stung again yesterday and used 1 of his epi-pens, so now he only has one left. She is wondering if Dr. Lorelei Pont will send in a refill for 3 epi-pens, just so he has them on hand, as they do not want to get into a situation where he is stung again, but does not have an epi pen. Please advise.

## 2020-01-24 NOTE — Addendum Note (Signed)
Addended by: Randall An on: 01/24/2020 04:51 PM   Modules accepted: Orders

## 2020-01-31 ENCOUNTER — Other Ambulatory Visit: Payer: Self-pay | Admitting: Family Medicine

## 2020-01-31 ENCOUNTER — Other Ambulatory Visit: Payer: Self-pay | Admitting: *Deleted

## 2020-01-31 ENCOUNTER — Telehealth: Payer: Self-pay

## 2020-01-31 MED ORDER — EPINEPHRINE 0.3 MG/0.3ML IJ SOAJ: 0.3000 mg | INTRAMUSCULAR | 1 refills | Status: DC | PRN

## 2020-01-31 NOTE — Telephone Encounter (Signed)
Pt contacted Access Nurse with c/o diarrhea x3 days. Disposition was to go to ER and pt requested OV. Access nurse contacted office. This nurse talked to pt. Pt c/o diarrhea and intermittent severe abdominal cramping for 3 days. No nausea or vomiting but reports 30 minutes after eating he has diarrhea. Advised pt he is most likely dehydrated and needs to go to the ER to be assessed. Pt agreed to go to the ER. Pt said his mother would bring him this morning and will f/u with clinic next week. Advised if any problems getting to ER the pt would contact clinic. Pt verbalized understanding.

## 2020-01-31 NOTE — Telephone Encounter (Signed)
Will forward to Dr. Lorelei Pont to approve refill since patient is using these Epi-Pens so frequently.  Ok to send in more refills.  Last refilled 01/24/2020 for 3 pens.  See note from patient's mom below.

## 2020-01-31 NOTE — Telephone Encounter (Signed)
[place] from [facility] left a voicemail stating that they had received a denial for a refill on patient's Epi Pen. [place] stated that the patient's mom called stating that he has been stung 7 times in the last couple of weeks. Patient's mom stated that he is down to his last Epi-pen.

## 2020-01-31 NOTE — Telephone Encounter (Signed)
Patient's mother called in again stating they are needing more refills of epi-pens, as they have one left, as patient has been stung 7 times that mother can remember. Patient's mother stated patient was stung before the 4th of July, Saturday after 4th they used their neighbors as they were out, got a refill, Monday following was stung again, Wednesday stung again, and mother could not remember the other days he was stung. Mother, Tessie Fass is wanting a refill sent in ASAP as patient has one left and they do not want to be without it. Please advise.

## 2020-01-31 NOTE — Telephone Encounter (Signed)
[facility] - Client TELEPHONE ADVICE RECORD [facility] Patient Name: [name] Gender: Male DOB: [birth year] Age: 54 Y 2 M 13 D Return Phone Number: [phone] (Primary) Address: City/State/Zip: [place] [address] Client [facility] - Client Client Site [place] - Day Physician Copland, Frederico Hamman - MD Contact Type Call Who Is Calling Patient / Member / Family / Caregiver Call Type Triage / Clinical Caller Name Raylen Tangonan Relationship To Patient Mother Return Phone Number [phone] (Primary) Chief Complaint Abdominal Pain Reason for Call Symptomatic / Request for Health Information Initial Comment Has severe diarrhea and abdominal pain for the past 3 days. Translation No Nurse Assessment Nurse: Jimmye Norman, RN, Whitney Date/Time (Eastern Time): 01/31/2020 8:35:05 AM Confirm and document reason for call. If symptomatic, describe symptoms. ---Has severe diarrhea 10-12 per day and abdominal pain for the past 3 days. States he is allergic to bee stings and has had several over the last week, he has been seen in the ED already for them and used his epi-pen 3 times, most recently yesterday for 2 stings. Has the patient had close contact with a person known or suspected to have the novel coronavirus illness OR traveled / lives in area with major community spread (including international travel) in the last 14 days from the onset of symptoms? * If Asymptomatic, screen for exposure and travel within the last 14 days. ---No Does the patient have any new or worsening symptoms? ---Yes Will a triage be completed? ---Yes Related visit to physician within the last 2 weeks? ---No Does the PT have any chronic conditions? (i.e. diabetes, asthma, this includes High risk factors for pregnancy, etc.) ---Yes List chronic conditions. ---allergy to bees Is this a behavioral health or substance abuse call?  ---No Guidelines Guideline Title Affirmed Question Affirmed Notes Nurse Date/Time (Eastern Time) Bee or [place] Sting [1] Gave epinephrine shot AND [2] no symptoms now [place], Therapist, sports, Loree Fee 01/31/2020 8:38:13 AM Disp. Time (Eastern Time) Disposition Final UserPLEASE NOTE: All timestamps contained within this report are represented as [place] Standard Time. CONFIDENTIALTY NOTICE: This fax transmission is intended only for the addressee. It contains information that is legally privileged, confidential or otherwise protected from use or disclosure. If you are not the intended recipient, you are strictly prohibited from reviewing, disclosing, copying using or disseminating any of this information or taking any action in reliance on or regarding this information. If you have received this fax in error, please notify us immediately by telephone so that we can arrange for its return to [place]. Phone: [phone], Toll-Free: [phone], Fax: [phone] Page: 2 of 2 Call Id: [id number] 01/31/2020 8:42:10 AM Go to ED Now Yes Jimmye Norman, RN, Whitney Caller Disagree/Comply Disagree Caller Understands Yes PreDisposition InappropriateToAsk Care Advice Given Per Guideline GO TO ED NOW: * You need to be seen in the Emergency Department. Comments User: Myriam Forehand, RN Date/Time (Eastern Time): 01/31/2020 8:43:35 AM RN attempted to reach backline, no answer User: Myriam Forehand, RN Date/Time (Eastern Time): 01/31/2020 8:51:35 AM RN called backline and informed of caller symptoms, triage outcome, and refusal to go and request for appointment in office. User: Myriam Forehand, RN Date/Time Eilene Ghazi Time): 01/31/2020 8:53:07 AM RN warm transferred Janace Litten to [place], lead nurse in the office to discuss symptoms Referrals GO TO FACILITY REFUSED

## 2020-01-31 NOTE — Addendum Note (Signed)
Addended by: Carter Kitten on: 01/31/2020 10:41 AM   Modules accepted: Orders

## 2020-01-31 NOTE — Telephone Encounter (Signed)
I think the biggest concern based on this history and review would be severe abdominal pain.  Agree with disposition.

## 2020-01-31 NOTE — Telephone Encounter (Signed)
done

## 2020-02-10 ENCOUNTER — Ambulatory Visit: Payer: Self-pay | Admitting: Family Medicine

## 2020-02-10 ENCOUNTER — Encounter: Payer: Self-pay | Admitting: Family Medicine

## 2020-02-10 ENCOUNTER — Other Ambulatory Visit: Payer: Self-pay

## 2020-02-10 VITALS — BP 140/80 | HR 85 | Temp 97.8°F | Ht 69.75 in | Wt 233.5 lb

## 2020-02-10 DIAGNOSIS — G8929 Other chronic pain: Secondary | ICD-10-CM

## 2020-02-10 DIAGNOSIS — M2391 Unspecified internal derangement of right knee: Secondary | ICD-10-CM

## 2020-02-10 DIAGNOSIS — M25561 Pain in right knee: Secondary | ICD-10-CM

## 2020-02-10 DIAGNOSIS — M1711 Unilateral primary osteoarthritis, right knee: Secondary | ICD-10-CM

## 2020-02-10 MED ORDER — AMLODIPINE BESYLATE 10 MG PO TABS: 10.0000 mg | ORAL_TABLET | Freq: Every day | ORAL | 3 refills | Status: DC

## 2020-02-10 MED ORDER — METHYLPREDNISOLONE ACETATE 40 MG/ML IJ SUSP
80.0000 mg | Freq: Once | INTRAMUSCULAR | Status: AC
  Administered 2020-02-10: 80 mg via INTRA_ARTICULAR

## 2020-02-10 NOTE — Progress Notes (Addendum)
Patricio Popwell T. Liliahna Cudd, MD, [place]  [facility] and [place] at [facility] [place], [address]  Phone: [phone]  FAX: [phone]  BLAYZE HAEN - 54 y.o. male  MRN [id number]  Date of Birth: 01-13-1966  Date: 02/10/2020  PCP: Owens Loffler, MD  Referral: Owens Loffler, MD  Chief Complaint  Patient presents with  . Knee Pain    Right  . Depression    Wants to discuss changing back to Cymbalta    This visit occurred during the SARS-CoV-2 public health emergency.  Safety protocols were in place, including screening questions prior to the visit, additional usage of staff PPE, and extensive cleaning of exam room while observing appropriate contact time as indicated for disinfecting solutions.   Subjective:   [name] is a 54 y.o. very pleasant male patient with Body mass index is 33.74 kg/m. who presents with the following:  He is a patient at [facility]. He has had severe depression, recalcitrant in the past.  I was ultimately able to get him into [facility], and he did better.   R knee pain:   No known injury.  Started about a month ago.   L foot and ankle are hurting all the time.   He has a prior left-sided ankle fusion and had multiple severe traumas and this was ultimately handled with multiple surgeries by Dr. Marcelino Scot.  His depression is more stable after being managed by psychiatry.  Primarily has having some medial sided knee pain right now.  He does not have any kind specific injury that he can recall.  She is having some modest swelling.  No functional giving way or locking up.  Review of Systems is noted in the HPI, as appropriate   Objective:   BP (!) 140/80   Pulse 85   Temp 97.8 F (36.6 C) (Temporal)   Ht 5' 9.75" (1.772 m)   Wt (!) 233 lb 8 oz (105.9 kg)   SpO2 97%   BMI 33.74 kg/m    GEN: No acute distress; alert,appropriate. PULM: Breathing comfortably in no respiratory  distress PSYCH: Normally interactive.    Left knee: Full extension.  Flexion to 125.  Mild effusion.  Stable to varus and valgus stress and ACL and PCL are intact.  He does have some medial joint line tenderness and has some pain with McMurray's but no mechanical symptoms.  Radiology: DG Knee 4 Views W/Patella Right  Result Date: 02/14/2020 CLINICAL DATA:  Knee pain EXAM: RIGHT KNEE - COMPLETE 4+ VIEW COMPARISON:  None. FINDINGS: No acute displaced fracture or malalignment. Corticated calcification along the superolateral right patella presumably due to bipartite patella. Small knee effusion. Minimal patellofemoral degenerative change. IMPRESSION: 1. No acute osseous abnormality. 2. Small knee effusion with minimal patellofemoral degenerative change. 3. Bipartite patella. Electronically Signed   By: Donavan Foil M.D.   On: 02/14/2020 16:20     Assessment and Plan:     ICD-10-CM   1. Primary osteoarthritis of right knee  M17.11 DG Knee 4 Views W/Patella Right    methylPREDNISolone acetate (DEPO-MEDROL) injection 80 mg    MR Knee Right Wo Contrast  2. Chronic pain of right knee  M25.561 DG Knee 4 Views W/Patella Right   G89.29 MR Knee Right Wo Contrast  3. Internal derangement of right knee  M23.91 MR Knee Right Wo Contrast   He has failed multiple over-the-counter remedies including Aleve, ibuprofen, Tylenol, Voltaren gel,  and multiple other topical medications ligaments.  Aspiration/Injection Procedure Note THERON CUMBIE 03-02-1966 Date of procedure: 02/10/2020  Procedure: Large Joint Aspiration / Injection of Knee, R Indications: Pain  Procedure Details Patient verbally consented to procedure. Risks (including potential rare risk of infection), benefits, and alternatives explained. Sterilely prepped with Chloraprep. Ethyl cholride used for anesthesia. 8 cc Lidocaine 1% mixed with 2 mL Depo-Medrol 40 mg injected using the anteromedial approach without difficulty. No complications with  procedure and tolerated well. Patient had decreased pain post-injection. Medication: 2 mL of Depo-Medrol 40 mg, equaling Depo-Medrol 80 mg total   02/19/2020 Addendum: The patient is called into our office on more than one occasion and saw one of my partners 1 week ago.  He is doing poorly with some severe knee pain and acute on chronic status but with dramatically more knee pain recently.  He has failed multiple avenues of conservative care including NSAIDs, Tylenol, icing, mild pain medication, intra-articular steroid injections as well as acupuncture.  Obtain an MRI of the right knee to evaluate for internal derangement, insufficiency fracture, or other ligamentous disruption. Electronically Signed  By: Owens Loffler, MD On: 02/19/2020 5:35 PM    Follow-up: No follow-ups on file.  Meds ordered this encounter  Medications  . amLODipine (NORVASC) 10 MG tablet    Sig: Take 1 tablet (10 mg total) by mouth daily.    Dispense:  90 tablet    Refill:  3  . methylPREDNISolone acetate (DEPO-MEDROL) injection 80 mg   Medications Discontinued During This Encounter  Medication Reason  . famotidine (PEPCID) 20 MG tablet Completed Course  . fluticasone (FLONASE) 50 MCG/ACT nasal spray Duplicate  . amLODipine (NORVASC) 10 MG tablet Reorder   Orders Placed This Encounter  Procedures  . DG Knee 4 Views W/Patella Right  . MR Knee Right Wo Contrast    Signed,  Ina Scrivens T. Sandon Yoho, MD   Outpatient Encounter Medications as of 02/10/2020  Medication Sig  . amLODipine (NORVASC) 10 MG tablet Take 1 tablet (10 mg total) by mouth daily.  . diphenhydrAMINE (BENADRYL) 25 MG tablet Take 1 tablet (25 mg total) by mouth every 6 (six) hours as needed for up to 5 days.  Marland Kitchen EPINEPHrine (EPIPEN 2-PAK) 0.3 mg/0.3 mL IJ SOAJ injection Inject 0.3 mLs (0.3 mg total) into the muscle as needed for anaphylaxis.  . fluticasone (FLONASE) 50 MCG/ACT nasal spray Place 2 sprays into both nostrils daily as needed for allergies  or rhinitis.  Marland Kitchen sertraline (ZOLOFT) 100 MG tablet Take 100 mg by mouth daily.  . [DISCONTINUED] amLODipine (NORVASC) 10 MG tablet Take 1 tablet (10 mg total) by mouth daily.  . [DISCONTINUED] famotidine (PEPCID) 20 MG tablet Take 1 tablet (20 mg total) by mouth 2 (two) times daily for 5 days.  . [DISCONTINUED] fluticasone (FLONASE) 50 MCG/ACT nasal spray Place 2 sprays into both nostrils daily. (Patient taking differently: Place 2 sprays into both nostrils as needed for allergies. )  . [EXPIRED] methylPREDNISolone acetate (DEPO-MEDROL) injection 80 mg    No facility-administered encounter medications on file as of 02/10/2020.

## 2020-02-14 ENCOUNTER — Other Ambulatory Visit: Payer: Self-pay

## 2020-02-14 ENCOUNTER — Encounter: Payer: Self-pay | Admitting: Family Medicine

## 2020-02-14 ENCOUNTER — Ambulatory Visit (INDEPENDENT_AMBULATORY_CARE_PROVIDER_SITE_OTHER)
Admission: RE | Admit: 2020-02-14 | Discharge: 2020-02-14 | Disposition: A | Discharge status: Home / Self Care | Payer: Self-pay | Source: Ambulatory Visit | Attending: Family Medicine | Admitting: Family Medicine

## 2020-02-14 ENCOUNTER — Ambulatory Visit: Payer: Self-pay | Admitting: Family Medicine

## 2020-02-14 VITALS — BP 130/70 | HR 100 | Temp 98.3°F | Ht 69.75 in | Wt 230.0 lb

## 2020-02-14 DIAGNOSIS — M1711 Unilateral primary osteoarthritis, right knee: Secondary | ICD-10-CM

## 2020-02-14 DIAGNOSIS — G8929 Other chronic pain: Secondary | ICD-10-CM

## 2020-02-14 DIAGNOSIS — M25561 Pain in right knee: Secondary | ICD-10-CM

## 2020-02-14 MED ORDER — DICLOFENAC SODIUM 75 MG PO TBEC: 75.0000 mg | DELAYED_RELEASE_TABLET | Freq: Two times a day (BID) | ORAL | 0 refills | Status: DC

## 2020-02-14 MED ORDER — TRAMADOL HCL 50 MG PO TABS: 50.0000 mg | ORAL_TABLET | Freq: Three times a day (TID) | ORAL | 0 refills | Status: AC | PRN

## 2020-02-14 NOTE — Patient Instructions (Addendum)
We will call with results of X-ray.  Start diclofenac twice daily for pain. Can use tramadol for breakthrough pain. Wear pull on knee brace and ice several times daily .

## 2020-02-14 NOTE — Assessment & Plan Note (Signed)
No clear sign of injection related infection.  Pain hsa not improved with steroid injection. Eval with X-ray.. may need MRI if not improving.  Start diclofenac twice daily and tramadol for breakthorugh pain. Brace and ice. Follow up next week with D. C if not improving.

## 2020-02-14 NOTE — Progress Notes (Signed)
Chief Complaint  Patient presents with  . Knee Pain    Right-Seen by Dr. Lorelei Pont 02/10/2020    History of Present Illness: HPI    54 year old male with right medial knee pain.  Last visit with PCP/Sports Med for knee pain on 02/10/2020  Dx  Chronic pain right knee due to OA.. pain ongoing for 1 month.  Givens steroid injection depomedrol 8/10 on pain scale  Xray not done. No associated new knee injury/fall He has failed multiple over-the-counter remedies including Aleve, ibuprofen, Tylenol, Voltaren gel, and multiple other topical medications ligaments and acupuncture.  Today he reports in last week pain in left knee has increased.. now pain all over knee. Cannot bend the whole way. No redness , no heat. No fever.   No contraindication to NSAIDs  No red flags prn pain.  He has a prior left-sided ankle fusion and had multiple severe traumas and this was ultimately handled with multiple surgeries by Dr. Marcelino Scot.  This visit occurred during the SARS-CoV-2 public health emergency.  Safety protocols were in place, including screening questions prior to the visit, additional usage of staff PPE, and extensive cleaning of exam room while observing appropriate contact time as indicated for disinfecting solutions.   COVID 19 screen:  No recent travel or known exposure to COVID19 The patient denies respiratory symptoms of COVID 19 at this time. The importance of social distancing was discussed today.     Review of Systems  Constitutional: Negative for chills and fever.  HENT: Negative for congestion and ear pain.   Eyes: Negative for pain and redness.  Respiratory: Negative for cough and shortness of breath.   Cardiovascular: Negative for chest pain, palpitations and leg swelling.  Gastrointestinal: Negative for abdominal pain, blood in stool, constipation, diarrhea, nausea and vomiting.  Genitourinary: Negative for dysuria.  Musculoskeletal: Positive for joint pain. Negative for  falls and myalgias.  Skin: Negative for rash.  Neurological: Negative for dizziness.  Psychiatric/Behavioral: Negative for depression. The patient is not nervous/anxious.       Past Medical History:  Diagnosis Date  . Acute kidney failure ([place])    after last surgery  . Alcohol abuse, in remission 11/03/2015  . Arthritis    "'about qwhere" (02/25/2016)  . Chronic lower back pain   . Cocaine abuse in remission ([place]) 11/03/2015  . Depression    takes Prozac daily  . GERD (gastroesophageal reflux disease)    occasionally and will take Rolaid if needed  . Hemorrhoids   . History of kidney stones   . History of MRSA infection    several yrs ago  . Hypertension    takes Lisinopril-HCTZ and AMlodipine daily  . OSA on CPAP    study was done 3 yrs ago.  Wears a cpap  . Polysubstance dependence, non-opioid, in remission ([place])   . RSD (reflex sympathetic dystrophy), L foot due to calcaneus fx 08/14/2012    reports that he has been smoking cigarettes. He has a 18.00 pack-year smoking history. He has quit using smokeless tobacco.  His smokeless tobacco use included chew and snuff. He reports previous alcohol use. He reports previous drug use. Drugs: Cocaine and Marijuana.   Current Outpatient Medications:  .  amLODipine (NORVASC) 10 MG tablet, Take 1 tablet (10 mg total) by mouth daily., Disp: 90 tablet, Rfl: 3 .  diphenhydrAMINE (BENADRYL) 25 MG tablet, Take 1 tablet (25 mg total) by mouth every 6 (six) hours as needed for up to 5 days., Disp:  20 tablet, Rfl: 0 .  EPINEPHrine (EPIPEN 2-PAK) 0.3 mg/0.3 mL IJ SOAJ injection, Inject 0.3 mLs (0.3 mg total) into the muscle as needed for anaphylaxis., Disp: 2 each, Rfl: 1 .  fluticasone (FLONASE) 50 MCG/ACT nasal spray, Place 2 sprays into both nostrils daily as needed for allergies or rhinitis., Disp: , Rfl:  .  sertraline (ZOLOFT) 100 MG tablet, Take 100 mg by mouth daily., Disp: , Rfl:    Observations/Objective: Blood pressure (!) 130/70, pulse  100, temperature 98.3 F (36.8 C), temperature source Temporal, height 5' 9.75" (1.772 m), weight (!) 230 lb (104.3 kg), SpO2 97 %.  Physical Exam Constitutional:      Appearance: He is well-developed.  HENT:     Head: Normocephalic.     Right Ear: Hearing normal.     Left Ear: Hearing normal.     Nose: Nose normal.  Neck:     Thyroid: No thyroid mass or thyromegaly.     Vascular: No carotid bruit.     Trachea: Trachea normal.  Cardiovascular:     Rate and Rhythm: Normal rate and regular rhythm.     Pulses: Normal pulses.     Heart sounds: Heart sounds not distant. No murmur heard.  No friction rub. No gallop.      Comments: No peripheral edema Pulmonary:     Effort: Pulmonary effort is normal. No respiratory distress.     Breath sounds: Normal breath sounds.  Musculoskeletal:     Right knee: No swelling, deformity, effusion or erythema. Decreased range of motion. Tenderness present over the medial joint line. No lateral joint line, MCL, LCL, ACL or PCL tenderness. Abnormal meniscus.     Instability Tests: Medial McMurray test positive.     Left knee: Normal.       Legs:     Comments: Pain mainly above joint line medially  Skin:    General: Skin is warm and dry.     Findings: No rash.  Psychiatric:        Speech: Speech normal.        Behavior: Behavior normal.        Thought Content: Thought content normal.      Assessment and Plan   Acute pain of right knee No clear sign of injection related infection.  Pain hsa not improved with steroid injection. Eval with X-ray.. may need MRI if not improving.  Start diclofenac twice daily and tramadol for breakthorugh pain. Brace and ice. Follow up next week with D. C if not improving.     Eliezer Lofts, MD

## 2020-02-17 ENCOUNTER — Telehealth: Payer: Self-pay

## 2020-02-17 NOTE — Telephone Encounter (Signed)
Patient contacted the office and states that he has a broken knee cap, and he would like to speak with Butch Penny or Dr. Lorelei Pont regarding what his next steps should be? I did advise that Dr. Lorelei Pont is out on vaction for awhile, but I would send this note back to [place].

## 2020-02-18 NOTE — Telephone Encounter (Signed)
Please call.  I tried last night, but I could only speak to his mom  I think there is confusion about his knee and his x-ray.  He has a "bipartite patella."  This is an anatomical variant.  In no way does he have a knee cap fracture.  I reviewed his x-rays myself, and radiology also does not think there is a fracture.  "no osseous abnormaility" and "bipartite patella" - this means no fracture.  We could give it more time.  If he is in considerate pain and would consider surgery, then I can get an MRI of his knee.  Electronically Signed  By: Owens Loffler, MD On: 02/18/2020 9:15 AM   CLINICAL DATA:  Knee pain   EXAM: RIGHT KNEE - COMPLETE 4+ VIEW   COMPARISON:  None.   FINDINGS: No acute displaced fracture or malalignment. Corticated calcification along the superolateral right patella presumably due to bipartite patella. Small knee effusion. Minimal patellofemoral degenerative change.   IMPRESSION: 1. No acute osseous abnormality. 2. Small knee effusion with minimal patellofemoral degenerative change. 3. Bipartite patella.     Electronically Signed   By: Donavan Foil M.D.   On: 02/14/2020 16:20

## 2020-02-18 NOTE — Telephone Encounter (Signed)
[name] notified as instructed by telephone.  He would like to move forward with the MRI. He states the pain medication that Dr. Diona Browner is not helping.

## 2020-02-19 NOTE — Telephone Encounter (Signed)
See my addendum from the patient's office encounter. MRI ordered

## 2020-02-19 NOTE — Addendum Note (Signed)
Addended by: Owens Loffler on: 02/19/2020 05:35 PM   Modules accepted: Orders

## 2020-02-25 ENCOUNTER — Telehealth: Payer: Self-pay | Admitting: Family Medicine

## 2020-02-25 NOTE — Telephone Encounter (Signed)
Pt called stating he saw dr Diona Browner a couple weeks ago regarding his knee.  She gave him a rx for tramadol 50mg   And diclofenac 75mg .  He is stated that neither one of these are working with the pain any more.    Pt stated if he takes one of either one of the pills and 6 to 8 ibuprofen he can get some relief.  Is there anything else he can take   [facility]

## 2020-02-25 NOTE — Telephone Encounter (Signed)
Will await response from Dr. Lorelei Pont prior to calling patient.

## 2020-02-25 NOTE — Telephone Encounter (Signed)
Please call  At the same time: Take 4 over the counter Ibuprofen 200 mg tablets 2 extra strength tylenol 1-2 tramadol.  It is ok to take 2 at a time. Taking all 3 together will work better.   He can do the above 3 times a day.  I really do not want to give him stronger narcotics.

## 2020-02-25 NOTE — Telephone Encounter (Signed)
Recommend follow up with his PCP who is a sports medicine doctor.

## 2020-02-26 NOTE — Telephone Encounter (Signed)
Tried to contact pt but mother answered phone. No ROI is signed to talk to mother. She said he always says it is ok to speak to her but he is not home currently. Advised to have pt call back when he is home. Mother verbalized understanding.

## 2020-02-26 NOTE — Telephone Encounter (Signed)
Advised pt of PCP directions. Pt wrote directions down and read back directions and verbalized understanding.

## 2020-03-02 MED ORDER — TRAMADOL HCL 50 MG PO TABS: 50.0000 mg | ORAL_TABLET | Freq: Four times a day (QID) | ORAL | 1 refills | Status: DC | PRN

## 2020-03-02 NOTE — Telephone Encounter (Signed)
done

## 2020-03-02 NOTE — Telephone Encounter (Signed)
Butch Penny,   Can you put this refill in the refill section of Epic for me and send to me?

## 2020-03-02 NOTE — Addendum Note (Signed)
Addended by: Owens Loffler on: 03/02/2020 03:04 PM   Modules accepted: Orders

## 2020-03-02 NOTE — Telephone Encounter (Addendum)
Pt's mother called stating he needed a refill of tramadol sent to [facility]. Please call [phone] with any questions.  Last filled 02-14-20 #15

## 2020-03-02 NOTE — Addendum Note (Signed)
Addended by: Carter Kitten on: 03/02/2020 02:14 PM   Modules accepted: Orders

## 2020-03-02 NOTE — Telephone Encounter (Signed)
Tramadol not on current medication list.  Please complete refill request below (unsigned order).

## 2020-03-15 ENCOUNTER — Ambulatory Visit
Admission: RE | Admit: 2020-03-15 | Discharge: 2020-03-15 | Disposition: A | Discharge status: Home / Self Care | Payer: Medicaid Other | Source: Ambulatory Visit | Attending: Family Medicine | Admitting: Family Medicine

## 2020-03-15 DIAGNOSIS — M1711 Unilateral primary osteoarthritis, right knee: Secondary | ICD-10-CM

## 2020-03-15 DIAGNOSIS — M2391 Unspecified internal derangement of right knee: Secondary | ICD-10-CM

## 2020-03-15 DIAGNOSIS — M25561 Pain in right knee: Secondary | ICD-10-CM

## 2020-03-15 DIAGNOSIS — G8929 Other chronic pain: Secondary | ICD-10-CM

## 2020-03-16 ENCOUNTER — Telehealth: Payer: Self-pay | Admitting: Family Medicine

## 2020-03-16 DIAGNOSIS — M84453A Pathological fracture, unspecified femur, initial encounter for fracture: Secondary | ICD-10-CM

## 2020-03-16 NOTE — Telephone Encounter (Signed)
[name] has a medial condylar fracture of his knee.    Dr. Marcelino Scot the ortho traumatologist has taken care of him for years, for some extremely complicated injuries.  Ideally, [name] could see him, but if his office / he does not want to see him for this, then I am sure [facility] would be happy to see him.  Non-urgent, but ideally he could be seen this week given Monday is labor day.

## 2020-04-08 ENCOUNTER — Encounter: Payer: Self-pay | Admitting: Family Medicine

## 2020-04-08 NOTE — Telephone Encounter (Signed)
I contacted [facility]/Dr. Carlean Jews office and spoke with Ria Comment. She states the patient was seen with Dr. Marcelino Scot on 04/01/20, and they are awaiting further imaging for him (Dr. Marcelino Scot will order) and she states they do plan to follow up with patient once these are completed.

## 2020-04-22 ENCOUNTER — Encounter: Payer: Self-pay | Admitting: Family Medicine

## 2020-04-24 ENCOUNTER — Other Ambulatory Visit: Payer: Self-pay

## 2020-04-24 NOTE — Telephone Encounter (Signed)
Patient contacted the office and states he would like to speak with Butch Penny. He would not relay any information to me.

## 2020-04-24 NOTE — Telephone Encounter (Signed)
Left message for Mayer to return my call.

## 2020-04-27 MED ORDER — ACETAMINOPHEN-CODEINE #3 300-30 MG PO TABS
1.0000 | ORAL_TABLET | ORAL | 0 refills | Status: DC | PRN
Start: 2020-04-27 — End: 2020-05-04

## 2020-04-27 NOTE — Addendum Note (Signed)
Addended by: Owens Loffler on: 04/27/2020 02:28 PM   Modules accepted: Orders

## 2020-04-27 NOTE — Telephone Encounter (Signed)
[facility] left v/m that they received Tylenol # 3 rx but pharmacy has to have documentation of diagnosis and what type pain pt is having. FYI to [facility] CMA.

## 2020-04-27 NOTE — Telephone Encounter (Signed)
Can you call [name]  Can you remind him on how to take ibuprofen.  Maximally he should take 4 tablets of the 200 mg over-the-counter strength 3 times daily.  This would equate to a total of 12 tablets a day.  He cannot take Tylenol along with the Tylenol 3 with codeine that was sent in for him.  He should not need pain management long-term after his knee injury heals.  For a short amount of time, this should be okay, although extended use of pain medicine can be habit-forming.  As long as he is conscious of this, it should be okay while he is bony anatomy is healing.

## 2020-04-27 NOTE — Telephone Encounter (Signed)
Pt came to clinic today to report he has seen Dr. Marcelino Scot who reports he does need surgery but does not currently have insurance and is filing for disability. But in the meantime he is still having pain. He reports Dr. Lorelei Pont said he should take 4 advil, 4 tylenol and tramadol for pain. He said he is ending up taking about 20 advil daily and 10 tylenol daily and it is really upsetting his stomach. He is wondering if there is something else that can be prescribed. He said he does not want to get hooked on anything but he cannot continue with these meds due to the stomach upset. Advised a msg would be sent to Dr. Lorelei Pont but he is out of the office until Wednesday. Pt verbalized understanding.

## 2020-04-27 NOTE — Telephone Encounter (Signed)
Spoke with [place] and gave them Dx and location of pain as requested.  Left message for [place] on home number to return my call.

## 2020-04-28 NOTE — Telephone Encounter (Signed)
Left message at home number for [place] to return my call.

## 2020-04-29 NOTE — Telephone Encounter (Signed)
No.  Stop tramadol.  Change to tylenol #3  Stop diclofenac.  It is ok to take ibuprofen with a maximum dose of 800 mg po TID. 4 tablets po TID  1 tylenol extra strength TID should be fine, too.

## 2020-04-29 NOTE — Addendum Note (Signed)
Addended by: Carter Kitten on: 04/29/2020 12:34 PM   Modules accepted: Orders

## 2020-04-29 NOTE — Telephone Encounter (Signed)
[name] notified as instructed by telpehone.  He is asking if he should continue taking Tramadol (which is not on his current medication list and he is out of) and the diclofenac.  Please advise.

## 2020-04-29 NOTE — Telephone Encounter (Signed)
Pt returned your call and requests a c/b on his cell [phone].  He has v/m but cannot get into it but will keep phone by him.  Thank you!

## 2020-04-29 NOTE — Telephone Encounter (Signed)
[name] notified as instructed by telephone.  Patient states understanding.  Medication list updated.

## 2020-05-04 ENCOUNTER — Encounter: Payer: Self-pay | Admitting: Family Medicine

## 2020-05-04 ENCOUNTER — Ambulatory Visit (INDEPENDENT_AMBULATORY_CARE_PROVIDER_SITE_OTHER): Payer: Medicaid Other | Admitting: Family Medicine

## 2020-05-04 ENCOUNTER — Other Ambulatory Visit: Payer: Self-pay

## 2020-05-04 VITALS — BP 160/80 | HR 91 | Temp 97.8°F | Resp 16 | Ht 69.75 in | Wt 234.0 lb

## 2020-05-04 DIAGNOSIS — F1410 Cocaine abuse, uncomplicated: Secondary | ICD-10-CM

## 2020-05-04 DIAGNOSIS — Z23 Encounter for immunization: Secondary | ICD-10-CM

## 2020-05-04 DIAGNOSIS — Z114 Encounter for screening for human immunodeficiency virus [HIV]: Secondary | ICD-10-CM

## 2020-05-04 DIAGNOSIS — Z131 Encounter for screening for diabetes mellitus: Secondary | ICD-10-CM

## 2020-05-04 DIAGNOSIS — Z0000 Encounter for general adult medical examination without abnormal findings: Secondary | ICD-10-CM

## 2020-05-04 DIAGNOSIS — Z1159 Encounter for screening for other viral diseases: Secondary | ICD-10-CM

## 2020-05-04 DIAGNOSIS — Z79899 Other long term (current) drug therapy: Secondary | ICD-10-CM
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
## 2020-05-04 NOTE — Patient Instructions (Addendum)
Voltaren 1% gel. Over the counter You can apply up to 4 times a day Minimal is absorbed in the bloodstream Cost is about 9 dollars   Get some of the   Lidocaine 4% cream and get some of the 4% lidocaine patches. - get some new tube.

## 2020-05-05 ENCOUNTER — Encounter: Payer: Self-pay | Admitting: Family Medicine

## 2020-05-05 LAB — BASIC METABOLIC PANEL
BUN: 16 mg/dL (ref 6–23)
CO2: 31 mEq/L (ref 19–32)
Calcium: 9.3 mg/dL (ref 8.4–10.5)
Chloride: 105 mEq/L (ref 96–112)
Creatinine, Ser: 1.1 mg/dL (ref 0.40–1.50)
GFR: 75.46 mL/min (ref >60.00)
Glucose, Bld: 90 mg/dL (ref 70–99)
Potassium: 4 mEq/L (ref 3.5–5.1)
Sodium: 143 mEq/L (ref 135–145)

## 2020-05-05 LAB — CBC WITH DIFFERENTIAL/PLATELET
Basophils Absolute: 0.1 10*3/uL (ref 0.0–0.1)
Basophils Relative: 0.7 % (ref 0.0–3.0)
Eosinophils Absolute: 0.4 10*3/uL (ref 0.0–0.7)
Eosinophils Relative: 4.5 % (ref 0.0–5.0)
HCT: 47.2 % (ref 39.0–52.0)
Hemoglobin: 16 g/dL (ref 13.0–17.0)
Lymphocytes Relative: 25.7 % (ref 12.0–46.0)
Lymphs Abs: 2.3 10*3/uL (ref 0.7–4.0)
MCHC: 33.9 g/dL (ref 30.0–36.0)
MCV: 90.4 fl (ref 78.0–100.0)
Monocytes Absolute: 0.7 10*3/uL (ref 0.1–1.0)
Monocytes Relative: 7.3 % (ref 3.0–12.0)
Neutro Abs: 5.6 10*3/uL (ref 1.4–7.7)
Neutrophils Relative %: 61.8 % (ref 43.0–77.0)
Platelets: 248 10*3/uL (ref 150.0–400.0)
RBC: 5.22 Mil/uL (ref 4.22–5.81)
RDW: 14.4 % (ref 11.5–15.5)
WBC: 9.1 10*3/uL (ref 4.0–10.5)

## 2020-05-05 LAB — HEPATIC FUNCTION PANEL
ALT: 22 U/L (ref 0–53)
AST: 16 U/L (ref 0–37)
Albumin: 4.3 g/dL (ref 3.5–5.2)
Alkaline Phosphatase: 113 U/L (ref 39–117)
Bilirubin, Direct: 0.1 mg/dL (ref 0.0–0.3)
Total Bilirubin: 0.3 mg/dL (ref 0.2–1.2)
Total Protein: 6.7 g/dL (ref 6.0–8.3)

## 2020-05-05 LAB — HEPATITIS C ANTIBODY
Hepatitis C Ab: NONREACTIVE
SIGNAL TO CUT-OFF: 0.03 (ref <1.00)

## 2020-05-05 LAB — HIV ANTIBODY (ROUTINE TESTING W REFLEX): HIV 1&2 Ab, 4th Generation: NONREACTIVE

## 2020-05-05 LAB — HEMOGLOBIN A1C: Hgb A1c MFr Bld: 6 % (ref 4.6–6.5)

## 2020-07-02 ENCOUNTER — Telehealth: Payer: Self-pay

## 2020-07-02 ENCOUNTER — Ambulatory Visit
Admission: EM | Admit: 2020-07-02 | Discharge: 2020-07-02 | Disposition: A | Discharge status: Home / Self Care | Payer: Medicaid Other | Attending: Emergency Medicine | Admitting: Emergency Medicine

## 2020-07-02 ENCOUNTER — Other Ambulatory Visit: Payer: Self-pay

## 2020-07-02 DIAGNOSIS — R6884 Jaw pain: Secondary | ICD-10-CM

## 2020-07-02 MED ORDER — AZITHROMYCIN 250 MG PO TABS: ORAL_TABLET | ORAL | 0 refills | Status: DC

## 2020-07-02 NOTE — Telephone Encounter (Signed)
[facility] - Client TELEPHONE ADVICE RECORD [facility] Patient Name: [name] Gender: Male DOB: 1966/01/19 Age: 54 Y 67 M 13 D Return Phone Number: [phone] (Primary) Address: City/State/Zip: [place] [address] Client [facility] - Client Client Site [facility] - Day Physician Copland, Frederico Hamman - MD Contact Type Call Who Is Calling Patient / Member / Family / Caregiver Call Type Triage / Clinical Relationship To Patient Self Return Phone Number [phone] (Primary) Chief Complaint Tooth pain Reason for Call Symptomatic / Request for [place] states he was told his tooth pain and was told by his mother that it is a sign of heart attack because that is what his father died of. He is not having any symptoms. He states that he is calling for his mother and when you call back speak to her regarding issues. [facility] ER Translation No Nurse Assessment Nurse: Alveta Heimlich, RN, Rise Paganini Date/Time Eilene Ghazi Time): 07/02/2020 8:28:26 AM Confirm and document reason for call. If symptomatic, describe symptoms. ---Caller state he has had some teeth pain. It is in all the teeth, upper and lower on the left that started on Sunday. He was also having jaw pain then. His father was having the same type of pain and was having a heart attack. The pain had gone on Monday, came back on Wednesday, and went away at 7am this morning. No other pain , no fever. Has had this pain in the past. Does the patient have any new or worsening symptoms? ---Yes Will a triage be completed? ---Yes Related visit to physician within the last 2 weeks? ---No Does the PT have any chronic conditions? (i.e. diabetes, asthma, this includes High risk factors for pregnancy, etc.) ---Yes List chronic conditions. ---HTN, depression Is this a behavioral health or substance abuse call?  ---No Guidelines Guideline Title Affirmed Question Affirmed Notes Nurse Date/Time Eilene Ghazi Time) Face Pain [1] New-onset jaw pain AND [2] unknown cause AND [3] at least one cardiac risk factor (i.e., hypertension, diabetes, Alveta Heimlich, RN, Rise Paganini 07/02/2020 8:33:55 AM PLEASE NOTE: All timestamps contained within this report are represented as [place] Standard Time. CONFIDENTIALTY NOTICE: This fax transmission is intended only for the addressee. It contains information that is legally privileged, confidential or otherwise protected from use or disclosure. If you are not the intended recipient, you are strictly prohibited from reviewing, disclosing, copying using or disseminating any of this information or taking any action in reliance on or regarding this information. If you have received this fax in error, please notify us immediately by telephone so that we can arrange for its return to [place]. Phone: [phone], Toll-Free: [phone], Fax: [phone] Page: 2 of 2 Call Id: [id number] Guidelines Guideline Title Affirmed Question Affirmed Notes Nurse Date/Time Eilene Ghazi Time) obesity, smoker or strong family history of heart disease) Disp. Time Eilene Ghazi Time) Disposition Final User 07/02/2020 8:35:49 AM Go to ED Now (or PCP triage) Yes Alveta Heimlich, RN, Ali Lowe Disagree/Comply Comply Caller Understands Yes PreDisposition Call Doctor Care Advice Given Per Guideline GO TO ED NOW (OR PCP TRIAGE): * IF NO PCP (PRIMARY CARE PROVIDER) SECOND-LEVEL TRIAGE: You need to be seen within the next hour. Go to the [facility] [place] at _____________ [place] as soon as you can. * IF PCP SECONDLEVEL TRIAGE REQUIRED: You may need to be seen. Your doctor (or NP/PA) will want to talk with you to decide what's best. I'll page the provider on-call now.  If you haven't heard from the provider (or me) within 30 minutes, go directly to the [facility] at [facility]. * It is better and safer if another adult  drives instead of you. CARE ADVICE given per Face Pain (Adult) guideline. Comments User: Debby Bud, RN Date/Time Eilene Ghazi Time): 07/02/2020 8:37:40 AM Pt mentions he was also having indigestion Sunday night into monday. Referrals GO TO FACILITY OTHER - SPECIFY

## 2020-07-02 NOTE — Discharge Instructions (Addendum)
EKG reassuring Please be sure to take blood pressure medicine as prescribed and monitor pressure Begin azithromycin to treat underlying ear infection Tylenol and ibuprofen as needed for pain Follow-up if any symptoms not improving or worsening
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
## 2020-07-02 NOTE — Telephone Encounter (Signed)
appropriate

## 2020-07-02 NOTE — ED Triage Notes (Signed)
Patient states he intermittent jaw pain on the left side from the joint to the chin with no pain radiation to any other areas. Pt complains of no lightheaded or dizzy episodes. Pt is diagnosed with hypertension and is intermittent with his prescribed meds. PT is aox4 and ambulatory.

## 2020-07-02 NOTE — Telephone Encounter (Signed)
I spoke with pts mother per pts permission on access nurse note; Mrs Mcmanaway said pts father had similar heart issues and symptoms; on 06/28/20 pt had all teeth on lt side hurting;lt jaw pain and also feeling of indigestion.pain comes and goes since 06/28/20. No CP and no SOB; pt feels OK right now but but Teeth and lt jaw hurting earlier this morning but not now. pts mom is not sure if he has had these symptoms before. Pt is down the street at a neighbors now; pt will not answer cell phone and v/m not set up. pts mom said pt is not going to go to ED and wait for hours. pts mom is going to go to neighbors home and she will take pt to [facility] on [address] for eval and possible testing. I called pts mom back to verify pt is OK and is going to [facility] on [address]. Mrs Matthis said pt is on his way to UC on [address] now. FYI to Dr Lorelei Pont.

## 2020-07-15 ENCOUNTER — Telehealth: Payer: Self-pay | Admitting: Family Medicine

## 2020-07-15 NOTE — Telephone Encounter (Signed)
He needs to call his psychiatrist.  They are managing his mental health, and he absolutely needs their involvement.

## 2020-07-15 NOTE — Telephone Encounter (Signed)
Pt called in about his depression , and anytime he opens up to the therapist she wants to put in the mental hospital and he is out of his medication.

## 2020-07-15 NOTE — Telephone Encounter (Signed)
This is a challenging situation.  He had been doing quite a bit better with management by psychiatry, and the psychiatrist and I have spoken on the telephone.  She and I both agree completely that she needs to be managing his medication 100% of the time.  [name] on multiple number of occasions is asked me to renew his medication or discuss his medication.  Frankly, he has done much better with psychiatric care, and it took me quite a bit of time to get him into a psychiatrist who would see him.  I have asked him to call his psychiatrist today.  He agrees.

## 2020-07-15 NOTE — Telephone Encounter (Signed)
[name] notified as instructed by telephone.  Again he states every time he is open and honest with his psychiatrist, she tries to admit him to a mental hospital.  He states so now he just lies to her.  He thought that he was suppose to be up front and honest with them so they could help him.  He has not been able to afford his medication the last couple of months so he has not seen his psychiatrist.   Again I advised that per Dr. Patsy Lager he has to reach out to his psychiatrist since they manage his mental health.  Patient states understanding.

## 2020-12-30 ENCOUNTER — Telehealth: Payer: Self-pay

## 2020-12-30 NOTE — Telephone Encounter (Signed)
Patient called in stating he is filing for disability and the disability has faxed over several requests for office notes, advised he should contact Medical Records - gave him the number to call and told to call back if any questions.

## 2021-02-11 ENCOUNTER — Telehealth: Payer: Self-pay | Admitting: *Deleted

## 2021-02-11 NOTE — Telephone Encounter (Signed)
I'll defer to PCP and not intervene o/w.

## 2021-02-11 NOTE — Telephone Encounter (Signed)
Pt called in asking if he could get an appt with Dr. Damita Dunnings, I asked why he wanted to see Dr. Damita Dunnings and not PCP. Pt stated he knew Dr. Damita Dunnings would prescribe him new physiatric medication. He said he has a physiatrist who "isn't listening to him" and will not change or increase his med. He said he is on the max dose of his meds and it's not helping. He said PCP last time just told him to "do what the physiatrist said and wouldn't give him any more meds". He said he has seen Dr. Damita Dunnings in the past and is sure he would give him different/stronger medication. I explained that other providers can see pt's for acute issues but once you start discuss chronic issues and adjusting/changing meds that the PCP has to handle that.   Pt then asked me if I could switch his PCP to Dr. Damita Dunnings. I advised pt of the process of switching PCP's where I have to send both of them the message asking to switch and then he would get a f/u phone call with the answer. Pt then said never mind he will just see Dr. Lorelei Pont and discuss his concerns then and "go from there"  He also said he wanted to discuss prostate issues with PCP. Appt scheduled Monday 02/15/21 3:20pm  to discuss both issues   FYI to PCP an also Dr. Damita Dunnings so he is aware of pt's comments

## 2021-02-14 NOTE — Progress Notes (Addendum)
[name] T. [name], MD, CAQ Sports Medicine [facility] at [facility] [address] [place], [address]  Phone: [phone]  FAX: [phone]  [name] - 55 y.o. male  MRN [id number]  Date of Birth: [birth year]  Date: 02/15/2021  PCP: Hannah Beat, MD  Referral: Hannah Beat, MD  Chief Complaint  Patient presents with  . Medication Management    Discuss Psych Meds  . Prostate Issues    This visit occurred during the SARS-CoV-2 public health emergency.  Safety protocols were in place, including screening questions prior to the visit, additional usage of staff PPE, and extensive cleaning of exam room while observing appropriate contact time as indicated for disinfecting solutions.   Subjective:   [name] is a 55 y.o. very pleasant male patient with Body mass index is 34.43 kg/m. who presents with the following:  He is here to come in for discussion in the office today.  He comes in with a number of different questions and sensations going on in his body.  Long-standing depression, at times severe with SI.  He has done better since he started to see Psychiatry, but he wanted to come in to discuss this with me.  He describes some pain in his testicles for a lot last 3 weeks but ultimately this history became his hypogastric region in general and not directly in his testicles itself.  He does not point to his testicles or in the region of his prostate.  He does not point to inguinal canal.  He has pain and having bowel movements normally. -- Eval testicles. 3 weeks.  Hypogastric pain.   Epigastric Diffuse mild abd pain  Will have to the bathroom. 15 times a day. 6 times at night.  This is always been an issue, but this is worsened recently. Last time he was in the office his hemoglobin A1c was 6. He also does have a history of distant alcohol abuse, but no known history of pancreatitis or cirrhosis.  Psych medication: "Lies to her  every time." Does not like Zoloft Does not have any SI at all.  He has not had this in a very long time since being on Zoloft, but previously he had some intermittent suicidal ideation for greater than 1 year when I was trying to manage this myself, and we had a great deal of difficulty getting him in and going to the psychiatrist. Now taking Zoloft 150 mg 6 weeks ago, went up.  He does note that he feels better after elevating his medication. He has also been there intermittently doing cocaine.  Now has met a girl that he likes.  Dated about thirty years ago. She had a bad marriage.   Cialis and Cocaine - increased risk. He has some tadalafil tablets that he got from a friend that appear to be from an Loss adjuster, chartered. He also has white pills that are in a bag, plastic. He questions whether or not these would help with his erectile quality.  Dr. B at [facility] in [facility]. Call and let her know that [name] is not doing all that well at his request.  Addendum:  [name] calls in today, and his RUQ > RLQ abdominal pain has worsened and is now 5/10 all of the time.  Review of Systems is noted in the HPI, as appropriate  Objective:   BP (!) 160/90   Pulse 89   Temp 98.1 F (36.7 C) (Temporal)   Ht 5'  9.75" (1.772 m)   Wt 238 lb 4 oz (108.1 kg)   SpO2 97%   BMI 34.43 kg/m   GEN: No acute distress; alert,appropriate. CV: RRR, no m/g/r  PULM: Normal respiratory rate, no accessory muscle use. No wheezes, crackles or rhonchi  ABD: S, nonspecific abdominal pain throughout most of his abdomen, ND, + BS, No rebound, No HSM.  No fluid wave. PSYCH: Somewhat anxious appearing.  Somewhat pressured. Neuro: He does have some tremulousness and tremor in his extremities that is different compared to prior examinations GU: No tenderness at testicles, penis, no discharge, no ulcerations or warts.  Nontender at the inguinal canal, no palpable hernias throughout the lower abdomen.  Laboratory and  Imaging Data:  Assessment and Plan:     ICD-10-CM   1. Major depressive disorder, recurrent episode, moderate (HCC)  F33.1     2. Hypogastric pain  R10.2 US Abdomen Complete    CANCELED: CT Abdomen Pelvis W Contrast    3. RUQ pain  R10.11 Lipase    US Abdomen Complete    CANCELED: Lipase    CANCELED: CT Abdomen Pelvis W Contrast    4. Epigastric pain  R10.13 H. pylori antibody, IgG    Lipase    US Abdomen Complete    CANCELED: Lipase    CANCELED: H. pylori antibody, IgG    CANCELED: CT Abdomen Pelvis W Contrast    5. Pain in both testicles  N50.811    N50.812     6. Polyuria  R35.89 CANCELED: PSA, Total with Reflex to PSA, Free    7. Screening for malignant neoplasm of prostate  Z12.5 PSA, Total with Reflex to PSA, Free    CANCELED: PSA, Total with Reflex to PSA, Free    8. Other fatigue  R11.65 Basic metabolic panel    CBC with Differential/Platelet    Hepatic function panel    TSH    Vitamin B12    CANCELED: Basic metabolic panel    CANCELED: CBC with Differential/Platelet    CANCELED: Hepatic function panel    CANCELED: Vitamin B12    CANCELED: TSH    9. Screening for diabetes mellitus  Z13.1 Hemoglobin A1c    CANCELED: Hemoglobin A1c    10. Tremulousness  R25.1 Vitamin B12    CANCELED: Vitamin B12    11. Erectile dysfunction of organic origin  N52.9      Total encounter time: 43 minutes. This includes total time spent on the day of encounter.  Extensive conversations across multiple organ systems.  Quite dramatic excessive urination.  Check labs, assess for diabetes, if not then add Flomax.  Abdominal pain described by the patient is testicular pain without tenderness at the testicle.  Nonfocal exam, but mild pain.  Ongoing for some time.  Multiple etiologies possible, assess with lipase, H. pylori, hepatic function panel, PSA.  Erectile dysfunction.  Organic, suspect due to longstanding hypertension and substance abuse.  Depression: Active, moderate, no  suicidal ideation.  This is been a longstanding issue with the patient going on for years.  I did try to manage this myself when I could not get him into psychiatry for quite some time, and he did at times do okay, and other times he did do poorly.  He had some intermittent suicidal ideation off and on for greater than a year.  Since he has been seeing psychiatry, he does appear to be better, he feels better, and he has not had any suicidal ideation while being on  Zoloft. -Nevertheless, he tells me he does not like Zoloft, and he would like to consider other medications or some type of adjunct medication. -I think that this needs to be addressed by psychiatry.  He asked me that he contact psychiatry, and I will do my best and call and at the very least leave a message.  I have done this. -Strong suspicion that intermittent cocaine ongoing actively now is impairing psychiatric state, and we reviewed that I am doubtful that he will ever feel normal well routinely taking cocaine even if on medication.  Addendum: 03/11/21 1:39 PM  The patient calls in with worsening of his abdominal pain, increased to 5/10 RUQ > RLQ, escalating from recent OV 02/15/2021.  He has a contrast allergy.  I will obtain a abdominal CT of the abdomen. Electronically Signed  By: Owens Loffler, MD On: 03/11/2021 1:39 PM   Addendum: 03/11/21 1:39 PM  I had wanted to get an [place], but Radiology called to say that CT was needed with contrast. Given contrast allergy, will have to preload with steroids.   Addendum: 03/11/21 1:39 PM  At this point, the patient called to cancel his CT and relates that he has no pain.  He is fearful given his contrast allergy. Electronically Signed  By: Owens Loffler, MD On: 03/11/2021 1:39 PM   Orders Placed This Encounter  Procedures  . US Abdomen Complete  . Basic metabolic panel  . CBC with Differential/Platelet  . H. pylori antibody, IgG  . Hemoglobin A1c  . Hepatic function panel  . PSA, Total  with Reflex to PSA, Free  . TSH  . Vitamin B12  . Lipase    Follow-up: No follow-ups on file.  Signed,  Maud Deed. Yanilen Adamik, MD   Outpatient Encounter Medications as of 02/15/2021  Medication Sig  . amLODipine (NORVASC) 10 MG tablet Take 1 tablet (10 mg total) by mouth daily.  Marland Kitchen EPINEPHrine (EPIPEN 2-PAK) 0.3 mg/0.3 mL IJ SOAJ injection Inject 0.3 mLs (0.3 mg total) into the muscle as needed for anaphylaxis.  . fluticasone (FLONASE) 50 MCG/ACT nasal spray Place 2 sprays into both nostrils daily as needed for allergies or rhinitis.  Marland Kitchen ibuprofen (ADVIL) 200 MG tablet Take 800 mg by mouth 3 (three) times daily.  . predniSONE (DELTASONE) 50 MG tablet Take 1 tablet 13 hours, then 7 hours, then 1 hour prior to CT scan  . sertraline (ZOLOFT) 100 MG tablet Take 150 mg by mouth daily.  . [DISCONTINUED] azithromycin (ZITHROMAX) 250 MG tablet Take first 2 tablets together, then 1 every day until finished.  . [DISCONTINUED] diphenhydrAMINE (BENADRYL) 25 MG tablet Take 1 tablet (25 mg total) by mouth every 6 (six) hours as needed for up to 5 days.   No facility-administered encounter medications on file as of 02/15/2021.

## 2021-02-15 ENCOUNTER — Other Ambulatory Visit: Payer: Self-pay

## 2021-02-15 ENCOUNTER — Encounter: Payer: Self-pay | Admitting: Family Medicine

## 2021-02-15 ENCOUNTER — Ambulatory Visit (INDEPENDENT_AMBULATORY_CARE_PROVIDER_SITE_OTHER): Payer: Medicaid Other | Admitting: Family Medicine

## 2021-02-15 VITALS — BP 160/90 | HR 89 | Temp 98.1°F | Ht 69.75 in | Wt 238.2 lb

## 2021-02-15 DIAGNOSIS — R251 Tremor, unspecified: Secondary | ICD-10-CM

## 2021-02-15 DIAGNOSIS — F331 Major depressive disorder, recurrent, moderate: Secondary | ICD-10-CM

## 2021-02-15 DIAGNOSIS — R5383 Other fatigue: Secondary | ICD-10-CM

## 2021-02-15 DIAGNOSIS — Z125 Encounter for screening for malignant neoplasm of prostate: Secondary | ICD-10-CM

## 2021-02-15 DIAGNOSIS — Z131 Encounter for screening for diabetes mellitus: Secondary | ICD-10-CM

## 2021-02-15 DIAGNOSIS — R3589 Other polyuria: Secondary | ICD-10-CM

## 2021-02-15 DIAGNOSIS — N50811 Right testicular pain: Secondary | ICD-10-CM

## 2021-02-15 DIAGNOSIS — R102 Pelvic and perineal pain: Secondary | ICD-10-CM

## 2021-02-15 DIAGNOSIS — R1013 Epigastric pain: Secondary | ICD-10-CM

## 2021-02-15 DIAGNOSIS — R1011 Right upper quadrant pain: Secondary | ICD-10-CM

## 2021-02-15 DIAGNOSIS — N50812 Left testicular pain: Secondary | ICD-10-CM

## 2021-02-15 DIAGNOSIS — N529 Male erectile dysfunction, unspecified: Secondary | ICD-10-CM

## 2021-02-17 ENCOUNTER — Other Ambulatory Visit (INDEPENDENT_AMBULATORY_CARE_PROVIDER_SITE_OTHER): Payer: Medicaid Other

## 2021-02-17 ENCOUNTER — Other Ambulatory Visit: Payer: Self-pay

## 2021-02-17 DIAGNOSIS — R5383 Other fatigue: Secondary | ICD-10-CM

## 2021-02-17 DIAGNOSIS — R251 Tremor, unspecified: Secondary | ICD-10-CM

## 2021-02-17 DIAGNOSIS — R1011 Right upper quadrant pain: Secondary | ICD-10-CM

## 2021-02-17 DIAGNOSIS — R1013 Epigastric pain: Secondary | ICD-10-CM

## 2021-02-17 DIAGNOSIS — Z131 Encounter for screening for diabetes mellitus: Secondary | ICD-10-CM

## 2021-02-17 DIAGNOSIS — Z125 Encounter for screening for malignant neoplasm of prostate: Secondary | ICD-10-CM

## 2021-02-17 LAB — BASIC METABOLIC PANEL
BUN: 15 mg/dL (ref 6–23)
CO2: 29 mEq/L (ref 19–32)
Calcium: 9.2 mg/dL (ref 8.4–10.5)
Chloride: 102 mEq/L (ref 96–112)
Creatinine, Ser: 1.46 mg/dL (ref 0.40–1.50)
GFR: 53.9 mL/min — ABNORMAL LOW (ref >60.00)
Glucose, Bld: 132 mg/dL — ABNORMAL HIGH (ref 70–99)
Potassium: 4.1 mEq/L (ref 3.5–5.1)
Sodium: 140 mEq/L (ref 135–145)

## 2021-02-17 LAB — HEPATIC FUNCTION PANEL
ALT: 21 U/L (ref 0–53)
AST: 18 U/L (ref 0–37)
Albumin: 4.1 g/dL (ref 3.5–5.2)
Alkaline Phosphatase: 112 U/L (ref 39–117)
Bilirubin, Direct: 0.1 mg/dL (ref 0.0–0.3)
Total Bilirubin: 0.7 mg/dL (ref 0.2–1.2)
Total Protein: 6.8 g/dL (ref 6.0–8.3)

## 2021-02-17 LAB — CBC WITH DIFFERENTIAL/PLATELET
Basophils Absolute: 0 10*3/uL (ref 0.0–0.1)
Basophils Relative: 0.4 % (ref 0.0–3.0)
Eosinophils Absolute: 0.3 10*3/uL (ref 0.0–0.7)
Eosinophils Relative: 3.2 % (ref 0.0–5.0)
HCT: 48.9 % (ref 39.0–52.0)
Hemoglobin: 16.4 g/dL (ref 13.0–17.0)
Lymphocytes Relative: 22 % (ref 12.0–46.0)
Lymphs Abs: 1.8 10*3/uL (ref 0.7–4.0)
MCHC: 33.5 g/dL (ref 30.0–36.0)
MCV: 89.7 fl (ref 78.0–100.0)
Monocytes Absolute: 0.5 10*3/uL (ref 0.1–1.0)
Monocytes Relative: 6.1 % (ref 3.0–12.0)
Neutro Abs: 5.5 10*3/uL (ref 1.4–7.7)
Neutrophils Relative %: 68.3 % (ref 43.0–77.0)
Platelets: 204 10*3/uL (ref 150.0–400.0)
RBC: 5.45 Mil/uL (ref 4.22–5.81)
RDW: 14.3 % (ref 11.5–15.5)
WBC: 8.1 10*3/uL (ref 4.0–10.5)

## 2021-02-17 LAB — HEMOGLOBIN A1C: Hgb A1c MFr Bld: 6.1 % (ref 4.6–6.5)

## 2021-02-17 LAB — TSH: TSH: 3.34 u[IU]/mL (ref 0.35–5.50)

## 2021-02-17 LAB — H. PYLORI ANTIBODY, IGG: H Pylori IgG: NEGATIVE

## 2021-02-17 LAB — VITAMIN B12: Vitamin B-12: 189 pg/mL — ABNORMAL LOW (ref 211–911)

## 2021-02-17 LAB — LIPASE: Lipase: 135 U/L — ABNORMAL HIGH (ref 11.0–59.0)

## 2021-02-18 LAB — PSA, TOTAL WITH REFLEX TO PSA, FREE: PSA, Total: 1 ng/mL (ref <=4.0)

## 2021-02-22 ENCOUNTER — Telehealth: Payer: Self-pay | Admitting: Family Medicine

## 2021-02-22 NOTE — Telephone Encounter (Signed)
See my result note.

## 2021-02-22 NOTE — Telephone Encounter (Signed)
Mr. Aderholt called in and wanted to know about his lab results he had done on 8/3.

## 2021-03-02 ENCOUNTER — Telehealth: Payer: Self-pay | Admitting: Family Medicine

## 2021-03-02 NOTE — Telephone Encounter (Signed)
Ok, I put in an order for him to get an abdominal ultrasound - needs to be checked out more.

## 2021-03-02 NOTE — Telephone Encounter (Signed)
I need greater clarity  We talked about a few things.  Does he mean his abdominal pain?  Or a specific other location?

## 2021-03-02 NOTE — Telephone Encounter (Signed)
I spoke with pt; pt said his abd pain has worsened since 02/15/21 visit; pt said he is having rt lower abd pain that is dull and constant with no radiation of pain to his back. Pain level now is 5. Pt also having rt upper abd pain under ribs that seems worse now than the lower abd pain. Pt still gives pain level of 5. Pt said he is concerned his pain might be coming from gallbladder. Pt does not think he has a fever. 03/02/21 pt had a constipated stool this morning but on 03/01/21 pt had watery diarrhea. pt has frequency of urine but no burning or pain upon urination.pt is having a lot of gas and is nauseated all the time but has not vomited. Pt said he has been existing on bananas and applesauce. No alcohol or anything greasy or spicy. Diet does not seem to be helping the abd pain. Pt said he has never had abd pain this bad. Pt request cb after reviewed by Dr Lorelei Pont. [employer]. UC & ED precautions given and pt voiced understanding.sending note to Dr Lorelei Pont and Butch Penny CMA.

## 2021-03-02 NOTE — Telephone Encounter (Signed)
Pt was seen on 02/17/21. He stated since this appt his pain has gotten worse and wanted to know what he could do

## 2021-03-02 NOTE — Addendum Note (Signed)
Addended by: Owens Loffler on: 03/02/2021 03:41 PM   Modules accepted: Orders

## 2021-03-02 NOTE — Telephone Encounter (Signed)
Rena,   Can you please triage [name]?

## 2021-03-02 NOTE — Telephone Encounter (Signed)
Spoke with Ms. Baranski (mom) and advised them that Dr. Lorelei Pont has ordered an abdominal ultrasound and our office will be contracting him with that appointment.

## 2021-03-03 ENCOUNTER — Telehealth: Payer: Self-pay | Admitting: Family Medicine

## 2021-03-03 NOTE — Telephone Encounter (Signed)
I need clarity  What additional order do they need to see that part of the anatomy?  If they cannot do it, then who can do it?  The hospital?

## 2021-03-03 NOTE — Telephone Encounter (Signed)
[facility] Ms. April  called in and stated that the ultrasound of the abdomen only views the liver, gallbladder, pancreas, spleen. Its not going to show the lower part of the abdomen. And is in need of another order. And if its for the appendix or the lower intestine they can not do that there.  And she can be reached [phone]

## 2021-03-04 NOTE — Telephone Encounter (Signed)
Pt called back - aware that we are working to figure out how to safely scan him.   Will send to Dr Lorelei Pont as he is reviewing the chart

## 2021-03-04 NOTE — Telephone Encounter (Signed)
Received a Call back from [place] with [facility]   Correct order: Abd Pelvic CT will include Gallbladder, liver and Appendix  US for appendix are usually only done on children.   Please do CT Abd/Pelvis  order for any location - can use [facility]

## 2021-03-04 NOTE — Telephone Encounter (Signed)
Pt called to follow up on Ultrasound

## 2021-03-04 NOTE — Telephone Encounter (Addendum)
Spoke with [place] with [place],  If needing a scan of the appendix this order will need to go through [facility] or [facility] - will have to clarify with them what order to place. [facility] does not do scans of the appendix - they do not have the technology to view  If wanting to just view the lower abdomen - not specifically the Appendix then will need to place an order for US Pelvis Limited ( this can be done at [place]).  Must Notate reason - "inguinal hernia, groin lymphadenopathy (R vs L), etc..." She recommended that I call [facility] to discuss order with them  Called and spoke with Tori with [facility] [place] [facility] to see what type order needs to be placed - she is going to call back once she finds out

## 2021-03-04 NOTE — Telephone Encounter (Signed)
[name],  Can you help me with this and find out what exactly they are needing.

## 2021-03-10 MED ORDER — PREDNISONE 50 MG PO TABS: ORAL_TABLET | ORAL | 0 refills | Status: DC

## 2021-03-10 NOTE — Telephone Encounter (Signed)
Please call [name]:  I have ordered his CT scan.  Since he has an allergy, he has to premedicate with prednisone. Very, very important.  - prednisone dose will be on the bottle  Prednisone 50 mg. 1 tablet, total of #3 tablets 13 hours before CT 7 hours before CT 1 hour before CT  Benadryl 50 mg 1 hour before CT

## 2021-03-10 NOTE — Addendum Note (Signed)
Addended by: Owens Loffler on: 03/10/2021 05:24 PM   Modules accepted: Orders

## 2021-03-11 NOTE — Telephone Encounter (Signed)
[name] notified as instructed by telephone.  I had [name] also write instructions down.  Patient states understanding.

## 2021-03-11 NOTE — Addendum Note (Signed)
Addended by: Owens Loffler on: 03/11/2021 01:40 PM   Modules accepted: Orders
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
## 2021-03-11 NOTE — Telephone Encounter (Signed)
[name]'s mom notified as instructed by telephone.

## 2021-03-11 NOTE — Telephone Encounter (Signed)
We will cancel his CT  Please let know

## 2021-03-28 ENCOUNTER — Encounter: Payer: Self-pay | Admitting: Gastroenterology

## 2021-04-19 IMAGING — CT CT HEAD W/O CM
3 series · 15 of 47 positions shown, 18 images · non-contrast
Comparison: None.

CLINICAL DATA: Patient c/o "dizzy spells x 2 days. Patient states
at times he feels like he is going to pass out." Patient states he
gets a "headache when the dizziness is really bad"

EXAM:
CT HEAD WITHOUT CONTRAST
TECHNIQUE: Contiguous axial images were obtained from the base of the skull
through the vertex without intravenous contrast.

[Series 4: head wo · axial · 0.49mm/px · z∈[-114,+36]mm · 9 of 36 slices shown, 12 images]
[im 3/36  brain]
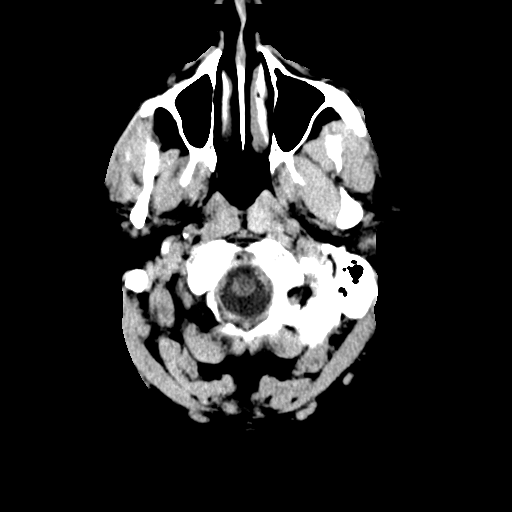
[im 3/36  bone]
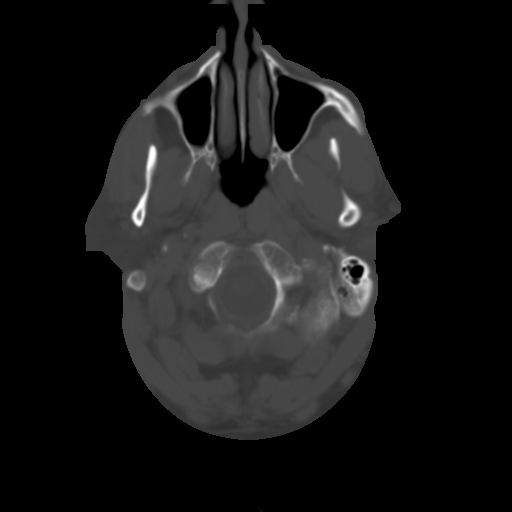
[im 7/36  brain]
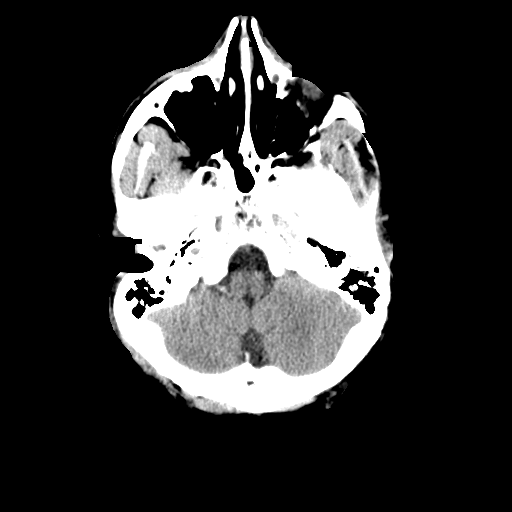
[im 10/36  brain]
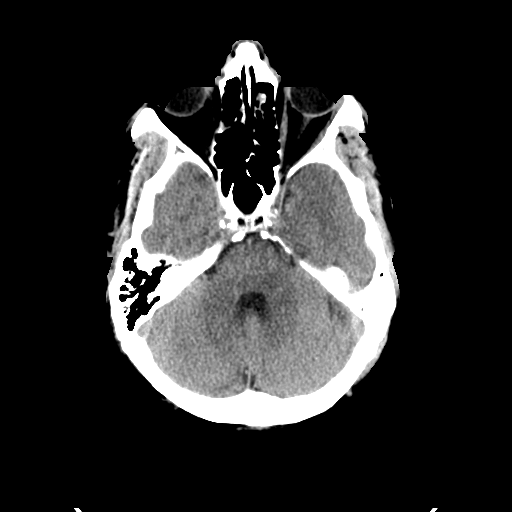
[im 14/36  brain]
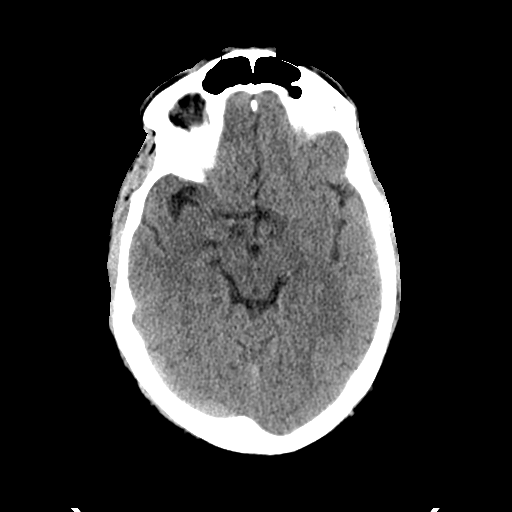
[im 19/36  brain]
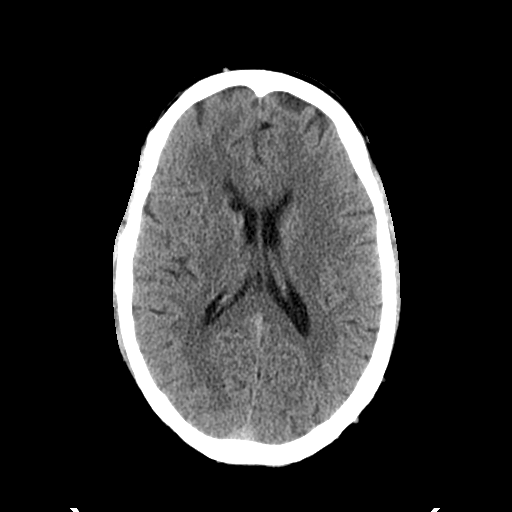
[im 19/36  bone]
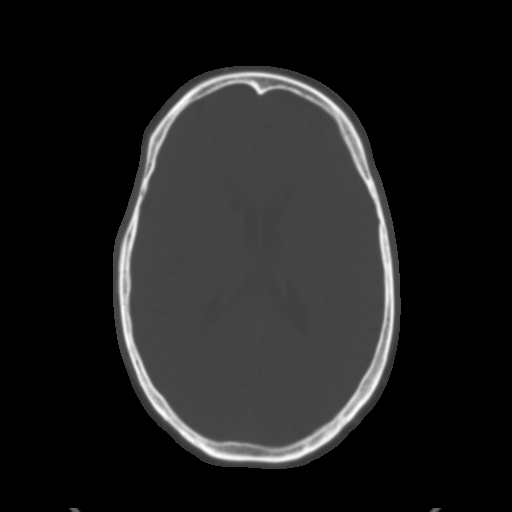
[im 22/36  brain]
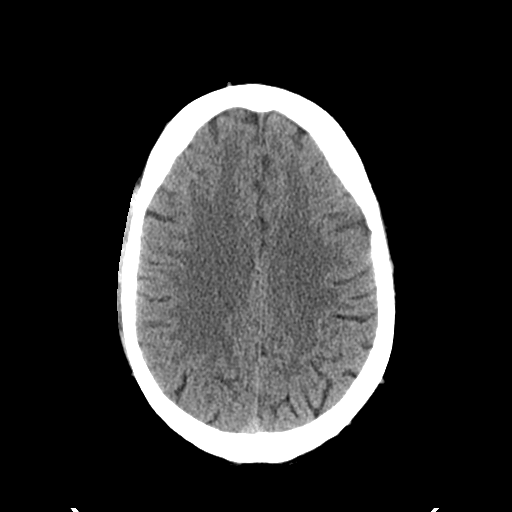
[im 26/36  brain]
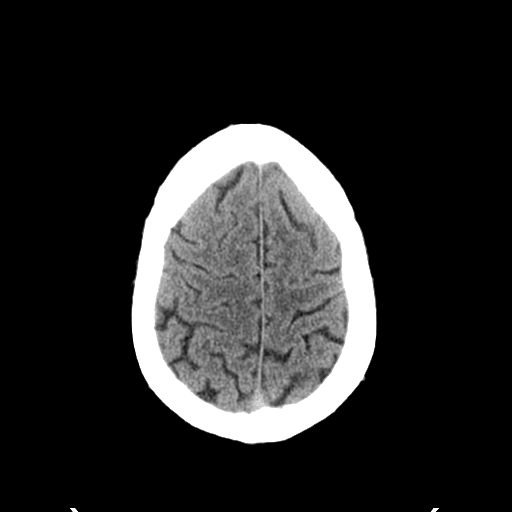
[im 29/36  brain]
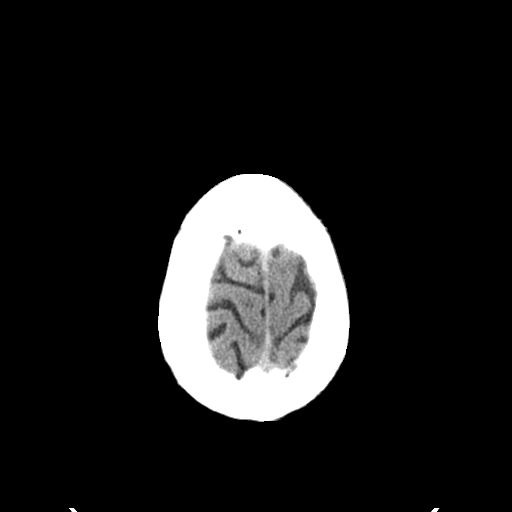
[im 33/36  brain]
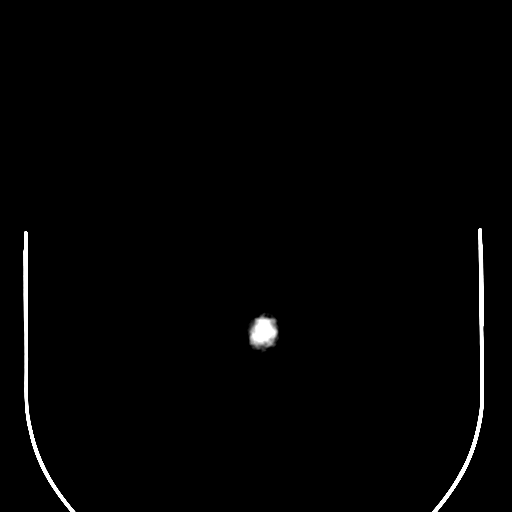
[im 33/36  bone]
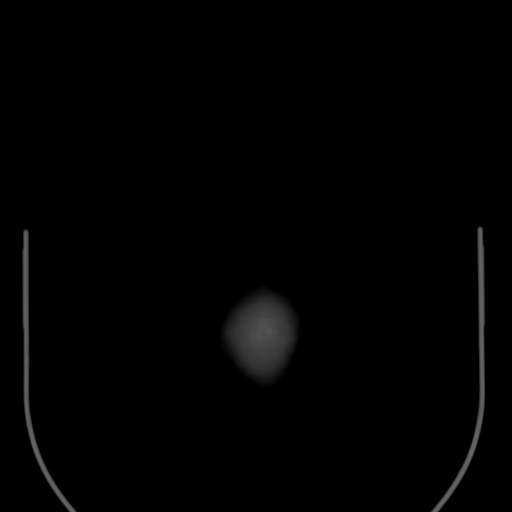

[Series 5: coronal soft tissue · coronal · 0.34mm/px · 3 of 83 slices shown]
[im 28/83  brain]
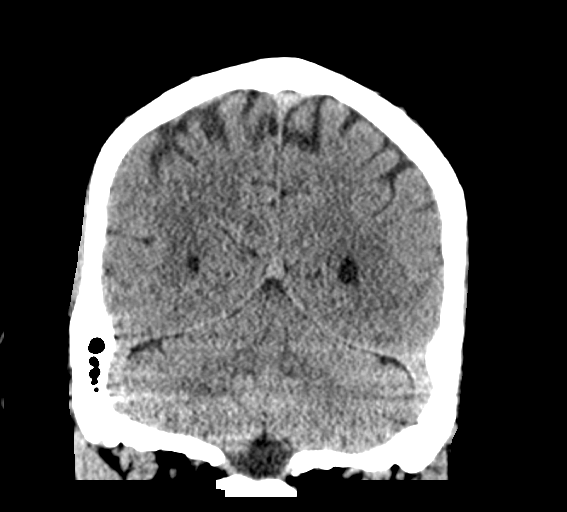
[im 37/83  brain]
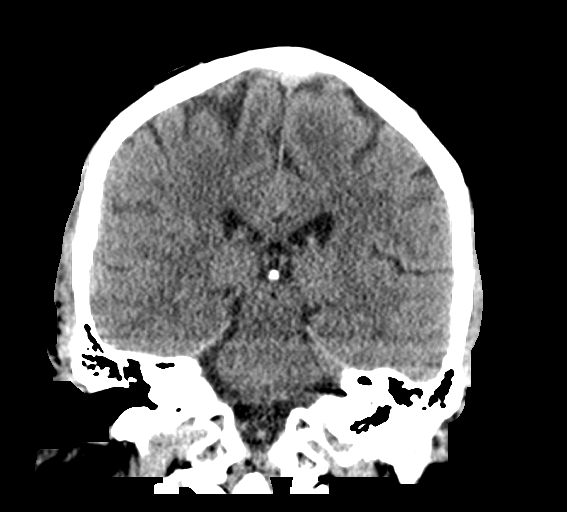
[im 46/83  brain]
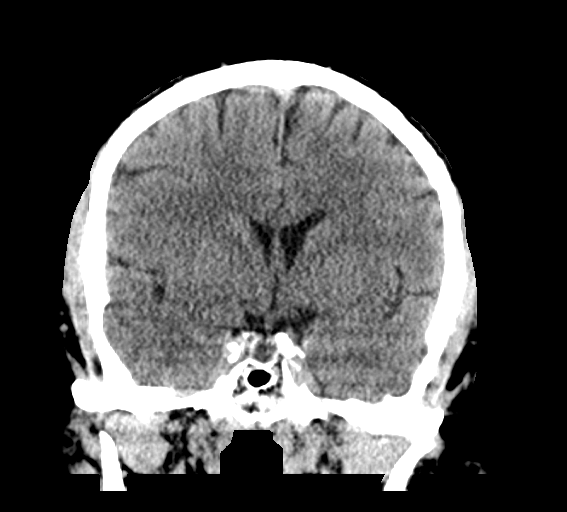

[Series 6: sagittal soft tissue · sagittal · 0.34mm/px · 3 of 65 slices shown]
[im 22/65  brain]
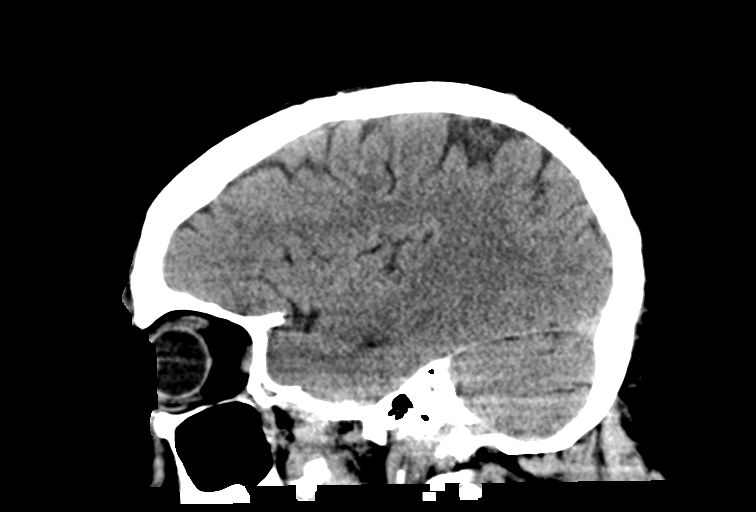
[im 33/65  brain]
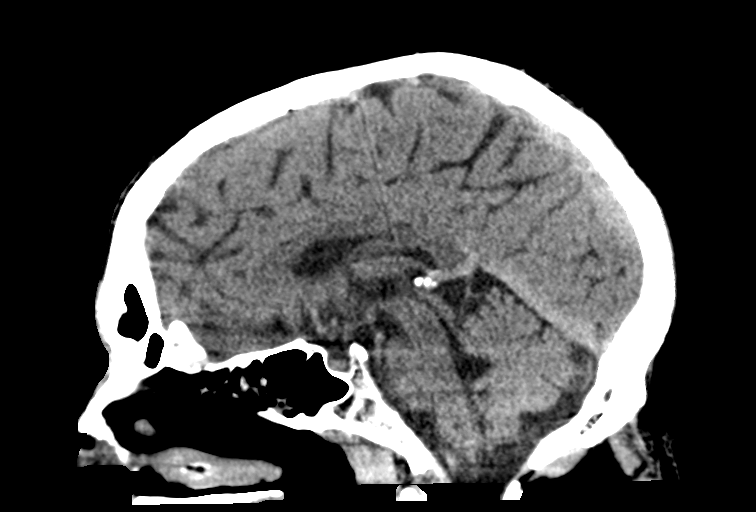
[im 43/65  brain]
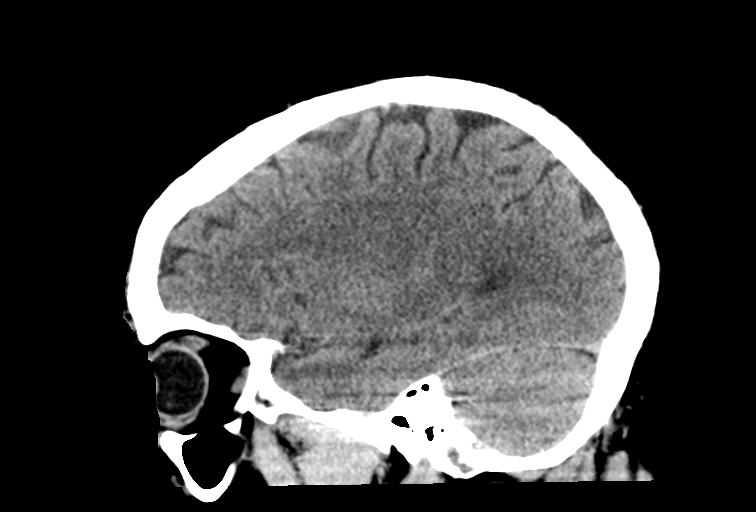

[15 of 47 positions shown; findings below may reference images not displayed]

FINDINGS: Brain: No evidence of acute infarction, hemorrhage, hydrocephalus,
extra-axial collection or mass lesion/mass effect.

Vascular: No hyperdense vessel or unexpected calcification.

Skull: Normal. Negative for fracture or focal lesion.

Sinuses/Orbits: No acute finding.

Other: None.
IMPRESSION: No acute intracranial process.

## 2021-04-30 ENCOUNTER — Encounter (HOSPITAL_COMMUNITY): Payer: Self-pay | Admitting: Emergency Medicine

## 2021-04-30 ENCOUNTER — Emergency Department (HOSPITAL_COMMUNITY): Payer: Medicare Other

## 2021-04-30 ENCOUNTER — Inpatient Hospital Stay (HOSPITAL_COMMUNITY)
Admission: EM | Admit: 2021-04-30 | Discharge: 2021-05-04 | DRG: 247 | Disposition: A | Discharge status: Home / Self Care | Payer: Medicare Other | Attending: Internal Medicine | Admitting: Internal Medicine

## 2021-04-30 DIAGNOSIS — I10 Essential (primary) hypertension: Secondary | ICD-10-CM | POA: Diagnosis present

## 2021-04-30 DIAGNOSIS — Z83438 Family history of other disorder of lipoprotein metabolism and other lipidemia: Secondary | ICD-10-CM

## 2021-04-30 DIAGNOSIS — I77810 Thoracic aortic ectasia: Secondary | ICD-10-CM | POA: Diagnosis present

## 2021-04-30 DIAGNOSIS — F17200 Nicotine dependence, unspecified, uncomplicated: Secondary | ICD-10-CM | POA: Diagnosis present

## 2021-04-30 DIAGNOSIS — M545 Low back pain, unspecified: Secondary | ICD-10-CM | POA: Diagnosis present

## 2021-04-30 DIAGNOSIS — Z880 Allergy status to penicillin: Secondary | ICD-10-CM

## 2021-04-30 DIAGNOSIS — G8929 Other chronic pain: Secondary | ICD-10-CM | POA: Diagnosis present

## 2021-04-30 DIAGNOSIS — R7303 Prediabetes: Secondary | ICD-10-CM

## 2021-04-30 DIAGNOSIS — Z955 Presence of coronary angioplasty implant and graft: Secondary | ICD-10-CM

## 2021-04-30 DIAGNOSIS — Z87442 Personal history of urinary calculi: Secondary | ICD-10-CM

## 2021-04-30 DIAGNOSIS — F1410 Cocaine abuse, uncomplicated: Secondary | ICD-10-CM | POA: Diagnosis present

## 2021-04-30 DIAGNOSIS — Z79899 Other long term (current) drug therapy: Secondary | ICD-10-CM

## 2021-04-30 DIAGNOSIS — G90522 Complex regional pain syndrome I of left lower limb: Secondary | ICD-10-CM | POA: Diagnosis present

## 2021-04-30 DIAGNOSIS — I129 Hypertensive chronic kidney disease with stage 1 through stage 4 chronic kidney disease, or unspecified chronic kidney disease: Secondary | ICD-10-CM | POA: Diagnosis present

## 2021-04-30 DIAGNOSIS — Z981 Arthrodesis status: Secondary | ICD-10-CM

## 2021-04-30 DIAGNOSIS — Z6833 Body mass index (BMI) 33.0-33.9, adult: Secondary | ICD-10-CM

## 2021-04-30 DIAGNOSIS — K219 Gastro-esophageal reflux disease without esophagitis: Secondary | ICD-10-CM | POA: Diagnosis present

## 2021-04-30 DIAGNOSIS — Z8614 Personal history of Methicillin resistant Staphylococcus aureus infection: Secondary | ICD-10-CM

## 2021-04-30 DIAGNOSIS — R7989 Other specified abnormal findings of blood chemistry: Secondary | ICD-10-CM

## 2021-04-30 DIAGNOSIS — Z87892 Personal history of anaphylaxis: Secondary | ICD-10-CM

## 2021-04-30 DIAGNOSIS — F1011 Alcohol abuse, in remission: Secondary | ICD-10-CM | POA: Diagnosis present

## 2021-04-30 DIAGNOSIS — G4733 Obstructive sleep apnea (adult) (pediatric): Secondary | ICD-10-CM | POA: Diagnosis present

## 2021-04-30 DIAGNOSIS — Z8249 Family history of ischemic heart disease and other diseases of the circulatory system: Secondary | ICD-10-CM

## 2021-04-30 DIAGNOSIS — E669 Obesity, unspecified: Secondary | ICD-10-CM | POA: Diagnosis present

## 2021-04-30 DIAGNOSIS — I214 Non-ST elevation (NSTEMI) myocardial infarction: Secondary | ICD-10-CM | POA: Diagnosis not present

## 2021-04-30 DIAGNOSIS — Z720 Tobacco use: Secondary | ICD-10-CM | POA: Diagnosis present

## 2021-04-30 DIAGNOSIS — F17210 Nicotine dependence, cigarettes, uncomplicated: Secondary | ICD-10-CM | POA: Diagnosis present

## 2021-04-30 DIAGNOSIS — R079 Chest pain, unspecified: Secondary | ICD-10-CM

## 2021-04-30 DIAGNOSIS — E785 Hyperlipidemia, unspecified: Secondary | ICD-10-CM | POA: Diagnosis present

## 2021-04-30 DIAGNOSIS — Z91041 Radiographic dye allergy status: Secondary | ICD-10-CM

## 2021-04-30 DIAGNOSIS — N1831 Chronic kidney disease, stage 3a: Secondary | ICD-10-CM

## 2021-04-30 DIAGNOSIS — Z813 Family history of other psychoactive substance abuse and dependence: Secondary | ICD-10-CM

## 2021-04-30 DIAGNOSIS — Z91030 Bee allergy status: Secondary | ICD-10-CM

## 2021-04-30 DIAGNOSIS — F331 Major depressive disorder, recurrent, moderate: Secondary | ICD-10-CM | POA: Diagnosis present

## 2021-04-30 DIAGNOSIS — Z91040 Latex allergy status: Secondary | ICD-10-CM

## 2021-04-30 DIAGNOSIS — Z20822 Contact with and (suspected) exposure to covid-19: Secondary | ICD-10-CM | POA: Diagnosis present

## 2021-04-30 DIAGNOSIS — Z91013 Allergy to seafood: Secondary | ICD-10-CM

## 2021-04-30 HISTORY — DX: Chronic kidney disease, stage 3a: N18.31

## 2021-04-30 HISTORY — DX: Thoracic aortic ectasia: I77.810

## 2021-04-30 LAB — CBC
HCT: 49 % (ref 39.0–52.0)
Hemoglobin: 16 g/dL (ref 13.0–17.0)
MCH: 29.9 pg (ref 26.0–34.0)
MCHC: 32.7 g/dL (ref 30.0–36.0)
MCV: 91.4 fL (ref 80.0–100.0)
Platelets: 218 10*3/uL (ref 150–400)
RBC: 5.36 MIL/uL (ref 4.22–5.81)
RDW: 13 % (ref 11.5–15.5)
WBC: 11.3 10*3/uL — ABNORMAL HIGH (ref 4.0–10.5)
nRBC: 0 % (ref 0.0–0.2)

## 2021-04-30 LAB — HEPARIN LEVEL (UNFRACTIONATED): Heparin Unfractionated: 0.13 IU/mL — ABNORMAL LOW (ref 0.30–0.70)

## 2021-04-30 LAB — BASIC METABOLIC PANEL
Anion gap: 8 (ref 5–15)
BUN: 20 mg/dL (ref 6–20)
CO2: 25 mmol/L (ref 22–32)
Calcium: 8.9 mg/dL (ref 8.9–10.3)
Chloride: 107 mmol/L (ref 98–111)
Creatinine, Ser: 1.26 mg/dL — ABNORMAL HIGH (ref 0.61–1.24)
GFR, Estimated: >60 mL/min (ref >60)
Glucose, Bld: 121 mg/dL — ABNORMAL HIGH (ref 70–99)
Potassium: 3.5 mmol/L (ref 3.5–5.1)
Sodium: 140 mmol/L (ref 135–145)

## 2021-04-30 LAB — HEPATIC FUNCTION PANEL
ALT: 26 U/L (ref 0–44)
AST: 21 U/L (ref 15–41)
Albumin: 3.7 g/dL (ref 3.5–5.0)
Alkaline Phosphatase: 107 U/L (ref 38–126)
Bilirubin, Direct: <0.1 mg/dL (ref 0.0–0.2)
Total Bilirubin: 0.4 mg/dL (ref 0.3–1.2)
Total Protein: 6.6 g/dL (ref 6.5–8.1)

## 2021-04-30 LAB — TROPONIN I (HIGH SENSITIVITY)
Troponin I (High Sensitivity): 210 ng/L (ref <18)
Troponin I (High Sensitivity): 399 ng/L (ref <18)

## 2021-04-30 LAB — RESP PANEL BY RT-PCR (FLU A&B, COVID) ARPGX2
Influenza A by PCR: NEGATIVE
Influenza B by PCR: NEGATIVE
SARS Coronavirus 2 by RT PCR: NEGATIVE

## 2021-04-30 MED ORDER — NICOTINE 21 MG/24HR TD PT24
21.0000 mg | MEDICATED_PATCH | Freq: Every day | TRANSDERMAL | Status: DC
  Administered 2021-05-01 – 2021-05-04 (×4): 21 mg via TRANSDERMAL
  Filled 2021-04-30 (×4): qty 1

## 2021-04-30 MED ORDER — AMLODIPINE BESYLATE 10 MG PO TABS
10.0000 mg | ORAL_TABLET | Freq: Every day | ORAL | Status: DC
  Administered 2021-05-01 – 2021-05-04 (×4): 10 mg via ORAL
  Filled 2021-04-30 (×4): qty 1

## 2021-04-30 MED ORDER — HEPARIN BOLUS VIA INFUSION
3000.0000 [IU] | Freq: Once | INTRAVENOUS | Status: AC
  Administered 2021-04-30: 3000 [IU] via INTRAVENOUS
  Filled 2021-04-30: qty 3000

## 2021-04-30 MED ORDER — ALBUTEROL SULFATE HFA 108 (90 BASE) MCG/ACT IN AERS
4.0000 | INHALATION_SPRAY | Freq: Once | RESPIRATORY_TRACT | Status: AC
  Administered 2021-04-30: 4 via RESPIRATORY_TRACT
  Filled 2021-04-30: qty 6.7

## 2021-04-30 MED ORDER — HEPARIN (PORCINE) 25000 UT/250ML-% IV SOLN
1750.0000 [IU]/h | INTRAVENOUS | Status: DC
  Administered 2021-04-30: 1200 [IU]/h via INTRAVENOUS
  Administered 2021-05-01: 1500 [IU]/h via INTRAVENOUS
  Administered 2021-05-02 (×2): 1550 [IU]/h via INTRAVENOUS
  Administered 2021-05-03: 1750 [IU]/h via INTRAVENOUS
  Filled 2021-04-30 (×6): qty 250

## 2021-04-30 MED ORDER — SODIUM CHLORIDE 0.9% FLUSH
3.0000 mL | INTRAVENOUS | Status: DC | PRN
  Administered 2021-05-03: 3 mL via INTRAVENOUS

## 2021-04-30 MED ORDER — ONDANSETRON HCL 4 MG/2ML IJ SOLN
4.0000 mg | Freq: Four times a day (QID) | INTRAMUSCULAR | Status: DC | PRN
  Administered 2021-05-01 – 2021-05-03 (×4): 4 mg via INTRAVENOUS
  Filled 2021-04-30 (×4): qty 2

## 2021-04-30 MED ORDER — NITROGLYCERIN 0.4 MG SL SUBL
0.4000 mg | SUBLINGUAL_TABLET | SUBLINGUAL | Status: DC | PRN
  Administered 2021-05-01 (×5): 0.4 mg via SUBLINGUAL
  Filled 2021-04-30 (×3): qty 1

## 2021-04-30 MED ORDER — ASPIRIN EC 81 MG PO TBEC
81.0000 mg | DELAYED_RELEASE_TABLET | Freq: Every day | ORAL | Status: DC
  Administered 2021-05-01 – 2021-05-04 (×4): 81 mg via ORAL
  Filled 2021-04-30 (×4): qty 1

## 2021-04-30 MED ORDER — ATORVASTATIN CALCIUM 80 MG PO TABS
80.0000 mg | ORAL_TABLET | Freq: Every day | ORAL | Status: DC
  Administered 2021-04-30 – 2021-05-04 (×5): 80 mg via ORAL
  Filled 2021-04-30 (×6): qty 1

## 2021-04-30 MED ORDER — SODIUM CHLORIDE 0.9% FLUSH
3.0000 mL | Freq: Two times a day (BID) | INTRAVENOUS | Status: DC
  Administered 2021-04-30 – 2021-05-03 (×4): 3 mL via INTRAVENOUS

## 2021-04-30 MED ORDER — ISOSORBIDE MONONITRATE ER 30 MG PO TB24
30.0000 mg | ORAL_TABLET | Freq: Every day | ORAL | Status: DC
  Administered 2021-04-30 – 2021-05-04 (×5): 30 mg via ORAL
  Filled 2021-04-30 (×5): qty 1

## 2021-04-30 MED ORDER — SODIUM CHLORIDE 0.9 % IV SOLN
250.0000 mL | INTRAVENOUS | Status: DC | PRN
  Administered 2021-05-04: 250 mL via INTRAVENOUS

## 2021-04-30 MED ORDER — ACETAMINOPHEN 325 MG PO TABS: 650.0000 mg | ORAL_TABLET | ORAL | Status: DC | PRN

## 2021-04-30 MED ORDER — HEPARIN BOLUS VIA INFUSION
4000.0000 [IU] | Freq: Once | INTRAVENOUS | Status: AC
  Administered 2021-04-30: 4000 [IU] via INTRAVENOUS
  Filled 2021-04-30: qty 4000

## 2021-04-30 MED ORDER — SERTRALINE HCL 50 MG PO TABS
150.0000 mg | ORAL_TABLET | Freq: Every day | ORAL | Status: DC
  Administered 2021-05-01 – 2021-05-04 (×4): 150 mg via ORAL
  Filled 2021-04-30 (×5): qty 1

## 2021-04-30 NOTE — ED Provider Notes (Signed)
Emergency Medicine Provider Triage Evaluation Note  [name] , a 55 y.o. male  was evaluated in triage.  Pt complains of cp.  Review of Systems  Positive: Cp, R shoulder pain, sob, lighthead, nausea, jaw pain Negative: Fever, chills, cough, abd pain  Physical Exam  BP 120/86 (BP Location: Left Arm)   Pulse 90   Temp 98.6 F (37 C) (Oral)   Resp 20   Ht 5\' 10"  (1.778 m)   Wt 104.3 kg   SpO2 94%   BMI 33.00 kg/m  Gen:   Awake, no distress   Resp:  Normal effort  MSK:   Moves extremities without difficulty  Other:    Medical Decision Making  Medically screening exam initiated at 1:43 PM.  Appropriate orders placed.  [name] was informed that the remainder of the evaluation will be completed by another provider, this initial triage assessment does not replace that evaluation, and the importance of remaining in the ED until their evaluation is complete.  Pt developed central cp radiates to R shoulder with lightheadedness, nausea, jaw pain, and sob.  Sxs resolved after ASA and SL nitro.  Currently cp free.  Family hx of cardiac disease.  Pt smokes 1pack/day.  Denies alcohol abuse.    Domenic Moras, PA-C 04/30/21 1347    Elnora Morrison, MD 05/05/21 1343
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
## 2021-04-30 NOTE — Progress Notes (Signed)
ANTICOAGULATION CONSULT NOTE - Initial Consult  Pharmacy Consult for heparin Indication: chest pain/ACS  Allergies  Allergen Reactions   Bee Venom Swelling    Severe swelling   Fish Allergy Anaphylaxis    Throat swelling with seafood   Iodinated Diagnostic Agents Anaphylaxis   Latex Anaphylaxis   Penicillins Anaphylaxis    Has patient had a PCN reaction causing immediate rash, facial/tongue/throat swelling, SOB or lightheadedness with hypotension: Yes Has patient had a PCN reaction causing severe rash involving mucus membranes or skin necrosis: No Has patient had a PCN reaction that required hospitalization No Has patient had a PCN reaction occurring within the last 10 years: No If all of the above answers are "NO", then may proceed with Cephalosporin use.     Patient Measurements: Height: 5\' 10"  (177.8 cm) Weight: 104.3 kg (230 lb) IBW/kg (Calculated) : 73 Heparin Dosing Weight: 95.2kg  Vital Signs: Temp: 98.6 F (37 C) (10/14 1535) Temp Source: Oral (10/14 1326) BP: 136/80 (10/14 1530) Pulse Rate: 72 (10/14 1530)  Labs: Recent Labs    04/30/21 1432  HGB 16.0  HCT 49.0  PLT 218  CREATININE 1.26*  TROPONINIHS 210*    Estimated Creatinine Clearance: 80.1 mL/min (A) (by C-G formula based on SCr of 1.26 mg/dL (H)).   Medical History: Past Medical History:  Diagnosis Date   Acute kidney failure ([place])    after last surgery   Alcohol abuse, in remission 11/03/2015   Chronic lower back pain    Cocaine abuse ([place]) 11/03/2015   Depression    takes Prozac daily   GERD (gastroesophageal reflux disease)    occasionally and will take Rolaid if needed   Hemorrhoids    History of kidney stones    History of MRSA infection    several yrs ago   Hypertension    takes Lisinopril-HCTZ and AMlodipine daily   OSA on CPAP    study was done 3 yrs ago.  Wears a cpap   Polysubstance dependence, non-opioid, episodic (HCC)    RSD (reflex sympathetic dystrophy), L foot due to  calcaneus fx 08/14/2012   Assessment: 70 YOM presenting with CP, elevated troponin, he is not on anticoagulation PTA.  CBC wnl  Goal of Therapy:  Heparin level 0.3-0.7 units/ml Monitor platelets by anticoagulation protocol: Yes   Plan:  Heparin 4000 units IV x 1, and gtt at 1200 units/hr F/u 6 hour heparin level F/u cards eval and recs  Bertis Ruddy, PharmD Clinical Pharmacist ED Pharmacist Phone # [phone] 04/30/2021 4:52 PM

## 2021-04-30 NOTE — Progress Notes (Signed)
[place] for heparin Indication: chest pain/ACS  Allergies  Allergen Reactions   Bee Venom Swelling    Severe swelling   Fish Allergy Anaphylaxis    Throat swelling with seafood   Iodinated Diagnostic Agents Anaphylaxis   Latex Anaphylaxis   Penicillins Anaphylaxis    Has patient had a PCN reaction causing immediate rash, facial/tongue/throat swelling, SOB or lightheadedness with hypotension: Yes Has patient had a PCN reaction causing severe rash involving mucus membranes or skin necrosis: No Has patient had a PCN reaction that required hospitalization No Has patient had a PCN reaction occurring within the last 10 years: No If all of the above answers are "NO", then may proceed with Cephalosporin use.     Patient Measurements: Height: 5\' 10"  (177.8 cm) Weight: 106.3 kg (234 lb 6.4 oz) IBW/kg (Calculated) : 73 Heparin Dosing Weight: 95.2kg  Vital Signs: Temp: 97.8 F (36.6 C) (10/14 2050) Temp Source: Oral (10/14 2050) BP: 132/79 (10/14 2050) Pulse Rate: 72 (10/14 1840)  Labs: Recent Labs    04/30/21 1432 04/30/21 1849 04/30/21 2223  HGB 16.0  --   --   HCT 49.0  --   --   PLT 218  --   --   HEPARINUNFRC  --   --  0.13*  CREATININE 1.26*  --   --   TROPONINIHS 210* 399*  --      Estimated Creatinine Clearance: 80.9 mL/min (A) (by C-G formula based on SCr of 1.26 mg/dL (H)).   Medical History: Past Medical History:  Diagnosis Date   Acute kidney failure ([place])    after last surgery   Alcohol abuse, in remission 11/03/2015   Aortic root dilation (HCC)    Chronic kidney disease, stage 3a (HCC)    Chronic lower back pain    Cocaine abuse ([place]) 11/03/2015   Depression    GERD (gastroesophageal reflux disease)    occasionally and will take Rolaid if needed   Hemorrhoids    History of kidney stones    History of MRSA infection    several yrs ago   Hypertension    OSA on CPAP    study was done 3 yrs ago.  Wears a cpap    Polysubstance dependence, non-opioid, episodic (HCC)    RSD (reflex sympathetic dystrophy), L foot due to calcaneus fx 08/14/2012   Assessment: 25 YOM presenting with CP, elevated troponin, he is not on anticoagulation PTA.  CBC wnl.   Heparin level subtherapeutic (0.13) on gtt at 1200 units/hr. No issues with line or bleeding reported per RN.  Goal of Therapy:  Heparin level 0.3-0.7 units/ml Monitor platelets by anticoagulation protocol: Yes   Plan:  Rebolus heparin 3000 units Increase heparin gtt to 1500 units/hr F/u 6 hour heparin level  Sherlon Handing, PharmD, BCPS Please see amion for complete clinical pharmacist phone list 04/30/2021 11:01 PM

## 2021-04-30 NOTE — Progress Notes (Signed)
Pt refusing cpap for the night. ?

## 2021-04-30 NOTE — ED Notes (Signed)
Pt given urinal and encouraged to provide a urine sample as soon as he is able
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
## 2021-04-30 NOTE — ED Triage Notes (Signed)
Pt arrives via EMS from home with not feeling well all morning. Central sudden chest pain that went to right shoulder. EMS gave 324 ASA and 2 nitro. BP elevated for EMS before nitro. Family hx of MI.
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
## 2021-05-01 ENCOUNTER — Encounter (HOSPITAL_COMMUNITY): Payer: Self-pay | Admitting: Internal Medicine

## 2021-05-01 ENCOUNTER — Other Ambulatory Visit (HOSPITAL_COMMUNITY): Payer: Medicaid Other

## 2021-05-01 ENCOUNTER — Other Ambulatory Visit: Payer: Self-pay

## 2021-05-01 ENCOUNTER — Inpatient Hospital Stay (HOSPITAL_COMMUNITY): Payer: Medicare Other

## 2021-05-01 DIAGNOSIS — F1011 Alcohol abuse, in remission: Secondary | ICD-10-CM | POA: Diagnosis present

## 2021-05-01 DIAGNOSIS — Z20822 Contact with and (suspected) exposure to covid-19: Secondary | ICD-10-CM | POA: Diagnosis present

## 2021-05-01 DIAGNOSIS — G8929 Other chronic pain: Secondary | ICD-10-CM | POA: Diagnosis present

## 2021-05-01 DIAGNOSIS — F1410 Cocaine abuse, uncomplicated: Secondary | ICD-10-CM | POA: Diagnosis present

## 2021-05-01 DIAGNOSIS — I129 Hypertensive chronic kidney disease with stage 1 through stage 4 chronic kidney disease, or unspecified chronic kidney disease: Secondary | ICD-10-CM | POA: Diagnosis present

## 2021-05-01 DIAGNOSIS — Z87442 Personal history of urinary calculi: Secondary | ICD-10-CM | POA: Diagnosis not present

## 2021-05-01 DIAGNOSIS — I214 Non-ST elevation (NSTEMI) myocardial infarction: Secondary | ICD-10-CM

## 2021-05-01 DIAGNOSIS — N1831 Chronic kidney disease, stage 3a: Secondary | ICD-10-CM | POA: Diagnosis present

## 2021-05-01 DIAGNOSIS — E669 Obesity, unspecified: Secondary | ICD-10-CM | POA: Diagnosis present

## 2021-05-01 DIAGNOSIS — Z87892 Personal history of anaphylaxis: Secondary | ICD-10-CM | POA: Diagnosis not present

## 2021-05-01 DIAGNOSIS — F331 Major depressive disorder, recurrent, moderate: Secondary | ICD-10-CM | POA: Diagnosis present

## 2021-05-01 DIAGNOSIS — G90522 Complex regional pain syndrome I of left lower limb: Secondary | ICD-10-CM | POA: Diagnosis present

## 2021-05-01 DIAGNOSIS — M545 Low back pain, unspecified: Secondary | ICD-10-CM | POA: Diagnosis present

## 2021-05-01 DIAGNOSIS — E785 Hyperlipidemia, unspecified: Secondary | ICD-10-CM | POA: Diagnosis present

## 2021-05-01 DIAGNOSIS — R7303 Prediabetes: Secondary | ICD-10-CM

## 2021-05-01 DIAGNOSIS — I77810 Thoracic aortic ectasia: Secondary | ICD-10-CM | POA: Diagnosis present

## 2021-05-01 DIAGNOSIS — Z6833 Body mass index (BMI) 33.0-33.9, adult: Secondary | ICD-10-CM | POA: Diagnosis not present

## 2021-05-01 DIAGNOSIS — K219 Gastro-esophageal reflux disease without esophagitis: Secondary | ICD-10-CM | POA: Diagnosis present

## 2021-05-01 DIAGNOSIS — I10 Essential (primary) hypertension: Secondary | ICD-10-CM

## 2021-05-01 DIAGNOSIS — G4733 Obstructive sleep apnea (adult) (pediatric): Secondary | ICD-10-CM | POA: Diagnosis present

## 2021-05-01 DIAGNOSIS — Z79899 Other long term (current) drug therapy: Secondary | ICD-10-CM | POA: Diagnosis not present

## 2021-05-01 DIAGNOSIS — F17210 Nicotine dependence, cigarettes, uncomplicated: Secondary | ICD-10-CM | POA: Diagnosis present

## 2021-05-01 DIAGNOSIS — F17200 Nicotine dependence, unspecified, uncomplicated: Secondary | ICD-10-CM

## 2021-05-01 DIAGNOSIS — Z8614 Personal history of Methicillin resistant Staphylococcus aureus infection: Secondary | ICD-10-CM | POA: Diagnosis not present

## 2021-05-01 DIAGNOSIS — Z981 Arthrodesis status: Secondary | ICD-10-CM | POA: Diagnosis not present

## 2021-05-01 DIAGNOSIS — Z91030 Bee allergy status: Secondary | ICD-10-CM | POA: Diagnosis not present

## 2021-05-01 LAB — BASIC METABOLIC PANEL
Anion gap: 7 (ref 5–15)
BUN: 18 mg/dL (ref 6–20)
CO2: 24 mmol/L (ref 22–32)
Calcium: 8.3 mg/dL — ABNORMAL LOW (ref 8.9–10.3)
Chloride: 106 mmol/L (ref 98–111)
Creatinine, Ser: 1.14 mg/dL (ref 0.61–1.24)
GFR, Estimated: >60 mL/min (ref >60)
Glucose, Bld: 101 mg/dL — ABNORMAL HIGH (ref 70–99)
Potassium: 4.1 mmol/L (ref 3.5–5.1)
Sodium: 137 mmol/L (ref 135–145)

## 2021-05-01 LAB — ECHOCARDIOGRAM COMPLETE
AR max vel: 2.88 cm2
AV Area VTI: 3.02 cm2
AV Area mean vel: 2.83 cm2
AV Mean grad: 8 mmHg
AV Peak grad: 14.3 mmHg
Ao pk vel: 1.89 m/s
Area-P 1/2: 3.3 cm2
Height: 70 in
S' Lateral: 3.3 cm
Weight: 3750.4 oz

## 2021-05-01 LAB — RAPID URINE DRUG SCREEN, HOSP PERFORMED
Amphetamines: NOT DETECTED
Barbiturates: NOT DETECTED
Benzodiazepines: NOT DETECTED
Cocaine: POSITIVE — AB
Opiates: NOT DETECTED
Tetrahydrocannabinol: NOT DETECTED

## 2021-05-01 LAB — LIPID PANEL
Cholesterol: 184 mg/dL (ref 0–200)
HDL: 39 mg/dL — ABNORMAL LOW (ref >40)
LDL Cholesterol: 129 mg/dL — ABNORMAL HIGH (ref 0–99)
Total CHOL/HDL Ratio: 4.7 RATIO
Triglycerides: 79 mg/dL (ref <150)
VLDL: 16 mg/dL (ref 0–40)

## 2021-05-01 LAB — CBC
HCT: 43.3 % (ref 39.0–52.0)
Hemoglobin: 14.6 g/dL (ref 13.0–17.0)
MCH: 30.3 pg (ref 26.0–34.0)
MCHC: 33.7 g/dL (ref 30.0–36.0)
MCV: 89.8 fL (ref 80.0–100.0)
Platelets: 171 10*3/uL (ref 150–400)
RBC: 4.82 MIL/uL (ref 4.22–5.81)
RDW: 12.9 % (ref 11.5–15.5)
WBC: 7.8 10*3/uL (ref 4.0–10.5)
nRBC: 0 % (ref 0.0–0.2)

## 2021-05-01 LAB — HEPARIN LEVEL (UNFRACTIONATED)
Heparin Unfractionated: 0.42 IU/mL (ref 0.30–0.70)
Heparin Unfractionated: 0.31 IU/mL (ref 0.30–0.70)

## 2021-05-01 LAB — TROPONIN I (HIGH SENSITIVITY)
Troponin I (High Sensitivity): 358 ng/L (ref <18)
Troponin I (High Sensitivity): 212 ng/L (ref <18)

## 2021-05-01 MED ORDER — INFLUENZA VAC SPLIT QUAD 0.5 ML IM SUSY: 0.5000 mL | PREFILLED_SYRINGE | INTRAMUSCULAR | Status: DC | PRN

## 2021-05-01 NOTE — Progress Notes (Signed)
PROGRESS NOTE  [name] XNT:[id number] DOB: [birth year] DOA: 04/30/2021 PCP: Owens Loffler, MD  HPI/Recap of past 24 hours: [name] is a 55 y.o. male with medical history significant of depression, HTN, cocaine abuse, tobacco abuse, CKD stage IIIa, aortic root dilation, and OSA on cpap who presented to ED with chest pain. He states the pain started the night PTA. Pain was substernal and rated as a 10/10 and described as sharp, lasts 10-15 mins and has been intermittent for the past week. Radiated to his right shoulder and bilateral jaws, has associated shortness of breath. 911 was called by his mother and EMS gave him NG and it resolved his pain. His pain started to come back and he was given another NG on way to hospital. Family history of CAD in his father. Prediabetic and hx of HTN. Smokes 1ppd. Last used cocaine 9 days ago. In the ED VS stable, labs with WBC: 11.3, creatinine 1.26, initial troponin 210, CXR with no acute findings. Patient was started on heparin drip. Cardiology consulted.TRH was asked to admit.    Today, pt still with intermittent chest pain, nausea and feeling dizzy. Also reports SOB.   Assessment/Plan: Principal Problem:   NSTEMI (non-ST elevated myocardial infarction) ([place]) Active Problems:   Major depressive disorder, recurrent episode, moderate (HCC)   HTN (hypertension), benign   Tobacco use disorder   Cocaine abuse ([place])   Prediabetes   Stage 3a chronic kidney disease (CKD) ([place])   NSTEMI Continues to have intermittent CP requiring NG Troponin 210-->399-->358 EKG with no acute ST changes CXR unremarkable Echo done showed EF of 60-65%, no RWMA Cardiology consulted, plan for cardiac cath on 05/03/21 Continue Heparin drip, aspirin, statin, NG prn Avoid beta-blockers in setting of ongoing cocaine abuse Telemetry, monitor closely  HTN Continue home Norvasc, Imdur added by cardiology  HLD LDL 129 Continue Lipitor  Pre-diabetes   Recent A1c  6.1 on 8/22  Diet modification  Stage 3a chronic kidney disease  Cr at baseline Avoid nephrotoxic drugs  Aortic root dilation Minimally dilated aortic root at 38 mm on last echo in November 2020 Repeat echo with normal aortic root   OSA Continue cpap at night   Polysubstance abuse Tobacco and cocaine abuse UDS pending collection Avoid BB Encourage cessation Nicotine patch   Major depressive disorder, recurrent episode, moderate (HCC) Followed by psychiatry outpatient Difficult to control depression and SI ideation in the past No SI/HI Continue Zoloft 150 mg daily   Obesity Body mass index is 33.63 kg/m. Estimated body mass index is 33.63 kg/m as calculated from the following:   Height as of this encounter: 5\' 10"  (1.778 m).   Weight as of this encounter: 106.3 kg.       Code Status: Full  Family Communication: Mother at bedside  Disposition Plan: Status is: Inpatient  Remains inpatient appropriate because: Level of care    Consultants: Cardiology  Procedures: None  Antimicrobials: None  DVT prophylaxis: Heparin drip    Objective: Vitals:   05/01/21 0930 05/01/21 0935 05/01/21 0940 05/01/21 1408  BP: 130/78 119/80 124/80 (!) 144/76  Pulse:    80  Resp:    20  Temp:    98.3 F (36.8 C)  TempSrc:    Oral  SpO2:    97%  Weight:      Height:        Intake/Output Summary (Last 24 hours) at 05/01/2021 1620 Last data filed at 05/01/2021 0600 Gross per 24 hour  Intake 190.68 ml  Output --  Net 190.68 ml   Filed Weights   04/30/21 1332 04/30/21 1840  Weight: 104.3 kg 106.3 kg    Exam: General: NAD, somewhat anxious Cardiovascular: S1, S2 present Respiratory: CTAB Abdomen: Soft, nontender, nondistended, bowel sounds present Musculoskeletal: No bilateral pedal edema noted Skin: Normal Psychiatry: Fair mood     Data Reviewed: CBC: Recent Labs  Lab 04/30/21 1432 05/01/21 0627  WBC 11.3* 7.8  HGB 16.0 14.6  HCT 49.0 43.3   MCV 91.4 89.8  PLT 218 542   Basic Metabolic Panel: Recent Labs  Lab 04/30/21 1432 05/01/21 0627  NA 140 137  K 3.5 4.1  CL 107 106  CO2 25 24  GLUCOSE 121* 101*  BUN 20 18  CREATININE 1.26* 1.14  CALCIUM 8.9 8.3*   GFR: Estimated Creatinine Clearance: 89.4 mL/min (by C-G formula based on SCr of 1.14 mg/dL). Liver Function Tests: Recent Labs  Lab 04/30/21 1849  AST 21  ALT 26  ALKPHOS 107  BILITOT 0.4  PROT 6.6  ALBUMIN 3.7   No results for input(s): LIPASE, AMYLASE in the last 168 hours. No results for input(s): AMMONIA in the last 168 hours. Coagulation Profile: No results for input(s): INR, PROTIME in the last 168 hours. Cardiac Enzymes: No results for input(s): CKTOTAL, CKMB, CKMBINDEX, TROPONINI in the last 168 hours. BNP (last 3 results) No results for input(s): PROBNP in the last 8760 hours. HbA1C: No results for input(s): HGBA1C in the last 72 hours. CBG: No results for input(s): GLUCAP in the last 168 hours. Lipid Profile: Recent Labs    05/01/21 0627  CHOL 184  HDL 39*  LDLCALC 129*  TRIG 79  CHOLHDL 4.7   Thyroid Function Tests: No results for input(s): TSH, T4TOTAL, FREET4, T3FREE, THYROIDAB in the last 72 hours. Anemia Panel: No results for input(s): VITAMINB12, FOLATE, FERRITIN, TIBC, IRON, RETICCTPCT in the last 72 hours. Urine analysis:    Component Value Date/Time   COLORURINE YELLOW 04/26/2019 1344   APPEARANCEUR CLEAR 04/26/2019 1344   LABSPEC 1.021 04/26/2019 1344   PHURINE 5.0 04/26/2019 1344   GLUCOSEU NEGATIVE 04/26/2019 1344   HGBUR NEGATIVE 04/26/2019 1344   BILIRUBINUR NEGATIVE 04/26/2019 1344   BILIRUBINUR negative 06/21/2013 [place] 04/26/2019 1344   PROTEINUR NEGATIVE 04/26/2019 1344   UROBILINOGEN 0.2 09/21/2014 0849   NITRITE NEGATIVE 04/26/2019 1344   LEUKOCYTESUR NEGATIVE 04/26/2019 1344   Sepsis Labs: @LABRCNTIP (procalcitonin:4,lacticidven:4)  ) Recent Results (from the past 240 hour(s))   Resp Panel by RT-PCR (Flu A&B, Covid) Nasopharyngeal Swab     Status: None   Collection Time: 04/30/21  6:10 PM   Specimen: Nasopharyngeal Swab; Nasopharyngeal(NP) swabs in vial transport medium  Result Value Ref Range Status   SARS Coronavirus 2 by RT PCR NEGATIVE NEGATIVE Final    Comment: (NOTE) SARS-CoV-2 target nucleic acids are NOT DETECTED.  The SARS-CoV-2 RNA is generally detectable in upper respiratory specimens during the acute phase of infection. The lowest concentration of SARS-CoV-2 viral copies this assay can detect is 138 copies/mL. A negative result does not preclude SARS-Cov-2 infection and should not be used as the sole basis for treatment or other patient management decisions. A negative result may occur with  improper specimen collection/handling, submission of specimen other than nasopharyngeal swab, presence of viral mutation(s) within the areas targeted by this assay, and inadequate number of viral copies(<138 copies/mL). A negative result must be combined with clinical observations, patient history, and epidemiological information. The  expected result is Negative.  Fact Sheet for Patients:  EntrepreneurPulse.com.au  Fact Sheet for Healthcare Providers:  IncredibleEmployment.be  This test is no t yet approved or cleared by the [place] FDA and  has been authorized for detection and/or diagnosis of SARS-CoV-2 by FDA under an Emergency Use Authorization (EUA). This EUA will remain  in effect (meaning this test can be used) for the duration of the COVID-19 declaration under Section 564(b)(1) of the Act, 21 U.S.C.section 360bbb-3(b)(1), unless the authorization is terminated  or revoked sooner.       Influenza A by PCR NEGATIVE NEGATIVE Final   Influenza B by PCR NEGATIVE NEGATIVE Final    Comment: (NOTE) The Xpert Xpress SARS-CoV-2/FLU/RSV plus assay is intended as an aid in the diagnosis of influenza from  Nasopharyngeal swab specimens and should not be used as a sole basis for treatment. Nasal washings and aspirates are unacceptable for Xpert Xpress SARS-CoV-2/FLU/RSV testing.  Fact Sheet for Patients: EntrepreneurPulse.com.au  Fact Sheet for Healthcare Providers: IncredibleEmployment.be  This test is not yet approved or cleared by the [place] FDA and has been authorized for detection and/or diagnosis of SARS-CoV-2 by FDA under an Emergency Use Authorization (EUA). This EUA will remain in effect (meaning this test can be used) for the duration of the COVID-19 declaration under Section 564(b)(1) of the Act, 21 U.S.C. section 360bbb-3(b)(1), unless the authorization is terminated or revoked.  Performed at [facility], [place] [address], [place], [place] [address]       Studies: ECHOCARDIOGRAM COMPLETE  Result Date: 05/01/2021    ECHOCARDIOGRAM REPORT   Patient Name:   [name] Date of Exam: 05/01/2021 Medical Rec #:  [id number]    Height:       70.0 in Accession #:    [id number]   Weight:       234.4 lb Date of Birth:  [birth year]     BSA:          2.233 m Patient Age:    16 years     BP:           132/83 mmHg Patient Gender: M            HR:           73 bpm. Exam Location:  Inpatient Procedure: 2D Echo, Cardiac Doppler and Color Doppler Indications:    NSTEMI  History:        Patient has prior history of Echocardiogram examinations, most                 recent 06/01/2019. Signs/Symptoms:Shortness of Breath and Chest                 Pain; Risk Factors:Sleep Apnea.  Sonographer:    Merrie Roof RDCS Referring Phys: [place]  1. Left ventricular ejection fraction, by estimation, is 60 to 65%. The left ventricle has normal function. The left ventricle has no regional wall motion abnormalities. Left ventricular diastolic parameters were normal.  2. Right ventricular systolic function is normal. The right ventricular size is  normal.  3. Left atrial size was moderately dilated.  4. Cannot r/o PFO.  5. The mitral valve is abnormal. No evidence of mitral valve regurgitation. No evidence of mitral stenosis.  6. Calcified non coronary cusp . The aortic valve is tricuspid. There is moderate calcification of the aortic valve. There is moderate thickening of the aortic valve. Aortic valve regurgitation is not visualized. Mild to  moderate aortic valve sclerosis/calcification is present, without any evidence of aortic stenosis.  7. The inferior vena cava is normal in size with greater than 50% respiratory variability, suggesting right atrial pressure of 3 mmHg. FINDINGS  Left Ventricle: Left ventricular ejection fraction, by estimation, is 60 to 65%. The left ventricle has normal function. The left ventricle has no regional wall motion abnormalities. The left ventricular internal cavity size was normal in size. There is  no left ventricular hypertrophy. Left ventricular diastolic parameters were normal. Right Ventricle: The right ventricular size is normal. No increase in right ventricular wall thickness. Right ventricular systolic function is normal. Left Atrium: Left atrial size was moderately dilated. Right Atrium: Right atrial size was normal in size. Pericardium: There is no evidence of pericardial effusion. Mitral Valve: The mitral valve is abnormal. There is mild thickening of the mitral valve leaflet(s). There is mild calcification of the mitral valve leaflet(s). Mild mitral annular calcification. No evidence of mitral valve regurgitation. No evidence of mitral valve stenosis. Tricuspid Valve: The tricuspid valve is normal in structure. Tricuspid valve regurgitation is trivial. No evidence of tricuspid stenosis. Aortic Valve: Calcified non coronary cusp. The aortic valve is tricuspid. There is moderate calcification of the aortic valve. There is moderate thickening of the aortic valve. Aortic valve regurgitation is not visualized. Mild to  moderate aortic valve sclerosis/calcification is present, without any evidence of aortic stenosis. Aortic valve mean gradient measures 8.0 mmHg. Aortic valve peak gradient measures 14.3 mmHg. Aortic valve area, by VTI measures 3.02 cm. Pulmonic Valve: The pulmonic valve was normal in structure. Pulmonic valve regurgitation is trivial. No evidence of pulmonic stenosis. Aorta: The aortic root is normal in size and structure. Venous: The inferior vena cava is normal in size with greater than 50% respiratory variability, suggesting right atrial pressure of 3 mmHg. IAS/Shunts: The interatrial septum was not well visualized.  LEFT VENTRICLE PLAX 2D LVIDd:         5.00 cm   Diastology LVIDs:         3.30 cm   LV e' medial:    7.18 cm/s LV PW:         1.10 cm   LV E/e' medial:  12.4 LV IVS:        1.00 cm   LV e' lateral:   11.50 cm/s LVOT diam:     2.10 cm   LV E/e' lateral: 7.8 LV SV:         107 LV SV Index:   48 LVOT Area:     3.46 cm  RIGHT VENTRICLE RV Basal diam:  3.70 cm LEFT ATRIUM             Index        RIGHT ATRIUM           Index LA diam:        4.50 cm 2.02 cm/m   RA Area:     21.60 cm LA Vol (A2C):   70.9 ml 31.75 ml/m  RA Volume:   58.40 ml  26.15 ml/m LA Vol (A4C):   80.1 ml 35.87 ml/m LA Biplane Vol: 76.8 ml 34.39 ml/m  AORTIC VALVE AV Area (Vmax):    2.88 cm AV Area (Vmean):   2.83 cm AV Area (VTI):     3.02 cm AV Vmax:           189.00 cm/s AV Vmean:          126.000 cm/s AV VTI:  0.356 m AV Peak Grad:      14.3 mmHg AV Mean Grad:      8.0 mmHg LVOT Vmax:         157.00 cm/s LVOT Vmean:        103.000 cm/s LVOT VTI:          0.310 m LVOT/AV VTI ratio: 0.87  AORTA Ao Root diam: 3.50 cm Ao Asc diam:  3.50 cm MITRAL VALVE MV Area (PHT): 3.30 cm     SHUNTS MV Decel Time: 230 msec     Systemic VTI:  0.31 m MV E velocity: 89.20 cm/s   Systemic Diam: 2.10 cm MV A velocity: 108.00 cm/s MV E/A ratio:  0.83 Jenkins Rouge MD Electronically signed by Jenkins Rouge MD Signature Date/Time:  05/01/2021/2:41:16 PM    Final     Scheduled Meds:  amLODipine  10 mg Oral Daily   aspirin EC  81 mg Oral Daily   atorvastatin  80 mg Oral Daily   isosorbide mononitrate  30 mg Oral Daily   nicotine  21 mg Transdermal Daily   sertraline  150 mg Oral Daily   sodium chloride flush  3 mL Intravenous Q12H    Continuous Infusions:  sodium chloride     heparin 1,550 Units/hr (05/01/21 1431)     LOS: 0 days     Alma Friendly, MD [facility]  If 7PM-7AM, please contact night-coverage [email or web address] 05/01/2021, 4:20 PM

## 2021-05-01 NOTE — Progress Notes (Signed)
  Echocardiogram 2D Echocardiogram has been performed.  Merrie Roof F 05/01/2021, 2:02 PM

## 2021-05-01 NOTE — Progress Notes (Signed)
Patient reported chest pain, sudden onset, 3/10 sharp stabbing pain when sitting up. Upon lying down for EKG, pain went down to 1/10. EKG done. 1 Nitro given at 0920, BP 128/74. At 0925, Pt sitting back up, CP free, BP 127/67. After sitting up for 5 min, at 0930 CP returned, 3/10 tight pressure. 2nd Nitro given, BP 130/78. At 0935, pain 1 or 2/10, BP 119/80. Pt feeling cold and nauseous, declined a 3rd nitro. MD Nishan notified.

## 2021-05-01 NOTE — Progress Notes (Signed)
Primary Cardiologist :  [place]  Subjective:  Denies SSCP, palpitations or Dyspnea Discussed cath with patient   Objective:  Vitals:   04/30/21 1840 04/30/21 2050 04/30/21 2347 05/01/21 0420  BP: 136/81 132/79 133/84 132/85  Pulse: 72  80 63  Resp: 20   19  Temp: 98.1 F (36.7 C) 97.8 F (36.6 C) 97.8 F (36.6 C)   TempSrc: Oral Oral Oral   SpO2: 94% 97% 95% 91%  Weight: 106.3 kg     Height: 5\' 10"  (1.778 m)       Intake/Output from previous day:  Intake/Output Summary (Last 24 hours) at 05/01/2021 1914 Last data filed at 05/01/2021 0600 Gross per 24 hour  Intake 190.68 ml  Output --  Net 190.68 ml    Physical Exam:  Affect appropriate Healthy:  appears stated age HEENT: normal Neck supple with no adenopathy JVP normal no bruits no thyromegaly Lungs clear with no wheezing and good diaphragmatic motion Heart:  S1/S2 no murmur, no rub, gallop or click PMI normal Abdomen: benighn, BS positve, no tenderness, no AAA no bruit.  No HSM or HJR Distal pulses intact with no bruits No edema Neuro non-focal Skin warm and dry No muscular weakness   Lab Results: Basic Metabolic Panel: Recent Labs    04/30/21 1432  NA 140  K 3.5  CL 107  CO2 25  GLUCOSE 121*  BUN 20  CREATININE 1.26*  CALCIUM 8.9   Liver Function Tests: Recent Labs    04/30/21 1849  AST 21  ALT 26  ALKPHOS 107  BILITOT 0.4  PROT 6.6  ALBUMIN 3.7   No results for input(s): LIPASE, AMYLASE in the last 72 hours. CBC: Recent Labs    04/30/21 1432 05/01/21 0627  WBC 11.3* 7.8  HGB 16.0 14.6  HCT 49.0 43.3  MCV 91.4 89.8  PLT 218 171   Cardiac Enzymes: No results for input(s): CKTOTAL, CKMB, CKMBINDEX, TROPONINI in the last 72 hours. BNP: Invalid input(s): POCBNP D-Dimer: No results for input(s): DDIMER in the last 72 hours. Hemoglobin A1C: No results for input(s): HGBA1C in the last 72 hours. Fasting Lipid Panel: No results for input(s): CHOL, HDL, LDLCALC,  TRIG, CHOLHDL, LDLDIRECT in the last 72 hours. Thyroid Function Tests: No results for input(s): TSH, T4TOTAL, T3FREE, THYROIDAB in the last 72 hours.  Invalid input(s): FREET3 Anemia Panel: No results for input(s): VITAMINB12, FOLATE, FERRITIN, TIBC, IRON, RETICCTPCT in the last 72 hours.  Imaging: Imaging results have been reviewed  Cardiac Studies:  ECG: NSR rate 66 tall R in V1   Telemetry: NSR 05/01/2021   Echo: EF normal 05/22/19   Medications:    amLODipine  10 mg Oral Daily   aspirin EC  81 mg Oral Daily   atorvastatin  80 mg Oral Daily   isosorbide mononitrate  30 mg Oral Daily   nicotine  21 mg Transdermal Daily   sertraline  150 mg Oral Daily   sodium chloride flush  3 mL Intravenous Q12H      sodium chloride     heparin 1,500 Units/hr (05/01/21 0713)    Assessment/Plan:   NSTEMI:  troponin peak 399 no acute ECG changes Continue heparin nitrates ASA and statin No beta blocker with cocaine use For cath Monday Discussed procedure and risks including contrast reaction, stroke, bleeding need for emergency surgery Willing to proceed  Smoking:  continue transdermal patch Depression:  continue sertraline   Jenkins Rouge 05/01/2021, 7:52 AM

## 2021-05-01 NOTE — Progress Notes (Signed)
RN received orders for urine drug screen. Pt has been refusing to urinate in urinal to obtain sample, using the toilet instead. Pt educated multiple times throughout the day purpose for the need of a urine sample. Pt stated he understood.

## 2021-05-01 NOTE — Progress Notes (Addendum)
[place] for heparin Indication: chest pain/ACS  Allergies  Allergen Reactions   Bee Venom Swelling    Severe swelling   Fish Allergy Anaphylaxis    Throat swelling with seafood   Iodinated Diagnostic Agents Anaphylaxis   Latex Anaphylaxis   Penicillins Anaphylaxis    Has patient had a PCN reaction causing immediate rash, facial/tongue/throat swelling, SOB or lightheadedness with hypotension: Yes Has patient had a PCN reaction causing severe rash involving mucus membranes or skin necrosis: No Has patient had a PCN reaction that required hospitalization No Has patient had a PCN reaction occurring within the last 10 years: No If all of the above answers are "NO", then may proceed with Cephalosporin use.     Patient Measurements: Height: 5\' 10"  (177.8 cm) Weight: 106.3 kg (234 lb 6.4 oz) IBW/kg (Calculated) : 73 Heparin Dosing Weight: 95.2kg  Vital Signs: Temp: 97.8 F (36.6 C) (10/14 2347) Temp Source: Oral (10/14 2347) BP: 132/85 (10/15 0420) Pulse Rate: 63 (10/15 0420)  Labs: Recent Labs    04/30/21 1432 04/30/21 1849 04/30/21 2223 05/01/21 0627  HGB 16.0  --   --  14.6  HCT 49.0  --   --  43.3  PLT 218  --   --  171  HEPARINUNFRC  --   --  0.13* 0.42  CREATININE 1.26*  --   --   --   TROPONINIHS 210* 399*  --   --      Estimated Creatinine Clearance: 80.9 mL/min (A) (by C-G formula based on SCr of 1.26 mg/dL (H)).   Medical History: Past Medical History:  Diagnosis Date   Acute kidney failure ([place])    after last surgery   Alcohol abuse, in remission 11/03/2015   Aortic root dilation (HCC)    Chronic kidney disease, stage 3a (HCC)    Chronic lower back pain    Cocaine abuse ([place]) 11/03/2015   Depression    GERD (gastroesophageal reflux disease)    occasionally and will take Rolaid if needed   Hemorrhoids    History of kidney stones    History of MRSA infection    several yrs ago   Hypertension    OSA on CPAP     study was done 3 yrs ago.  Wears a cpap   Polysubstance dependence, non-opioid, episodic (HCC)    RSD (reflex sympathetic dystrophy), L foot due to calcaneus fx 08/14/2012   Assessment: 50 YOM presenting with CP, elevated troponin, he is not on anticoagulation PTA. Patient with increasing history of chest pain over last couple of months. R/o atherosclerotic process vs cocaine-induced vasopspasm given patient's recent history of cocaine use. Plan is for cath on Monday if troponins continue to rise. Pharmacy to dose heparin.   Heparin level 0.42 and therapeutic on 1500 units/h. CBC is stable and within normal limits. No issues with line or bleeding reported per RN.  Goal of Therapy:  Heparin level 0.3-0.7 units/ml Monitor platelets by anticoagulation protocol: Yes   Plan:  Continue IV heparin gtt at 1500 units/hr Confirmatory heparin level in 6 h Daily heparin level, CBC Monitor for signs/symptoms of bleeding F/u cards recommendations  Thank you for involving pharmacy in this patient's care.  Elita Quick, PharmD PGY1 Ambulatory Care Pharmacy Resident 05/01/2021 7:33 AM  **Pharmacist phone directory can be found on [email or web address] listed under [place]**  Addendum: Confirmatory heparin level today 0.31 and therapeutic, but on lower end. Discussed with RN and IV heparin not stopped,  no IV infusion issues and no signs of bleeding. Earlier heparin level higher and likely due to bolus effect. Will proactively increase by 50 units/h to keep therapeutic.   Plan: Increase IV heparin gtt to 1550 units/h Daily heparin level, CBC Monitor for signs/symptoms of bleeding  Thank you for involving pharmacy in this patient's care.  Elita Quick, PharmD PGY1 Ambulatory Care Pharmacy Resident 05/01/2021 2:02 PM  **Pharmacist phone directory can be found on [email or web address] listed under [place]**

## 2021-05-01 NOTE — Progress Notes (Addendum)
Pt report 8/10 sharp stabbing CP mid left chest. MD Nishan notified. At 1646, BP 149/95, 1 nitro given. At 1652, CP 3/10, BP 144/83, 2nd nitro given. EKG done. At 1657, CP 2/10, BP 141/84, pt refusing 3rd nitro.   1704 pt reports 8/10 CP return. BP 138/71. 3rd nitro given. 1709 CP 5/10, BP 142/85. On call MD paged.

## 2021-05-02 LAB — BASIC METABOLIC PANEL
Anion gap: 8 (ref 5–15)
BUN: 19 mg/dL (ref 6–20)
CO2: 26 mmol/L (ref 22–32)
Calcium: 8.7 mg/dL — ABNORMAL LOW (ref 8.9–10.3)
Chloride: 105 mmol/L (ref 98–111)
Creatinine, Ser: 1.25 mg/dL — ABNORMAL HIGH (ref 0.61–1.24)
GFR, Estimated: >60 mL/min (ref >60)
Glucose, Bld: 127 mg/dL — ABNORMAL HIGH (ref 70–99)
Potassium: 4.1 mmol/L (ref 3.5–5.1)
Sodium: 139 mmol/L (ref 135–145)

## 2021-05-02 LAB — CBC
HCT: 44.1 % (ref 39.0–52.0)
Hemoglobin: 14.4 g/dL (ref 13.0–17.0)
MCH: 29.9 pg (ref 26.0–34.0)
MCHC: 32.7 g/dL (ref 30.0–36.0)
MCV: 91.7 fL (ref 80.0–100.0)
Platelets: 179 10*3/uL (ref 150–400)
RBC: 4.81 MIL/uL (ref 4.22–5.81)
RDW: 12.7 % (ref 11.5–15.5)
WBC: 7.9 10*3/uL (ref 4.0–10.5)
nRBC: 0 % (ref 0.0–0.2)

## 2021-05-02 LAB — HEPARIN LEVEL (UNFRACTIONATED): Heparin Unfractionated: 0.47 IU/mL (ref 0.30–0.70)

## 2021-05-02 MED ORDER — SODIUM CHLORIDE 0.9 % IV SOLN: 250.0000 mL | INTRAVENOUS | Status: DC | PRN

## 2021-05-02 MED ORDER — ASPIRIN 81 MG PO CHEW
81.0000 mg | CHEWABLE_TABLET | ORAL | Status: AC
  Administered 2021-05-03: 81 mg via ORAL
  Filled 2021-05-02: qty 1

## 2021-05-02 MED ORDER — SODIUM CHLORIDE 0.9 % WEIGHT BASED INFUSION
3.0000 mL/kg/h | INTRAVENOUS | Status: DC
  Administered 2021-05-03: 3 mL/kg/h via INTRAVENOUS

## 2021-05-02 MED ORDER — SODIUM CHLORIDE 0.9 % WEIGHT BASED INFUSION
1.0000 mL/kg/h | INTRAVENOUS | Status: DC
  Administered 2021-05-03 (×2): 1 mL/kg/h via INTRAVENOUS

## 2021-05-02 MED ORDER — SODIUM CHLORIDE 0.9% FLUSH
3.0000 mL | Freq: Two times a day (BID) | INTRAVENOUS | Status: DC
  Administered 2021-05-02 – 2021-05-04 (×4): 3 mL via INTRAVENOUS

## 2021-05-02 MED ORDER — SODIUM CHLORIDE 0.9% FLUSH: 3.0000 mL | INTRAVENOUS | Status: DC | PRN

## 2021-05-02 MED ORDER — RAMELTEON 8 MG PO TABS
8.0000 mg | ORAL_TABLET | Freq: Every evening | ORAL | Status: DC | PRN
  Administered 2021-05-02 – 2021-05-03 (×2): 8 mg via ORAL
  Filled 2021-05-02 (×3): qty 1

## 2021-05-02 NOTE — Progress Notes (Signed)
Primary Cardiologist :  McHalaney  Subjective:  Chest pain yesterday with no acute ECG changes and troponin coming down   Objective:  Vitals:   05/01/21 1657 05/01/21 2125 05/02/21 0106 05/02/21 0600  BP: (!) 141/84 (!) 149/91 (!) 163/90 130/67  Pulse: 78     Resp:  20 20   Temp:  98 F (36.7 C) 97.7 F (36.5 C) 98 F (36.7 C)  TempSrc:  Oral Oral Oral  SpO2: 92% 96% 96%   Weight:      Height:        Intake/Output from previous day:  Intake/Output Summary (Last 24 hours) at 05/02/2021 0849 Last data filed at 05/02/2021 0400 Gross per 24 hour  Intake 872.68 ml  Output 400 ml  Net 472.68 ml    Physical Exam:  Affect appropriate Healthy:  appears stated age HEENT: normal Neck supple with no adenopathy JVP normal no bruits no thyromegaly Lungs clear with no wheezing and good diaphragmatic motion Heart:  S1/S2 no murmur, no rub, gallop or click PMI normal Abdomen: benighn, BS positve, no tenderness, no AAA no bruit.  No HSM or HJR Distal pulses intact with no bruits No edema Neuro non-focal Skin warm and dry No muscular weakness   Lab Results: Basic Metabolic Panel: Recent Labs    05/01/21 0627 05/02/21 0222  NA 137 139  K 4.1 4.1  CL 106 105  CO2 24 26  GLUCOSE 101* 127*  BUN 18 19  CREATININE 1.14 1.25*  CALCIUM 8.3* 8.7*   Liver Function Tests: Recent Labs    04/30/21 1849  AST 21  ALT 26  ALKPHOS 107  BILITOT 0.4  PROT 6.6  ALBUMIN 3.7   No results for input(s): LIPASE, AMYLASE in the last 72 hours. CBC: Recent Labs    05/01/21 0627 05/02/21 0222  WBC 7.8 7.9  HGB 14.6 14.4  HCT 43.3 44.1  MCV 89.8 91.7  PLT 171 179   Cardiac Enzymes: No results for input(s): CKTOTAL, CKMB, CKMBINDEX, TROPONINI in the last 72 hours. BNP: Invalid input(s): POCBNP D-Dimer: No results for input(s): DDIMER in the last 72 hours. Hemoglobin A1C: No results for input(s): HGBA1C in the last 72 hours. Fasting Lipid Panel: Recent Labs     05/01/21 0627  CHOL 184  HDL 39*  LDLCALC 129*  TRIG 79  CHOLHDL 4.7   Thyroid Function Tests: No results for input(s): TSH, T4TOTAL, T3FREE, THYROIDAB in the last 72 hours.  Invalid input(s): FREET3 Anemia Panel: No results for input(s): VITAMINB12, FOLATE, FERRITIN, TIBC, IRON, RETICCTPCT in the last 72 hours.  Imaging: Imaging results have been reviewed  Cardiac Studies:  ECG: NSR rate 66 tall R in V1   Telemetry: NSR 05/02/2021   Echo: EF normal 05/22/19   Medications:    amLODipine  10 mg Oral Daily   [START ON 05/03/2021] aspirin  81 mg Oral Pre-Cath   aspirin EC  81 mg Oral Daily   atorvastatin  80 mg Oral Daily   isosorbide mononitrate  30 mg Oral Daily   nicotine  21 mg Transdermal Daily   sertraline  150 mg Oral Daily   sodium chloride flush  3 mL Intravenous Q12H   sodium chloride flush  3 mL Intravenous Q12H      sodium chloride     sodium chloride     [START ON 05/03/2021] sodium chloride     Followed by   Derrill Memo ON 05/03/2021] sodium chloride  heparin 1,550 Units/hr (05/02/21 0105)    Assessment/Plan:   NSTEMI:  troponin peak 399 no acute ECG changes Continue heparin nitrates ASA and statin No beta blocker with cocaine use For cath Monday Discussed procedure and risks including contrast reaction, stroke, bleeding need for emergency surgery Willing to proceed  Smoking:  continue transdermal patch Depression:  continue sertraline  Substance abuse:  urine tox screen positive for cocaine despite patient indicating its been 9 days since last use   Jenkins Rouge 05/02/2021, 8:49 AM

## 2021-05-02 NOTE — Progress Notes (Signed)
[place] for heparin Indication: chest pain/ACS  Allergies  Allergen Reactions   Bee Venom Swelling    Severe swelling   Fish Allergy Anaphylaxis    Throat swelling with seafood   Iodinated Diagnostic Agents Anaphylaxis   Latex Anaphylaxis   Penicillins Anaphylaxis    Has patient had a PCN reaction causing immediate rash, facial/tongue/throat swelling, SOB or lightheadedness with hypotension: Yes Has patient had a PCN reaction causing severe rash involving mucus membranes or skin necrosis: No Has patient had a PCN reaction that required hospitalization No Has patient had a PCN reaction occurring within the last 10 years: No If all of the above answers are "NO", then may proceed with Cephalosporin use.     Patient Measurements: Height: 5\' 10"  (062.3 cm) Weight: 106.3 kg (234 lb 6.4 oz) IBW/kg (Calculated) : 73 Heparin Dosing Weight: 95.2kg  Vital Signs: Temp: 98 F (36.7 C) (10/16 0600) Temp Source: Oral (10/16 0600) BP: 155/99 (10/16 0926)  Labs: Recent Labs    04/30/21 1432 04/30/21 1849 04/30/21 2223 05/01/21 0627 05/01/21 1159 05/01/21 1717 05/02/21 0222 05/02/21 0749  HGB 16.0  --   --  14.6  --   --  14.4  --   HCT 49.0  --   --  43.3  --   --  44.1  --   PLT 218  --   --  171  --   --  179  --   HEPARINUNFRC  --   --    < > 0.42 0.31  --   --  0.47  CREATININE 1.26*  --   --  1.14  --   --  1.25*  --   TROPONINIHS 210* 399*  --  358*  --  212*  --   --    < > = values in this interval not displayed.     Estimated Creatinine Clearance: 81.5 mL/min (A) (by C-G formula based on SCr of 1.25 mg/dL (H)).   Medical History: Past Medical History:  Diagnosis Date   Acute kidney failure ([place])    after last surgery   Alcohol abuse, in remission 11/03/2015   Aortic root dilation (HCC)    Chronic kidney disease, stage 3a (HCC)    Chronic lower back pain    Cocaine abuse ([place]) 11/03/2015   Depression    GERD  (gastroesophageal reflux disease)    occasionally and will take Rolaid if needed   Hemorrhoids    History of kidney stones    History of MRSA infection    several yrs ago   Hypertension    OSA on CPAP    study was done 3 yrs ago.  Wears a cpap   Polysubstance dependence, non-opioid, episodic (HCC)    RSD (reflex sympathetic dystrophy), L foot due to calcaneus fx 08/14/2012   Assessment: 78 YOM presenting with CP, elevated troponin, he is not on anticoagulation PTA. Patient with increasing history of chest pain over last couple of months. R/o atherosclerotic process vs cocaine-induced vasopspasm given patient's recent history of cocaine use. Plan is for cath on Monday. Pharmacy to dose heparin.   Heparin level 0.47 and therapeutic on 1550 units/h. CBC is stable and within normal limits. No issues with line or bleeding reported per RN.  Goal of Therapy:  Heparin level 0.3-0.7 units/ml Monitor platelets by anticoagulation protocol: Yes   Plan:  Continue IV heparin gtt at 1550 units/hr Daily heparin level, CBC Monitor for signs/symptoms of bleeding  F/u cards recommendations  Thank you for involving pharmacy in this patient's care.  Elita Quick, PharmD PGY1 Ambulatory Care Pharmacy Resident 05/02/2021 10:20 AM  **Pharmacist phone directory can be found on [email or web address] listed under [facility]**

## 2021-05-02 NOTE — Progress Notes (Signed)
PROGRESS NOTE  [name] PYK:[id number] DOB: 05-Apr-1966 DOA: 04/30/2021 PCP: Owens Loffler, MD  HPI/Recap of past 24 hours: [name] is a 55 y.o. male with medical history significant of depression, HTN, cocaine abuse, tobacco abuse, CKD stage IIIa, aortic root dilation, and OSA on cpap who presented to ED with chest pain. He states the pain started the night PTA. Pain was substernal and rated as a 10/10 and described as sharp, lasts 10-15 mins and has been intermittent for the past week. Radiated to his right shoulder and bilateral jaws, has associated shortness of breath. 911 was called by his mother and EMS gave him NG and it resolved his pain. His pain started to come back and he was given another NG on way to hospital. Family history of CAD in his father. Prediabetic and hx of HTN. Smokes 1ppd. Last used cocaine 9 days ago. In the ED VS stable, labs with WBC: 11.3, creatinine 1.26, initial troponin 210, CXR with no acute findings. Patient was started on heparin drip. Cardiology consulted.TRH was asked to admit.     Today, met patient sleeping, easily arousable, denies any worsening chest pain, still with nausea.     Assessment/Plan: Principal Problem:   NSTEMI (non-ST elevated myocardial infarction) ([place]) Active Problems:   Major depressive disorder, recurrent episode, moderate (HCC)   HTN (hypertension), benign   Tobacco use disorder   Cocaine abuse ([place])   Prediabetes   Stage 3a chronic kidney disease (CKD) (HCC)   NSTEMI Troponin 210-->399-->358-->212 EKG with no acute ST changes CXR unremarkable Echo done showed EF of 60-65%, no RWMA Cardiology consulted, plan for cardiac cath on 05/03/21 Continue Heparin drip, aspirin, statin, NG prn Avoid beta-blockers in setting of ongoing cocaine abuse Telemetry, monitor closely  HTN Continue home Norvasc, Imdur added by cardiology  HLD LDL 129 Continue Lipitor  Pre-diabetes   Recent A1c 6.1 on 8/22  Diet  modification  Stage 3a chronic kidney disease  Cr at baseline Avoid nephrotoxic drugs  Aortic root dilation Minimally dilated aortic root at 38 mm on last echo in November 2020 Repeat echo with normal aortic root   OSA Continue cpap at night   Polysubstance abuse Tobacco and cocaine abuse UDS positive for cocacine  Avoid BB Encourage cessation Nicotine patch   Major depressive disorder, recurrent episode, moderate (HCC) Followed by psychiatry outpatient Difficult to control depression and SI ideation in the past No SI/HI Continue Zoloft 150 mg daily   Obesity Body mass index is 33.63 kg/m. Estimated body mass index is 33.63 kg/m as calculated from the following:   Height as of this encounter: _0  (1.778 m).   Weight as of this encounter: 106.3 kg.       Code Status: Full  Family Communication: None at bedside  Disposition Plan: Status is: Inpatient  Remains inpatient appropriate because: Level of care    Consultants: Cardiology  Procedures: None  Antimicrobials: None  DVT prophylaxis: Heparin drip    Objective: Vitals:   05/02/21 0106 05/02/21 0600 05/02/21 0926 05/02/21 1410  BP: (!) 163/90 130/67 (!) 155/99 133/84  Pulse:      Resp: 20   16  Temp: 97.7 F (36.5 C) 98 F (36.7 C)  97.7 F (36.5 C)  TempSrc: Oral Oral  Oral  SpO2: 96%   95%  Weight:      Height:        Intake/Output Summary (Last 24 hours) at 05/02/2021 1555 Last data filed at 05/02/2021 0400  Gross per 24 hour  Intake 872.68 ml  Output 400 ml  Net 472.68 ml   Filed Weights   04/30/21 1332 04/30/21 1840  Weight: 104.3 kg 106.3 kg    Exam: General: NAD Cardiovascular: S1, S2 present Respiratory: CTAB Abdomen: Soft, nontender, nondistended, bowel sounds present Musculoskeletal: No bilateral pedal edema noted Skin: Normal Psychiatry: Fair mood     Data Reviewed: CBC: Recent Labs  Lab 04/30/21 1432 05/01/21 0627 05/02/21 0222  WBC 11.3* 7.8 7.9   HGB 16.0 14.6 14.4  HCT 49.0 43.3 44.1  MCV 91.4 89.8 91.7  PLT 218 171 403   Basic Metabolic Panel: Recent Labs  Lab 04/30/21 1432 05/01/21 0627 05/02/21 0222  NA 140 137 139  K 3.5 4.1 4.1  CL 107 106 105  CO2 _0 GLUCOSE 121* 101* 127*  BUN _1 CREATININE 1.26* 1.14 1.25*  CALCIUM 8.9 8.3* 8.7*   GFR: Estimated Creatinine Clearance: 81.5 mL/min (A) (by C-G formula based on SCr of 1.25 mg/dL (H)). Liver Function Tests: Recent Labs  Lab 04/30/21 1849  AST 21  ALT 26  ALKPHOS 107  BILITOT 0.4  PROT 6.6  ALBUMIN 3.7   No results for input(s): LIPASE, AMYLASE in the last 168 hours. No results for input(s): AMMONIA in the last 168 hours. Coagulation Profile: No results for input(s): INR, PROTIME in the last 168 hours. Cardiac Enzymes: No results for input(s): CKTOTAL, CKMB, CKMBINDEX, TROPONINI in the last 168 hours. BNP (last 3 results) No results for input(s): PROBNP in the last 8760 hours. HbA1C: No results for input(s): HGBA1C in the last 72 hours. CBG: No results for input(s): GLUCAP in the last 168 hours. Lipid Profile: Recent Labs    05/01/21 0627  CHOL 184  HDL 39*  LDLCALC 129*  TRIG 79  CHOLHDL 4.7   Thyroid Function Tests: No results for input(s): TSH, T4TOTAL, FREET4, T3FREE, THYROIDAB in the last 72 hours. Anemia Panel: No results for input(s): VITAMINB12, FOLATE, FERRITIN, TIBC, IRON, RETICCTPCT in the last 72 hours. Urine analysis:    Component Value Date/Time   COLORURINE YELLOW 04/26/2019 1344   APPEARANCEUR CLEAR 04/26/2019 1344   LABSPEC 1.021 04/26/2019 1344   PHURINE 5.0 04/26/2019 1344   GLUCOSEU NEGATIVE 04/26/2019 1344   HGBUR NEGATIVE 04/26/2019 1344   BILIRUBINUR NEGATIVE 04/26/2019 1344   BILIRUBINUR negative 06/21/2013 [place] 04/26/2019 1344   PROTEINUR NEGATIVE 04/26/2019 1344   UROBILINOGEN 0.2 09/21/2014 0849   NITRITE NEGATIVE 04/26/2019 1344   LEUKOCYTESUR NEGATIVE 04/26/2019 1344    Sepsis Labs: _2 (procalcitonin:4,lacticidven:4)  ) Recent Results (from the past 240 hour(s))  Resp Panel by RT-PCR (Flu A&B, Covid) Nasopharyngeal Swab     Status: None   Collection Time: 04/30/21  6:10 PM   Specimen: Nasopharyngeal Swab; Nasopharyngeal(NP) swabs in vial transport medium  Result Value Ref Range Status   SARS Coronavirus 2 by RT PCR NEGATIVE NEGATIVE Final    Comment: (NOTE) SARS-CoV-2 target nucleic acids are NOT DETECTED.  The SARS-CoV-2 RNA is generally detectable in upper respiratory specimens during the acute phase of infection. The lowest concentration of SARS-CoV-2 viral copies this assay can detect is 138 copies/mL. A negative result does not preclude SARS-Cov-2 infection and should not be used as the sole basis for treatment or other patient management decisions. A negative result may occur with  improper specimen collection/handling, submission of specimen other than nasopharyngeal swab, presence of viral mutation(s) within the areas targeted by this  assay, and inadequate number of viral copies(<138 copies/mL). A negative result must be combined with clinical observations, patient history, and epidemiological information. The expected result is Negative.  Fact Sheet for Patients:  EntrepreneurPulse.com.au  Fact Sheet for Healthcare Providers:  IncredibleEmployment.be  This test is no t yet approved or cleared by the [place] FDA and  has been authorized for detection and/or diagnosis of SARS-CoV-2 by FDA under an Emergency Use Authorization (EUA). This EUA will remain  in effect (meaning this test can be used) for the duration of the COVID-19 declaration under Section 564(b)(1) of the Act, 21 U.S.C.section 360bbb-3(b)(1), unless the authorization is terminated  or revoked sooner.       Influenza A by PCR NEGATIVE NEGATIVE Final   Influenza B by PCR NEGATIVE NEGATIVE Final    Comment: (NOTE) The  Xpert Xpress SARS-CoV-2/FLU/RSV plus assay is intended as an aid in the diagnosis of influenza from Nasopharyngeal swab specimens and should not be used as a sole basis for treatment. Nasal washings and aspirates are unacceptable for Xpert Xpress SARS-CoV-2/FLU/RSV testing.  Fact Sheet for Patients: EntrepreneurPulse.com.au  Fact Sheet for Healthcare Providers: IncredibleEmployment.be  This test is not yet approved or cleared by the [place] FDA and has been authorized for detection and/or diagnosis of SARS-CoV-2 by FDA under an Emergency Use Authorization (EUA). This EUA will remain in effect (meaning this test can be used) for the duration of the COVID-19 declaration under Section 564(b)(1) of the Act, 21 U.S.C. section 360bbb-3(b)(1), unless the authorization is terminated or revoked.  Performed at [facility], [place] [address], [place], [place] [address]       Studies: No results found.  Scheduled Meds:  amLODipine  10 mg Oral Daily   [START ON 05/03/2021] aspirin  81 mg Oral Pre-Cath   aspirin EC  81 mg Oral Daily   atorvastatin  80 mg Oral Daily   isosorbide mononitrate  30 mg Oral Daily   nicotine  21 mg Transdermal Daily   sertraline  150 mg Oral Daily   sodium chloride flush  3 mL Intravenous Q12H   sodium chloride flush  3 mL Intravenous Q12H    Continuous Infusions:  sodium chloride     sodium chloride     [START ON 05/03/2021] sodium chloride     Followed by   Derrill Memo ON 05/03/2021] sodium chloride     heparin 1,550 Units/hr (05/02/21 0105)     LOS: 1 day     Alma Friendly, MD [facility]  If 7PM-7AM, please contact night-coverage [email or web address] 05/02/2021, 3:55 PM

## 2021-05-02 NOTE — Progress Notes (Signed)
Patient refuses NIV/CPAP for the night, no distress or complications noted at this time.

## 2021-05-03 ENCOUNTER — Inpatient Hospital Stay (HOSPITAL_COMMUNITY)
Admission: EM | Disposition: A | Discharge status: Home / Self Care | Payer: Self-pay | Source: Home / Self Care | Attending: Internal Medicine

## 2021-05-03 DIAGNOSIS — I2510 Atherosclerotic heart disease of native coronary artery without angina pectoris: Secondary | ICD-10-CM

## 2021-05-03 HISTORY — PX: LEFT HEART CATH AND CORONARY ANGIOGRAPHY: CATH118249

## 2021-05-03 HISTORY — PX: CORONARY STENT INTERVENTION: CATH118234

## 2021-05-03 HISTORY — PX: CORONARY PRESSURE/FFR STUDY: CATH118243

## 2021-05-03 LAB — BASIC METABOLIC PANEL
Anion gap: 9 (ref 5–15)
BUN: 18 mg/dL (ref 6–20)
CO2: 23 mmol/L (ref 22–32)
Calcium: 9 mg/dL (ref 8.9–10.3)
Chloride: 105 mmol/L (ref 98–111)
Creatinine, Ser: 1.05 mg/dL (ref 0.61–1.24)
GFR, Estimated: >60 mL/min (ref >60)
Glucose, Bld: 111 mg/dL — ABNORMAL HIGH (ref 70–99)
Potassium: 4.2 mmol/L (ref 3.5–5.1)
Sodium: 137 mmol/L (ref 135–145)

## 2021-05-03 LAB — CBC
HCT: 44.4 % (ref 39.0–52.0)
Hemoglobin: 14.9 g/dL (ref 13.0–17.0)
MCH: 30 pg (ref 26.0–34.0)
MCHC: 33.6 g/dL (ref 30.0–36.0)
MCV: 89.3 fL (ref 80.0–100.0)
Platelets: 190 10*3/uL (ref 150–400)
RBC: 4.97 MIL/uL (ref 4.22–5.81)
RDW: 12.7 % (ref 11.5–15.5)
WBC: 7.6 10*3/uL (ref 4.0–10.5)
nRBC: 0 % (ref 0.0–0.2)

## 2021-05-03 LAB — POCT ACTIVATED CLOTTING TIME
Activated Clotting Time: 265 seconds
Activated Clotting Time: 277 seconds
Activated Clotting Time: 654 seconds

## 2021-05-03 LAB — HEPARIN LEVEL (UNFRACTIONATED): Heparin Unfractionated: 0.27 IU/mL — ABNORMAL LOW (ref 0.30–0.70)

## 2021-05-03 SURGERY — LEFT HEART CATH AND CORONARY ANGIOGRAPHY
Anesthesia: LOCAL

## 2021-05-03 MED ORDER — HEPARIN (PORCINE) IN NACL 1000-0.9 UT/500ML-% IV SOLN
INTRAVENOUS | Status: DC | PRN
  Administered 2021-05-03 (×2): 500 mL

## 2021-05-03 MED ORDER — MIDAZOLAM HCL 2 MG/2ML IJ SOLN
INTRAMUSCULAR | Status: DC | PRN
  Administered 2021-05-03 (×2): 1 mg via INTRAVENOUS

## 2021-05-03 MED ORDER — METHYLPREDNISOLONE SODIUM SUCC 125 MG IJ SOLR
125.0000 mg | Freq: Once | INTRAMUSCULAR | Status: AC
  Administered 2021-05-03: 125 mg via INTRAVENOUS
  Filled 2021-05-03: qty 2

## 2021-05-03 MED ORDER — VERAPAMIL HCL 2.5 MG/ML IV SOLN
INTRAVENOUS | Status: DC | PRN
  Administered 2021-05-03: 10 mL via INTRA_ARTERIAL

## 2021-05-03 MED ORDER — FENTANYL CITRATE (PF) 100 MCG/2ML IJ SOLN
INTRAMUSCULAR | Status: AC
  Filled 2021-05-03: qty 2

## 2021-05-03 MED ORDER — HEPARIN SODIUM (PORCINE) 1000 UNIT/ML IJ SOLN
INTRAMUSCULAR | Status: AC
  Filled 2021-05-03: qty 1

## 2021-05-03 MED ORDER — MIDAZOLAM HCL 2 MG/2ML IJ SOLN
INTRAMUSCULAR | Status: AC
  Filled 2021-05-03: qty 2

## 2021-05-03 MED ORDER — IOHEXOL 350 MG/ML SOLN
INTRAVENOUS | Status: DC | PRN
  Administered 2021-05-03: 190 mL via INTRA_ARTERIAL

## 2021-05-03 MED ORDER — HEPARIN (PORCINE) IN NACL 1000-0.9 UT/500ML-% IV SOLN
INTRAVENOUS | Status: AC
  Filled 2021-05-03: qty 1000

## 2021-05-03 MED ORDER — TICAGRELOR 90 MG PO TABS
ORAL_TABLET | ORAL | Status: AC
  Filled 2021-05-03: qty 1

## 2021-05-03 MED ORDER — TICAGRELOR 90 MG PO TABS
ORAL_TABLET | ORAL | Status: DC | PRN
  Administered 2021-05-03: 180 mg via ORAL

## 2021-05-03 MED ORDER — NITROGLYCERIN 1 MG/10 ML FOR IR/CATH LAB
INTRA_ARTERIAL | Status: AC
  Filled 2021-05-03: qty 10

## 2021-05-03 MED ORDER — HYDRALAZINE HCL 20 MG/ML IJ SOLN: 10.0000 mg | INTRAMUSCULAR | Status: AC | PRN

## 2021-05-03 MED ORDER — LIDOCAINE HCL (PF) 1 % IJ SOLN
INTRAMUSCULAR | Status: DC | PRN
  Administered 2021-05-03: 2 mL

## 2021-05-03 MED ORDER — SODIUM CHLORIDE 0.9 % IV SOLN: 250.0000 mL | INTRAVENOUS | Status: DC | PRN

## 2021-05-03 MED ORDER — HEPARIN SODIUM (PORCINE) 1000 UNIT/ML IJ SOLN
INTRAMUSCULAR | Status: DC | PRN
  Administered 2021-05-03 (×2): 3000 [IU] via INTRAVENOUS
  Administered 2021-05-03: 5000 [IU] via INTRAVENOUS
  Administered 2021-05-03: 2000 [IU] via INTRAVENOUS

## 2021-05-03 MED ORDER — FAMOTIDINE IN NACL 20-0.9 MG/50ML-% IV SOLN
20.0000 mg | Freq: Once | INTRAVENOUS | Status: AC
  Administered 2021-05-03: 20 mg via INTRAVENOUS
  Filled 2021-05-03: qty 50

## 2021-05-03 MED ORDER — LABETALOL HCL 5 MG/ML IV SOLN: 10.0000 mg | INTRAVENOUS | Status: AC | PRN

## 2021-05-03 MED ORDER — LIDOCAINE HCL (PF) 1 % IJ SOLN
INTRAMUSCULAR | Status: AC
  Filled 2021-05-03: qty 30

## 2021-05-03 MED ORDER — FENTANYL CITRATE (PF) 100 MCG/2ML IJ SOLN
INTRAMUSCULAR | Status: DC | PRN
  Administered 2021-05-03 (×2): 25 ug via INTRAVENOUS

## 2021-05-03 MED ORDER — VERAPAMIL HCL 2.5 MG/ML IV SOLN
INTRAVENOUS | Status: AC
  Filled 2021-05-03: qty 2

## 2021-05-03 MED ORDER — DIPHENHYDRAMINE HCL 50 MG/ML IJ SOLN
25.0000 mg | Freq: Once | INTRAMUSCULAR | Status: AC
  Administered 2021-05-03: 25 mg via INTRAVENOUS
  Filled 2021-05-03: qty 1

## 2021-05-03 MED ORDER — TICAGRELOR 90 MG PO TABS
90.0000 mg | ORAL_TABLET | Freq: Two times a day (BID) | ORAL | Status: DC
  Administered 2021-05-04: 90 mg via ORAL
  Filled 2021-05-03: qty 1

## 2021-05-03 SURGICAL SUPPLY — 21 items
BALLN SAPPHIRE 2.5X12 (BALLOONS) ×4
BALLN SAPPHIRE ~~LOC~~ 3.0X8 (BALLOONS) ×1 IMPLANT
BALLOON SAPPHIRE 2.5X12 (BALLOONS) IMPLANT
CATH DIAG 6FR JR4 (CATHETERS) ×1 IMPLANT
CATH DIAG 6FR PIGTAIL ANGLED (CATHETERS) ×1 IMPLANT
CATH INFINITI 6F FL3.5 (CATHETERS) ×1 IMPLANT
CATH LAUNCHER 6FR EBU3.5 (CATHETERS) ×1 IMPLANT
DEVICE RAD COMP TR BAND LRG (VASCULAR PRODUCTS) ×1 IMPLANT
GLIDESHEATH SLEND SS 6F .021 (SHEATH) ×1 IMPLANT
GUIDEWIRE PRESSURE X 175 (WIRE) ×1 IMPLANT
GUIDEWIRE VAS SION BLUE 190 (WIRE) ×1 IMPLANT
KIT ENCORE 26 ADVANTAGE (KITS) ×1 IMPLANT
KIT HEART LEFT (KITS) ×1 IMPLANT
KIT HEMO VALVE WATCHDOG (MISCELLANEOUS) ×1 IMPLANT
PACK CARDIAC CATHETERIZATION (CUSTOM PROCEDURE TRAY) ×1 IMPLANT
STENT ONYX FRONTIER 2.75X18 (Permanent Stent) ×1 IMPLANT
STENT ONYX FRONTIER 3.5X15 (Permanent Stent) ×1 IMPLANT
SYR MEDRAD MARK 7 150ML (SYRINGE) ×1 IMPLANT
TRANSDUCER W/STOPCOCK (MISCELLANEOUS) ×1 IMPLANT
TUBING CIL FLEX 10 FLL-RA (TUBING) ×1 IMPLANT
WIRE EMERALD 3MM-J .035X260CM (WIRE) ×1 IMPLANT

## 2021-05-03 NOTE — Care Management (Signed)
1352 05-03-21 Case Manager spoke with the patients family member. Patient has a PCP in [facility]; however, the office is undergoing renovations. The new location is near [facility]. Appointment has been scheduled and information placed on the AVS. Patient is not working; however family is assisting with medications. Patient uses [facility] Case Manager will continue to follow for additional needs.

## 2021-05-03 NOTE — Plan of Care (Signed)

## 2021-05-03 NOTE — Progress Notes (Signed)
[place] for heparin Indication: chest pain/ACS  Allergies  Allergen Reactions   Bee Venom Swelling    Severe swelling   Fish Allergy Anaphylaxis    Throat swelling with seafood   Iodinated Diagnostic Agents Anaphylaxis   Latex Anaphylaxis   Penicillins Anaphylaxis    Has patient had a PCN reaction causing immediate rash, facial/tongue/throat swelling, SOB or lightheadedness with hypotension: Yes Has patient had a PCN reaction causing severe rash involving mucus membranes or skin necrosis: No Has patient had a PCN reaction that required hospitalization No Has patient had a PCN reaction occurring within the last 10 years: No If all of the above answers are "NO", then may proceed with Cephalosporin use.     Patient Measurements: Height: 5\' 10"  (177.8 cm) Weight: 106.3 kg (234 lb 6.4 oz) IBW/kg (Calculated) : 73 Heparin Dosing Weight: 95.2kg  Vital Signs: Temp: 97.9 F (36.6 C) (10/16 2005) Temp Source: Oral (10/16 2005) BP: 158/97 (10/16 2005) Pulse Rate: 76 (10/16 2005)  Labs: Recent Labs    04/30/21 1849 04/30/21 2223 05/01/21 0627 05/01/21 1159 05/01/21 1717 05/02/21 0222 05/02/21 0749 05/03/21 0140  HGB  --   --  14.6  --   --  14.4  --  14.9  HCT  --   --  43.3  --   --  44.1  --  44.4  PLT  --   --  171  --   --  179  --  190  HEPARINUNFRC  --    < > 0.42 0.31  --   --  0.47 0.27*  CREATININE  --   --  1.14  --   --  1.25*  --  1.05  TROPONINIHS 399*  --  358*  --  212*  --   --   --    < > = values in this interval not displayed.     Estimated Creatinine Clearance: 97 mL/min (by C-G formula based on SCr of 1.05 mg/dL).   Medical History: Past Medical History:  Diagnosis Date   Acute kidney failure ([place])    after last surgery   Alcohol abuse, in remission 11/03/2015   Aortic root dilation (HCC)    Chronic kidney disease, stage 3a (HCC)    Chronic lower back pain    Cocaine abuse ([place]) 11/03/2015    Depression    GERD (gastroesophageal reflux disease)    occasionally and will take Rolaid if needed   Hemorrhoids    History of kidney stones    History of MRSA infection    several yrs ago   Hypertension    OSA on CPAP    study was done 3 yrs ago.  Wears a cpap   Polysubstance dependence, non-opioid, episodic (HCC)    RSD (reflex sympathetic dystrophy), L foot due to calcaneus fx 08/14/2012   Assessment: 9 YOM presenting with CP, elevated troponin, he is not on anticoagulation PTA. Patient with increasing history of chest pain over last couple of months. R/o atherosclerotic process vs cocaine-induced vasopspasm given patient's recent history of cocaine use. Plan is for cath on Monday. Pharmacy to dose heparin.   Heparin level down to slightly subtherapeutic (0.27) on gtt at 1550 units/hr. No issues with line or bleeding reported per RN. Plan for cath today.  Goal of Therapy:  Heparin level 0.3-0.7 units/ml Monitor platelets by anticoagulation protocol: Yes   Plan:  Increase IV heparin gtt at 1750 units/hr F/u post cath  Thank  you for involving pharmacy in this patient's care.  Sherlon Handing, PharmD, BCPS Please see amion for complete clinical pharmacist phone list 05/03/2021 3:05 AM

## 2021-05-03 NOTE — Interval H&P Note (Signed)
History and Physical Interval Note:  05/03/2021 4:51 PM  [name]  has presented today for surgery, with the diagnosis of NSTEMI.  The various methods of treatment have been discussed with the patient and family. After consideration of risks, benefits and other options for treatment, the patient has consented to  Procedure(s): LEFT HEART CATH AND CORONARY ANGIOGRAPHY (N/A) as a surgical intervention.  The patient's history has been reviewed, patient examined, no change in status, stable for surgery.  I have reviewed the patient's chart and labs.  Questions were answered to the patient's satisfaction.    Cath Lab Visit (complete for each Cath Lab visit)  Clinical Evaluation Leading to the Procedure:   ACS: Yes.    Non-ACS:    Anginal Classification: CCS III  Anti-ischemic medical therapy: Maximal Therapy (2 or more classes of medications)  Non-Invasive Test Results: No non-invasive testing performed  Prior CABG: No previous CABG        Early [place]

## 2021-05-03 NOTE — Progress Notes (Signed)
Primary Cardiologist :  McHalaney   Subjective:  No pain this am    Objective:  Vitals:   05/02/21 0926 05/02/21 1410 05/02/21 2005 05/03/21 0351  BP: (!) 155/99 133/84 (!) 158/97 132/90  Pulse:   76 83  Resp:  16 20 18   Temp:  97.7 F (36.5 C) 97.9 F (36.6 C) 98 F (36.7 C)  TempSrc:  Oral Oral Oral  SpO2:  95% 98% 98%  Weight:    106.3 kg  Height:        Intake/Output from previous day:  Intake/Output Summary (Last 24 hours) at 05/03/2021 1610 Last data filed at 05/03/2021 0600 Gross per 24 hour  Intake 782.92 ml  Output --  Net 782.92 ml    Physical Exam:  Affect appropriate Healthy:  appears stated age HEENT: normal Neck supple with no adenopathy JVP normal no bruits no thyromegaly Lungs clear with no wheezing and good diaphragmatic motion Heart:  S1/S2 no murmur, no rub, gallop or click PMI normal Abdomen: benighn, BS positve, no tenderness, no AAA no bruit.  No HSM or HJR Distal pulses intact with no bruits No edema Neuro non-focal Skin warm and dry No muscular weakness   Lab Results: Basic Metabolic Panel: Recent Labs    05/02/21 0222 05/03/21 0140  NA 139 137  K 4.1 4.2  CL 105 105  CO2 26 23  GLUCOSE 127* 111*  BUN 19 18  CREATININE 1.25* 1.05  CALCIUM 8.7* 9.0   Liver Function Tests: Recent Labs    04/30/21 1849  AST 21  ALT 26  ALKPHOS 107  BILITOT 0.4  PROT 6.6  ALBUMIN 3.7   No results for input(s): LIPASE, AMYLASE in the last 72 hours. CBC: Recent Labs    05/02/21 0222 05/03/21 0140  WBC 7.9 7.6  HGB 14.4 14.9  HCT 44.1 44.4  MCV 91.7 89.3  PLT 179 190   Cardiac Enzymes: No results for input(s): CKTOTAL, CKMB, CKMBINDEX, TROPONINI in the last 72 hours. BNP: Invalid input(s): POCBNP D-Dimer: No results for input(s): DDIMER in the last 72 hours. Hemoglobin A1C: No results for input(s): HGBA1C in the last 72 hours. Fasting Lipid Panel: Recent Labs    05/01/21 0627  CHOL 184  HDL 39*  LDLCALC  129*  TRIG 79  CHOLHDL 4.7   Thyroid Function Tests: No results for input(s): TSH, T4TOTAL, T3FREE, THYROIDAB in the last 72 hours.  Invalid input(s): FREET3 Anemia Panel: No results for input(s): VITAMINB12, FOLATE, FERRITIN, TIBC, IRON, RETICCTPCT in the last 72 hours.  Imaging: Imaging results have been reviewed  Cardiac Studies:  ECG: NSR rate 66 tall R in V1   Telemetry: NSR 05/03/2021   Echo: EF normal 05/22/19   Medications:    amLODipine  10 mg Oral Daily   aspirin EC  81 mg Oral Daily   atorvastatin  80 mg Oral Daily   isosorbide mononitrate  30 mg Oral Daily   nicotine  21 mg Transdermal Daily   sertraline  150 mg Oral Daily   sodium chloride flush  3 mL Intravenous Q12H   sodium chloride flush  3 mL Intravenous Q12H      sodium chloride     sodium chloride     sodium chloride 1 mL/kg/hr (05/03/21 0517)   heparin 1,750 Units/hr (05/03/21 0309)    Assessment/Plan:   NSTEMI:  troponin peak 399 no acute ECG changes Continue heparin nitrates ASA and statin No beta blocker with cocaine  use For cath today Discussed procedure and risks including contrast reaction, stroke, bleeding need for emergency surgery Willing to proceed  Smoking:  continue transdermal patch Depression:  continue sertraline  Substance abuse:  urine tox screen positive for cocaine despite patient indicating its been 9 days since last use   Jenkins Rouge 05/03/2021, 8:26 AM

## 2021-05-03 NOTE — Progress Notes (Signed)
PROGRESS NOTE  [name] UMP:[id number] DOB: 1966-07-18 DOA: 04/30/2021 PCP: Owens Loffler, MD  HPI/Recap of past 24 hours: [name] [name] is a 55 y.o. male with medical history significant of depression, HTN, cocaine abuse, tobacco abuse, CKD stage IIIa, aortic root dilation, and OSA on cpap who presented to ED with chest pain. Cath planned for 10/17 per cards  Assessment/Plan: Principal Problem:   NSTEMI (non-ST elevated myocardial infarction) [facility]) Active Problems:   Major depressive disorder, recurrent episode, moderate (HCC)   HTN (hypertension), benign   Tobacco use disorder   Cocaine abuse ([place])   Prediabetes   Stage 3a chronic kidney disease (CKD) (HCC)   NSTEMI Troponin 210-->399-->358-->212 EKG with no acute ST changes CXR unremarkable Echo done showed EF of 60-65%, no RWMA Cardiology consulted, plan for cardiac cath on 05/03/21 Continue Heparin drip, aspirin, statin, NG prn Avoid beta-blockers in setting of ongoing cocaine abuse  HTN Continue home Norvasc, Imdur added by cardiology  HLD LDL 129 Continue Lipitor  Pre-diabetes   Recent A1c 6.1 on 8/22  Diet modification  Stage 3a chronic kidney disease  Cr at baseline Avoid nephrotoxic drugs  Aortic root dilation Minimally dilated aortic root at 38 mm on last echo in November 2020 Repeat echo with normal aortic root   OSA Continue cpap at night   Polysubstance abuse Tobacco and cocaine abuse UDS positive for cocacine  Avoid BB Encourage cessation Nicotine patch   Major depressive disorder, recurrent episode, moderate (HCC) Followed by psychiatry outpatient Difficult to control depression and SI ideation in the past No SI/HI Continue Zoloft 150 mg daily   Obesity Estimated body mass index is 33.62 kg/m as calculated from the following:   Height as of this encounter: 5\' 10"  (1.778 m).   Weight as of this encounter: 106.3 kg.       Code Status: Full  Family Communication: None at  bedside  Disposition Plan: Status is: Inpatient  Remains inpatient appropriate because: Level of care    Consultants: Cardiology  DVT prophylaxis: Heparin drip    Objective: Vitals:   05/02/21 1410 05/02/21 2005 05/03/21 0351 05/03/21 0800  BP: 133/84 (!) 158/97 132/90 (!) 143/86  Pulse:  76 83 62  Resp: 16 20 18 18   Temp: 97.7 F (36.5 C) 97.9 F (36.6 C) 98 F (36.7 C) 97.9 F (36.6 C)  TempSrc: Oral Oral Oral Oral  SpO2: 95% 98% 98%   Weight:   106.3 kg   Height:        Intake/Output Summary (Last 24 hours) at 05/03/2021 1113 Last data filed at 05/03/2021 0600 Gross per 24 hour  Intake 782.92 ml  Output --  Net 782.92 ml   Filed Weights   04/30/21 1332 04/30/21 1840 05/03/21 0351  Weight: 104.3 kg 106.3 kg 106.3 kg    Exam:  General: Appearance:    Obese male in no acute distress, sleeping     Lungs:     respirations unlabored  Heart:    Normal heart rate. Normal rhythm. No murmurs, rubs, or gallops.    MS:   All extremities are intact.    Neurologic:   sleeping       Data Reviewed: CBC: Recent Labs  Lab 04/30/21 1432 05/01/21 0627 05/02/21 0222 05/03/21 0140  WBC 11.3* 7.8 7.9 7.6  HGB 16.0 14.6 14.4 14.9  HCT 49.0 43.3 44.1 44.4  MCV 91.4 89.8 91.7 89.3  PLT 218 171 179 400   Basic Metabolic Panel: Recent Labs  Lab 04/30/21 1432 05/01/21 0627 05/02/21 0222 05/03/21 0140  NA 140 137 139 137  K 3.5 4.1 4.1 4.2  CL 107 106 105 105  CO2 25 24 26 23   GLUCOSE 121* 101* 127* 111*  BUN 20 18 19 18   CREATININE 1.26* 1.14 1.25* 1.05  CALCIUM 8.9 8.3* 8.7* 9.0   GFR: Estimated Creatinine Clearance: 97 mL/min (by C-G formula based on SCr of 1.05 mg/dL). Liver Function Tests: Recent Labs  Lab 04/30/21 1849  AST 21  ALT 26  ALKPHOS 107  BILITOT 0.4  PROT 6.6  ALBUMIN 3.7   No results for input(s): LIPASE, AMYLASE in the last 168 hours. No results for input(s): AMMONIA in the last 168 hours. Coagulation Profile: No results  for input(s): INR, PROTIME in the last 168 hours. Cardiac Enzymes: No results for input(s): CKTOTAL, CKMB, CKMBINDEX, TROPONINI in the last 168 hours. BNP (last 3 results) No results for input(s): PROBNP in the last 8760 hours. HbA1C: No results for input(s): HGBA1C in the last 72 hours. CBG: No results for input(s): GLUCAP in the last 168 hours. Lipid Profile: Recent Labs    05/01/21 0627  CHOL 184  HDL 39*  LDLCALC 129*  TRIG 79  CHOLHDL 4.7   Thyroid Function Tests: No results for input(s): TSH, T4TOTAL, FREET4, T3FREE, THYROIDAB in the last 72 hours. Anemia Panel: No results for input(s): VITAMINB12, FOLATE, FERRITIN, TIBC, IRON, RETICCTPCT in the last 72 hours. Urine analysis:    Component Value Date/Time   COLORURINE YELLOW 04/26/2019 1344   APPEARANCEUR CLEAR 04/26/2019 1344   LABSPEC 1.021 04/26/2019 1344   PHURINE 5.0 04/26/2019 1344   GLUCOSEU NEGATIVE 04/26/2019 1344   HGBUR NEGATIVE 04/26/2019 1344   BILIRUBINUR NEGATIVE 04/26/2019 1344   BILIRUBINUR negative 06/21/2013 [place] 04/26/2019 1344   PROTEINUR NEGATIVE 04/26/2019 1344   UROBILINOGEN 0.2 09/21/2014 0849   NITRITE NEGATIVE 04/26/2019 1344   LEUKOCYTESUR NEGATIVE 04/26/2019 1344   Sepsis Labs: @LABRCNTIP (procalcitonin:4,lacticidven:4)  ) Recent Results (from the past 240 hour(s))  Resp Panel by RT-PCR (Flu A&B, Covid) Nasopharyngeal Swab     Status: None   Collection Time: 04/30/21  6:10 PM   Specimen: Nasopharyngeal Swab; Nasopharyngeal(NP) swabs in vial transport medium  Result Value Ref Range Status   SARS Coronavirus 2 by RT PCR NEGATIVE NEGATIVE Final    Comment: (NOTE) SARS-CoV-2 target nucleic acids are NOT DETECTED.  The SARS-CoV-2 RNA is generally detectable in upper respiratory specimens during the acute phase of infection. The lowest concentration of SARS-CoV-2 viral copies this assay can detect is 138 copies/mL. A negative result does not preclude  SARS-Cov-2 infection and should not be used as the sole basis for treatment or other patient management decisions. A negative result may occur with  improper specimen collection/handling, submission of specimen other than nasopharyngeal swab, presence of viral mutation(s) within the areas targeted by this assay, and inadequate number of viral copies(<138 copies/mL). A negative result must be combined with clinical observations, patient history, and epidemiological information. The expected result is Negative.  Fact Sheet for Patients:  EntrepreneurPulse.com.au  Fact Sheet for Healthcare Providers:  IncredibleEmployment.be  This test is no t yet approved or cleared by the [place] FDA and  has been authorized for detection and/or diagnosis of SARS-CoV-2 by FDA under an Emergency Use Authorization (EUA). This EUA will remain  in effect (meaning this test can be used) for the duration of the COVID-19 declaration under Section 564(b)(1) of the Act, 21 U.S.C.section  360bbb-3(b)(1), unless the authorization is terminated  or revoked sooner.       Influenza A by PCR NEGATIVE NEGATIVE Final   Influenza B by PCR NEGATIVE NEGATIVE Final    Comment: (NOTE) The Xpert Xpress SARS-CoV-2/FLU/RSV plus assay is intended as an aid in the diagnosis of influenza from Nasopharyngeal swab specimens and should not be used as a sole basis for treatment. Nasal washings and aspirates are unacceptable for Xpert Xpress SARS-CoV-2/FLU/RSV testing.  Fact Sheet for Patients: EntrepreneurPulse.com.au  Fact Sheet for Healthcare Providers: IncredibleEmployment.be  This test is not yet approved or cleared by the [place] FDA and has been authorized for detection and/or diagnosis of SARS-CoV-2 by FDA under an Emergency Use Authorization (EUA). This EUA will remain in effect (meaning this test can be used) for the duration of  the COVID-19 declaration under Section 564(b)(1) of the Act, 21 U.S.C. section 360bbb-3(b)(1), unless the authorization is terminated or revoked.  Performed at [facility], [place] [address], [place], [place] [address]       Studies: No results found.  Scheduled Meds:  amLODipine  10 mg Oral Daily   aspirin EC  81 mg Oral Daily   atorvastatin  80 mg Oral Daily   diphenhydrAMINE  25 mg Intravenous Once   isosorbide mononitrate  30 mg Oral Daily   methylPREDNISolone sodium succinate  125 mg Intravenous Once   nicotine  21 mg Transdermal Daily   sertraline  150 mg Oral Daily   sodium chloride flush  3 mL Intravenous Q12H   sodium chloride flush  3 mL Intravenous Q12H    Continuous Infusions:  sodium chloride     sodium chloride     sodium chloride 1 mL/kg/hr (05/03/21 0517)   famotidine     heparin 1,750 Units/hr (05/03/21 0309)     LOS: 2 days     Geradine Girt, DO [facility]  If 7PM-7AM, please contact night-coverage [email or web address] 05/03/2021, 11:13 AM

## 2021-05-03 NOTE — H&P (View-Only) (Signed)
Primary Cardiologist :  McHalaney   Subjective:  No pain this am    Objective:  Vitals:   05/02/21 0926 05/02/21 1410 05/02/21 2005 05/03/21 0351  BP: (!) 155/99 133/84 (!) 158/97 132/90  Pulse:   76 83  Resp:  16 20 18   Temp:  97.7 F (36.5 C) 97.9 F (36.6 C) 98 F (36.7 C)  TempSrc:  Oral Oral Oral  SpO2:  95% 98% 98%  Weight:    106.3 kg  Height:        Intake/Output from previous day:  Intake/Output Summary (Last 24 hours) at 05/03/2021 7782 Last data filed at 05/03/2021 0600 Gross per 24 hour  Intake 782.92 ml  Output --  Net 782.92 ml    Physical Exam:  Affect appropriate Healthy:  appears stated age HEENT: normal Neck supple with no adenopathy JVP normal no bruits no thyromegaly Lungs clear with no wheezing and good diaphragmatic motion Heart:  S1/S2 no murmur, no rub, gallop or click PMI normal Abdomen: benighn, BS positve, no tenderness, no AAA no bruit.  No HSM or HJR Distal pulses intact with no bruits No edema Neuro non-focal Skin warm and dry No muscular weakness   Lab Results: Basic Metabolic Panel: Recent Labs    05/02/21 0222 05/03/21 0140  NA 139 137  K 4.1 4.2  CL 105 105  CO2 26 23  GLUCOSE 127* 111*  BUN 19 18  CREATININE 1.25* 1.05  CALCIUM 8.7* 9.0   Liver Function Tests: Recent Labs    04/30/21 1849  AST 21  ALT 26  ALKPHOS 107  BILITOT 0.4  PROT 6.6  ALBUMIN 3.7   No results for input(s): LIPASE, AMYLASE in the last 72 hours. CBC: Recent Labs    05/02/21 0222 05/03/21 0140  WBC 7.9 7.6  HGB 14.4 14.9  HCT 44.1 44.4  MCV 91.7 89.3  PLT 179 190   Cardiac Enzymes: No results for input(s): CKTOTAL, CKMB, CKMBINDEX, TROPONINI in the last 72 hours. BNP: Invalid input(s): POCBNP D-Dimer: No results for input(s): DDIMER in the last 72 hours. Hemoglobin A1C: No results for input(s): HGBA1C in the last 72 hours. Fasting Lipid Panel: Recent Labs    05/01/21 0627  CHOL 184  HDL 39*  LDLCALC  129*  TRIG 79  CHOLHDL 4.7   Thyroid Function Tests: No results for input(s): TSH, T4TOTAL, T3FREE, THYROIDAB in the last 72 hours.  Invalid input(s): FREET3 Anemia Panel: No results for input(s): VITAMINB12, FOLATE, FERRITIN, TIBC, IRON, RETICCTPCT in the last 72 hours.  Imaging: Imaging results have been reviewed  Cardiac Studies:  ECG: NSR rate 66 tall R in V1   Telemetry: NSR 05/03/2021   Echo: EF normal 05/22/19   Medications:    amLODipine  10 mg Oral Daily   aspirin EC  81 mg Oral Daily   atorvastatin  80 mg Oral Daily   isosorbide mononitrate  30 mg Oral Daily   nicotine  21 mg Transdermal Daily   sertraline  150 mg Oral Daily   sodium chloride flush  3 mL Intravenous Q12H   sodium chloride flush  3 mL Intravenous Q12H      sodium chloride     sodium chloride     sodium chloride 1 mL/kg/hr (05/03/21 0517)   heparin 1,750 Units/hr (05/03/21 0309)    Assessment/Plan:   NSTEMI:  troponin peak 399 no acute ECG changes Continue heparin nitrates ASA and statin No beta blocker with cocaine  use For cath today Discussed procedure and risks including contrast reaction, stroke, bleeding need for emergency surgery Willing to proceed  Smoking:  continue transdermal patch Depression:  continue sertraline  Substance abuse:  urine tox screen positive for cocaine despite patient indicating its been 9 days since last use   Jenkins Rouge 05/03/2021, 8:26 AM

## 2021-05-04 ENCOUNTER — Other Ambulatory Visit (HOSPITAL_COMMUNITY): Payer: Self-pay

## 2021-05-04 ENCOUNTER — Other Ambulatory Visit: Payer: Self-pay | Admitting: *Deleted

## 2021-05-04 ENCOUNTER — Encounter (HOSPITAL_COMMUNITY): Payer: Self-pay | Admitting: Internal Medicine

## 2021-05-04 DIAGNOSIS — I214 Non-ST elevation (NSTEMI) myocardial infarction: Secondary | ICD-10-CM

## 2021-05-04 DIAGNOSIS — Z955 Presence of coronary angioplasty implant and graft: Secondary | ICD-10-CM

## 2021-05-04 LAB — BASIC METABOLIC PANEL
Anion gap: 7 (ref 5–15)
BUN: 20 mg/dL (ref 6–20)
CO2: 25 mmol/L (ref 22–32)
Calcium: 9.2 mg/dL (ref 8.9–10.3)
Chloride: 104 mmol/L (ref 98–111)
Creatinine, Ser: 1.18 mg/dL (ref 0.61–1.24)
GFR, Estimated: >60 mL/min (ref >60)
Glucose, Bld: 197 mg/dL — ABNORMAL HIGH (ref 70–99)
Potassium: 4.4 mmol/L (ref 3.5–5.1)
Sodium: 136 mmol/L (ref 135–145)

## 2021-05-04 LAB — CBC
HCT: 43.8 % (ref 39.0–52.0)
Hemoglobin: 15 g/dL (ref 13.0–17.0)
MCH: 30.2 pg (ref 26.0–34.0)
MCHC: 34.2 g/dL (ref 30.0–36.0)
MCV: 88.1 fL (ref 80.0–100.0)
Platelets: 206 10*3/uL (ref 150–400)
RBC: 4.97 MIL/uL (ref 4.22–5.81)
RDW: 12.4 % (ref 11.5–15.5)
WBC: 11.8 10*3/uL — ABNORMAL HIGH (ref 4.0–10.5)
nRBC: 0 % (ref 0.0–0.2)

## 2021-05-04 LAB — HEPARIN LEVEL (UNFRACTIONATED): Heparin Unfractionated: <0.1 IU/mL — ABNORMAL LOW (ref 0.30–0.70)

## 2021-05-04 MED ORDER — ATORVASTATIN CALCIUM 80 MG PO TABS
80.0000 mg | ORAL_TABLET | Freq: Every day | ORAL | 2 refills | Status: DC
  Filled 2021-05-04: qty 30, 30d supply, fill #0

## 2021-05-04 MED ORDER — TICAGRELOR 90 MG PO TABS
90.0000 mg | ORAL_TABLET | Freq: Two times a day (BID) | ORAL | Status: DC
  Administered 2021-05-04: 90 mg via ORAL
  Filled 2021-05-04: qty 1

## 2021-05-04 MED ORDER — TICAGRELOR 90 MG PO TABS
90.0000 mg | ORAL_TABLET | Freq: Two times a day (BID) | ORAL | 1 refills | Status: DC
  Filled 2021-05-04: qty 60, 30d supply, fill #0

## 2021-05-04 MED ORDER — ISOSORBIDE MONONITRATE ER 30 MG PO TB24
30.0000 mg | ORAL_TABLET | Freq: Every day | ORAL | 1 refills | Status: DC
  Filled 2021-05-04: qty 30, 30d supply, fill #0

## 2021-05-04 MED ORDER — ASPIRIN 81 MG PO TBEC
81.0000 mg | DELAYED_RELEASE_TABLET | Freq: Every day | ORAL | 2 refills | Status: AC
  Filled 2021-05-04: qty 30, 30d supply, fill #0

## 2021-05-04 MED ORDER — NITROGLYCERIN 0.4 MG SL SUBL
0.4000 mg | SUBLINGUAL_TABLET | SUBLINGUAL | 0 refills | Status: DC | PRN
  Filled 2021-05-04: qty 25, 8d supply, fill #0

## 2021-05-04 MED FILL — Nitroglycerin IV Soln 100 MCG/ML in D5W: INTRA_ARTERIAL | Qty: 10 | Status: AC

## 2021-05-04 NOTE — Plan of Care (Signed)
  Problem: Education: Goal: Knowledge of General Education information will improve Description: Including pain rating scale, medication(s)/side effects and non-pharmacologic comfort measures Outcome: Adequate for Discharge   

## 2021-05-04 NOTE — Progress Notes (Signed)
Discharge instructions (including medications) discussed with and copy provided to patient/caregiver 

## 2021-05-04 NOTE — Progress Notes (Signed)
       Primary Cardiologist :  McHalaney   Subjective:  No pain this am    Objective:  Vitals:   05/03/21 1900 05/03/21 2059 05/04/21 0017 05/04/21 0425  BP: 132/86 (!) 144/84 (!) 169/82 128/71  Pulse: 77 73 77 66  Resp: 19 18 16 19   Temp: 97.6 F (36.4 C) 97.9 F (36.6 C) 98.3 F (36.8 C) 98.3 F (36.8 C)  TempSrc: Oral Oral Oral Oral  SpO2: 94% 98% 94% 96%  Weight:      Height:        Intake/Output from previous day:  Intake/Output Summary (Last 24 hours) at 05/04/2021 0836 Last data filed at 05/03/2021 2200 Gross per 24 hour  Intake 520 ml  Output 0 ml  Net 520 ml    Physical Exam:  Affect appropriate Healthy:  appears stated age HEENT: normal Neck supple with no adenopathy JVP normal no bruits no thyromegaly Lungs clear with no wheezing and good diaphragmatic motion Heart:  S1/S2 no murmur, no rub, gallop or click PMI normal Abdomen: benighn, BS positve, no tenderness, no AAA no bruit.  No HSM or HJR Distal pulses intact with no bruits No edema Neuro non-focal Skin warm and dry No muscular weakness   Lab Results: Basic Metabolic Panel: Recent Labs    05/03/21 0140 05/04/21 0149  NA 137 136  K 4.2 4.4  CL 105 104  CO2 23 25  GLUCOSE 111* 197*  BUN 18 20  CREATININE 1.05 1.18  CALCIUM 9.0 9.2   Liver Function Tests: No results for input(s): AST, ALT, ALKPHOS, BILITOT, PROT, ALBUMIN in the last 72 hours.  No results for input(s): LIPASE, AMYLASE in the last 72 hours. CBC: Recent Labs    05/03/21 0140 05/04/21 0149  WBC 7.6 11.8*  HGB 14.9 15.0  HCT 44.4 43.8  MCV 89.3 88.1  PLT 190 206     Imaging: Imaging results have been reviewed  Cardiac Studies:  ECG: NSR rate 66 tall R in V1   Telemetry: NSR 05/04/2021   Echo: EF normal 05/22/19   Medications:    amLODipine  10 mg Oral Daily   aspirin EC  81 mg Oral Daily   atorvastatin  80 mg Oral Daily   isosorbide mononitrate  30 mg Oral Daily   nicotine  21 mg Transdermal  Daily   sertraline  150 mg Oral Daily   sodium chloride flush  3 mL Intravenous Q12H   sodium chloride flush  3 mL Intravenous Q12H   ticagrelor  90 mg Oral BID      sodium chloride 250 mL (05/04/21 0626)   sodium chloride      Assessment/Plan:   NSTEMI:  troponin peak 399 no acute ECG changes Stent to RCA and ramus yesterday concern for affording Brilinta as he has no insurance and is not working so consider d/c with Plavix 75 mg instead Smoking:  continue transdermal patch Depression:  continue sertraline  Substance abuse:  urine tox screen positive for cocaine despite patient indicating its been 9 days since last use   Will arrange f/u with DR Rowe Robert 05/04/2021, 8:36 AM

## 2021-05-04 NOTE — Care Management (Addendum)
Added TOC pharmacy to profile, please send meds there at DC. Provided patient with Brilinta coupon and brochure as backup for outside pharmacy. Provided patient with [place] patient assistance form. MATCH entered, [facility] pharmacy notified

## 2021-05-04 NOTE — TOC Benefit Eligibility Note (Signed)
Patient Advocate Encounter  Insurance verification completed.    The patient is uninsured  Sanya Kobrin, CPhT Pharmacy Patient Advocate Specialist [place] [facility] Stewardship Team Direct Number: [phone]  Fax: [phone]        

## 2021-05-04 NOTE — Discharge Summary (Signed)
Physician Discharge Summary  Justin YARBRO XBJ:[id number] DOB: Dec 18, 1965 DOA: 04/30/2021  PCP: Owens Loffler, MD  Admit date: 04/30/2021 Discharge date: 05/04/2021  Admitted From: Home Disposition: Home  Recommendations for Outpatient Follow-up:  Follow up with PCP in 1 week with repeat CBC/BMP Outpatient follow-up with cardiology Comply with medications and follow-up Abstain from illicit drug use including cocaine Follow up in ED if symptoms worsen or new appear   Home Health: No Equipment/Devices: None  Discharge Condition: Stable CODE STATUS: Full Diet recommendation: Heart healthy  Brief/Interim Summary: 55 y.o. male with medical history significant of depression, HTN, cocaine abuse, tobacco abuse, CKD stage IIIa, aortic root dilation, and OSA on cpap presented with chest pain.  He was found to have non-STEMI; cardiology was consulted.  Echo showed EF of 60 to 65%.  He underwent cardiac catheterization and stenting to RCA and ramus on 05/03/2021.  Subsequently, he has remained chest pain-free.  Cardiology has cleared the patient for discharge home today.  He will be discharged home today on oral aspirin, Brilinta and statin.  Discharge Diagnoses:   Non-STEMI -Status post cardiac catheterization and stenting to RCA and ramus on 05/03/2021.  Subsequently, he has remained chest pain-free.  Cardiology has cleared the patient for discharge home today.  He will be discharged home today on oral aspirin, Brilinta, Imdur and statin. -Outpatient follow-up with cardiology.  Tobacco abuse Cocaine abuse -Urine drug screen positive for cocaine.  Counseled regarding cessation of tobacco and abstinence from cocaine.  Depression -Continue sertraline  Hyperlipidemia -Continue Lipitor  Hypertension -Continue Norvasc, Imdur.  Outpatient follow-up  Chronically disease stage IIIa -Creatinine at baseline.  Outpatient follow-up  OSA -Continue CPAP at night.  Outpatient  follow-up  Prediabetes -A1c 6.1 recently.  Outpatient follow-up  Obesity -Outpatient follow-up  Discharge Instructions  Discharge Instructions     Amb Referral to Cardiac Rehabilitation   Complete by: As directed    Diagnosis:  Coronary Stents NSTEMI     After initial evaluation and assessments completed: Virtual Based Care may be provided alone or in conjunction with Phase 2 Cardiac Rehab based on patient barriers.: Yes   Ambulatory referral to Cardiology   Complete by: As directed    Diet - low sodium heart healthy   Complete by: As directed    Increase activity slowly   Complete by: As directed       Allergies as of 05/04/2021       Reactions   Bee Venom Swelling   Severe swelling   Fish Allergy Anaphylaxis   Throat swelling with seafood   Iodinated Diagnostic Agents Anaphylaxis   Latex Anaphylaxis   Penicillins Anaphylaxis   Has patient had a PCN reaction causing immediate rash, facial/tongue/throat swelling, SOB or lightheadedness with hypotension: Yes Has patient had a PCN reaction causing severe rash involving mucus membranes or skin necrosis: No Has patient had a PCN reaction that required hospitalization No Has patient had a PCN reaction occurring within the last 10 years: No If all of the above answers are "NO", then may proceed with Cephalosporin use.        Medication List     STOP taking these medications    predniSONE 50 MG tablet Commonly known as: DELTASONE       TAKE these medications    amLODipine 10 MG tablet Commonly known as: NORVASC Take 1 tablet (10 mg total) by mouth daily.   aspirin 81 MG EC tablet Take 1 tablet (81 mg total) by mouth daily. Swallow  whole. Start taking on: May 05, 2021   atorvastatin 80 MG tablet Commonly known as: LIPITOR Take 1 tablet (80 mg total) by mouth daily. Start taking on: May 05, 2021   EPINEPHrine 0.3 mg/0.3 mL Soaj injection Commonly known as: EpiPen 2-Pak Inject 0.3 mLs (0.3 mg  total) into the muscle as needed for anaphylaxis.   fluticasone 50 MCG/ACT nasal spray Commonly known as: FLONASE Place 2 sprays into both nostrils daily as needed for allergies or rhinitis.   isosorbide mononitrate 30 MG 24 hr tablet Commonly known as: IMDUR Take 1 tablet (30 mg total) by mouth daily. Start taking on: May 05, 2021   nitroGLYCERIN 0.4 MG SL tablet Commonly known as: NITROSTAT Place 1 tablet (0.4 mg total) under the tongue every 5 (five) minutes x 3 doses as needed for chest pain.   sertraline 100 MG tablet Commonly known as: ZOLOFT Take 150 mg by mouth daily.   ticagrelor 90 MG Tabs tablet Commonly known as: BRILINTA Take 1 tablet (90 mg total) by mouth 2 (two) times daily.        Follow-up Information     Larsen, Justin Hamman, MD Follow up on 05/10/2021.   Specialties: Family Medicine, Sports Medicine Why: @11 :20 am for hiospital follow up appointment- Office location is under [facility]- New location is [place]. If you cannot make this appointment, please call the office to reschedule. Contact information: [place] [address] [phone]                Allergies  Allergen Reactions   Bee Venom Swelling    Severe swelling   Fish Allergy Anaphylaxis    Throat swelling with seafood   Iodinated Diagnostic Agents Anaphylaxis   Latex Anaphylaxis   Penicillins Anaphylaxis    Has patient had a PCN reaction causing immediate rash, facial/tongue/throat swelling, SOB or lightheadedness with hypotension: Yes Has patient had a PCN reaction causing severe rash involving mucus membranes or skin necrosis: No Has patient had a PCN reaction that required hospitalization No Has patient had a PCN reaction occurring within the last 10 years: No If all of the above answers are "NO", then may proceed with Cephalosporin use.     Consultations: Cardiology   Procedures/Studies: DG Chest 1  View  Result Date: 04/30/2021 CLINICAL DATA:  Chest pain EXAM: CHEST  1 VIEW COMPARISON:  11/15/2016 FINDINGS: The heart size and mediastinal contours are within normal limits. Both lungs are clear. The visualized skeletal structures are unremarkable. IMPRESSION: No active disease. Electronically Signed   By: Kerby Moors M.D.   On: 04/30/2021 14:30   CARDIAC CATHETERIZATION  Result Date: 05/03/2021   Ramus lesion is 80% stenosed.   Mid Cx lesion is 80% stenosed.   Prox LAD lesion is 50% stenosed.   A drug-eluting stent was successfully placed using a STENT ONYX FRONTIER [id number].   A drug-eluting stent was successfully placed using a STENT ONYX FRONTIER [id number].   Post intervention, there is a 0% residual stenosis.   Post intervention, there is a 0% residual stenosis.   LV end diastolic pressure is mildly elevated. 1.  Moderate proximal LAD lesion with RFR of 0.92. 2.  High-grade mid left circumflex lesion treated with 1 drug-eluting stent. 3.  High-grade ostial ramus lesion treated with 1 drug-eluting stent. Recommendations: DAPT for at least 1 year and aggressive medical therapy.   ECHOCARDIOGRAM COMPLETE  Result Date: 05/01/2021    ECHOCARDIOGRAM REPORT  Patient Name:   [name] Date of Exam: 05/01/2021 Medical Rec #:  [id number]    Height:       70.0 in Accession #:    [id number]   Weight:       234.4 lb Date of Birth:  [birth year]     BSA:          2.233 m Patient Age:    55 years     BP:           132/83 mmHg Patient Gender: M            HR:           73 bpm. Exam Location:  Inpatient Procedure: 2D Echo, Cardiac Doppler and Color Doppler Indications:    NSTEMI  History:        Patient has prior history of Echocardiogram examinations, most                 recent 06/01/2019. Signs/Symptoms:Shortness of Breath and Chest                 Pain; Risk Factors:Sleep Apnea.  Sonographer:    Merrie Roof RDCS Referring Phys: [place]  1. Left ventricular ejection fraction, by  estimation, is 60 to 65%. The left ventricle has normal function. The left ventricle has no regional wall motion abnormalities. Left ventricular diastolic parameters were normal.  2. Right ventricular systolic function is normal. The right ventricular size is normal.  3. Left atrial size was moderately dilated.  4. Cannot r/o PFO.  5. The mitral valve is abnormal. No evidence of mitral valve regurgitation. No evidence of mitral stenosis.  6. Calcified non coronary cusp . The aortic valve is tricuspid. There is moderate calcification of the aortic valve. There is moderate thickening of the aortic valve. Aortic valve regurgitation is not visualized. Mild to moderate aortic valve sclerosis/calcification is present, without any evidence of aortic stenosis.  7. The inferior vena cava is normal in size with greater than 50% respiratory variability, suggesting right atrial pressure of 3 mmHg. FINDINGS  Left Ventricle: Left ventricular ejection fraction, by estimation, is 60 to 65%. The left ventricle has normal function. The left ventricle has no regional wall motion abnormalities. The left ventricular internal cavity size was normal in size. There is  no left ventricular hypertrophy. Left ventricular diastolic parameters were normal. Right Ventricle: The right ventricular size is normal. No increase in right ventricular wall thickness. Right ventricular systolic function is normal. Left Atrium: Left atrial size was moderately dilated. Right Atrium: Right atrial size was normal in size. Pericardium: There is no evidence of pericardial effusion. Mitral Valve: The mitral valve is abnormal. There is mild thickening of the mitral valve leaflet(s). There is mild calcification of the mitral valve leaflet(s). Mild mitral annular calcification. No evidence of mitral valve regurgitation. No evidence of mitral valve stenosis. Tricuspid Valve: The tricuspid valve is normal in structure. Tricuspid valve regurgitation is trivial. No  evidence of tricuspid stenosis. Aortic Valve: Calcified non coronary cusp. The aortic valve is tricuspid. There is moderate calcification of the aortic valve. There is moderate thickening of the aortic valve. Aortic valve regurgitation is not visualized. Mild to moderate aortic valve sclerosis/calcification is present, without any evidence of aortic stenosis. Aortic valve mean gradient measures 8.0 mmHg. Aortic valve peak gradient measures 14.3 mmHg. Aortic valve area, by VTI measures 3.02 cm. Pulmonic Valve: The pulmonic valve was normal in structure. Pulmonic valve regurgitation is  trivial. No evidence of pulmonic stenosis. Aorta: The aortic root is normal in size and structure. Venous: The inferior vena cava is normal in size with greater than 50% respiratory variability, suggesting right atrial pressure of 3 mmHg. IAS/Shunts: The interatrial septum was not well visualized.  LEFT VENTRICLE PLAX 2D LVIDd:         5.00 cm   Diastology LVIDs:         3.30 cm   LV e' medial:    7.18 cm/s LV PW:         1.10 cm   LV E/e' medial:  12.4 LV IVS:        1.00 cm   LV e' lateral:   11.50 cm/s LVOT diam:     2.10 cm   LV E/e' lateral: 7.8 LV SV:         107 LV SV Index:   48 LVOT Area:     3.46 cm  RIGHT VENTRICLE RV Basal diam:  3.70 cm LEFT ATRIUM             Index        RIGHT ATRIUM           Index LA diam:        4.50 cm 2.02 cm/m   RA Area:     21.60 cm LA Vol (A2C):   70.9 ml 31.75 ml/m  RA Volume:   58.40 ml  26.15 ml/m LA Vol (A4C):   80.1 ml 35.87 ml/m LA Biplane Vol: 76.8 ml 34.39 ml/m  AORTIC VALVE AV Area (Vmax):    2.88 cm AV Area (Vmean):   2.83 cm AV Area (VTI):     3.02 cm AV Vmax:           189.00 cm/s AV Vmean:          126.000 cm/s AV VTI:            0.356 m AV Peak Grad:      14.3 mmHg AV Mean Grad:      8.0 mmHg LVOT Vmax:         157.00 cm/s LVOT Vmean:        103.000 cm/s LVOT VTI:          0.310 m LVOT/AV VTI ratio: 0.87  AORTA Ao Root diam: 3.50 cm Ao Asc diam:  3.50 cm MITRAL VALVE MV  Area (PHT): 3.30 cm     SHUNTS MV Decel Time: 230 msec     Systemic VTI:  0.31 m MV E velocity: 89.20 cm/s   Systemic Diam: 2.10 cm MV A velocity: 108.00 cm/s MV E/A ratio:  0.83 Jenkins Rouge MD Electronically signed by Jenkins Rouge MD Signature Date/Time: 05/01/2021/2:41:16 PM    Final       Subjective: Patient seen and examined at bedside.  Denies any chest pain, nausea, vomiting or shortness of breath.  Feels okay to go home today.  Discharge Exam: Vitals:   05/04/21 0425 05/04/21 0935  BP: 128/71 (!) 169/93  Pulse: 66   Resp: 19   Temp: 98.3 F (36.8 C)   SpO2: 96%     General: Pt is alert, awake, not in acute distress.  Currently on room air. Cardiovascular: rate controlled, S1/S2 + Respiratory: bilateral decreased breath sounds at bases Abdominal: Soft, NT, ND, bowel sounds + Extremities: no edema, no cyanosis    The results of significant diagnostics from this hospitalization (including imaging, microbiology, ancillary and laboratory) are listed below for  reference.     Microbiology: Recent Results (from the past 240 hour(s))  Resp Panel by RT-PCR (Flu A&B, Covid) Nasopharyngeal Swab     Status: None   Collection Time: 04/30/21  6:10 PM   Specimen: Nasopharyngeal Swab; Nasopharyngeal(NP) swabs in vial transport medium  Result Value Ref Range Status   SARS Coronavirus 2 by RT PCR NEGATIVE NEGATIVE Final    Comment: (NOTE) SARS-CoV-2 target nucleic acids are NOT DETECTED.  The SARS-CoV-2 RNA is generally detectable in upper respiratory specimens during the acute phase of infection. The lowest concentration of SARS-CoV-2 viral copies this assay can detect is 138 copies/mL. A negative result does not preclude SARS-Cov-2 infection and should not be used as the sole basis for treatment or other patient management decisions. A negative result may occur with  improper specimen collection/handling, submission of specimen other than nasopharyngeal swab, presence of viral  mutation(s) within the areas targeted by this assay, and inadequate number of viral copies(<138 copies/mL). A negative result must be combined with clinical observations, patient history, and epidemiological information. The expected result is Negative.  Fact Sheet for Patients:  EntrepreneurPulse.com.au  Fact Sheet for Healthcare Providers:  IncredibleEmployment.be  This test is no t yet approved or cleared by the [place] FDA and  has been authorized for detection and/or diagnosis of SARS-CoV-2 by FDA under an Emergency Use Authorization (EUA). This EUA will remain  in effect (meaning this test can be used) for the duration of the COVID-19 declaration under Section 564(b)(1) of the Act, 21 U.S.C.section 360bbb-3(b)(1), unless the authorization is terminated  or revoked sooner.       Influenza A by PCR NEGATIVE NEGATIVE Final   Influenza B by PCR NEGATIVE NEGATIVE Final    Comment: (NOTE) The Xpert Xpress SARS-CoV-2/FLU/RSV plus assay is intended as an aid in the diagnosis of influenza from Nasopharyngeal swab specimens and should not be used as a sole basis for treatment. Nasal washings and aspirates are unacceptable for Xpert Xpress SARS-CoV-2/FLU/RSV testing.  Fact Sheet for Patients: EntrepreneurPulse.com.au  Fact Sheet for Healthcare Providers: IncredibleEmployment.be  This test is not yet approved or cleared by the [place] FDA and has been authorized for detection and/or diagnosis of SARS-CoV-2 by FDA under an Emergency Use Authorization (EUA). This EUA will remain in effect (meaning this test can be used) for the duration of the COVID-19 declaration under Section 564(b)(1) of the Act, 21 U.S.C. section 360bbb-3(b)(1), unless the authorization is terminated or revoked.  Performed at [facility], [place] [address], [place], [place] [address]      Labs: BNP (last 3 results) No  results for input(s): BNP in the last 8760 hours. Basic Metabolic Panel: Recent Labs  Lab 04/30/21 1432 05/01/21 0627 05/02/21 0222 05/03/21 0140 05/04/21 0149  NA 140 137 139 137 136  K 3.5 4.1 4.1 4.2 4.4  CL 107 106 105 105 104  CO2 25 24 26 23 25   GLUCOSE 121* 101* 127* 111* 197*  BUN 20 18 19 18 20   CREATININE 1.26* 1.14 1.25* 1.05 1.18  CALCIUM 8.9 8.3* 8.7* 9.0 9.2   Liver Function Tests: Recent Labs  Lab 04/30/21 1849  AST 21  ALT 26  ALKPHOS 107  BILITOT 0.4  PROT 6.6  ALBUMIN 3.7   No results for input(s): LIPASE, AMYLASE in the last 168 hours. No results for input(s): AMMONIA in the last 168 hours. CBC: Recent Labs  Lab 04/30/21 1432 05/01/21 9485 05/02/21 0222 05/03/21 0140 05/04/21 0149  WBC 11.3* 7.8 7.9 7.6 11.8*  HGB 16.0 14.6 14.4 14.9 15.0  HCT 49.0 43.3 44.1 44.4 43.8  MCV 91.4 89.8 91.7 89.3 88.1  PLT 218 171 179 190 206   Cardiac Enzymes: No results for input(s): CKTOTAL, CKMB, CKMBINDEX, TROPONINI in the last 168 hours. BNP: Invalid input(s): POCBNP CBG: No results for input(s): GLUCAP in the last 168 hours. D-Dimer No results for input(s): DDIMER in the last 72 hours. Hgb A1c No results for input(s): HGBA1C in the last 72 hours. Lipid Profile No results for input(s): CHOL, HDL, LDLCALC, TRIG, CHOLHDL, LDLDIRECT in the last 72 hours. Thyroid function studies No results for input(s): TSH, T4TOTAL, T3FREE, THYROIDAB in the last 72 hours.  Invalid input(s): FREET3 Anemia work up No results for input(s): VITAMINB12, FOLATE, FERRITIN, TIBC, IRON, RETICCTPCT in the last 72 hours. Urinalysis    Component Value Date/Time   COLORURINE YELLOW 04/26/2019 1344   APPEARANCEUR CLEAR 04/26/2019 1344   LABSPEC 1.021 04/26/2019 1344   PHURINE 5.0 04/26/2019 1344   GLUCOSEU NEGATIVE 04/26/2019 1344   HGBUR NEGATIVE 04/26/2019 1344   BILIRUBINUR NEGATIVE 04/26/2019 1344   BILIRUBINUR negative 06/21/2013 1039   KETONESUR NEGATIVE 04/26/2019  1344   PROTEINUR NEGATIVE 04/26/2019 1344   UROBILINOGEN 0.2 09/21/2014 0849   NITRITE NEGATIVE 04/26/2019 1344   LEUKOCYTESUR NEGATIVE 04/26/2019 1344   Sepsis Labs Invalid input(s): PROCALCITONIN,  WBC,  LACTICIDVEN Microbiology Recent Results (from the past 240 hour(s))  Resp Panel by RT-PCR (Flu A&B, Covid) Nasopharyngeal Swab     Status: None   Collection Time: 04/30/21  6:10 PM   Specimen: Nasopharyngeal Swab; Nasopharyngeal(NP) swabs in vial transport medium  Result Value Ref Range Status   SARS Coronavirus 2 by RT PCR NEGATIVE NEGATIVE Final    Comment: (NOTE) SARS-CoV-2 target nucleic acids are NOT DETECTED.  The SARS-CoV-2 RNA is generally detectable in upper respiratory specimens during the acute phase of infection. The lowest concentration of SARS-CoV-2 viral copies this assay can detect is 138 copies/mL. A negative result does not preclude SARS-Cov-2 infection and should not be used as the sole basis for treatment or other patient management decisions. A negative result may occur with  improper specimen collection/handling, submission of specimen other than nasopharyngeal swab, presence of viral mutation(s) within the areas targeted by this assay, and inadequate number of viral copies(<138 copies/mL). A negative result must be combined with clinical observations, patient history, and epidemiological information. The expected result is Negative.  Fact Sheet for Patients:  EntrepreneurPulse.com.au  Fact Sheet for Healthcare Providers:  IncredibleEmployment.be  This test is no t yet approved or cleared by the [place] FDA and  has been authorized for detection and/or diagnosis of SARS-CoV-2 by FDA under an Emergency Use Authorization (EUA). This EUA will remain  in effect (meaning this test can be used) for the duration of the COVID-19 declaration under Section 564(b)(1) of the Act, 21 U.S.C.section 360bbb-3(b)(1), unless the  authorization is terminated  or revoked sooner.       Influenza A by PCR NEGATIVE NEGATIVE Final   Influenza B by PCR NEGATIVE NEGATIVE Final    Comment: (NOTE) The Xpert Xpress SARS-CoV-2/FLU/RSV plus assay is intended as an aid in the diagnosis of influenza from Nasopharyngeal swab specimens and should not be used as a sole basis for treatment. Nasal washings and aspirates are unacceptable for Xpert Xpress SARS-CoV-2/FLU/RSV testing.  Fact Sheet for Patients: EntrepreneurPulse.com.au  Fact Sheet for Healthcare Providers: IncredibleEmployment.be  This test is not yet approved or  cleared by the [place] and has been authorized for detection and/or diagnosis of SARS-CoV-2 by FDA under an Emergency Use Authorization (EUA). This EUA will remain in effect (meaning this test can be used) for the duration of the COVID-19 declaration under Section 564(b)(1) of the Act, 21 U.S.C. section 360bbb-3(b)(1), unless the authorization is terminated or revoked.  Performed at [facility], [place] [address], [place], [place] [address]      Time coordinating discharge: 35 minutes  SIGNED:   Aline August, MD  [facility] 05/04/2021, 11:58 AM

## 2021-05-04 NOTE — Progress Notes (Signed)
CARDIAC REHAB PHASE I   PRE:  Rate/Rhythm: 50 SR with PVCs  BP:  Sitting: 167/92      SaO2: 97 RA  MODE:  Ambulation: 470 ft   POST:  Rate/Rhythm: 103 ST with PVCs  BP:  Sitting: 189/93    SaO2: 96 RA   Pt ambulated 433ft in hallway independently pushing IV pole with limped gait. Pt denies CP, SOB, or dizziness throughout walk. Pt returned to EOB. Pt educated on importance of ASA, Brilinta, statin, and NTG. Pt given MI book along with heart healthy and diabetic diet. Pt currently not interested in smoking cessation, tip sheet provided. Strongly encouraged drug cessation. Reviewed site care, restrictions, and exercise guidelines. Will refer to CRP II [place].  [id number] Justin Falco, RN BSN 05/04/2021 9:09 AM

## 2021-05-07 ENCOUNTER — Telehealth: Payer: Self-pay

## 2021-05-07 NOTE — Telephone Encounter (Signed)
Transition Care Management Unsuccessful Follow-up Telephone Call  Date of discharge and from where:  05/04/21 from [facility]  Attempts:  1st Attempt  Reason for unsuccessful TCM follow-up call:  Left voice message   Thea Silversmith, RN, MSN, BSN, [place] Care Management Coordinator [phone]

## 2021-05-10 ENCOUNTER — Encounter: Payer: Self-pay | Admitting: Family Medicine

## 2021-05-10 ENCOUNTER — Other Ambulatory Visit: Payer: Self-pay

## 2021-05-10 ENCOUNTER — Ambulatory Visit (INDEPENDENT_AMBULATORY_CARE_PROVIDER_SITE_OTHER): Payer: Self-pay | Admitting: Family Medicine

## 2021-05-10 ENCOUNTER — Telehealth: Payer: Self-pay

## 2021-05-10 VITALS — BP 146/70 | HR 77 | Temp 97.8°F | Ht 69.75 in | Wt 232.5 lb

## 2021-05-10 DIAGNOSIS — I10 Essential (primary) hypertension: Secondary | ICD-10-CM

## 2021-05-10 DIAGNOSIS — N1831 Chronic kidney disease, stage 3a: Secondary | ICD-10-CM

## 2021-05-10 DIAGNOSIS — Z23 Encounter for immunization: Secondary | ICD-10-CM

## 2021-05-10 DIAGNOSIS — I214 Non-ST elevation (NSTEMI) myocardial infarction: Secondary | ICD-10-CM

## 2021-05-10 DIAGNOSIS — Z9889 Other specified postprocedural states: Secondary | ICD-10-CM

## 2021-05-10 DIAGNOSIS — F1410 Cocaine abuse, uncomplicated: Secondary | ICD-10-CM

## 2021-05-10 NOTE — Telephone Encounter (Addendum)
Transition Care Management Unsuccessful Follow-up Telephone Call  Date of discharge and from where:  05/04/21 from [facility]  Attempts:  2nd Attempt  Reason for unsuccessful TCM follow-up call: Patient not available. RNCM left HIPPA compliant message with person answering phone.   Thea Silversmith, RN, MSN, BSN, [place] Care Management Coordinator [phone]
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
## 2021-05-11 ENCOUNTER — Other Ambulatory Visit: Payer: Medicaid Other

## 2021-05-11 ENCOUNTER — Telehealth: Payer: Self-pay

## 2021-05-11 NOTE — Telephone Encounter (Signed)
Transition Care Management Follow-up Telephone Call Date of discharge and from where: 05/04/21 from [facility] How have you been since you were released from the hospital? "I've been doing good" Any questions or concerns? No  Items Reviewed: Did the pt receive and understand the discharge instructions provided? Yes  Medications obtained and verified? Yes  Other? Yes  Any new allergies since your discharge? No  Dietary orders reviewed? Yes Do you have support at home? Yes   Home Care and Equipment/Supplies: Were home health services ordered? no If so, what is the name of the agency? no  Has the agency set up a time to come to the patient's home? not applicable Were any new equipment or medical supplies ordered?  No What is the name of the medical supply agency? Not applicable Were you able to get the supplies/equipment? not applicable Do you have any questions related to the use of the equipment or supplies? No  Functional Questionnaire: (I = Independent and D = Dependent) ADLs:  I   Bathing/Dressing- I  Meal Prep- I  Eating- I  Maintaining continence- I  Transferring/Ambulation- I  Managing Meds- I  Follow up appointments reviewed:  PCP Hospital f/u appt confirmed? Yes  Saw Dr. Frederico Hamman Copland on 05/10/21. [facility] f/u appt confirmed? Yes  Scheduled to see Cardiology, Melina Copa, PA-C on 05/19/21 @ 10:30 am. Are transportation arrangements needed? No  If their condition worsens, is the pt aware to call PCP or go to the Emergency Dept.? Yes Was the patient provided with contact information for the PCP's office or ED? Yes Was to pt encouraged to call back with questions or concerns? Yes    Thea Silversmith, RN, MSN, BSN, [place] [facility] Care Management Coordinator [phone]

## 2021-05-12 ENCOUNTER — Telehealth (HOSPITAL_COMMUNITY): Payer: Self-pay | Admitting: Pharmacist

## 2021-05-12 NOTE — Telephone Encounter (Signed)
LVM

## 2021-05-13 ENCOUNTER — Telehealth (HOSPITAL_COMMUNITY): Payer: Self-pay | Admitting: Pharmacist

## 2021-05-13 ENCOUNTER — Other Ambulatory Visit (INDEPENDENT_AMBULATORY_CARE_PROVIDER_SITE_OTHER): Payer: Self-pay

## 2021-05-13 ENCOUNTER — Other Ambulatory Visit: Payer: Self-pay

## 2021-05-13 DIAGNOSIS — N1831 Chronic kidney disease, stage 3a: Secondary | ICD-10-CM

## 2021-05-13 DIAGNOSIS — Z9889 Other specified postprocedural states: Secondary | ICD-10-CM

## 2021-05-13 DIAGNOSIS — I214 Non-ST elevation (NSTEMI) myocardial infarction: Secondary | ICD-10-CM

## 2021-05-13 NOTE — Telephone Encounter (Signed)
LVM

## 2021-05-14 ENCOUNTER — Telehealth (HOSPITAL_COMMUNITY): Payer: Self-pay | Admitting: Pharmacist

## 2021-05-14 LAB — CBC WITH DIFFERENTIAL/PLATELET
Basophils Absolute: 0 10*3/uL (ref 0.0–0.1)
Basophils Relative: 0.4 % (ref 0.0–3.0)
Eosinophils Absolute: 0.3 10*3/uL (ref 0.0–0.7)
Eosinophils Relative: 4 % (ref 0.0–5.0)
HCT: 47.2 % (ref 39.0–52.0)
Hemoglobin: 15.7 g/dL (ref 13.0–17.0)
Lymphocytes Relative: 25.1 % (ref 12.0–46.0)
Lymphs Abs: 2.1 10*3/uL (ref 0.7–4.0)
MCHC: 33.2 g/dL (ref 30.0–36.0)
MCV: 90 fl (ref 78.0–100.0)
Monocytes Absolute: 0.5 10*3/uL (ref 0.1–1.0)
Monocytes Relative: 5.8 % (ref 3.0–12.0)
Neutro Abs: 5.3 10*3/uL (ref 1.4–7.7)
Neutrophils Relative %: 64.7 % (ref 43.0–77.0)
Platelets: 237 10*3/uL (ref 150.0–400.0)
RBC: 5.25 Mil/uL (ref 4.22–5.81)
RDW: 13.5 % (ref 11.5–15.5)
WBC: 8.2 10*3/uL (ref 4.0–10.5)

## 2021-05-14 LAB — BASIC METABOLIC PANEL
BUN: 21 mg/dL (ref 6–23)
CO2: 24 mEq/L (ref 19–32)
Calcium: 9 mg/dL (ref 8.4–10.5)
Chloride: 104 mEq/L (ref 96–112)
Creatinine, Ser: 1.14 mg/dL (ref 0.40–1.50)
GFR: 72.41 mL/min (ref >60.00)
Glucose, Bld: 164 mg/dL — ABNORMAL HIGH (ref 70–99)
Potassium: 4.1 mEq/L (ref 3.5–5.1)
Sodium: 138 mEq/L (ref 135–145)

## 2021-05-14 NOTE — Telephone Encounter (Signed)
Final attempt
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
## 2021-05-19 ENCOUNTER — Telehealth: Payer: Self-pay

## 2021-05-19 ENCOUNTER — Ambulatory Visit (INDEPENDENT_AMBULATORY_CARE_PROVIDER_SITE_OTHER): Payer: Self-pay | Admitting: Physician Assistant

## 2021-05-19 ENCOUNTER — Encounter (INDEPENDENT_AMBULATORY_CARE_PROVIDER_SITE_OTHER): Payer: Self-pay

## 2021-05-19 ENCOUNTER — Other Ambulatory Visit: Payer: Self-pay

## 2021-05-19 ENCOUNTER — Encounter: Payer: Self-pay | Admitting: Physician Assistant

## 2021-05-19 ENCOUNTER — Telehealth: Payer: Self-pay | Admitting: Physician Assistant

## 2021-05-19 VITALS — BP 147/80 | HR 103 | Ht 69.75 in | Wt 233.6 lb

## 2021-05-19 DIAGNOSIS — F1910 Other psychoactive substance abuse, uncomplicated: Secondary | ICD-10-CM

## 2021-05-19 DIAGNOSIS — I214 Non-ST elevation (NSTEMI) myocardial infarction: Secondary | ICD-10-CM

## 2021-05-19 DIAGNOSIS — I2510 Atherosclerotic heart disease of native coronary artery without angina pectoris: Secondary | ICD-10-CM

## 2021-05-19 DIAGNOSIS — R931 Abnormal findings on diagnostic imaging of heart and coronary circulation: Secondary | ICD-10-CM

## 2021-05-19 DIAGNOSIS — E785 Hyperlipidemia, unspecified: Secondary | ICD-10-CM

## 2021-05-19 DIAGNOSIS — I10 Essential (primary) hypertension: Secondary | ICD-10-CM

## 2021-05-19 MED ORDER — IRBESARTAN 150 MG PO TABS: 150.0000 mg | ORAL_TABLET | Freq: Every day | ORAL | 3 refills | Status: DC

## 2021-05-19 NOTE — Telephone Encounter (Signed)
Form faxed and confirmation received.

## 2021-05-19 NOTE — Patient Instructions (Addendum)
Medication Instructions:  Your physician has recommended you make the following change in your medication:   START Irbesartan 150 mg taking 1 daily    *If you need a refill on your cardiac medications before your next appointment, please call your pharmacy*   Lab Work: 05/27/2021:  COME TO THE OFFICE, ANYTIME AFTER 7:30 A.M. FOR:  BMET  If you have labs (blood work) drawn today and your tests are completely normal, you will receive your results only by: [place] (if you have MyChart) OR A paper copy in the mail If you have any lab test that is abnormal or we need to change your treatment, we will call you to review the results.   Testing/Procedures: None ordered   Follow-Up: At [facility], you and your health needs are our priority.  As part of our continuing mission to provide you with exceptional heart care, we have created designated Provider Care Teams.  These Care Teams include your primary Cardiologist (physician) and Advanced Practice Providers (APPs -  Physician Assistants and Nurse Practitioners) who all work together to provide you with the care you need, when you need it.  We recommend signing up for the patient portal called "MyChart".  Sign up information is provided on this After Visit Summary.  MyChart is used to connect with patients for Virtual Visits (Telemedicine).  Patients are able to view lab/test results, encounter notes, upcoming appointments, etc.  Non-urgent messages can be sent to your provider as well.   To learn more about what you can do with MyChart, go to [email or web address].    Your next appointment:   6 WEEKS  06/28/2021 ARRIVE AT 8:25   The format for your next appointment:   In Person  Provider:   You may see Melina Copa, PA-C or one of the following Advanced Practice Providers on your designated Care Team:     Other Instructions  We need to get a better idea of what your blood pressure is running at home. Here are some  instructions to follow: - I would recommend using a blood pressure cuff that goes on your arm. The wrist ones can be inaccurate. If you're purchasing one for the first time, try to select one that also reports your heart rate because this can be helpful information as well. - To check your blood pressure, choose a time at least 3 hours after taking your blood pressure medicines. If you can sample it at different times of the day, that's great - it might give you more information about how your blood pressure fluctuates. Remain seated in a chair for 5 minutes quietly beforehand, then check it.  - Please record a list of those readings and call us/send in MyChart message with them for our review in 1 week.

## 2021-05-19 NOTE — Telephone Encounter (Signed)
**Note De-Identified  Obfuscation** The pt left his completed [place] asst application for Brilinta at the office.  I have completed the providers page of his application and emailed it to [employer] PA-c's nurse so she can fax it to the number written on the cover letter included or to place in the to be faxed basket in Medical Records to be faxed.

## 2021-05-19 NOTE — Telephone Encounter (Signed)
Will route to Dr. Marlou Porch for input on any specific restrictions given recent NSTEMI/PCI. Patient was doing well at [place], main concern was ongoing cocaine use and working on improving BP but no specific activity restrictions given.

## 2021-05-19 NOTE — Telephone Encounter (Signed)
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 He said he know something asap please.

## 2021-05-20 ENCOUNTER — Telehealth: Payer: Self-pay | Admitting: Physician Assistant

## 2021-05-20 NOTE — Telephone Encounter (Signed)
**Note De-Identified  Obfuscation** I do not know how long a determination will take as [place] and [place] (Pt Asst for Brilinta) is going through changes in their processing of applications and are behind at this time of year (a lot of patients are in their "donut hole") they are extremely busy so it is taking a longer time for a determination.  Normally we will hear something back from them within 2 weeks but I cannot guarantee this.  I called to s/w the pt and to provide him AZ and MEs phone number so he can check the progress of his application from time to time which may help to get a determination sooner but his mother said he is sleeping and she cannot get him to wake up at this time.  He has no DPR on file.  I gave her my name and [place] phone number at [facility] office at [phone] and requested that she have the pt call me back.  She stated that she will.

## 2021-05-20 NOTE — Telephone Encounter (Signed)
Hi Anderson Malta, can you please reach out to [employer] to find out when we will know how much Brilinta will cost [name]? He was given a 30 day rx on 10/18 - he cannot afford this medication if he does not get assistance so we just want to make sure we get an answer whether or not he can get the assistance before his rx runs out in a little less than 2 weeks. If Brilinta is not affordable, I have already spoke with Dr. Marlou Porch to get permission to switch Brilinta to clopidogrel (Plavix). If we need to switch we would employ our usual strategy: a) the patient taking his last planned Brilinta dose the evening of his last day of taking this, then the next day take four 75mg  clopidogrel tablets (300mg  total), then the day after that just begin taking clopidogrel 75mg  once daily. Keep me updated. Thanks.

## 2021-05-20 NOTE — Telephone Encounter (Signed)
Agree. No work restrictions.  Candee Furbish, MD

## 2021-05-20 NOTE — Telephone Encounter (Signed)
Patient states he is returning [name]'s call.

## 2021-05-20 NOTE — Telephone Encounter (Addendum)
**Note De-Identified  Obfuscation** I s/w the pt concerning his [place]st application for Brilinta. I gave him [place] and [place]s phone number so he can call them to check the progress of his application from time to time just to be sure they have all that they need which will help move his application through the process sooner. He verbalized understanding and thanked me for my call.

## 2021-05-21 NOTE — Telephone Encounter (Signed)
Called and made the patient aware of recommendations. He will call back on Monday to report readings.

## 2021-05-21 NOTE — Telephone Encounter (Signed)
Please let pt know no work restrictions at this time per Dr. Marlou Porch. Thank you.

## 2021-05-21 NOTE — Telephone Encounter (Signed)
Agree with taking 1/2 tablet of the irbesartan for now. Follow BP over the weekend and call us back with readings on Monday. If BP drops again, stop irbesartan altogether.

## 2021-05-21 NOTE — Telephone Encounter (Signed)
Called and spoke to patient. Made him aware that per Dr. Marlou Porch there are no work restrictions at this time.   Patient states that he took the irbesartan 150 mg yesterday that was started at this OV with Melina Copa, PA on 11/2. He said that his BP dropped to 94/54 and he felt a little lightheaded. States SBP back up to 140 this AM. I have asked him to cut his irbesartan in half to take 75 mg QD and continue to monitor his BP and Sx. Made patient aware that I would forward to [facility] for further review and recommendation.

## 2021-05-24 ENCOUNTER — Ambulatory Visit: Payer: Medicaid Other | Admitting: Family Medicine

## 2021-05-25 ENCOUNTER — Telehealth: Payer: Self-pay | Admitting: Licensed Clinical Social Worker

## 2021-05-25 MED ORDER — CLOPIDOGREL BISULFATE 75 MG PO TABS
ORAL_TABLET | ORAL | 3 refills | Status: DC
Start: 2021-05-25 — End: 2022-04-14

## 2021-05-25 NOTE — Telephone Encounter (Signed)
Pt will go to pharmacy and find out if he is eligible to get the other free month of Brilinta and will call back and let us know. I went ahead and sent in Plavix 75 mg taking 4 tablets by mouth on day 1 then will take 1 tablet daily after.  Will keep Brilinta on pt's med list as still currently taking.

## 2021-05-25 NOTE — Telephone Encounter (Signed)
Please see other phone note 11/2 for update on Brilinta denial.

## 2021-05-25 NOTE — Telephone Encounter (Signed)
NotedAnderson Malta, please let pt know [facility] denied application for assistance for Brilinta (also known as ticagrelor) and recommended he apply for [place] Medicaid. He SHOULD finish out the prescription that he has because it is good for him to take the whole 30 days. But once he finishes his last dose of this, we will transition to a cheaper blood thinner.  I had already spoke with Dr. Marlou Porch to get permission to switch Brilinta/ticcagrelor to clopidogrel (AKA Plavix). We should employ our usual strategy:  A) the patient should take his last planned Brilinta dose the evening of his last day of taking this  B) then the next morning, take four 75mg  clopidogrel tablets (300mg  total) - no further Brilinta C) then the day after that just begin taking clopidogrel 75mg  one tablet daily going forward  I will also cc his chart to our social worker Raquel Sarna to see if she can offer any assistance to the patient re: [place] Medicaid for future medication needs.

## 2021-05-25 NOTE — Addendum Note (Signed)
Addended by: Gaetano Net on: 05/25/2021 02:29 PM   Modules accepted: Orders
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
## 2021-05-25 NOTE — Telephone Encounter (Signed)
Spoke with pt.  He has been made aware that he was denied for assistance through [place] for Brilinta.  He advised that he still has a Free coupon for 1 more month of Brilinta. I did advise him of the medication change to Plavix, but also made him aware I would check with Melina Copa, PA-C to see if she wants him to take Brilinta 1 more month.  Will await response before sending Plavix to pt's pharmacy.

## 2021-05-25 NOTE — Telephone Encounter (Signed)
**Note De-Identified  Obfuscation** Letter received from [facility] and [place] stating that they have denied the pt asst with Brilinta and recommend that he apply for [facility] Medicaid prior to applying for pt asst with them.

## 2021-05-25 NOTE — Telephone Encounter (Signed)
I do not think the additional free month of Brilinta would be valid but he can try calling his pharmacy to see if they accept it. I highly doubt it, as I have never heard of patients being allowed more than 1 free month. If they will accept it, OK to continue Brilinta 1 more month before transitioning to clopidogrel but I would be very surprised if this was the case.

## 2021-05-27 ENCOUNTER — Other Ambulatory Visit: Payer: Medicaid Other | Admitting: *Deleted

## 2021-05-27 ENCOUNTER — Other Ambulatory Visit: Payer: Self-pay

## 2021-05-27 DIAGNOSIS — F1910 Other psychoactive substance abuse, uncomplicated: Secondary | ICD-10-CM

## 2021-05-27 DIAGNOSIS — I214 Non-ST elevation (NSTEMI) myocardial infarction: Secondary | ICD-10-CM

## 2021-05-27 DIAGNOSIS — I2510 Atherosclerotic heart disease of native coronary artery without angina pectoris: Secondary | ICD-10-CM

## 2021-05-27 DIAGNOSIS — E785 Hyperlipidemia, unspecified: Secondary | ICD-10-CM

## 2021-05-27 DIAGNOSIS — R931 Abnormal findings on diagnostic imaging of heart and coronary circulation: Secondary | ICD-10-CM

## 2021-05-27 DIAGNOSIS — I10 Essential (primary) hypertension: Secondary | ICD-10-CM

## 2021-05-27 LAB — BASIC METABOLIC PANEL
BUN/Creatinine Ratio: 12 (ref 9–20)
BUN: 15 mg/dL (ref 6–24)
CO2: 22 mmol/L (ref 20–29)
Calcium: 8.8 mg/dL (ref 8.7–10.2)
Chloride: 106 mmol/L (ref 96–106)
Creatinine, Ser: 1.25 mg/dL (ref 0.76–1.27)
Glucose: 63 mg/dL — ABNORMAL LOW (ref 70–99)
Potassium: 3.8 mmol/L (ref 3.5–5.2)
Sodium: 143 mmol/L (ref 134–144)
eGFR: 68 mL/min/{1.73_m2} (ref >59)

## 2021-05-28 ENCOUNTER — Telehealth: Payer: Self-pay | Admitting: Physician Assistant

## 2021-05-28 NOTE — Telephone Encounter (Signed)
Per pt is only taking 1/2  of 150 mg tab of Irbesartan Per pt whole tab made B/P to low and is tolerating a 1/2 tab B/P are good not at home so not sure of values ./cy

## 2021-05-28 NOTE — Telephone Encounter (Signed)
Please let pt know BMET stable except blood sugar a little low so don't skip meals. How is BP running on new med?

## 2021-05-28 NOTE — Telephone Encounter (Signed)
Patient returning call for lab results. 

## 2021-05-28 NOTE — Telephone Encounter (Signed)
Please ask him to check his BP regularly and call us with readings next week (I.e. Wednesday).

## 2021-05-31 NOTE — Telephone Encounter (Signed)
Placed call back to pt, left a message for him to call back.

## 2021-05-31 NOTE — Telephone Encounter (Signed)
Thank you for the update and for being so conscientious of the various issues surrounding his BP. Let's see if he is willing to try taking the whole tablet - can even try splitting into 1/2 tablet BID to start - and then get back with [place] in a few days again how is BP is running.

## 2021-05-31 NOTE — Telephone Encounter (Signed)
Pt didn't write down dates when he checked his bp, but the 1st one he does recall was when he was taking the whole Irbesartan, it was 85/42.   Briefly discussed with pt about illicit drug use, to make sure at the time his bp was reading low, that he was clean, and pt states that he has been clean. Did just want to confirm since his bp was so low at the time.  Pt was ok with the question and understands that we are just trying to control his bp the best we can and just important information that was needed.  Bp readings after cutting Irbesartan in 1/2: 132/80 141/86 135/78 132/8/0 171/83  (hadn't had any med when he checked this one) 141/82 (today while on phone)  Pt understands I will route these readings back to Melina Copa, PA-C for advisement on bp and will call back with any medication changes.  Pt verbalized understanding.

## 2021-06-01 MED ORDER — IRBESARTAN 150 MG PO TABS: 150.0000 mg | ORAL_TABLET | Freq: Every day | ORAL | 11 refills | Status: DC

## 2021-06-01 NOTE — Telephone Encounter (Signed)
Follow Up:    Pt was returning a call from [name]., concerning his blood pressure medicine.

## 2021-06-01 NOTE — Telephone Encounter (Signed)
Pt has been made aware to increase the Irbesartan back to a whole tablet.  He is aware if he gets low readings, he can do 1/2 bid.  Pt will call in a few days with some bp readings.

## 2021-06-01 NOTE — Telephone Encounter (Signed)
Returned the call to the pt and left a message for pt to call back.

## 2021-06-04 ENCOUNTER — Telehealth: Payer: Self-pay | Admitting: Physician Assistant

## 2021-06-04 DIAGNOSIS — Z79899 Other long term (current) drug therapy: Secondary | ICD-10-CM

## 2021-06-04 DIAGNOSIS — I2510 Atherosclerotic heart disease of native coronary artery without angina pectoris: Secondary | ICD-10-CM

## 2021-06-04 DIAGNOSIS — I10 Essential (primary) hypertension: Secondary | ICD-10-CM

## 2021-06-04 NOTE — Telephone Encounter (Signed)
Unable to LM on the pts mobile or home numbers.Will try again at a later time.

## 2021-06-04 NOTE — Telephone Encounter (Signed)
Patient calling with BP readings:  11/16: 1:30 pm 134/83  11/17: 2:00 pm 126/79, 7:00 pm 144/81 Today 1:00 pm 136/82

## 2021-06-04 NOTE — Telephone Encounter (Signed)
I am glad to see he is tolerating the higher dose of the blood pressure medicine without anymore low readings. Since his body is still getting used to it, I would have him continue to follow his blood pressure and submit readings in one week. I would have him get a bmet in 1 week as well (since when he originally started the medicine and had the last labs , he only took half dose at first). Thank you!    Pt to have BMET 06/09/21.Marland KitchenMarland Kitchen

## 2021-06-04 NOTE — Telephone Encounter (Signed)
Called pt to inform him that PA is not in office unable to leave a message.  Will route to Melina Copa, PA to review.

## 2021-06-04 NOTE — Telephone Encounter (Signed)
  Pt is returning call [phone]

## 2021-06-09 ENCOUNTER — Other Ambulatory Visit: Payer: Medicaid Other

## 2021-06-16 ENCOUNTER — Telehealth: Payer: Self-pay | Admitting: Physician Assistant

## 2021-06-16 ENCOUNTER — Other Ambulatory Visit: Payer: Medicaid Other | Admitting: *Deleted

## 2021-06-16 ENCOUNTER — Other Ambulatory Visit: Payer: Self-pay

## 2021-06-16 DIAGNOSIS — I2510 Atherosclerotic heart disease of native coronary artery without angina pectoris: Secondary | ICD-10-CM

## 2021-06-16 DIAGNOSIS — I10 Essential (primary) hypertension: Secondary | ICD-10-CM

## 2021-06-16 DIAGNOSIS — Z79899 Other long term (current) drug therapy: Secondary | ICD-10-CM

## 2021-06-16 MED ORDER — ISOSORBIDE MONONITRATE ER 30 MG PO TB24: 30.0000 mg | ORAL_TABLET | Freq: Every day | ORAL | 1 refills | Status: DC

## 2021-06-16 MED ORDER — ATORVASTATIN CALCIUM 80 MG PO TABS: 80.0000 mg | ORAL_TABLET | Freq: Every day | ORAL | 1 refills | Status: DC

## 2021-06-16 NOTE — Telephone Encounter (Signed)
Good afternoon,  [name] is wondering if we are able to refill his isosorbide mononitrate (IMDUR) 30 MG 24 hr tablet & atorvastatin (LIPITOR) 80 MG tablet  These are medicines prescribed by a doctor he saw in the hospital and does not see normally.    Please reach out to him if we are are or unable to refill.  Thank you!

## 2021-06-16 NOTE — Telephone Encounter (Signed)
Returned the call to the pt and he is aware that we have sent in his refills to [place] [place]  Pt was thankful for the response back.

## 2021-06-17 LAB — BASIC METABOLIC PANEL
BUN/Creatinine Ratio: 11 (ref 9–20)
BUN: 14 mg/dL (ref 6–24)
CO2: 23 mmol/L (ref 20–29)
Calcium: 9 mg/dL (ref 8.7–10.2)
Chloride: 105 mmol/L (ref 96–106)
Creatinine, Ser: 1.23 mg/dL (ref 0.76–1.27)
Glucose: 106 mg/dL — ABNORMAL HIGH (ref 70–99)
Potassium: 4.2 mmol/L (ref 3.5–5.2)
Sodium: 139 mmol/L (ref 134–144)
eGFR: 69 mL/min/{1.73_m2} (ref >59)

## 2021-06-23 NOTE — Progress Notes (Signed)
Pt has been made aware of normal result and verbalized understanding.  jw

## 2021-06-25 ENCOUNTER — Encounter: Payer: Self-pay | Admitting: Physician Assistant

## 2021-06-25 DIAGNOSIS — N182 Chronic kidney disease, stage 2 (mild): Secondary | ICD-10-CM | POA: Insufficient documentation
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
## 2021-06-28 ENCOUNTER — Other Ambulatory Visit: Payer: Self-pay

## 2021-06-28 ENCOUNTER — Ambulatory Visit: Payer: Medicaid Other | Admitting: Physician Assistant

## 2021-06-28 ENCOUNTER — Ambulatory Visit (INDEPENDENT_AMBULATORY_CARE_PROVIDER_SITE_OTHER): Payer: Self-pay | Admitting: Physician Assistant

## 2021-06-28 ENCOUNTER — Telehealth: Payer: Self-pay | Admitting: Licensed Clinical Social Worker

## 2021-06-28 ENCOUNTER — Encounter: Payer: Self-pay | Admitting: Physician Assistant

## 2021-06-28 VITALS — BP 128/60 | HR 90 | Ht 70.0 in | Wt 238.2 lb

## 2021-06-28 DIAGNOSIS — R931 Abnormal findings on diagnostic imaging of heart and coronary circulation: Secondary | ICD-10-CM

## 2021-06-28 DIAGNOSIS — E785 Hyperlipidemia, unspecified: Secondary | ICD-10-CM

## 2021-06-28 DIAGNOSIS — M791 Myalgia, unspecified site: Secondary | ICD-10-CM

## 2021-06-28 DIAGNOSIS — I10 Essential (primary) hypertension: Secondary | ICD-10-CM

## 2021-06-28 DIAGNOSIS — R079 Chest pain, unspecified: Secondary | ICD-10-CM

## 2021-06-28 DIAGNOSIS — F1920 Other psychoactive substance dependence, uncomplicated: Secondary | ICD-10-CM

## 2021-06-28 DIAGNOSIS — I25118 Atherosclerotic heart disease of native coronary artery with other forms of angina pectoris: Secondary | ICD-10-CM

## 2021-06-28 DIAGNOSIS — I2510 Atherosclerotic heart disease of native coronary artery without angina pectoris: Secondary | ICD-10-CM

## 2021-06-28 DIAGNOSIS — N182 Chronic kidney disease, stage 2 (mild): Secondary | ICD-10-CM

## 2021-06-28 LAB — CBC
Hematocrit: 45 % (ref 37.5–51.0)
Hemoglobin: 15.5 g/dL (ref 13.0–17.7)
MCH: 29.6 pg (ref 26.6–33.0)
MCHC: 34.4 g/dL (ref 31.5–35.7)
MCV: 86 fL (ref 79–97)
Platelets: 239 10*3/uL (ref 150–450)
RBC: 5.23 x10E6/uL (ref 4.14–5.80)
RDW: 12.7 % (ref 11.6–15.4)
WBC: 8.3 10*3/uL (ref 3.4–10.8)

## 2021-06-28 LAB — COMPREHENSIVE METABOLIC PANEL
ALT: 26 IU/L (ref 0–44)
AST: 19 IU/L (ref 0–40)
Albumin/Globulin Ratio: 1.9 (ref 1.2–2.2)
Albumin: 4.4 g/dL (ref 3.8–4.9)
Alkaline Phosphatase: 129 IU/L — ABNORMAL HIGH (ref 44–121)
BUN/Creatinine Ratio: 11 (ref 9–20)
BUN: 13 mg/dL (ref 6–24)
Bilirubin Total: 0.5 mg/dL (ref 0.0–1.2)
CO2: 25 mmol/L (ref 20–29)
Calcium: 9.2 mg/dL (ref 8.7–10.2)
Chloride: 102 mmol/L (ref 96–106)
Creatinine, Ser: 1.21 mg/dL (ref 0.76–1.27)
Globulin, Total: 2.3 g/dL (ref 1.5–4.5)
Glucose: 102 mg/dL — ABNORMAL HIGH (ref 70–99)
Potassium: 4.1 mmol/L (ref 3.5–5.2)
Sodium: 139 mmol/L (ref 134–144)
Total Protein: 6.7 g/dL (ref 6.0–8.5)
eGFR: 71 mL/min/{1.73_m2} (ref >59)

## 2021-06-28 LAB — LIPID PANEL
Chol/HDL Ratio: 4.8 ratio (ref 0.0–5.0)
Cholesterol, Total: 187 mg/dL (ref 100–199)
HDL: 39 mg/dL — ABNORMAL LOW (ref >39)
LDL Chol Calc (NIH): 128 mg/dL — ABNORMAL HIGH (ref 0–99)
Triglycerides: 112 mg/dL (ref 0–149)
VLDL Cholesterol Cal: 20 mg/dL (ref 5–40)

## 2021-06-28 LAB — MAGNESIUM: Magnesium: 2.3 mg/dL (ref 1.6–2.3)

## 2021-06-28 LAB — CK: Total CK: 188 U/L (ref 41–331)

## 2021-06-28 MED ORDER — ISOSORBIDE MONONITRATE ER 60 MG PO TB24: 60.0000 mg | ORAL_TABLET | Freq: Every day | ORAL | 3 refills | Status: DC

## 2021-06-28 MED ORDER — ATORVASTATIN CALCIUM 40 MG PO TABS: 40.0000 mg | ORAL_TABLET | Freq: Every day | ORAL | 3 refills | Status: DC

## 2021-06-28 NOTE — Patient Instructions (Addendum)
Medication Instructions:  Your physician has recommended you make the following change in your medication:   INCREASE the Isosorbide to 60 mg taking 1 tablet daily.  You can take 2 of the 30 mg tablets to use them up.  I have sent in a new prescription for the 60 mg tablets to [facility].  DECREASE the Atorvastatin to 40 mg taking 1 daily.  You may cut the 80 mg tablets in 1/2 to use them up.  I have sent in a new prescription to [facility] for the 40 mg tablets. .  *If you need a refill on your cardiac medications before your next appointment, please call your pharmacy*   Lab Work: TODAY:  CMET, MAG, LIPID, CBC, & CK LEVEL  If you have labs (blood work) drawn today and your tests are completely normal, you will receive your results only by: [place] (if you have MyChart) OR A paper copy in the mail If you have any lab test that is abnormal or we need to change your treatment, we will call you to review the results.   Testing/Procedures: None ordered   Follow-Up: At [facility], you and your health needs are our priority.  As part of our continuing mission to provide you with exceptional heart care, we have created designated Provider Care Teams.  These Care Teams include your primary Cardiologist (physician) and Advanced Practice Providers (APPs -  Physician Assistants and Nurse Practitioners) who all work together to provide you with the care you need, when you need it.  We recommend signing up for the patient portal called "MyChart".  Sign up information is provided on this After Visit Summary.  MyChart is used to connect with patients for Virtual Visits (Telemedicine).  Patients are able to view lab/test results, encounter notes, upcoming appointments, etc.  Non-urgent messages can be sent to your provider as well.   To learn more about what you can do with MyChart, go to [email or web address].    Your next appointment:   6 month(s)   12/21/2021 arrive at 8:25  The format for  your next appointment:   In Person  Provider:   Candee Furbish, MD  or Melina Copa, PA-C         Other Instructions

## 2021-06-28 NOTE — Telephone Encounter (Signed)
CSW consulted to speak with pt regarding substance abuse concerns and financial barriers to attending cardiac rehab.  CSW called pt home and cellphones and unable to reach- left VM requesting return call.  Jorge Ny, LCSW Clinical Social Worker [facility] Desk#: [phone] Cell#: [phone]

## 2021-06-29 ENCOUNTER — Telehealth: Payer: Self-pay | Admitting: Licensed Clinical Social Worker

## 2021-06-29 NOTE — Telephone Encounter (Signed)
CSW attempted to call pt home and cell phone numbers again- sent text to cell phone to request return call  Jorge Ny, [facility] Desk#: [phone] Cell#: [phone]

## 2021-07-02 ENCOUNTER — Telehealth (HOSPITAL_COMMUNITY): Payer: Self-pay | Admitting: Licensed Clinical Social Worker

## 2021-07-02 NOTE — Telephone Encounter (Signed)
Attempted to call pt to discuss substance abuse resources- unable to reach or leave VM  Jorge Ny, [facility] Desk#: [phone] Cell#: [phone]

## 2021-07-08 ENCOUNTER — Telehealth: Payer: Self-pay

## 2021-07-15 ENCOUNTER — Telehealth: Payer: Medicaid Other | Admitting: Family

## 2021-07-15 ENCOUNTER — Telehealth: Payer: Self-pay | Admitting: *Deleted

## 2021-07-15 DIAGNOSIS — Z79899 Other long term (current) drug therapy: Secondary | ICD-10-CM

## 2021-07-15 NOTE — Telephone Encounter (Signed)
-----   Message from Charlie Pitter, [place] sent at 07/01/2021  7:49 AM EST ----- Would see if you can try calling patient again today (see result note below). LDL is not well controlled and looks the same as before he was even on a cholesterol medicine so need to find out if he had been taking his atorvastatin prior to these lipids being drawn. If he had, needs referral to lipid clinic to discuss additional options because LDL of 128 on atorvastatin 80mg  is still very high. We had to cut it down at recent OV further to 40mg  due to his muscle cramps. But if he has not been taking his atorvastatin for a period of time this would change our plan. If he's been off of it for 2 weeks but still had muscle cramps then that would suggest it was not the statin causing his symptoms, and we could potentially try another statin like rosuvastatin 40mg  with recheck CMET/lipids in 8 weeks.

## 2021-07-16 ENCOUNTER — Encounter: Payer: Self-pay | Admitting: Family

## 2021-07-16 ENCOUNTER — Telehealth: Payer: Self-pay | Admitting: Family

## 2021-07-16 ENCOUNTER — Telehealth (INDEPENDENT_AMBULATORY_CARE_PROVIDER_SITE_OTHER): Payer: Self-pay | Admitting: Family

## 2021-07-16 ENCOUNTER — Other Ambulatory Visit: Payer: Self-pay

## 2021-07-16 ENCOUNTER — Other Ambulatory Visit: Payer: Self-pay | Admitting: Family

## 2021-07-16 VITALS — BP 172/78 | Temp 97.4°F | Ht 70.0 in | Wt 238.0 lb

## 2021-07-16 DIAGNOSIS — U071 COVID-19: Secondary | ICD-10-CM

## 2021-07-16 DIAGNOSIS — I10 Essential (primary) hypertension: Secondary | ICD-10-CM

## 2021-07-16 MED ORDER — MOLNUPIRAVIR EUA 200MG CAPSULE: 4.0000 | ORAL_CAPSULE | Freq: Two times a day (BID) | ORAL | 0 refills | Status: DC

## 2021-07-16 MED ORDER — MOLNUPIRAVIR EUA 200MG CAPSULE: 4.0000 | ORAL_CAPSULE | Freq: Two times a day (BID) | ORAL | 0 refills | Status: AC

## 2021-07-16 NOTE — Telephone Encounter (Signed)
Patient called, [facility] does not have the   molnupiravir EUA (LAGEVRIO) 200 mg CAPS capsule  [id number] [place] [address] [place] Phone:  [phone]  Fax:  [phone]     [facility] [place]

## 2021-07-16 NOTE — Assessment & Plan Note (Signed)
Continue daily blood pressure and continue strict follow-up with a cardiologist as scheduled.  If any chest pain and/or sudden onset or worsening shortness of breath please immediately go to the emergency room

## 2021-07-16 NOTE — Telephone Encounter (Signed)
Patient called and informed where to pick up his medication.

## 2021-07-16 NOTE — Assessment & Plan Note (Signed)
Patient is considered high risk with multiple comorbidities to include hypertension chronic kidney disease and a history of a heart attack in October 2022.  I have prescribed the antiviral molnupiravir to the pharmacy and patient is to take as directed.  Advised of CDC guidelines for self isolation/ ending isolation.  Advised of safe practice guidelines. Symptom Tier reviewed.  Encouraged to monitor for any worsening symptoms; watch for increased shortness of breath, weakness, and signs of dehydration. Advised when to seek emergency care.  Instructed to rest and hydrate well.  Advised to leave the house during recommended isolation period, only if it is necessary to seek medical care
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
## 2021-07-16 NOTE — Patient Instructions (Signed)
The antiviral medication has been sent to your pharmacy please start taking as prescribed.  Continue to closely monitor your blood pressure and continue strict follow-up with your cardiologist in regards to management of this.  Your COVID19 PCR test is POSITIVE.  Let us know right away if any worsening shortness of breath or persistent productive cough or fever.   Follow these current CDC guidelines for self-isolation:  - Stay home for 5 days, starting the day after your symptoms (The first day is really day 0). - If you have no symptoms or your symptoms are resolving after 5 days, you can leave your house. - Continue to wear a mask around others for 5 additional days. **If you have a fever, continue to stay home until your fever resolves for 24 hours without fever-reducing medications.**

## 2021-08-10 ENCOUNTER — Telehealth: Payer: Self-pay | Admitting: Family Medicine

## 2021-08-10 NOTE — Telephone Encounter (Signed)
Patient called in to cancel his visit with Dr Lorelei Pont on 1.25.23, states he is feeling better  Patient is requesting to switch PCPs from Dr. Lorelei Pont to Dr. Damita Dunnings  Please advise if approved or not

## 2021-08-11 ENCOUNTER — Ambulatory Visit: Payer: Medicaid Other | Admitting: Family Medicine

## 2021-08-11 NOTE — Telephone Encounter (Signed)
I need to decline the transfer.  The issue is not the patient or any specific condition.  The issue is my current panel size and I can't take on new patients at this point.  That is why I need to decline the transfer.

## 2021-08-11 NOTE — Telephone Encounter (Signed)
[name] is a really nice guy.  I know that he likes Dr. Damita Dunnings.  In the past, he has had some mental health issues, but he has been seeing psychiatry on the order of years now, and he is doing a lot better.  Substance has been an issue, but he has always been very open about it.  I do not see any significant barriers to a transfer.

## 2021-08-13 ENCOUNTER — Other Ambulatory Visit: Payer: Medicaid Other

## 2021-08-17 ENCOUNTER — Other Ambulatory Visit: Payer: Self-pay | Admitting: Family Medicine

## 2021-08-17 NOTE — Telephone Encounter (Signed)
Sending to [facility] to approve this transfer

## 2021-08-17 NOTE — Telephone Encounter (Signed)
Pt called requesting to switch PCP . Advise him that Dr. Damita Dunnings is not taking new Pt . Only provider that is available is [name] . Pt would like a transfer to [facility] . Please advise

## 2021-08-18 NOTE — Telephone Encounter (Signed)
This is fine patient would need a transfer of care appt (40 mins) with me

## 2021-08-18 NOTE — Telephone Encounter (Signed)
Left message to call back. When patient calls back please schedule TOC 40 minutes. Thank you

## 2021-08-20 NOTE — Telephone Encounter (Signed)
Pt has been scheduled.  °

## 2021-08-26 ENCOUNTER — Other Ambulatory Visit: Payer: Self-pay

## 2021-08-26 ENCOUNTER — Emergency Department (HOSPITAL_COMMUNITY): Payer: Self-pay

## 2021-08-26 ENCOUNTER — Encounter (HOSPITAL_COMMUNITY): Payer: Self-pay | Admitting: Emergency Medicine

## 2021-08-26 ENCOUNTER — Emergency Department (HOSPITAL_COMMUNITY)
Admission: EM | Admit: 2021-08-26 | Discharge: 2021-08-26 | Disposition: A | Discharge status: Home / Self Care | Payer: Self-pay | Attending: Emergency Medicine | Admitting: Emergency Medicine

## 2021-08-26 ENCOUNTER — Other Ambulatory Visit: Payer: Self-pay | Admitting: Student

## 2021-08-26 DIAGNOSIS — R55 Syncope and collapse: Secondary | ICD-10-CM

## 2021-08-26 DIAGNOSIS — R072 Precordial pain: Secondary | ICD-10-CM

## 2021-08-26 DIAGNOSIS — Z91040 Latex allergy status: Secondary | ICD-10-CM | POA: Insufficient documentation

## 2021-08-26 DIAGNOSIS — Z7902 Long term (current) use of antithrombotics/antiplatelets: Secondary | ICD-10-CM | POA: Insufficient documentation

## 2021-08-26 DIAGNOSIS — R079 Chest pain, unspecified: Secondary | ICD-10-CM

## 2021-08-26 DIAGNOSIS — Z20822 Contact with and (suspected) exposure to covid-19: Secondary | ICD-10-CM | POA: Insufficient documentation

## 2021-08-26 DIAGNOSIS — Z7982 Long term (current) use of aspirin: Secondary | ICD-10-CM | POA: Insufficient documentation

## 2021-08-26 LAB — RESP PANEL BY RT-PCR (FLU A&B, COVID) ARPGX2
Influenza A by PCR: NEGATIVE
Influenza B by PCR: NEGATIVE
SARS Coronavirus 2 by RT PCR: NEGATIVE

## 2021-08-26 LAB — BASIC METABOLIC PANEL
Anion gap: 7 (ref 5–15)
BUN: 19 mg/dL (ref 6–20)
CO2: 25 mmol/L (ref 22–32)
Calcium: 8.6 mg/dL — ABNORMAL LOW (ref 8.9–10.3)
Chloride: 108 mmol/L (ref 98–111)
Creatinine, Ser: 1.17 mg/dL (ref 0.61–1.24)
GFR, Estimated: >60 mL/min (ref >60)
Glucose, Bld: 94 mg/dL (ref 70–99)
Potassium: 4 mmol/L (ref 3.5–5.1)
Sodium: 140 mmol/L (ref 135–145)

## 2021-08-26 LAB — CBC
HCT: 42.4 % (ref 39.0–52.0)
Hemoglobin: 13.9 g/dL (ref 13.0–17.0)
MCH: 29.9 pg (ref 26.0–34.0)
MCHC: 32.8 g/dL (ref 30.0–36.0)
MCV: 91.2 fL (ref 80.0–100.0)
Platelets: 197 10*3/uL (ref 150–400)
RBC: 4.65 MIL/uL (ref 4.22–5.81)
RDW: 13.4 % (ref 11.5–15.5)
WBC: 8 10*3/uL (ref 4.0–10.5)
nRBC: 0 % (ref 0.0–0.2)

## 2021-08-26 LAB — TROPONIN I (HIGH SENSITIVITY)
Troponin I (High Sensitivity): 6 ng/L (ref <18)
Troponin I (High Sensitivity): 6 ng/L (ref <18)

## 2021-08-26 MED ORDER — METHYLPREDNISOLONE SODIUM SUCC 125 MG IJ SOLR
125.0000 mg | INTRAMUSCULAR | Status: AC
  Administered 2021-08-26: 125 mg via INTRAVENOUS
  Filled 2021-08-26: qty 2

## 2021-08-26 MED ORDER — NITROGLYCERIN 0.4 MG SL SUBL: 0.4000 mg | SUBLINGUAL_TABLET | SUBLINGUAL | 2 refills | Status: DC | PRN

## 2021-08-26 MED ORDER — IPRATROPIUM-ALBUTEROL 0.5-2.5 (3) MG/3ML IN SOLN
3.0000 mL | Freq: Once | RESPIRATORY_TRACT | Status: AC
  Administered 2021-08-26: 3 mL via RESPIRATORY_TRACT
  Filled 2021-08-26: qty 3

## 2021-08-26 MED ORDER — ISOSORBIDE MONONITRATE ER 60 MG PO TB24: 90.0000 mg | ORAL_TABLET | Freq: Every day | ORAL | 2 refills | Status: DC

## 2021-08-26 NOTE — ED Provider Notes (Signed)
Handoff received from [place] at approximately 1530. Briefly: Hx CAD Cocaine use and Tobacco use.  Last cocaine use 3 days ago  Chest pain since 9p yesterday. Did have syncopal episode yesterday during CP. Also has SOB today.   Received ASA and Nitro earlier today. Which improved the patients pain.  Initial Trop 6 repeat 6. CBC and CMP unremarkable.   Wheezing noted on exam. Duonebs consulted. And Steroids. No formal COPD diagnosis. Cardiology consulted. Pending recommendations.  Physical Exam  BP (!) 115/100    Pulse 71    Temp 98.6 F (37 C) (Oral)    Resp 10    Ht 5\' 10"  (1.778 m)    Wt 104.3 kg    SpO2 95%    BMI 33.00 kg/m    Procedures  Procedures  ED Course / MDM    Medical Decision Making Amount and/or Complexity of Data Reviewed Labs: ordered. Radiology: ordered.  Risk Prescription drug management.   On my reevaluation the patient is visibly agitated and wants to leave the emergency department. However, he agreed to stay and speak with Cardiology.   Cardiology saw the patient and made the recommendations.  They also recommended that the patient be admitted for further observation however the patient declined this.  The patient signed AMA paperwork prior to leaving.  Cardiologist recommendations were provided to the patient and the patient was encouraged to follow-up with either his primary care physician or cardiology as an outpatient.  Return precautions were provided to the patient the patient was discharged AMA.        Zachery Dakins, MD 08/26/21 2248    Carmin Muskrat, MD 08/26/21 [phone]

## 2021-08-26 NOTE — Progress Notes (Signed)
Ordered outpatient Echo and Event Monitor for further evaluation of chest pain and syncope. Please see consult note from today for more information.  Darreld Mclean, PA-C 08/26/2021 5:27 PM
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
## 2021-08-26 NOTE — ED Triage Notes (Signed)
Pt BIB [facility] from home c/o central CP since 9pm last night, worsening since then. Pt found on the ground, states pain was too severe to get up. Pt pale and short of breath. Pain is to mid-chest and radiates to L jaw. Given 324 asa and 1 SL NTG by EMS, pain completely subsided with medication. Hx NSTEMI and stents placed in October 2022, states the pain feels similar to this event. Endorses cocaine use Monday.   EMS VS- 134/79, HR 77, SpO2 97% on 2L

## 2021-08-26 NOTE — ED Notes (Signed)
Pt verbalizes understanding of discharge instructions. Opportunity for questions and answers were provided. Pt discharged from the ED.   ?

## 2021-08-26 NOTE — Discharge Instructions (Signed)
As discussed, although you are leaving in spite of the recommendation to stay for hospitalization, do not hesitate to return here if you develop new, or concerning changes in your condition.  Otherwise, follow-up with our cardiology, Dr. Marlou Porch

## 2021-08-26 NOTE — ED Notes (Signed)
Patient transported to X-ray 

## 2021-08-26 NOTE — ED Provider Notes (Signed)
[facility] EMERGENCY DEPARTMENT Provider Note   CSN: [id number] Arrival date & time: 08/26/21  1154     History  Chief Complaint  Patient presents with   Chest Pain    [name] is a 56 y.o. male.   Chest Pain  Chest pain since 9 PM yesterday evening he states constant but then improved when given nitroglycerin by EMS.  He was also given full dose aspirin.  He states that his pain went away a minute or 2 later.  Patient denies any associated nausea.  Was apparently somewhat pale on EMS arrival.  Was short of breath with EMS but not dyspneic.  Patient describes his pain is sternal although does seem to radiate to his left jaw.  He states he has 2 episodes a week of chest pain but states that this was more significant chest pain.  He lives at home with his mother.  He still uses cocaine last cocaine use was Monday 4 days ago.  He states that his pain is similar to the symptoms he had when he was last admitted for an NSTEMI.  Last stent was placed in October 2022     Home Medications Prior to Admission medications   Medication Sig Start Date End Date Taking? Authorizing Provider  amLODipine (NORVASC) 10 MG tablet Take 1 tablet by mouth once daily 08/17/21   Copland, Frederico Hamman, MD  aspirin 81 MG EC tablet Take 1 tablet (81 mg total) by mouth daily. Swallow whole. 05/05/21   Aline August, MD  atorvastatin (LIPITOR) 40 MG tablet Take 1 tablet (40 mg total) by mouth daily. 06/28/21 09/26/21  Dunn, Nedra Hai, PA-C  clopidogrel (PLAVIX) 75 MG tablet TAKE 4 TABLETS ON DAY 1 THEN THEREFORE TAKE 1 TABLET BY MOUTH DAILY THEREAFTER. 05/25/21   Dunn, Nedra Hai, PA-C  EPINEPHrine (EPIPEN 2-PAK) 0.3 mg/0.3 mL IJ SOAJ injection Inject 0.3 mLs (0.3 mg total) into the muscle as needed for anaphylaxis. 01/31/20   Copland, Frederico Hamman, MD  fluticasone (FLONASE) 50 MCG/ACT nasal spray Place 2 sprays into both nostrils daily as needed for allergies or rhinitis.    [provider]   irbesartan (AVAPRO) 150 MG tablet Take 1 tablet (150 mg total) by mouth daily. 06/01/21   Dunn, Nedra Hai, PA-C  isosorbide mononitrate (IMDUR) 60 MG 24 hr tablet Take 1 tablet (60 mg total) by mouth daily. 06/28/21 09/26/21  Dunn, Nedra Hai, PA-C  nitroGLYCERIN (NITROSTAT) 0.4 MG SL tablet Place 1 tablet (0.4 mg total) under the tongue every 5 (five) minutes x 3 doses as needed for chest pain. 05/04/21   Aline August, MD  sertraline (ZOLOFT) 100 MG tablet Take 150 mg by mouth daily. 12/26/19   [provider]  diphenhydrAMINE (BENADRYL) 25 MG tablet Take 1 tablet (25 mg total) by mouth every 6 (six) hours as needed for up to 5 days. 01/18/20 07/02/20  Sherol Dade E, PA-C      Allergies    Bee venom, Fish allergy, Iodinated contrast media, Latex, and Penicillins    Review of Systems   Review of Systems  Cardiovascular:  Positive for chest pain.   Physical Exam Updated Vital Signs BP (!) 115/100    Pulse 71    Temp 98.6 F (37 C) (Oral)    Resp 10    Ht 5\' 10"  (1.778 m)    Wt 104.3 kg    SpO2 95%    BMI 33.00 kg/m  Physical Exam Vitals and nursing note reviewed.  Constitutional:      General: He is not in acute distress.    Comments: Somewhat disheveled appearing 56 year old male.  No acute distress.  Pleasant, able answer questions appropriate follow commands.  HENT:     Head: Normocephalic and atraumatic.     Nose: Nose normal.  Eyes:     General: No scleral icterus. Cardiovascular:     Rate and Rhythm: Normal rate and regular rhythm.     Pulses: Normal pulses.     Heart sounds: Normal heart sounds.     Comments: Bilateral radial artery pulses 3+ and symmetric Pulmonary:     Effort: Pulmonary effort is normal. No respiratory distress.     Breath sounds: No wheezing.  Abdominal:     Palpations: Abdomen is soft.     Tenderness: There is no abdominal tenderness.  Musculoskeletal:     Cervical back: Normal range of motion.     Right lower leg: No edema.     Left  lower leg: No edema.  Skin:    General: Skin is warm and dry.     Capillary Refill: Capillary refill takes less than 2 seconds.  Neurological:     Mental Status: He is alert. Mental status is at baseline.  Psychiatric:        Mood and Affect: Mood normal.        Behavior: Behavior normal.    ED Results / Procedures / Treatments   Labs (all labs ordered are listed, but only abnormal results are displayed) Labs Reviewed  BASIC METABOLIC PANEL - Abnormal; Notable for the following components:      Result Value   Calcium 8.6 (*)    All other components within normal limits  RESP PANEL BY RT-PCR (FLU A&B, COVID) ARPGX2  CBC  TROPONIN I (HIGH SENSITIVITY)  TROPONIN I (HIGH SENSITIVITY)    EKG EKG Interpretation  Date/Time:  Thursday August 26 2021 12:10:17 EST Ventricular Rate:  71 PR Interval:  151 QRS Duration: 88 QT Interval:  399 QTC Calculation: 434 R Axis:   81 Text Interpretation: Sinus rhythm since last tracing no significant change Confirmed by Malvin Johns [phone]) on 08/26/2021 12:37:05 PM  Radiology DG Chest 2 View  Result Date: 08/26/2021 CLINICAL DATA:  Chest pain. EXAM: CHEST - 2 VIEW COMPARISON:  April 30, 2021. FINDINGS: No consolidation. No visible pleural effusions or pneumothorax. Cardiomediastinal silhouette is within normal limits. IMPRESSION: No evidence of acute cardiopulmonary disease. Electronically Signed   By: Margaretha Sheffield M.D.   On: 08/26/2021 12:33    Procedures Procedures    Medications Ordered in ED Medications  ipratropium-albuterol (DUONEB) 0.5-2.5 (3) MG/3ML nebulizer solution 3 mL (has no administration in time range)  methylPREDNISolone sodium succinate (SOLU-MEDROL) 125 mg/2 mL injection 125 mg (has no administration in time range)    ED Course/ Medical Decision Making/ A&P                           Medical Decision Making Amount and/or Complexity of Data Reviewed Labs: ordered. Radiology: ordered.   This patient  presents to the ED for concern of chest pain, this involves a number of treatment options, and is a complaint that carries with it a high risk of complications and morbidity.  The differential diagnosis includes The emergent causes of chest pain include: Acute coronary syndrome, tamponade, pericarditis/myocarditis, aortic dissection, pulmonary embolism, tension pneumothorax, pneumonia, and esophageal rupture     Co morbidities: Discussed in HPI  Brief History:  Chest pain since 9 PM yesterday evening he states constant but then improved when given nitroglycerin by EMS.  He was also given full dose aspirin.  He states that his pain went away a minute or 2 later.  Patient denies any associated nausea.  Was apparently somewhat pale on EMS arrival.  Was short of breath with EMS but not dyspneic.  Patient describes his pain is sternal although does seem to radiate to his left jaw.  He states he has 2 episodes a week of chest pain but states that this was more significant chest pain.  He lives at home with his mother.  He still uses cocaine last cocaine use was Monday 4 days ago.  He states that his pain is similar to the symptoms he had when he was last admitted for an NSTEMI.  Last stent was placed in October 2022   Physical exam was unremarkable.  Patient is pain-free at this time after 1 dose of nitroglycerin.  BMP CBC and initial troponin unremarkable chest x-ray unremarkable EKG without any evidence of acute ischemia.  Troponin x2 pending.  EMR reviewed including pt PMHx, past surgical history and past visits to ER.   See HPI for more details   Lab Tests:  I ordered and independently interpreted labs.  The pertinent results include:    I personally reviewed all laboratory work and imaging. Metabolic panel without any acute abnormality specifically kidney function within normal limits and no significant electrolyte abnormalities. CBC without leukocytosis or significant  anemia.   Imaging Studies:  NAD. I personally reviewed all imaging studies and no acute abnormality found. I agree with radiology interpretation.    Cardiac Monitoring:  The patient was maintained on a cardiac monitor.  I personally viewed and interpreted the cardiac monitored which showed an underlying rhythm of: NSR  EKG non-ischemic   Medicines ordered:  Patient is symptom-free no medications were administered the patient here in the ER   Critical Interventions:  Cardiac enzymes and EKG as part of chest pain work-up   Consults:      Reevaluation:  After the interventions noted above I re-evaluated patient and found that they have :stayed the same   Social Determinants of Health:  The patient's social determinants of health were a factor in the care of this patient    Problem List / ED Course:  Chest pain   Dispostion:  After consideration of the diagnostic results and the patients response to treatment, I feel that the patent would benefit from consultation with cardiology   Patient is wheezing some on examination.  Will provide with breathing treatment and Solu-Medrol.  I had a lengthy discussion with him and family member at bedside about my concerns given his presentation today with chest pain and syncope.  He is generally adverse to being admitted however states he will consider.  Discussed with Wannetta Sender who will make sure that cardiology provider evaluates patient and makes recommendations.  Pt care transferred to Dr. Vanita Panda 3:27 PM    Final Clinical Impression(s) / ED Diagnoses Final diagnoses:  Chest pain, unspecified type  Syncope, unspecified syncope type    Rx / DC Orders ED Discharge Orders     None         Tedd Sias, [place] 08/26/21 1527    Malvin Johns, MD 08/27/21 [phone]

## 2021-08-27 ENCOUNTER — Ambulatory Visit (INDEPENDENT_AMBULATORY_CARE_PROVIDER_SITE_OTHER): Payer: Self-pay | Admitting: Nurse Practitioner

## 2021-08-27 ENCOUNTER — Encounter: Payer: Self-pay | Admitting: *Deleted

## 2021-08-27 ENCOUNTER — Telehealth: Payer: Self-pay | Admitting: Nurse Practitioner

## 2021-08-27 ENCOUNTER — Encounter: Payer: Self-pay | Admitting: Nurse Practitioner

## 2021-08-27 VITALS — BP 148/94 | HR 82 | Temp 98.1°F | Resp 18 | Ht 70.0 in | Wt 239.2 lb

## 2021-08-27 DIAGNOSIS — R062 Wheezing: Secondary | ICD-10-CM

## 2021-08-27 DIAGNOSIS — F1410 Cocaine abuse, uncomplicated: Secondary | ICD-10-CM

## 2021-08-27 DIAGNOSIS — I10 Essential (primary) hypertension: Secondary | ICD-10-CM

## 2021-08-27 DIAGNOSIS — G4733 Obstructive sleep apnea (adult) (pediatric): Secondary | ICD-10-CM

## 2021-08-27 DIAGNOSIS — Z720 Tobacco use: Secondary | ICD-10-CM

## 2021-08-27 DIAGNOSIS — R0600 Dyspnea, unspecified: Secondary | ICD-10-CM | POA: Insufficient documentation

## 2021-08-27 DIAGNOSIS — R079 Chest pain, unspecified: Secondary | ICD-10-CM

## 2021-08-27 DIAGNOSIS — Z955 Presence of coronary angioplasty implant and graft: Secondary | ICD-10-CM

## 2021-08-27 DIAGNOSIS — R7303 Prediabetes: Secondary | ICD-10-CM

## 2021-08-27 DIAGNOSIS — F331 Major depressive disorder, recurrent, moderate: Secondary | ICD-10-CM

## 2021-08-27 DIAGNOSIS — F1011 Alcohol abuse, in remission: Secondary | ICD-10-CM

## 2021-08-27 DIAGNOSIS — R0602 Shortness of breath: Secondary | ICD-10-CM

## 2021-08-27 MED ORDER — ALBUTEROL SULFATE HFA 108 (90 BASE) MCG/ACT IN AERS: 2.0000 | INHALATION_SPRAY | Freq: Four times a day (QID) | RESPIRATORY_TRACT | 2 refills | Status: DC | PRN

## 2021-08-27 NOTE — Assessment & Plan Note (Signed)
Followed by [facility] in [place].  Patient is unhappy with the care that he is receiving currently on sertraline but does not feel like is effective.  We will reach out to [place] and see about changing medications.  Patient states 1 provider here placed him on medication that helped with mood and pain and would like to see about going back to that medication.  Pending contact with psychiatry

## 2021-08-27 NOTE — Progress Notes (Signed)
Patient ID: [name], male   DOB: [birth year], 56 y.o.   MRN: [id number] Patient enrolled for [employer] to ship a 30 day cardiac event monitor to his home. Letter with instructions and Self Pay Discount option mailed to patient.

## 2021-08-27 NOTE — Assessment & Plan Note (Signed)
Likely multifactorial baseline per patients report.  EKG and labs performed 08/26/2021 in the emergency department.  Patient is to follow-up with cardiology we will send in albuterol inhaler to see if this helps with some of his shortness of breath likely has undiagnosed lung disease

## 2021-08-27 NOTE — Assessment & Plan Note (Signed)
Diagnosed lung disease we will send albuterol inhaler since nebulizer did help while being hospitalized.  Chest x-ray was negative from emergency department on 08/26/2021

## 2021-08-27 NOTE — Assessment & Plan Note (Signed)
Was in remission.  Patient does admit that his last use was Monday, 08/21/2021.  Continue to abstain from cocaine use

## 2021-08-27 NOTE — Assessment & Plan Note (Signed)
Currently still using tobacco products a pack pack and a half daily.  Patient has approximately 40-pack-year history.  Currently uninsured need to try to get patient in for low-dose CT scan once and if he becomes insured.  Encourage patient to stop.  Was written Chantix at 1 point in time but being uninsured he could not afford medication.  We will hold off on Chantix currently as he is uninsured and needs to follow-up with cardiology.

## 2021-08-27 NOTE — Assessment & Plan Note (Signed)
In remission.

## 2021-08-27 NOTE — Assessment & Plan Note (Signed)
History of 2 coronary artery stents.  Followed by cardiology currently on clopidogrel
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
## 2021-08-27 NOTE — Patient Instructions (Signed)
Nice to see you today I sent in an inhaler to help with your breathing I will be in touch with lab results and once I get in touch with your other providers Follow up with me in 3 months, sooner if needed

## 2021-08-27 NOTE — Assessment & Plan Note (Signed)
Currently maintained on isosorbide mononitrate, irbesartan, amlodipine.  Patient has not had medication today as he just recently the hospital.  Encourage patient to check blood pressure at home continue medications as prescribed

## 2021-08-27 NOTE — Telephone Encounter (Signed)
Dr. Marlou Porch,  Saw Mr Gamero in office on 08/27/2021. Was in ED on 02/09-02/10 for chest pain and left AMA. Cardiology was consulted. I wanted to make you aware incase you would like to see him in office sooner than scheduled  Thanks, Tish Frederickson, NP

## 2021-08-27 NOTE — Assessment & Plan Note (Signed)
No chest pain currently in office.  Patient was in the emergency department had a work-up wanted to be admitted for further testing patient declined.  He does have cardiology established we will send message to cardiologist to make sure he has appropriate follow-up as needed

## 2021-08-27 NOTE — Assessment & Plan Note (Signed)
Was diagnosed and seen by Dr. Keturah Barre.  Patient has not been using CPAP for approximate 6 months.  Patient has not had a follow-up with Dr. Annamaria Boots yet.  Discussed the importance of CPAP adherence

## 2021-08-27 NOTE — Assessment & Plan Note (Signed)
History of same check A1c in office today.

## 2021-08-28 LAB — LIPID PANEL
Cholesterol: 164 mg/dL (ref <200)
HDL: 45 mg/dL (ref >=40)
LDL Cholesterol (Calc): 98 mg/dL (calc)
Non-HDL Cholesterol (Calc): 119 mg/dL (calc) (ref <130)
Total CHOL/HDL Ratio: 3.6 (calc) (ref <5.0)
Triglycerides: 116 mg/dL (ref <150)

## 2021-08-28 LAB — HEMOGLOBIN A1C
Hgb A1c MFr Bld: 5.7 % of total Hgb — ABNORMAL HIGH (ref <5.7)
Mean Plasma Glucose: 117 mg/dL
eAG (mmol/L): 6.5 mmol/L

## 2021-08-28 LAB — BRAIN NATRIURETIC PEPTIDE: Brain Natriuretic Peptide: 137 pg/mL — ABNORMAL HIGH (ref <100)

## 2021-08-31 ENCOUNTER — Telehealth: Payer: Self-pay | Admitting: Nurse Practitioner

## 2021-08-31 NOTE — Telephone Encounter (Signed)
Mr. Krupka called in returning [place] phone call

## 2021-09-01 ENCOUNTER — Other Ambulatory Visit: Payer: Self-pay | Admitting: Nurse Practitioner

## 2021-09-01 ENCOUNTER — Telehealth: Payer: Self-pay | Admitting: Nurse Practitioner

## 2021-09-01 DIAGNOSIS — R7989 Other specified abnormal findings of blood chemistry: Secondary | ICD-10-CM

## 2021-09-01 MED ORDER — FUROSEMIDE 20 MG PO TABS
20.0000 mg | ORAL_TABLET | Freq: Every day | ORAL | 0 refills | Status: DC
Start: 2021-09-01 — End: 2021-11-18

## 2021-09-01 NOTE — Telephone Encounter (Signed)
Spoke with patient see lab note.

## 2021-09-01 NOTE — Telephone Encounter (Signed)
-----   Message from [place] sent at 09/01/2021  8:59 AM EST ----- Patient advised. Patient would like to take the fluid pills and re check levels after. Please send medication to [facility], [address]. When does patient need to come back for lab re check?

## 2021-09-01 NOTE — Telephone Encounter (Signed)
[name] and I spoke with patient. Patient's questions were answered

## 2021-09-01 NOTE — Telephone Encounter (Signed)
Pt called wanting to discuss the fluid pills

## 2021-09-01 NOTE — Telephone Encounter (Signed)
Patient advised and lab appointment made for 09/06/21

## 2021-09-01 NOTE — Telephone Encounter (Signed)
Medication sent in for 3 days worth. Can come and get rechecked on Monday for just a lab visit

## 2021-09-02 ENCOUNTER — Telehealth: Payer: Self-pay | Admitting: Nurse Practitioner

## 2021-09-02 NOTE — Telephone Encounter (Signed)
Can we call the patient and inform him that I spoke with Dr. Jacinto Reap at [facility]. I asked about cymbalta and she mentioned that he told her it caused weight gain and increased blood pressure. She also states that he is normally seen every 3 months and it has been longer than that. Can we see if he still have zoloft medication? She mentioned Cymbalta being an option but he will need an office visit with her. She mentioned that his "CC" has expired (not sure what that means) but would need to be done again. Let me know what he says.  Thank

## 2021-09-02 NOTE — Telephone Encounter (Signed)
Spoke with patient. Patient states he does not recall if Cymbalta was the medication that caused the weight gain and increased blood pressure but one of the medications did. He is taking Sertraline 100 mg 2 tablets daily, but patient states this does not seem to work anymore as well. He is not sure what "CC" means. Patient said as he mentioned during his office visit he did not care for Dr B as a doctor and wanted to see [facility] for his medications, patient states he really liked [facility].

## 2021-09-06 ENCOUNTER — Other Ambulatory Visit (INDEPENDENT_AMBULATORY_CARE_PROVIDER_SITE_OTHER): Payer: Self-pay

## 2021-09-06 ENCOUNTER — Other Ambulatory Visit: Payer: Self-pay

## 2021-09-06 DIAGNOSIS — R7989 Other specified abnormal findings of blood chemistry: Secondary | ICD-10-CM

## 2021-09-06 LAB — BASIC METABOLIC PANEL
BUN: 17 mg/dL (ref 6–23)
CO2: 32 mEq/L (ref 19–32)
Calcium: 9.2 mg/dL (ref 8.4–10.5)
Chloride: 102 mEq/L (ref 96–112)
Creatinine, Ser: 1.3 mg/dL (ref 0.40–1.50)
GFR: 61.72 mL/min (ref >60.00)
Glucose, Bld: 86 mg/dL (ref 70–99)
Potassium: 3.5 mEq/L (ref 3.5–5.1)
Sodium: 139 mEq/L (ref 135–145)

## 2021-09-07 ENCOUNTER — Telehealth: Payer: Self-pay | Admitting: Nurse Practitioner

## 2021-09-07 ENCOUNTER — Encounter: Payer: Self-pay | Admitting: Cardiology

## 2021-09-07 ENCOUNTER — Other Ambulatory Visit: Payer: Medicaid Other | Admitting: *Deleted

## 2021-09-07 ENCOUNTER — Ambulatory Visit (INDEPENDENT_AMBULATORY_CARE_PROVIDER_SITE_OTHER): Payer: Self-pay | Admitting: Cardiology

## 2021-09-07 ENCOUNTER — Ambulatory Visit (HOSPITAL_COMMUNITY): Payer: Self-pay | Attending: Student

## 2021-09-07 VITALS — BP 130/60 | HR 87 | Ht 70.0 in | Wt 233.0 lb

## 2021-09-07 DIAGNOSIS — Z01812 Encounter for preprocedural laboratory examination: Secondary | ICD-10-CM

## 2021-09-07 DIAGNOSIS — R079 Chest pain, unspecified: Secondary | ICD-10-CM | POA: Insufficient documentation

## 2021-09-07 DIAGNOSIS — I25119 Atherosclerotic heart disease of native coronary artery with unspecified angina pectoris: Secondary | ICD-10-CM | POA: Insufficient documentation

## 2021-09-07 DIAGNOSIS — N182 Chronic kidney disease, stage 2 (mild): Secondary | ICD-10-CM

## 2021-09-07 DIAGNOSIS — R55 Syncope and collapse: Secondary | ICD-10-CM | POA: Insufficient documentation

## 2021-09-07 DIAGNOSIS — Z79899 Other long term (current) drug therapy: Secondary | ICD-10-CM

## 2021-09-07 DIAGNOSIS — F331 Major depressive disorder, recurrent, moderate: Secondary | ICD-10-CM

## 2021-09-07 DIAGNOSIS — Z01818 Encounter for other preprocedural examination: Secondary | ICD-10-CM

## 2021-09-07 LAB — ECHOCARDIOGRAM COMPLETE
AR max vel: 1.56 cm2
AV Area VTI: 1.96 cm2
AV Area mean vel: 1.85 cm2
AV Mean grad: 10 mmHg
AV Peak grad: 20.4 mmHg
Ao pk vel: 2.26 m/s
Area-P 1/2: 2.5 cm2
Height: 70 in
S' Lateral: 3.1 cm
Weight: 3728 oz

## 2021-09-07 LAB — LIPID PANEL
Cholesterol: 144 mg/dL (ref 0–200)
HDL: 37 mg/dL — ABNORMAL LOW (ref >40)
LDL Cholesterol: 73 mg/dL (ref 0–99)
Total CHOL/HDL Ratio: 3.9 RATIO
Triglycerides: 170 mg/dL — ABNORMAL HIGH (ref <150)
VLDL: 34 mg/dL (ref 0–40)

## 2021-09-07 LAB — LDL CHOLESTEROL, DIRECT: Direct LDL: 89.4 mg/dL (ref 0–99)

## 2021-09-07 MED ORDER — PREDNISONE 50 MG PO TABS: ORAL_TABLET | ORAL | 0 refills | Status: DC

## 2021-09-07 NOTE — Telephone Encounter (Signed)
Pt returning your call

## 2021-09-07 NOTE — Patient Instructions (Signed)
Medication Instructions:  The current medical regimen is effective;  continue present plan and medications.  *If you need a refill on your cardiac medications before your next appointment, please call your pharmacy*   Lab Work: Please have blood work today (CBC, BMP)  If you have labs (blood work) drawn today and your tests are completely normal, you will receive your results only by: MyChart Message (if you have [place]) OR A paper copy in the mail If you have any lab test that is abnormal or we need to change your treatment, we will call you to review the results.   Testing/Procedures:   [facility] OFFICE [place], [address] [place] [address] Dept: [phone] Loc: [phone]  PRECILIANO CASTELL  09/07/2021  You are scheduled for a Cardiac Catheterization on Wednesday, March 1 with Dr. Lenna Sciara.  1. Please arrive at the [facility] (Main Entrance A) at [facility]: [address] [place],  [address] at 8:00 AM (two hours before your procedure to ensure your preparation). Free valet parking service is available.   Special note: Every effort is made to have your procedure done on time. Please understand that emergencies sometimes delay scheduled procedures.  2. Diet: Do not eat or drink anything after midnight prior to your procedure except sips of water to take medications.  3. Labs:  today (CBC, BMP)  4. Medication instructions in preparation for your procedure:  Please do not take your Furosemide this AM  On the morning of your procedure, take your Aspirin, Plavix and any morning medicines NOT listed above.  You may use sips of water.  Please take Prednisone 50 mg - 13 hours before, 7 hours before and 1 hour before your cardiac cath.  Also take Benadryl 50 mg 1 hour before.  5. Plan for one night stay--bring personal belongings. 6. Bring a current list of your  medications and current insurance cards. 7. You MUST have a responsible person to drive you home. 8. Someone MUST be with you the first 24 hours after you arrive home or your discharge will be delayed. 9. Please wear clothes that are easy to get on and off and wear slip-on shoes.  Thank you for allowing [place] to care for you!   -- [facility]  Follow-Up: At [facility], you and your health needs are our priority.  As part of our continuing mission to provide you with exceptional heart care, we have created designated Provider Care Teams.  These Care Teams include your primary Cardiologist (physician) and Advanced Practice Providers (APPs -  Physician Assistants and Nurse Practitioners) who all work together to provide you with the care you need, when you need it.  We recommend signing up for the patient portal called "MyChart".  Sign up information is provided on this After Visit Summary.  MyChart is used to connect with patients for Virtual Visits (Telemedicine).  Patients are able to view lab/test results, encounter notes, upcoming appointments, etc.  Non-urgent messages can be sent to your provider as well.   To learn more about what you can do with MyChart, go to [email or web address].    Your next appointment:   1-2  month(s)  The format for your next appointment:   In Person  Provider:   Candee Furbish, MD or PA/NP   Thank you for choosing [facility]!!
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
## 2021-09-07 NOTE — Assessment & Plan Note (Addendum)
Left heart catheterization 04/2021- ramus and mid circumflex stenting (2.75 x 18, 3.5 x 15).  He is also on isosorbide 90 mg.  He is still taking nitroglycerin.  Still having chest discomfort.  Thankfully troponin was normal.  Echocardiogram is pending for today.  I think checking a heart catheterization makes sense to make sure that there is no evidence of restenosis or anatomy changes.  Risk and benefits including stroke heart attack death renal impairment bleeding are discussed.  Willing to proceed.  He will be premedicated for contrast allergy.  He did well with prior procedure.

## 2021-09-07 NOTE — Assessment & Plan Note (Signed)
Hydrate well prior to cath.  He is on irbesartan 150 mg for renal protection.

## 2021-09-07 NOTE — Assessment & Plan Note (Signed)
He did not wear the ZIO.  He will return it.  Prior near syncope could have been vagal like component/anxiety.

## 2021-09-07 NOTE — Telephone Encounter (Signed)
We need to wean him down some prior to switching him to cymbalta. If he is taking 2 100mg  tablets of the sertaline can he start taking 1.5 tablets for a week and then go to 1 tablet for a week after that. After being on 1 tablet for a week we can switch him to cymbalta 60mg 
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
## 2021-09-07 NOTE — Telephone Encounter (Signed)
Called and left message with patient's mom to call me back.

## 2021-09-07 NOTE — Addendum Note (Signed)
Addended by: Shellia Cleverly on: 09/07/2021 01:54 PM   Modules accepted: Orders

## 2021-09-08 ENCOUNTER — Other Ambulatory Visit: Payer: Self-pay

## 2021-09-08 DIAGNOSIS — E785 Hyperlipidemia, unspecified: Secondary | ICD-10-CM

## 2021-09-08 DIAGNOSIS — Z79899 Other long term (current) drug therapy: Secondary | ICD-10-CM

## 2021-09-08 DIAGNOSIS — I25119 Atherosclerotic heart disease of native coronary artery with unspecified angina pectoris: Secondary | ICD-10-CM

## 2021-09-08 LAB — CBC
Hematocrit: 44.4 % (ref 37.5–51.0)
Hemoglobin: 15.2 g/dL (ref 13.0–17.7)
MCH: 30.2 pg (ref 26.6–33.0)
MCHC: 34.2 g/dL (ref 31.5–35.7)
MCV: 88 fL (ref 79–97)
Platelets: 214 10*3/uL (ref 150–450)
RBC: 5.03 x10E6/uL (ref 4.14–5.80)
RDW: 13 % (ref 11.6–15.4)
WBC: 8.7 10*3/uL (ref 3.4–10.8)

## 2021-09-08 MED ORDER — ROSUVASTATIN CALCIUM 20 MG PO TABS: 20.0000 mg | ORAL_TABLET | Freq: Every day | ORAL | 3 refills | Status: DC

## 2021-09-08 NOTE — Telephone Encounter (Signed)
See other phone note, patient called back 09/07/21 but I was out of the office. Called both home and cell number, asked patient's mom to have patient call me back

## 2021-09-08 NOTE — Telephone Encounter (Signed)
See other phone note please.

## 2021-09-09 ENCOUNTER — Other Ambulatory Visit: Payer: Self-pay | Admitting: Cardiology

## 2021-09-09 DIAGNOSIS — R931 Abnormal findings on diagnostic imaging of heart and coronary circulation: Secondary | ICD-10-CM

## 2021-09-10 NOTE — Telephone Encounter (Signed)
Called cell phone but not able to leave a message, called home number again and left message with patient's mom, to have patient call me back

## 2021-09-10 NOTE — Telephone Encounter (Signed)
Spoke with patient. Patient wrote down weaning schedule, he is taking 100 mg 2 tablets of Zoloft right now. Please send RX for Cymbalta to  [facility] [address]  [place] [address]

## 2021-09-13 ENCOUNTER — Telehealth: Payer: Self-pay | Admitting: *Deleted

## 2021-09-13 NOTE — Telephone Encounter (Signed)
Cardiac catheterization scheduled at [facility] for: Wednesday September 15, 2021 [facility] Main Entrance A [facility]) at: 8 AM   Diet-no solid food after midnight prior to cath, clear liquids until 5 AM day of procedure.  CONTRAST ALLERGY: 13 hour Prednisone and Benadryl Prep reviewed with patient: 09/14/21 Prednisone 50 mg 9 PM 09/15/21 Prednisone 50 mg 3 AM 09/15/21 Prednisone 50 mg and Benadryl 50 mg just prior to leaving home for hospital 09/15/21. Pt advised not to drive  Medication instructions for procedure: -Hold:  Lasix-AM of procedure -Except hold medications usual morning medications can be taken pre-cath with sips of water including aspirin 81 mg, Plavix 75 mg, Prednisone and Benadryl.    Must have responsible adult to drive home post procedure and be with patient first 24 hours after arriving home.  [facility] does allow one visitor to wait in the waiting room during the time you are there.   Patient reports does not currently have any new symptoms concerning for COVID-19 and no household members with COVID-19 like illness.     Reviewed procedure instructions with patient.

## 2021-09-15 ENCOUNTER — Encounter (HOSPITAL_COMMUNITY)
Admission: RE | Disposition: A | Discharge status: Home / Self Care | Payer: Self-pay | Source: Home / Self Care | Attending: Internal Medicine

## 2021-09-15 ENCOUNTER — Encounter (HOSPITAL_COMMUNITY): Payer: Self-pay | Admitting: Internal Medicine

## 2021-09-15 ENCOUNTER — Other Ambulatory Visit: Payer: Self-pay | Admitting: Nurse Practitioner

## 2021-09-15 ENCOUNTER — Ambulatory Visit (HOSPITAL_COMMUNITY)
Admission: RE | Admit: 2021-09-15 | Discharge: 2021-09-15 | Disposition: A | Discharge status: Home / Self Care | Payer: Medicaid Other | Attending: Internal Medicine | Admitting: Internal Medicine

## 2021-09-15 ENCOUNTER — Other Ambulatory Visit: Payer: Self-pay

## 2021-09-15 ENCOUNTER — Telehealth: Payer: Self-pay

## 2021-09-15 DIAGNOSIS — F17210 Nicotine dependence, cigarettes, uncomplicated: Secondary | ICD-10-CM | POA: Insufficient documentation

## 2021-09-15 DIAGNOSIS — Z955 Presence of coronary angioplasty implant and graft: Secondary | ICD-10-CM | POA: Insufficient documentation

## 2021-09-15 DIAGNOSIS — R931 Abnormal findings on diagnostic imaging of heart and coronary circulation: Secondary | ICD-10-CM

## 2021-09-15 DIAGNOSIS — I25119 Atherosclerotic heart disease of native coronary artery with unspecified angina pectoris: Secondary | ICD-10-CM | POA: Insufficient documentation

## 2021-09-15 DIAGNOSIS — G4733 Obstructive sleep apnea (adult) (pediatric): Secondary | ICD-10-CM | POA: Insufficient documentation

## 2021-09-15 DIAGNOSIS — F1411 Cocaine abuse, in remission: Secondary | ICD-10-CM | POA: Insufficient documentation

## 2021-09-15 DIAGNOSIS — F338 Other recurrent depressive disorders: Secondary | ICD-10-CM | POA: Insufficient documentation

## 2021-09-15 DIAGNOSIS — I252 Old myocardial infarction: Secondary | ICD-10-CM | POA: Insufficient documentation

## 2021-09-15 DIAGNOSIS — N182 Chronic kidney disease, stage 2 (mild): Secondary | ICD-10-CM | POA: Insufficient documentation

## 2021-09-15 DIAGNOSIS — F331 Major depressive disorder, recurrent, moderate: Secondary | ICD-10-CM

## 2021-09-15 DIAGNOSIS — I129 Hypertensive chronic kidney disease with stage 1 through stage 4 chronic kidney disease, or unspecified chronic kidney disease: Secondary | ICD-10-CM | POA: Insufficient documentation

## 2021-09-15 HISTORY — PX: LEFT HEART CATH AND CORONARY ANGIOGRAPHY: CATH118249

## 2021-09-15 SURGERY — LEFT HEART CATH AND CORONARY ANGIOGRAPHY
Anesthesia: LOCAL

## 2021-09-15 MED ORDER — ASPIRIN 81 MG PO CHEW: 81.0000 mg | CHEWABLE_TABLET | ORAL | Status: DC

## 2021-09-15 MED ORDER — MIDAZOLAM HCL 2 MG/2ML IJ SOLN
INTRAMUSCULAR | Status: AC
  Filled 2021-09-15: qty 2

## 2021-09-15 MED ORDER — HEPARIN (PORCINE) IN NACL 1000-0.9 UT/500ML-% IV SOLN
INTRAVENOUS | Status: AC
  Filled 2021-09-15: qty 500

## 2021-09-15 MED ORDER — SODIUM CHLORIDE 0.9% FLUSH: 3.0000 mL | Freq: Two times a day (BID) | INTRAVENOUS | Status: DC

## 2021-09-15 MED ORDER — VERAPAMIL HCL 2.5 MG/ML IV SOLN
INTRAVENOUS | Status: DC | PRN
  Administered 2021-09-15: 10 mL via INTRA_ARTERIAL

## 2021-09-15 MED ORDER — FENTANYL CITRATE (PF) 100 MCG/2ML IJ SOLN
INTRAMUSCULAR | Status: DC | PRN
  Administered 2021-09-15: 25 ug via INTRAVENOUS
  Administered 2021-09-15: 50 ug via INTRAVENOUS

## 2021-09-15 MED ORDER — CLOPIDOGREL BISULFATE 75 MG PO TABS
75.0000 mg | ORAL_TABLET | Freq: Once | ORAL | Status: AC
  Administered 2021-09-15: 75 mg via ORAL
  Filled 2021-09-15: qty 1

## 2021-09-15 MED ORDER — SODIUM CHLORIDE 0.9 % WEIGHT BASED INFUSION
3.0000 mL/kg/h | INTRAVENOUS | Status: AC
  Administered 2021-09-15: 3 mL/kg/h via INTRAVENOUS

## 2021-09-15 MED ORDER — IOHEXOL 350 MG/ML SOLN
INTRAVENOUS | Status: DC | PRN
  Administered 2021-09-15: 80 mL

## 2021-09-15 MED ORDER — SERTRALINE HCL 100 MG PO TABS: 100.0000 mg | ORAL_TABLET | Freq: Every day | ORAL | 0 refills | Status: DC

## 2021-09-15 MED ORDER — MIDAZOLAM HCL 2 MG/2ML IJ SOLN
INTRAMUSCULAR | Status: DC | PRN
  Administered 2021-09-15 (×2): 1 mg via INTRAVENOUS

## 2021-09-15 MED ORDER — SODIUM CHLORIDE 0.9% FLUSH: 3.0000 mL | INTRAVENOUS | Status: DC | PRN

## 2021-09-15 MED ORDER — LIDOCAINE HCL (PF) 1 % IJ SOLN
INTRAMUSCULAR | Status: DC | PRN
  Administered 2021-09-15: 2 mL via INTRADERMAL

## 2021-09-15 MED ORDER — FENTANYL CITRATE (PF) 100 MCG/2ML IJ SOLN
INTRAMUSCULAR | Status: AC
  Filled 2021-09-15: qty 2

## 2021-09-15 MED ORDER — VERAPAMIL HCL 2.5 MG/ML IV SOLN
INTRAVENOUS | Status: AC
  Filled 2021-09-15: qty 2

## 2021-09-15 MED ORDER — HEPARIN SODIUM (PORCINE) 1000 UNIT/ML IJ SOLN
INTRAMUSCULAR | Status: DC | PRN
  Administered 2021-09-15: 5000 [IU] via INTRAVENOUS

## 2021-09-15 MED ORDER — HEPARIN (PORCINE) IN NACL 1000-0.9 UT/500ML-% IV SOLN
INTRAVENOUS | Status: DC | PRN
  Administered 2021-09-15 (×2): 500 mL

## 2021-09-15 MED ORDER — LIDOCAINE HCL (PF) 1 % IJ SOLN
INTRAMUSCULAR | Status: AC
  Filled 2021-09-15: qty 30

## 2021-09-15 MED ORDER — DULOXETINE HCL 60 MG PO CPEP: 60.0000 mg | ORAL_CAPSULE | Freq: Every day | ORAL | 0 refills | Status: DC

## 2021-09-15 MED ORDER — SODIUM CHLORIDE 0.9 % IV SOLN: 250.0000 mL | INTRAVENOUS | Status: DC | PRN

## 2021-09-15 MED ORDER — HEPARIN SODIUM (PORCINE) 1000 UNIT/ML IJ SOLN
INTRAMUSCULAR | Status: AC
  Filled 2021-09-15: qty 10

## 2021-09-15 MED ORDER — SODIUM CHLORIDE 0.9 % WEIGHT BASED INFUSION: 1.0000 mL/kg/h | INTRAVENOUS | Status: DC

## 2021-09-15 SURGICAL SUPPLY — 10 items
CATH INFINITI 6F ANG MULTIPACK (CATHETERS) ×1 IMPLANT
CATH INFINITI 6F FL3.5 (CATHETERS) ×1 IMPLANT
DEVICE RAD COMP TR BAND LRG (VASCULAR PRODUCTS) ×1 IMPLANT
GLIDESHEATH SLEND SS 6F .021 (SHEATH) ×1 IMPLANT
GUIDEWIRE INQWIRE 1.5J.035X260 (WIRE) IMPLANT
INQWIRE 1.5J .035X260CM (WIRE) ×2
KIT HEART LEFT (KITS) ×3 IMPLANT
PACK CARDIAC CATHETERIZATION (CUSTOM PROCEDURE TRAY) ×3 IMPLANT
TRANSDUCER W/STOPCOCK (MISCELLANEOUS) ×3 IMPLANT
TUBING CIL FLEX 10 FLL-RA (TUBING) ×3 IMPLANT

## 2021-09-15 NOTE — Telephone Encounter (Signed)
Pt was to wean off of zoloft. He does not have 7 zoloft to take to wean down from 2 pills daily beofre starting Cymbalta. Asking if he should start cymbalta now or get 7 zoloft tabs. Send to [facility] if needed. Advise call back [phone] ?

## 2021-09-15 NOTE — Telephone Encounter (Signed)
Medication sent.

## 2021-09-15 NOTE — Telephone Encounter (Signed)
Patient advised.  ? ?Patient also was asking about his cholesterol medication changes. Cardiologist switched patient from Atorvastatin 40 mg  to Rosuvastatin 20 mg, on patient's paperwork it says to take Rosuvastatin 1 tablet daily but when he picked up his RX from the pharmacy directions on the bottle say to take 1/2 tablet daily. Advised patient I would check with provider that sent/started RX and Matt to see what the answer is for sure, advised it may have been a mistype on the pharmacist side but I would check. ?

## 2021-09-15 NOTE — Interval H&P Note (Signed)
History and Physical Interval Note: ? ?09/15/2021 ?8:42 AM ? ?[name]  has presented today for surgery, with the diagnosis of CAD, Chest Pain.  The various methods of treatment have been discussed with the patient and family. After consideration of risks, benefits and other options for treatment, the patient has consented to  Procedure(s): ?LEFT HEART CATH AND CORONARY ANGIOGRAPHY (N/A) as a surgical intervention.  The patient's history has been reviewed, patient examined, no change in status, stable for surgery.  I have reviewed the patient's chart and labs.  Questions were answered to the patient's satisfaction.   ? ?Cath Lab Visit (complete for each Cath Lab visit) ? ?Clinical Evaluation Leading to the Procedure:  ? ?ACS: No. ? ?Non-ACS:   ? ?Anginal Classification: CCS II ? ?Anti-ischemic medical therapy: Maximal Therapy (2 or more classes of medications) ? ?Non-Invasive Test Results: No non-invasive testing performed ? ?Prior CABG: No previous CABG ? ? ? ? ? ? ? ?[name] ? ? ?

## 2021-09-15 NOTE — Telephone Encounter (Signed)
I sent in 7 pills of the 100mg  zoloft to complete the wean. He needs to take those first then he can switch to the cymbalta 60mg  ?

## 2021-09-15 NOTE — Telephone Encounter (Signed)
Hi Anastasiya, ? ?Sorry, not sure how that happened because it looks like it says take 1 tablet in our system not 1/2. I would recommend he take the full 20mg  tablet. Thank you for clarifying! ? ?Best, ?Rashan Rounsaville ?

## 2021-09-16 NOTE — Telephone Encounter (Signed)
Error

## 2021-09-16 NOTE — Telephone Encounter (Signed)
Patient advised.

## 2021-09-29 ENCOUNTER — Encounter: Payer: Self-pay | Admitting: Nurse Practitioner
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
## 2021-10-14 ENCOUNTER — Encounter: Payer: Self-pay | Admitting: Cardiology

## 2021-10-14 ENCOUNTER — Ambulatory Visit (INDEPENDENT_AMBULATORY_CARE_PROVIDER_SITE_OTHER): Payer: Self-pay | Admitting: Cardiology

## 2021-10-14 VITALS — BP 142/79 | HR 84 | Ht 70.0 in | Wt 236.0 lb

## 2021-10-14 DIAGNOSIS — I25119 Atherosclerotic heart disease of native coronary artery with unspecified angina pectoris: Secondary | ICD-10-CM

## 2021-10-14 DIAGNOSIS — R079 Chest pain, unspecified: Secondary | ICD-10-CM

## 2021-10-14 DIAGNOSIS — F331 Major depressive disorder, recurrent, moderate: Secondary | ICD-10-CM

## 2021-10-14 NOTE — Assessment & Plan Note (Signed)
Continues to work on anxiety. Cymbalta. Per primary team.  ?

## 2021-10-14 NOTE — Patient Instructions (Signed)
Medication Instructions:  ? ? ?Your physician recommends that you continue on your current medications as directed. Please refer to the Current Medication list given to you today. ? ? ?*If you need a refill on your cardiac medications before your next appointment, please call your pharmacy* ? ? ?Lab Work:  [place] ? ? ?If you have labs (blood work) drawn today and your tests are completely normal, you will receive your results only by: ?MyChart Message (if you have MyChart) OR ?A paper copy in the mail ?If you have any lab test that is abnormal or we need to change your treatment, we will call you to review the results. ? ? ?Testing/Procedures:  NONE ORDERED  TODAY ? ? ? ?Follow-Up: ?At [facility], you and your health needs are our priority.  As part of our continuing mission to provide you with exceptional heart care, we have created designated Provider Care Teams.  These Care Teams include your primary Cardiologist (physician) and Advanced Practice Providers (APPs -  Physician Assistants and Nurse Practitioners) who all work together to provide you with the care you need, when you need it. ? ?We recommend signing up for the patient portal called "MyChart".  Sign up information is provided on this After Visit Summary.  MyChart is used to connect with patients for Virtual Visits (Telemedicine).  Patients are able to view lab/test results, encounter notes, upcoming appointments, etc.  Non-urgent messages can be sent to your provider as well.   ?To learn more about what you can do with MyChart, go to [email or web address].   ? ?Your next appointment:   ?6 month(s) ? ?The format for your next appointment:   ?In Person ? ?Provider:   ?Robbie Lis, PA-C, Melina Copa, PA-C, Ambrose Pancoast, NP, Ermalinda Barrios, PA-C, Christen Bame, NP, or Richardson Dopp, PA-C     Then, Candee Furbish, MD ? ? :1}  ? ? ?Other Instructions ? ?

## 2021-10-14 NOTE — Assessment & Plan Note (Signed)
Heart cath reassuring. CP may be in part from anxiety. OK with Imdur and NTG PRN. GDMT, list reviewed.  ?Cath reviewed ?

## 2021-10-28 ENCOUNTER — Other Ambulatory Visit: Payer: Self-pay | Admitting: Student

## 2021-11-18 ENCOUNTER — Encounter: Payer: Self-pay | Admitting: Nurse Practitioner

## 2021-11-18 ENCOUNTER — Ambulatory Visit (INDEPENDENT_AMBULATORY_CARE_PROVIDER_SITE_OTHER): Payer: Self-pay | Admitting: Nurse Practitioner

## 2021-11-18 VITALS — BP 140/62 | HR 81 | Temp 97.3°F | Resp 18 | Ht 70.0 in | Wt 238.0 lb

## 2021-11-18 DIAGNOSIS — I10 Essential (primary) hypertension: Secondary | ICD-10-CM

## 2021-11-18 DIAGNOSIS — F331 Major depressive disorder, recurrent, moderate: Secondary | ICD-10-CM

## 2021-11-18 DIAGNOSIS — F419 Anxiety disorder, unspecified: Secondary | ICD-10-CM

## 2021-11-18 DIAGNOSIS — R0602 Shortness of breath: Secondary | ICD-10-CM

## 2021-11-18 MED ORDER — BUSPIRONE HCL 5 MG PO TABS: 5.0000 mg | ORAL_TABLET | Freq: Three times a day (TID) | ORAL | 0 refills | Status: DC

## 2021-11-18 NOTE — Assessment & Plan Note (Signed)
Blood pressure borderline in office today.  He has not taken antihypertensive as of yet.  Take medication prescribed when he is at home ?

## 2021-11-18 NOTE — Assessment & Plan Note (Signed)
Patient currently maintained on Cymbalta 60 mg.  Depression has improved anxiety is unchanged.  Patient requested increase in Cymbalta.  Deferred at the current time as it will likely be beneficial to his anxiety.  We will start BuSpar 5 mg 3 times daily for anxiety.  Patient will reach out to me in the next 1 to 2 weeks via MyChart to see how he is doing and can titrate medication from the point ?

## 2021-11-18 NOTE — Patient Instructions (Signed)
Nice to see you today ?Let me know in about 1-2 weeks how you are doing on the buspar. ?Follow based on how you are doing via mychart ?

## 2021-11-18 NOTE — Assessment & Plan Note (Signed)
Currently using albuterol inhaler daily.  Patient is uninsured currently. ? ?I will work with in-house pharmacist and retail pharmacy location to see which inhaler would be the best option for patient financially or financial assistance available. ?

## 2021-11-18 NOTE — Progress Notes (Signed)
? ?Acute Office Visit ? ?Subjective:  ? ?  ?Patient ID: [name], male    DOB: 01-Oct-1965, 56 y.o.   MRN: [id number] ? ?Chief Complaint  ?Patient presents with  ? Anxiety  ?  Cymbalta has not helped emotionally has helped his foot pain. Panic attacks daily.   ? ? ? ?Patient is in today for Anxiety ? ?Daily anxiety attacks. States he was taking up to 12 nitroglycerin a day when pharmacy found out he was doing that said he needed to follow-up with PCP.  Patient has been evaluated cardiology did left heart cath per patient report states that his chest discomfort is related to his anxiety.  Patient was followed by psychiatry in the past.  He is currently maintained on duloxetine 60 mg daily.  He has had a decrease in his PHQ-9 score.  We will continue duloxetine 60 at the current juncture. ? ?HTN: Can check blood pressure at home but does not.  Blood pressure borderline today in office but he has not taken any of his antihypertensives as he takes it at nighttime. ? ? ?  11/18/2021  ?  4:52 PM 08/27/2021  ?  3:51 PM 05/04/2020  ?  5:02 PM  ?PHQ9 SCORE ONLY  ?PHQ-9 Total Score '17 22 11  '$ ?  ? ?  11/18/2021  ?  4:52 PM 08/27/2021  ?  3:51 PM 05/04/2020  ?  5:07 PM  ?GAD 7 : Generalized Anxiety Score  ?Nervous, Anxious, on Edge '3 3 3  '$ ?Control/stop worrying '3 3 1  '$ ?Worry too much - different things '3 3 1  '$ ?Trouble relaxing '2 2 3  '$ ?Restless 0  3  ?Easily annoyed or irritable 3  2  ?Afraid - awful might happen 0  0  ?Total GAD 7 Score 14  13  ?Anxiety Difficulty Extremely difficult    ? ?  ? ?Review of Systems  ?Constitutional:  Negative for chills and fever.  ?Respiratory:  Positive for shortness of breath.   ?Cardiovascular:  Positive for chest pain.  ?Psychiatric/Behavioral:  Negative for hallucinations and suicidal ideas. The patient is nervous/anxious. The patient does not have insomnia.   ? ? ?   ?Objective:  ?  ?BP 140/62   Pulse 81   Temp (!) 97.3 ?F (36.3 ?C)   Resp 18   Ht '5\' 10"'$  (1.778 m)   Wt 238 lb (108 kg)    SpO2 96%   BMI 34.15 kg/m?  ? ? ?Physical Exam ?Neck:  ?   Vascular: No carotid bruit.  ?Cardiovascular:  ?   Rate and Rhythm: Normal rate and regular rhythm.  ?Pulmonary:  ?   Effort: Pulmonary effort is normal.  ?   Breath sounds: Decreased breath sounds present.  ?Abdominal:  ?   General: Bowel sounds are normal.  ?Musculoskeletal:  ?   Right lower leg: No edema.  ?   Left lower leg: No edema.  ?Lymphadenopathy:  ?   Cervical: No cervical adenopathy.  ?Neurological:  ?   Mental Status: Mental status is at baseline.  ? ? ?No results found for any visits on 11/18/21. ? ? ?   ?Assessment & Plan:  ? ?Problem List Items Addressed This Visit   ? ?  ? Cardiovascular and Mediastinum  ? HTN (hypertension), benign (Chronic)  ?  Blood pressure borderline in office today.  He has not taken antihypertensive as of yet.  Take medication prescribed when he is at home ? ?  ?  ?  ?  Other  ? Major depressive disorder, recurrent episode, moderate ([facility]) (Chronic)  ?  Patient currently maintained on Cymbalta 60 mg.  Depression has improved anxiety is unchanged.  Patient requested increase in Cymbalta.  Deferred at the current time as it will likely be beneficial to his anxiety.  We will start BuSpar 5 mg 3 times daily for anxiety.  Patient will reach out to me in the next 1 to 2 weeks via MyChart to see how he is doing and can titrate medication from the point ? ?  ?  ? Relevant Medications  ? busPIRone (BUSPAR) 5 MG tablet  ? Shortness of breath  ?  Currently using albuterol inhaler daily.  Patient is uninsured currently. ? ?I will work with in-house pharmacist and retail pharmacy location to see which inhaler would be the best option for patient financially or financial assistance available. ? ?  ?  ? ?Other Visit Diagnoses   ? ? Anxiety    -  Primary  ? Relevant Medications  ? busPIRone (BUSPAR) 5 MG tablet  ? ?  ? ? ?Meds ordered this encounter  ?Medications  ? busPIRone (BUSPAR) 5 MG tablet  ?  Sig: Take 1 tablet (5 mg total) by  mouth 3 (three) times daily.  ?  Dispense:  90 tablet  ?  Refill:  0  ?  Order Specific Question:   Supervising Provider  ?  Answer:   Loura Pardon A [[id number]]  ? ? ?Return if symptoms worsen or fail to improve. ? ?Romilda Garret, NP ? ? ?

## 2021-11-24 ENCOUNTER — Other Ambulatory Visit: Payer: Self-pay | Admitting: Nurse Practitioner

## 2021-11-24 DIAGNOSIS — R0602 Shortness of breath: Secondary | ICD-10-CM

## 2021-11-24 DIAGNOSIS — Z91038 Other insect allergy status: Secondary | ICD-10-CM

## 2021-11-24 MED ORDER — BUDESONIDE-FORMOTEROL FUMARATE 160-4.5 MCG/ACT IN AERO: 2.0000 | INHALATION_SPRAY | Freq: Two times a day (BID) | RESPIRATORY_TRACT | 3 refills | Status: DC

## 2021-11-24 MED ORDER — EPINEPHRINE 0.3 MG/0.3ML IJ SOAJ: 0.3000 mg | INTRAMUSCULAR | 1 refills | Status: DC | PRN

## 2021-12-15 ENCOUNTER — Encounter: Payer: Self-pay | Admitting: Nurse Practitioner

## 2021-12-15 DIAGNOSIS — F419 Anxiety disorder, unspecified: Secondary | ICD-10-CM

## 2021-12-20 MED ORDER — BUSPIRONE HCL 10 MG PO TABS
10.0000 mg | ORAL_TABLET | Freq: Three times a day (TID) | ORAL | 1 refills | Status: DC
Start: 2021-12-20 — End: 2022-08-10

## 2021-12-21 ENCOUNTER — Ambulatory Visit: Payer: Self-pay | Admitting: Cardiology

## 2022-01-04 ENCOUNTER — Other Ambulatory Visit: Payer: Self-pay | Admitting: Family Medicine

## 2022-02-01 ENCOUNTER — Other Ambulatory Visit: Payer: Self-pay | Admitting: Nurse Practitioner

## 2022-02-01 DIAGNOSIS — F331 Major depressive disorder, recurrent, moderate: Secondary | ICD-10-CM

## 2022-02-03 ENCOUNTER — Other Ambulatory Visit: Payer: Self-pay | Admitting: Nurse Practitioner

## 2022-02-03 ENCOUNTER — Telehealth: Payer: Self-pay | Admitting: Nurse Practitioner

## 2022-02-03 DIAGNOSIS — Z91038 Other insect allergy status: Secondary | ICD-10-CM

## 2022-02-03 NOTE — Telephone Encounter (Signed)
  Encourage patient to contact the pharmacy for refills or they can request refills through [facility]  Did the patient contact the pharmacy:  Y   LAST APPOINTMENT DATE:  5.4.23  NEXT APPOINTMENT DATE: not scheduled  MEDICATION:EPINEPHrine (EPIPEN 2-PAK) 0.3 mg/0.3 mL IJ SOAJ injection  Is the patient out of medication? y  If not, how much is left?  Is this a 90 day supply:   [id number] [place] [place]:  [phone] Fax:  [phone]     Let patient know to contact pharmacy at the end of the day to make sure medication is ready.  Please notify patient to allow 48-72 hours to process

## 2022-02-03 NOTE — Telephone Encounter (Addendum)
See other note-refill note-RX refilled. Patsy Maroney advised, patient's mom. Also left message on home number before speaking with Patsy. No answer or voicemail on patient's cell number.

## 2022-02-03 NOTE — Telephone Encounter (Signed)
  Encourage patient to contact the pharmacy for refills or they can request refills through [facility]  Did the patient contact the pharmacy: No   LAST APPOINTMENT DATE: 11/18/21  NEXT APPOINTMENT DATE: N/A  MEDICATION: EPINEPHrine (EPIPEN 2-PAK) 0.3 mg/0.3 mL IJ SOAJ injection  Is the patient out of medication? Yes  If not, how much is left?  Is this a 90 day supply:   PHARMACY: [id number] [place], [place] - [address]  Comment: Patient was stung by a wasp this morning and used his last EPIPEN.   Let patient know to contact pharmacy at the end of the day to make sure medication is ready.  Please notify patient to allow 48-72 hours to process

## 2022-02-03 NOTE — Telephone Encounter (Signed)
Comment: Patient was stung by a wasp this morning and used his last EPIPEN.

## 2022-02-04 NOTE — Telephone Encounter (Signed)
[facility] Night - Client Nonclinical Telephone Record  [facility] Client [facility] Night - Client Client Site [place] - Night Provider Romilda Garret- NP Contact Type Call Who Is Calling Patient / Member / Family / Caregiver Caller Name [name] Caller Phone Number [phone] Call Type Message Only Information Provided Reason for Call Returning a Call from the Office Initial [name] states that he is returning call from office. Additional Comment Caller was provided with office hours. Disp. Time Disposition Final User 02/03/2022 5:04:53 PM General Information Provided Yes Sherre Lain Call Closed By: Sherre Lain Transaction Date/Time: 02/03/2022 5:02:29 PM (ET

## 2022-03-03 ENCOUNTER — Ambulatory Visit: Payer: Medicaid Other | Admitting: Nurse Practitioner

## 2022-03-30 ENCOUNTER — Encounter: Payer: Self-pay | Admitting: Nurse Practitioner

## 2022-03-30 ENCOUNTER — Ambulatory Visit (INDEPENDENT_AMBULATORY_CARE_PROVIDER_SITE_OTHER): Payer: Medicare Other | Admitting: Nurse Practitioner

## 2022-03-30 VITALS — BP 140/82 | HR 111 | Temp 97.9°F | Ht 70.0 in | Wt 231.0 lb

## 2022-03-30 DIAGNOSIS — F322 Major depressive disorder, single episode, severe without psychotic features: Secondary | ICD-10-CM

## 2022-03-30 MED ORDER — DULOXETINE HCL 30 MG PO CPEP
30.0000 mg | ORAL_CAPSULE | Freq: Every day | ORAL | 0 refills | Status: DC
Start: 2022-03-30 — End: 2022-04-22

## 2022-03-30 MED ORDER — FLUOXETINE HCL 10 MG PO TABS: ORAL_TABLET | ORAL | 0 refills | Status: DC

## 2022-03-30 NOTE — Assessment & Plan Note (Addendum)
Patient has passive thoughts of SI but no active thoughts or plans currently.  He did contract for safety with me in the front of his significant other in office.  Did give resources inclusive of "59" and behavioral health urgent care in [place].  We will titrate patient from 60 mg duloxetine to 30 mg of duloxetine for 1 week.  Then transition from 30 mg of duloxetine to 10 mg of fluoxetine.  After 2 weeks of fluoxetine we will increase him to 20 mg of fluoxetine.  This was all written down given to the patient.  Follow-up in 6 weeks, sooner if needed  Ambulatory referral to counselor placed today

## 2022-03-30 NOTE — Patient Instructions (Addendum)
Nice to see you today Start taking the cymbalta (duloxetine) '30mg'$  tomorrow for a week then switch to the Prozac (fluoxetine) there after  Behavioral health urgent care  Address: [address], [place], [place] [address] Hours:  Open 24 hours Phone: [phone]  Follow up with me in 6 weeks

## 2022-03-30 NOTE — Progress Notes (Signed)
Established Patient Office Visit  Subjective   Patient ID: [name], male    DOB: 03-28-1966  Age: 56 y.o. MRN: [id number]  Chief Complaint  Patient presents with   Depression      Depression: states that it has been flaring up for the past month. States that he has been on zoloft, duloxetine and Wellbutrin without good result.  States that tired all the time and do not want to be around people. SI, just thoughts. No plan and no previous action to end his life. No halluciations per patient report.  None observed in office  Used to be followed by psychiatry but switched from them to his primary care provider.  Would be interested in therapy.  Towards the end of the visit patient did want to speak about erectile dysfunction.  He states what about a "sex pill".  Discussed with patient that he is on a long-acting nitrate and cannot use the standard of care medications for erectile dysfunction we are than happy to send him to urology if he decides to do such.     03/30/2022    3:13 PM 11/18/2021    4:52 PM 08/27/2021    3:51 PM  PHQ9 SCORE ONLY  PHQ-9 Total Score '26 17 22       '$ Review of Systems  Constitutional:  Negative for chills and fever.  Respiratory:  Negative for shortness of breath.   Cardiovascular:  Negative for chest pain.  Neurological:  Negative for dizziness, tingling and headaches.  Psychiatric/Behavioral:  Positive for depression and suicidal ideas. Negative for hallucinations. The patient does not have insomnia.       Objective:     BP (!) 140/82   Pulse (!) 111   Temp 97.9 F (36.6 C) (Oral)   Ht '5\' 10"'$  (1.778 m)   Wt 231 lb (104.8 kg)   SpO2 97%   BMI 33.15 kg/m    Physical Exam Vitals and nursing note reviewed.  Constitutional:      Appearance: Normal appearance. He is obese.  Cardiovascular:     Rate and Rhythm: Normal rate and regular rhythm.     Heart sounds: Normal heart sounds.  Pulmonary:     Effort: Pulmonary effort is normal.      Breath sounds: Normal breath sounds.  Musculoskeletal:     Right lower leg: No edema.     Left lower leg: No edema.  Lymphadenopathy:     Cervical: No cervical adenopathy.  Neurological:     Mental Status: He is alert.  Psychiatric:        Attention and Perception: Attention normal.        Mood and Affect: Mood is depressed.        Speech: Speech normal.        Behavior: Behavior is withdrawn.        Thought Content: Thought content normal. Thought content does not include homicidal or suicidal plan.        Cognition and Memory: Cognition normal.        Judgment: Judgment normal.      No results found for any visits on 03/30/22.    The ASCVD Risk score (Arnett DK, et al., 2019) failed to calculate for the following reasons:   The patient has a prior MI or stroke diagnosis    Assessment & Plan:   Problem List Items Addressed This Visit       Other   Depression, major, single episode, severe ([place]) -  Primary    Patient has passive thoughts of SI but no active thoughts or plans currently.  He did contract for safety with me in the front of his significant other in office.  Did give resources inclusive of "67" and behavioral health urgent care in [place].  We will titrate patient from 60 mg duloxetine to 30 mg of duloxetine for 1 week.  Then transition from 30 mg of duloxetine to 10 mg of fluoxetine.  After 2 weeks of fluoxetine we will increase him to 20 mg of fluoxetine.  This was all written down given to the patient.  Follow-up in 6 weeks, sooner if needed  Ambulatory referral to counselor placed today      Relevant Medications   DULoxetine (CYMBALTA) 30 MG capsule   FLUoxetine (PROZAC) 10 MG tablet   Other Relevant Orders   Ambulatory referral to Psychology    Return in about 6 weeks (around 05/11/2022) for MDD recheck and chrnoic condition recheck .    Romilda Garret, NP

## 2022-04-01 ENCOUNTER — Other Ambulatory Visit: Payer: Self-pay

## 2022-04-01 ENCOUNTER — Emergency Department (HOSPITAL_COMMUNITY): Payer: Medicare Other

## 2022-04-01 ENCOUNTER — Emergency Department (HOSPITAL_COMMUNITY)
Admission: EM | Admit: 2022-04-01 | Discharge: 2022-04-01 | Disposition: A | Discharge status: Home / Self Care | Payer: Medicare Other | Attending: Emergency Medicine | Admitting: Emergency Medicine

## 2022-04-01 DIAGNOSIS — G8929 Other chronic pain: Secondary | ICD-10-CM | POA: Insufficient documentation

## 2022-04-01 DIAGNOSIS — N182 Chronic kidney disease, stage 2 (mild): Secondary | ICD-10-CM | POA: Insufficient documentation

## 2022-04-01 DIAGNOSIS — S8992XA Unspecified injury of left lower leg, initial encounter: Secondary | ICD-10-CM | POA: Diagnosis present

## 2022-04-01 DIAGNOSIS — S81812A Laceration without foreign body, left lower leg, initial encounter: Secondary | ICD-10-CM | POA: Diagnosis not present

## 2022-04-01 DIAGNOSIS — I2510 Atherosclerotic heart disease of native coronary artery without angina pectoris: Secondary | ICD-10-CM | POA: Insufficient documentation

## 2022-04-01 DIAGNOSIS — Z91040 Latex allergy status: Secondary | ICD-10-CM | POA: Diagnosis not present

## 2022-04-01 DIAGNOSIS — Z7902 Long term (current) use of antithrombotics/antiplatelets: Secondary | ICD-10-CM | POA: Diagnosis not present

## 2022-04-01 DIAGNOSIS — S8012XA Contusion of left lower leg, initial encounter: Secondary | ICD-10-CM | POA: Insufficient documentation

## 2022-04-01 DIAGNOSIS — Z23 Encounter for immunization: Secondary | ICD-10-CM | POA: Diagnosis not present

## 2022-04-01 DIAGNOSIS — W293XXA Contact with powered garden and outdoor hand tools and machinery, initial encounter: Secondary | ICD-10-CM | POA: Diagnosis not present

## 2022-04-01 DIAGNOSIS — I129 Hypertensive chronic kidney disease with stage 1 through stage 4 chronic kidney disease, or unspecified chronic kidney disease: Secondary | ICD-10-CM | POA: Diagnosis not present

## 2022-04-01 LAB — COMPREHENSIVE METABOLIC PANEL
ALT: 21 U/L (ref 0–44)
AST: 19 U/L (ref 15–41)
Albumin: 3.2 g/dL — ABNORMAL LOW (ref 3.5–5.0)
Alkaline Phosphatase: 78 U/L (ref 38–126)
Anion gap: 10 (ref 5–15)
BUN: 15 mg/dL (ref 6–20)
CO2: 23 mmol/L (ref 22–32)
Calcium: 8.2 mg/dL — ABNORMAL LOW (ref 8.9–10.3)
Chloride: 107 mmol/L (ref 98–111)
Creatinine, Ser: 1.33 mg/dL — ABNORMAL HIGH (ref 0.61–1.24)
GFR, Estimated: >60 mL/min (ref >60)
Glucose, Bld: 139 mg/dL — ABNORMAL HIGH (ref 70–99)
Potassium: 3.8 mmol/L (ref 3.5–5.1)
Sodium: 140 mmol/L (ref 135–145)
Total Bilirubin: 0.3 mg/dL (ref 0.3–1.2)
Total Protein: 5.3 g/dL — ABNORMAL LOW (ref 6.5–8.1)

## 2022-04-01 LAB — CBC
HCT: 41.4 % (ref 39.0–52.0)
Hemoglobin: 13.8 g/dL (ref 13.0–17.0)
MCH: 30.7 pg (ref 26.0–34.0)
MCHC: 33.3 g/dL (ref 30.0–36.0)
MCV: 92 fL (ref 80.0–100.0)
Platelets: 180 10*3/uL (ref 150–400)
RBC: 4.5 MIL/uL (ref 4.22–5.81)
RDW: 13.1 % (ref 11.5–15.5)
WBC: 7.4 10*3/uL (ref 4.0–10.5)
nRBC: 0 % (ref 0.0–0.2)

## 2022-04-01 LAB — I-STAT CHEM 8, ED
BUN: 15 mg/dL (ref 6–20)
Calcium, Ion: 1.11 mmol/L — ABNORMAL LOW (ref 1.15–1.40)
Chloride: 105 mmol/L (ref 98–111)
Creatinine, Ser: 1.2 mg/dL (ref 0.61–1.24)
Glucose, Bld: 128 mg/dL — ABNORMAL HIGH (ref 70–99)
HCT: 41 % (ref 39.0–52.0)
Hemoglobin: 13.9 g/dL (ref 13.0–17.0)
Potassium: 3.9 mmol/L (ref 3.5–5.1)
Sodium: 142 mmol/L (ref 135–145)
TCO2: 24 mmol/L (ref 22–32)

## 2022-04-01 LAB — TRAUMA TEG PANEL
CFF Max Amplitude: 17.4 mm (ref 15–32)
Citrated Kaolin (R): 5.2 min (ref 4.6–9.1)
Citrated Rapid TEG (MA): 54.7 mm (ref 52–70)
Lysis at 30 Minutes: 3 % — ABNORMAL HIGH (ref 0.0–2.6)

## 2022-04-01 LAB — ETHANOL: Alcohol, Ethyl (B): <10 mg/dL (ref <10)

## 2022-04-01 LAB — ABO/RH: ABO/RH(D): O POS

## 2022-04-01 LAB — PROTIME-INR
INR: 1.1 (ref 0.8–1.2)
Prothrombin Time: 14 seconds (ref 11.4–15.2)

## 2022-04-01 LAB — LACTIC ACID, PLASMA: Lactic Acid, Venous: 4.8 mmol/L (ref 0.5–1.9)

## 2022-04-01 MED ORDER — SULFAMETHOXAZOLE-TRIMETHOPRIM 800-160 MG PO TABS: 1.0000 | ORAL_TABLET | Freq: Two times a day (BID) | ORAL | 0 refills | Status: AC

## 2022-04-01 MED ORDER — LACTATED RINGERS IV BOLUS
1000.0000 mL | Freq: Once | INTRAVENOUS | Status: AC
  Administered 2022-04-01: 1000 mL via INTRAVENOUS

## 2022-04-01 MED ORDER — CEFAZOLIN SODIUM-DEXTROSE 2-4 GM/100ML-% IV SOLN
2.0000 g | Freq: Once | INTRAVENOUS | Status: AC
  Administered 2022-04-01: 2 g via INTRAVENOUS

## 2022-04-01 MED ORDER — TETANUS-DIPHTH-ACELL PERTUSSIS 5-2.5-18.5 LF-MCG/0.5 IM SUSY
0.5000 mL | PREFILLED_SYRINGE | Freq: Once | INTRAMUSCULAR | Status: AC
  Administered 2022-04-01: 0.5 mL via INTRAMUSCULAR

## 2022-04-01 NOTE — ED Provider Notes (Signed)
[facility] EMERGENCY DEPARTMENT Provider Note   CSN: [id number] Arrival date & time: 04/01/22  1835     History  Chief Complaint  Patient presents with   level 1 trauma   Leg Injury    [name] is a 56 y.o. male. Presenting via EMS after sustaining injury from a chainsaw to the left lower extremity.  He had significant bleeding on the scene.  He is on anticoagulation, Plavix. HPI     Home Medications Prior to Admission medications   Medication Sig Start Date End Date Taking? Authorizing Provider  sulfamethoxazole-trimethoprim (BACTRIM DS) 800-160 MG tablet Take 1 tablet by mouth 2 (two) times daily for 7 days. 04/01/22 04/08/22 Yes Kela Millin, MD      Allergies    Bee venom, Iodinated contrast media, Latex, and Penicillins    Review of Systems   Review of Systems  Unable to perform ROS: Acuity of condition    Physical Exam Updated Vital Signs BP 132/70   Pulse 83   Resp 18   Ht 5\' 10"  (1.778 m)   Wt 104.8 kg   SpO2 100%   BMI 33.15 kg/m  Physical Exam Vitals and nursing note reviewed.  Constitutional:      General: He is in acute distress.     Appearance: He is well-developed. He is ill-appearing and toxic-appearing.  HENT:     Head: Normocephalic and atraumatic.  Eyes:     Conjunctiva/sclera: Conjunctivae normal.  Cardiovascular:     Rate and Rhythm: Normal rate and regular rhythm.     Heart sounds: No murmur heard. Pulmonary:     Effort: Pulmonary effort is normal. No respiratory distress.     Breath sounds: Normal breath sounds.  Abdominal:     Palpations: Abdomen is soft.     Tenderness: There is no abdominal tenderness.  Musculoskeletal:        General: Tenderness and signs of injury present. No swelling.     Cervical back: Neck supple.     Comments: Approximately 6 cm laceration to the left lower leg with large amount of active oozing  Skin:    General: Skin is warm and dry.     Capillary Refill: Capillary refill takes  less than 2 seconds.     Coloration: Skin is pale.  Neurological:     Mental Status: He is alert.  Psychiatric:        Mood and Affect: Mood normal.     ED Results / Procedures / Treatments   Labs (all labs ordered are listed, but only abnormal results are displayed) Labs Reviewed  COMPREHENSIVE METABOLIC PANEL - Abnormal; Notable for the following components:      Result Value   Glucose, Bld 139 (*)    Creatinine, Ser 1.33 (*)    Calcium 8.2 (*)    Total Protein 5.3 (*)    Albumin 3.2 (*)    All other components within normal limits  LACTIC ACID, PLASMA - Abnormal; Notable for the following components:   Lactic Acid, Venous 4.8 (*)    All other components within normal limits  TRAUMA TEG PANEL - Abnormal; Notable for the following components:   Lysis at 30 Minutes 3 (*)    All other components within normal limits  I-STAT CHEM 8, ED - Abnormal; Notable for the following components:   Glucose, Bld 128 (*)    Calcium, Ion 1.11 (*)    All other components within normal limits  CBC  ETHANOL  PROTIME-INR  TYPE AND SCREEN  PREPARE FRESH FROZEN PLASMA  ABO/RH    EKG None  Radiology CT TIBIA FIBULA LEFT WO CONTRAST  Result Date: 04/01/2022 CLINICAL DATA:  Chainsaw injury to left medial calf, on Plavix, no contrast administered due to IV contrast allergy EXAM: CT OF THE LOWER LEFT EXTREMITY WITHOUT CONTRAST TECHNIQUE: Multidetector CT imaging of the lower left extremity was performed according to the standard protocol. RADIATION DOSE REDUCTION: This exam was performed according to the departmental dose-optimization program which includes automated exposure control, adjustment of the mA and/or kV according to patient size and/or use of iterative reconstruction technique. COMPARISON:  None Available. FINDINGS: Imaging performed from the knee to the ankle. No fracture is seen. IM nail extending from the calcaneus through the distal tibia. Associated posttraumatic deformity with  degenerative change. No evidence of hardware loosening or fracture. No drainable fluid collection/abscess. No intramuscular or subcutaneous hematoma. Subtle subcutaneous laceration with very mild soft tissue gas along the medial aspect of the mid calf (series 9/image 130). IMPRESSION: Subtle subcutaneous laceration along the medial aspect of the mid calf. No associated subcutaneous or intramuscular hematoma. No fracture is seen.  Postsurgical changes, as above. These results were called by telephone at the time of interpretation on 04/01/2022 at 7:30 pm to provider [facility] , who verbally acknowledged these results. Electronically Signed   By: Charline Bills M.D.   On: 04/01/2022 19:34    Procedures .Marland KitchenLaceration Repair  Date/Time: 04/01/2022 7:00 PM  Performed by: Kela Millin, MD Authorized by: Charlynne Pander, MD   Consent:    Consent obtained:  Emergent situation Anesthesia:    Anesthesia method:  Local infiltration   Local anesthetic:  Lidocaine 1% WITH epi Laceration details:    Location:  Leg   Leg location:  L lower leg   Length (cm):  6 Exploration:    Hemostasis obtained with: Figure-of-eight suture.   Wound exploration: entire depth of wound visualized   Treatment:    Area cleansed with:  Saline   Amount of cleaning:  Extensive   Irrigation solution:  Sterile saline   Irrigation method:  Syringe Skin repair:    Repair method:  Sutures   Suture size:  3-0   Suture material:  Nylon   Suture technique:  Figure eight and simple interrupted   Number of sutures:  4 (1 figure-of-eight suture, 3 simple sutures) Approximation:    Approximation:  Loose Repair type:    Repair type:  Intermediate Post-procedure details:    Dressing:  Non-adherent dressing, adhesive bandage and tube gauze   Procedure completion:  Tolerated     Medications Ordered in ED Medications  ceFAZolin (ANCEF) IVPB 2g/100 mL premix (0 g Intravenous Stopped 04/01/22 1930)  Tdap (BOOSTRIX)  injection 0.5 mL (0.5 mLs Intramuscular Given 04/01/22 1849)  lactated ringers bolus 1,000 mL (0 mLs Intravenous Stopped 04/01/22 2122)    ED Course/ Medical Decision Making/ A&P                           Medical Decision Making Amount and/or Complexity of Data Reviewed Labs: ordered.  Risk Prescription drug management.   56 year old male with past medical history of alcohol and cocaine abuse in remission, CKD stage II, nephrolithiasis, chronic back pain, depression, hypertension, CAD s/p stents currently on Plavix presenting as a level 1 trauma activation after sustaining injury to the left lower extremity from chainsaw.  On arrival patient was  was given 2 units of emergency release blood due to hypotension. Laceration was immediately repaired to control bleeding.  Sutures were placed.  Please see procedure note below. Blood pressure quickly improved. She was also given 2 g Ancef and tetanus booster.  Differential diagnosis includes fracture, contusion, laceration, hematoma, muscle injury, tendon injury.   Labs reviewed.  Creatinine 1.33, however patient does have history of CKD.  Unknown baseline.  Lactic acid initially 4.8, likely due to trauma.  Patient given 1 L IV fluids.  No leukocytosis or significant anemia.  CT left tib-fib was obtained that did not show any fracture or intramuscular hematoma.  It did show a subcutaneous laceration.  On reevaluation, patient able to tolerate p.o. Blood pressure has remained stable while observed in ED for over 3 hours.  I discussed patient's lab abnormalities, including his elevated lactic.  Patient did not want to stay for repeat labs, which I do feel is appropriate he got a liter of fluid and is tolerating p.o.  Recommended continued hydration and follow-up with his primary doctor. Patient was discharged with antibiotic Bactrim and instruction to follow-up with a physician in 2 to 3 days for a wound check.  Discussed signs and symptoms of  infection and patient and spouse voiced understanding and all questions answered.  Strict return precautions given.        Final Clinical Impression(s) / ED Diagnoses Final diagnoses:  Contusion of left lower extremity, initial encounter    Rx / DC Orders ED Discharge Orders          Ordered    sulfamethoxazole-trimethoprim (BACTRIM DS) 800-160 MG tablet  2 times daily        04/01/22 2121              Kela Millin, MD 04/01/22 2347    Charlynne Pander, MD 04/02/22 [phone]

## 2022-04-01 NOTE — ED Notes (Signed)
Lee MD placed sutures on leg wound, combat gauze applied and leg wrapped, bleeding controlled at this time.

## 2022-04-01 NOTE — TOC CAGE-AID Note (Signed)
Transition of Care [facility]) - CAGE-AID Screening   Patient Details  Name: [name] MRN: [id number] Date of Birth: 11-12-1965  Transition of Care (TOC) CM/SW Contact:    Katha Hamming, RN Phone [phone] 04/01/2022, 9:10 PM    CAGE-AID Screening:    Have You Ever Felt You Ought to Cut Down on Your Drinking or Drug Use?: No Have People Annoyed You By Critizing Your Drinking Or Drug Use?: No Have You Felt Bad Or Guilty About Your Drinking Or Drug Use?: No Have You Ever Had a Drink or Used Drugs First Thing In The Morning to Steady Your Nerves or to Get Rid of a Hangover?: No CAGE-AID Score: 0  Substance Abuse Education Offered: No

## 2022-04-01 NOTE — ED Triage Notes (Signed)
Pt here via [facility] as level 1 trauma for lower L leg injury. Pt cut his leg while using a skill saw, pt tried to control bleeding for approx 45 min before calling EMS. Pt is on plavix due to MI last year. Upon EMS arrival, GCS 13, SBP 52, HR 120s, 100% RA. EMS gave total NS, last SBP 92. 18g LAC

## 2022-04-01 NOTE — Discharge Instructions (Signed)
Please take antibiotics directed for the next week.  Follow-up with a physician in 2 days for a wound check.  This can either be your primary doctor, this ED, or an urgent care.  Please keep wound clean and dry.  Return to the ED for any signs of infection, as discussed.

## 2022-04-01 NOTE — Progress Notes (Addendum)
   04/01/22 1811  Clinical Encounter Type  Visited With Patient not available;Health care provider  Visit Type ED;Trauma;Initial  Referral From Nurse Grier Mitts. Orson Aloe)  Consult/Referral To Chaplain Gelene Mink [place])  Recommendations Level 1 Trauma   Chaplain paged to M.[facility] Trauma Room B for Level 1 Trauma. Medical team providing care to Mr. [name]. No family present at this time. Chaplain did not have patient contact at this time. No family present at this time. Spiritual Care Services not necessary at this time. [address] [place], Aletha Halim., [phone]

## 2022-04-01 NOTE — H&P (Signed)
Surgical Evaluation  Chief Complaint: saw injury to left lower leg  HPI: 56yo male who presents as a level 1 trauma. He sustained a circular saw injury to his left lower leg about an hour and a half before presentation. He waited about 30 minutes after the injury to call EMS. Initial BP per EMS was 52/palp, with improvement to 92 systolic en route after crystalloid. Initially confused and altered but mental status improved with blood pressure. He reports pain in his leg but denies any other injury.   Allergies  Allergen Reactions   Bee Venom    Iodinated Contrast Media     CARDIAC ARREST   Latex    Penicillins   On review of shadow chart patient has anaphylactic allergies to contrast, penicillins, fish, and latex and a swelling allergy to bee venom.   Extensive medical history and surgical history per shadow chart: history of alcohol and cocain abuse in remission, aortic root dilation, CKD 2 with hx acute renal failure after last surgery, hx kidney stones, chronic low back pain, depression, gerd, hemorrhoids, hypertension, MRSA, OSA on CPAP, reflex sympathetic dystrophy L foot due to calcaneus fx, CAD s/p stents a year ago, multiple orthopedic surgeries, inguinal hernia repair...  Family history reviewed.   Social hx- smokes cigarettes, prior EtOh and drug abuse  Meds- on plavix and aspirin, multiple antihypertensives/cardiac meds per shadow chart, med rec pendinh  Review of Systems: a complete, 10pt review of systems was completed with pertinent positives and negatives as documented in the HPI  Physical Exam: Vitals:   04/01/22 1854 04/01/22 1857  BP: (!) 134/92 132/84  Pulse: 89   Resp: 12 19  SpO2: 100%    Gen: A&Ox3 Eyes: lids and conjunctivae normal, no icterus. Pupils equally round and reactive to light.  Neck: supple without mass or thyromegaly; trachea midline, no crepitus or hematoma Chest: respiratory effort is normal. No crepitus or tenderness on palpation of  the chest. Breath sounds equal.  Cardiovascular: RRR with palpable distal pulses including left DP and PT, no pedal edema Gastrointestinal: soft, nondistended, nontender. No mass, hepatomegaly or splenomegaly. Healed RUQ transverse incision Lymphatic: no lymphadenopathy in the neck or groin Muscoloskeletal: no clubbing or cyanosis of the fingers.  Strength is symmetrical throughout.  Range of motion of bilateral upper and lower extremities normal. Chronic deformity to left ankle. 10cm clean oblique laceration to medial left lower leg with venous oozing controlled with direct pressure/combat gauze followed by suture repair. No palpable underlying hematoma; compartments soft. Able to move his toes/foot and sensation intact Neuro: cranial nerves grossly intact.  Sensation intact to light touch diffusely. Psych: appropriate mood and affect, normal insight/judgment intact  Skin: warm and dry      Latest Ref Rng & Units 04/01/2022    6:52 PM 04/01/2022    6:44 PM  CBC  WBC 4.0 - 10.5 K/uL  7.4   Hemoglobin 13.0 - 17.0 g/dL 35.0  09.3   Hematocrit 39.0 - 52.0 % 41.0  41.4   Platelets 150 - 400 K/uL  180        Latest Ref Rng & Units 04/01/2022    6:52 PM 04/01/2022    6:44 PM  CMP  Glucose 70 - 99 mg/dL 818  299   BUN 6 - 20 mg/dL 15  15   Creatinine 3.71 - 1.24 mg/dL 6.96  7.89   Sodium 381 - 145 mmol/L 142  140   Potassium 3.5 - 5.1 mmol/L 3.9  3.8  Chloride 98 - 111 mmol/L 105  107   CO2 22 - 32 mmol/L  23   Calcium 8.9 - 10.3 mg/dL  8.2   Total Protein 6.5 - 8.1 g/dL  5.3   Total Bilirubin 0.3 - 1.2 mg/dL  0.3   Alkaline Phos 38 - 126 U/L  78   AST 15 - 41 U/L  19   ALT 0 - 44 U/L  21     Lab Results  Component Value Date   INR 1.1 04/01/2022    Imaging: No results found.   A/P: Circular saw injury to LLE. Venous bleeding controlled with suture and hemodynamics resolved with resus (3 RBC, 2 FFP).  No apparent underlying hematoma or violation of muscle planes on  noncontrasted CT (anaphylactic contrast allergy), no concern for vascular injury, no orthopedic injury.  Trauma service will sign off at this time.     Phylliss Blakes, MD [facility], [place]  See AMION to contact appropriate on-call provider

## 2022-04-01 NOTE — ED Notes (Signed)
Patient transported to CT w/ Emilie TRN, Orpha Bur TRN and [employer].

## 2022-04-01 NOTE — ED Notes (Signed)
NT unable to get manual BP x2. NT unable to get palpated BP x2. Pt clammy, pale, GCS 13. Verbal order from Fredricka Bonine MD and Silverio Lay MD to start emergent PRBCs.

## 2022-04-01 NOTE — ED Notes (Signed)
Trauma Response Nurse Documentation   [name] is a 56 y.o. male arriving to [facility] ED via EMS  On clopidogrel 75 mg daily. Trauma was activated as a Level 1 by ED Charge RN based on the following trauma criteria Anytime Systolic Blood Pressure < 90. Trauma team at the bedside on patient arrival.   Patient cleared for CT by Dr. Fredricka Bonine. Pt transported to CT with trauma response nurse present to monitor. RN remained with the patient throughout their absence from the department for clinical observation.  Pt severely hypotensive (52palp en route), requiring pressure to leg to control bleeding.  Pt also actively requiring blood products.  GCS 13 on arrival.  History   No past medical history on file.       Initial Focused Assessment (If applicable, or please see trauma documentation): - Pt very pale - Unable to obtain manual BP upon arrival - LLE actively bleeding - 18G PIV to L AC - GCS 13  CT's Completed:   CT to LLE (tib/fib)  Interventions:  - 18G PIV to R AC placed - trauma labs drawn (including teg panel) - [place] ran (1L warmed NS given, 3U PRBCS, 3U FFP) - 2g Ancef given - tdap given - LLE sutured by trauma surgeon and EDPs - Combat gauze applied to LLE and wrapped in kerlix  - CT of LLE  Plan for disposition:  Discharge home - probable. Awaiting radiology read of images.  Consults completed:  none at 1920.  Event Summary: Pt here via [facility] as level 1 trauma for lower L leg injury. Pt cut his leg while using a skill saw, pt tried to control bleeding for approx 45 min before calling EMS. Pt is on plavix due to MI last year. Upon EMS arrival, GCS 13, SBP 52, HR 120s, 100% RA. EMS gave total NS, last SBP 92. 18g LAC.  Pt states that he has a severe CT contrast allergy where he gets anaphylactic.  Due to this, non-con CT performed.    Pt's color is now pink and his BP is 158/80.  Pt's GCS is 15 and he is resting comfortably.   MTP Summary (If  applicable): n/a - emergency blood given (see details above and in chart)  Bedside handoff with ED RN Mykenzie.    Janora Norlander  Trauma Response RN  Please call TRN at [phone] for further assistance.

## 2022-04-01 NOTE — Progress Notes (Signed)
Orthopedic Tech Progress Note Patient Details:  [name] 02/23/1966 [id number]  Patient ID: [name], male   DOB: February 20, 1966, 56 y.o.   MRN: [id number] Level I; not currently needed. [name] 04/01/2022, 6:57 PM

## 2022-04-01 NOTE — ED Notes (Signed)
RN re-wrapped pt's wound and placed ace bandage to hold it in place. Pt given paper scrubs to change into. RN reviewed discharge instructions, pt had no further questions. Pt requested to ambulate to lobby, refused wheelchair. Pt denies pain, has steady gait.

## 2022-04-02 LAB — TYPE AND SCREEN
ABO/RH(D): O POS
Antibody Screen: NEGATIVE
Unit division: 0
Unit division: 0
Unit division: 0
Unit division: 0
Unit division: 0
Unit division: 0

## 2022-04-02 LAB — BPAM RBC
Blood Product Expiration Date: 202310042359
Blood Product Expiration Date: 202310132359
Blood Product Expiration Date: 202310132359
Blood Product Expiration Date: 202310162359
Blood Product Expiration Date: 202310172359
Blood Product Expiration Date: 202310172359
ISSUE DATE / TIME: 202309151817
ISSUE DATE / TIME: 202309151817
ISSUE DATE / TIME: 202309151835
ISSUE DATE / TIME: 202309151835
ISSUE DATE / TIME: 202309151835
ISSUE DATE / TIME: 202309151835
Unit Type and Rh: 5100
Unit Type and Rh: 5100
Unit Type and Rh: 5100
Unit Type and Rh: 5100
Unit Type and Rh: 5100
Unit Type and Rh: 5100

## 2022-04-02 LAB — PREPARE FRESH FROZEN PLASMA
Unit division: 0
Unit division: 0

## 2022-04-02 LAB — BPAM FFP
Blood Product Expiration Date: 202309182359
Blood Product Expiration Date: 202309252359
ISSUE DATE / TIME: 202309151846
ISSUE DATE / TIME: 202309151846
Unit Type and Rh: 6200
Unit Type and Rh: 6200

## 2022-04-02 LAB — BLOOD PRODUCT ORDER (VERBAL) VERIFICATION

## 2022-04-05 ENCOUNTER — Ambulatory Visit: Payer: Medicare Other | Admitting: Internal Medicine

## 2022-04-06 ENCOUNTER — Encounter: Payer: Self-pay | Admitting: Internal Medicine

## 2022-04-06 ENCOUNTER — Ambulatory Visit (INDEPENDENT_AMBULATORY_CARE_PROVIDER_SITE_OTHER): Payer: Medicare Other | Admitting: Internal Medicine

## 2022-04-06 VITALS — BP 136/80 | HR 91 | Temp 98.4°F | Ht 70.0 in | Wt 237.7 lb

## 2022-04-06 DIAGNOSIS — I25119 Atherosclerotic heart disease of native coronary artery with unspecified angina pectoris: Secondary | ICD-10-CM

## 2022-04-06 DIAGNOSIS — Z5189 Encounter for other specified aftercare: Secondary | ICD-10-CM
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
## 2022-04-09 ENCOUNTER — Other Ambulatory Visit: Payer: Self-pay | Admitting: Student

## 2022-04-14 ENCOUNTER — Encounter: Payer: Self-pay | Admitting: Family Medicine

## 2022-04-14 ENCOUNTER — Ambulatory Visit (INDEPENDENT_AMBULATORY_CARE_PROVIDER_SITE_OTHER): Payer: Medicare Other | Admitting: Family Medicine

## 2022-04-14 VITALS — BP 140/86 | HR 97 | Temp 98.1°F | Ht 70.0 in | Wt 242.2 lb

## 2022-04-14 DIAGNOSIS — S81812D Laceration without foreign body, left lower leg, subsequent encounter: Secondary | ICD-10-CM

## 2022-04-14 DIAGNOSIS — S81812A Laceration without foreign body, left lower leg, initial encounter: Secondary | ICD-10-CM
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
## 2022-04-20 ENCOUNTER — Other Ambulatory Visit (HOSPITAL_COMMUNITY): Payer: Self-pay

## 2022-04-21 ENCOUNTER — Other Ambulatory Visit: Payer: Self-pay | Admitting: Nurse Practitioner

## 2022-04-21 DIAGNOSIS — F322 Major depressive disorder, single episode, severe without psychotic features: Secondary | ICD-10-CM

## 2022-04-22 ENCOUNTER — Other Ambulatory Visit: Payer: Self-pay | Admitting: Nurse Practitioner

## 2022-04-22 DIAGNOSIS — F322 Major depressive disorder, single episode, severe without psychotic features: Secondary | ICD-10-CM

## 2022-04-22 MED ORDER — FLUOXETINE HCL 20 MG PO CAPS: 20.0000 mg | ORAL_CAPSULE | Freq: Every day | ORAL | 0 refills | Status: DC

## 2022-05-08 ENCOUNTER — Other Ambulatory Visit: Payer: Self-pay | Admitting: Nurse Practitioner

## 2022-05-08 DIAGNOSIS — F322 Major depressive disorder, single episode, severe without psychotic features: Secondary | ICD-10-CM

## 2022-05-11 ENCOUNTER — Ambulatory Visit: Payer: Medicare Other | Admitting: Nurse Practitioner

## 2022-05-12 ENCOUNTER — Telehealth: Payer: Self-pay | Admitting: Nurse Practitioner

## 2022-05-12 ENCOUNTER — Ambulatory Visit (INDEPENDENT_AMBULATORY_CARE_PROVIDER_SITE_OTHER): Payer: Medicare Other | Admitting: Nurse Practitioner

## 2022-05-12 ENCOUNTER — Encounter: Payer: Self-pay | Admitting: Nurse Practitioner

## 2022-05-12 VITALS — BP 138/78 | HR 92 | Temp 96.7°F | Ht 70.0 in | Wt 246.0 lb

## 2022-05-12 DIAGNOSIS — Z23 Encounter for immunization: Secondary | ICD-10-CM | POA: Diagnosis not present

## 2022-05-12 DIAGNOSIS — F322 Major depressive disorder, single episode, severe without psychotic features: Secondary | ICD-10-CM

## 2022-05-12 DIAGNOSIS — G4733 Obstructive sleep apnea (adult) (pediatric): Secondary | ICD-10-CM | POA: Diagnosis not present

## 2022-05-12 DIAGNOSIS — R0602 Shortness of breath: Secondary | ICD-10-CM | POA: Diagnosis not present

## 2022-05-12 DIAGNOSIS — I10 Essential (primary) hypertension: Secondary | ICD-10-CM | POA: Diagnosis not present

## 2022-05-12 MED ORDER — BUDESONIDE-FORMOTEROL FUMARATE 160-4.5 MCG/ACT IN AERO: 2.0000 | INHALATION_SPRAY | Freq: Two times a day (BID) | RESPIRATORY_TRACT | 3 refills | Status: DC

## 2022-05-12 MED ORDER — FLUOXETINE HCL 40 MG PO CAPS: 40.0000 mg | ORAL_CAPSULE | Freq: Every day | ORAL | 0 refills | Status: DC

## 2022-05-12 MED ORDER — FLUTICASONE-SALMETEROL 250-50 MCG/ACT IN AEPB: 1.0000 | INHALATION_SPRAY | Freq: Two times a day (BID) | RESPIRATORY_TRACT | 0 refills | Status: DC

## 2022-05-12 NOTE — Telephone Encounter (Signed)
Patient called and stated the budesonide-formoterol (SYMBICORT) 160-4.5 MCG/ACT inhaler is too high, it is $110. Call back number [phone].

## 2022-05-12 NOTE — Telephone Encounter (Signed)
Can we call and cancel the symbicort. I will send in advair and see if that is cheaper

## 2022-05-12 NOTE — Telephone Encounter (Signed)
What other inhalers can patient try to check the cost on with insurance?

## 2022-05-12 NOTE — Assessment & Plan Note (Signed)
Multifactorial given patient's body habitus, lung status, heart comorbidities.  We will refill Symbicort and the patient is insured.  If too expensive or unobtainable patient will reach out.  Can using albuterol inhaler as needed

## 2022-05-12 NOTE — Patient Instructions (Signed)
Nice to see you today We updated your flu vaccine today If the Symbicort inhaler is too expensive let me know. Rinse you mouth out after each use You can take 2 tablets of the Prozac '20mg'$  tabs until you run out. The new prescription will be a '40mg'$  tablet  Want to see you in 6 weeks, sooner if you need me

## 2022-05-12 NOTE — Telephone Encounter (Signed)
Cancelled Symbicort at [facility]. Called patient but no answer and voicemail box is not set up

## 2022-05-12 NOTE — Assessment & Plan Note (Signed)
PHQ-9 and GAD-7 administered office.  Patient had a moderate reduction in scores on both surveys.  We will titrate patient from Prozac 20 mg to Prozac 40 mg.  Patient can take 2 of the 20 mg tablets until complete new prescription for Prozac 40 mg and today.  We will rerefer patient to therapy as he states he never got in contact with the appropriate spoke.  Patient denies HI/SI/AVH.

## 2022-05-12 NOTE — Progress Notes (Signed)
Established Patient Office Visit  Subjective   Patient ID: [name], male    DOB: 17-Jun-1966  Age: 56 y.o. MRN: [id number]  Chief Complaint  Patient presents with   Depression    Has not noticed any change in medication       Depression: States that he feels it is helping but not quite there yet. No side effects on swithcing the medications. No HI/SI/AVH. Currently on prozac '20mg'$   HTN/NSTEMI: States that he has not seen by  Dr Marlou Porch arppxo 6 months . State he will have DOE.  SHOB:States that he will get DOE. He does have his albuterol inahler. States that he has not been using the symbicort inhaler. Albuterol inhaler   OSA: Has a cpap but has not been using it. States that he does use it as it does not have a good seal and did not have insurance    Flu shot today        05/12/2022    9:12 AM 03/30/2022    3:13 PM 11/18/2021    4:52 PM 08/27/2021    3:51 PM  GAD 7 : Generalized Anxiety Score  Nervous, Anxious, on Edge '1 1 3 3  '$ Control/stop worrying '3 3 3 3  '$ Worry too much - different things '3 3 3 3  '$ Trouble relaxing '2 3 2 2  '$ Restless 2 2 0   Easily annoyed or irritable '1 3 3   '$ Afraid - awful might happen 0 3 0   Total GAD 7 Score '12 18 14   '$ Anxiety Difficulty Somewhat difficult Extremely difficult Extremely difficult          05/12/2022    9:09 AM 03/30/2022    3:13 PM 11/18/2021    4:52 PM  PHQ9 SCORE ONLY  PHQ-9 Total Score '13 26 17      '$ Review of Systems  Constitutional:  Negative for chills and fever.  Respiratory:  Positive for shortness of breath.   Cardiovascular:  Negative for chest pain.  Neurological:  Negative for dizziness and headaches.  Psychiatric/Behavioral:  Positive for depression.       Objective:     BP 138/78   Pulse 92   Temp (!) 96.7 F (35.9 C) (Temporal)   Ht '5\' 10"'$  (1.778 m)   Wt 246 lb (111.6 kg)   SpO2 95%   BMI 35.30 kg/m  BP Readings from Last 3 Encounters:  05/12/22 138/78  04/14/22 (!) 140/86  04/06/22 136/80    Wt Readings from Last 3 Encounters:  05/12/22 246 lb (111.6 kg)  04/14/22 242 lb 4 oz (109.9 kg)  04/06/22 237 lb 11.2 oz (107.8 kg)      Physical Exam Vitals and nursing note reviewed.  Constitutional:      Appearance: Normal appearance.  Cardiovascular:     Rate and Rhythm: Normal rate and regular rhythm.     Heart sounds: Normal heart sounds.  Pulmonary:     Effort: Pulmonary effort is normal.     Breath sounds: Wheezing and rhonchi present.  Musculoskeletal:     Right lower leg: No edema.     Left lower leg: Edema present.  Neurological:     Mental Status: He is alert.  Psychiatric:        Mood and Affect: Mood normal.        Behavior: Behavior normal.        Thought Content: Thought content normal.        Judgment: Judgment normal.  No results found for any visits on 05/12/22.    The ASCVD Risk score (Arnett DK, et al., 2019) failed to calculate for the following reasons:   The patient has a prior MI or stroke diagnosis    Assessment & Plan:   Problem List Items Addressed This Visit       Cardiovascular and Mediastinum   HTN (hypertension), benign (Chronic)    Patient's blood pressure within normal limits today.  Continue taking medication as prescribed.  He has not seen cardiology approximately 6 months.        Respiratory   Obstructive sleep apnea (Chronic)    He has a CPAP machine.  States that he the mask is ill fitting and causes leaks which then "feels in full of air".  Currently not using machine ambulatory referral to pulmonology for reevaluation and possible different masks      Relevant Orders   Ambulatory referral to Pulmonology     Other   Shortness of breath    Multifactorial given patient's body habitus, lung status, heart comorbidities.  We will refill Symbicort and the patient is insured.  If too expensive or unobtainable patient will reach out.  Can using albuterol inhaler as needed      Relevant Medications    budesonide-formoterol (SYMBICORT) 160-4.5 MCG/ACT inhaler   Depression, major, single episode, severe (HCC) - Primary    PHQ-9 and GAD-7 administered office.  Patient had a moderate reduction in scores on both surveys.  We will titrate patient from Prozac 20 mg to Prozac 40 mg.  Patient can take 2 of the 20 mg tablets until complete new prescription for Prozac 40 mg and today.  We will rerefer patient to therapy as he states he never got in contact with the appropriate spoke.  Patient denies HI/SI/AVH.      Relevant Medications   FLUoxetine (PROZAC) 40 MG capsule   Other Relevant Orders   Ambulatory referral to Psychology   Other Visit Diagnoses     Need for immunization against influenza       Relevant Orders   Flu Vaccine QUAD 4moIM (Fluarix, Fluzone & Alfiuria Quad PF) (Completed)       Return in about 6 weeks (around 06/23/2022) for MDD/Inhaler recheck .    MRomilda Garret NP

## 2022-05-12 NOTE — Assessment & Plan Note (Signed)
Patient's blood pressure within normal limits today.  Continue taking medication as prescribed.  He has not seen cardiology approximately 6 months.

## 2022-05-12 NOTE — Assessment & Plan Note (Signed)
He has a CPAP machine.  States that he the mask is ill fitting and causes leaks which then "feels in full of air".  Currently not using machine ambulatory referral to pulmonology for reevaluation and possible different masks

## 2022-05-13 NOTE — Telephone Encounter (Signed)
Pt called in stated the inhaler that was put in is still to expensive $95. Wants to know if there is an alternative . Please advise # [phone]

## 2022-05-13 NOTE — Telephone Encounter (Signed)
Called again today cell and home number and sent mychart message

## 2022-05-16 NOTE — Telephone Encounter (Signed)
This message should have gone to the CMA, not Referrals.

## 2022-05-17 NOTE — Telephone Encounter (Signed)
Can you offer any insight on an inhaler for the patient

## 2022-05-17 NOTE — Telephone Encounter (Signed)
I can't tell what his Part D plan is for prescription coverage. In general - even the covered inhalers range from $50-100 per inhaler. He can call his insurance (would need to be prescription plan, not the original Medicare) to find out if they have anything with a lower copay - it's possible they prefer Wixela over [place].  We can look into patient assistance - the only ICS/LABA that has assistance is Breo, but patients have to spend $600 out of pocket on Rx this year to qualify and I don't think he will - looks like all his other meds are generics.  Some LABA/LAMA combos have assistance programs as well - Bevespi, [employer]iol[employer] each have programs based on income.

## 2022-05-18 MED ORDER — FLUTICASONE-SALMETEROL 250-50 MCG/ACT IN AEPB: 1.0000 | INHALATION_SPRAY | Freq: Two times a day (BID) | RESPIRATORY_TRACT | 0 refills | Status: DC

## 2022-05-18 NOTE — Telephone Encounter (Signed)
He has not read the last mychart message. I have sent in a different one if this one is too expensive then he will need to call his prescription drug plan and see which ICS/LABA inhaler they prefer

## 2022-05-18 NOTE — Telephone Encounter (Signed)
Patient advised.

## 2022-05-24 ENCOUNTER — Encounter: Payer: Self-pay | Admitting: Acute Care

## 2022-05-24 ENCOUNTER — Ambulatory Visit (INDEPENDENT_AMBULATORY_CARE_PROVIDER_SITE_OTHER): Payer: Medicare HMO | Admitting: Acute Care

## 2022-05-24 VITALS — BP 110/80 | HR 93 | Ht 70.0 in | Wt 238.6 lb

## 2022-05-24 DIAGNOSIS — F17210 Nicotine dependence, cigarettes, uncomplicated: Secondary | ICD-10-CM

## 2022-05-24 DIAGNOSIS — R69 Illness, unspecified: Secondary | ICD-10-CM | POA: Diagnosis not present

## 2022-05-24 DIAGNOSIS — F17200 Nicotine dependence, unspecified, uncomplicated: Secondary | ICD-10-CM

## 2022-05-24 DIAGNOSIS — R0683 Snoring: Secondary | ICD-10-CM | POA: Diagnosis not present

## 2022-05-24 NOTE — Patient Instructions (Addendum)
We will order a home sleep study We are currently running about 3 months out due to demand.  We will schedule you for an office visit once we get the results of your study, to review them with you.  Follow up after home sleep study Remember to establish a good bedtime routine, and work on sleep hygiene.  Limit daytime naps , avoid stimulants such as caffeine and nicotine close to bedtime, exercise daily to promote sleep quality, avoid heavy , spicy, fried , or rich foods before bed.  Ensure adequate exposure to natural light during the day,establish a relaxing bedtime routine with a pleasant sleep environment ( Bedroom between 60 and 67 degrees, turn off bright lights , TV or device screens screens , consider black out curtains or white noise machines) Do not drive if sleepy. Follow up with Judson Roch NP after home sleep study. We will refer to lung cancer screening You will get a call to schedule a shared decision making visit and CT Chest.    You can receive free nicotine replacement therapy ( patches, gum or mints) by calling 1-800-QUIT NOW. Please call so we can get you on the path to becoming  a non-smoker. I know it is hard, but you can do this!   Hypnosis for smoking cessation  [place]  Acupuncture for smoking cessation  [facility] [phone]   Please contact office for sooner follow up if symptoms do not improve or worsen or seek emergency care

## 2022-05-24 NOTE — Progress Notes (Signed)
History of Present Illness [name] is a 56 y.o. male current every day smoker with OSA on CPAP with break in therapy. He was referred by his PCP Dr. Karl Ito. He has a history of MI, htn  and heart disease.   Referred by PCP. Previously diagnosed , on CPAP but break in therapy due to poor mask fit and no insurance.    05/24/2022 Pt. Presents for sleep consult. Patient was diagnosed with mild OSA In 2015, but has had a break on therapy due to mask leak and no insurance to get new supplies. He returns today with symptoms of untreated OSA to include daytime sleepiness, witnessed apneas by family,  and poor sleep quality. He has not used his CPAP machine in 2  years. He has a gotee, and has a hard time with mask seal. Due to lack of insurance he has not been able to get additional supplies.  He is unable to sleep. He states he is sometimes awake for 48-72 hours. Then he falls asleep for 24 + hours. He does not feel his sleep quality is good when he does sleep. Unsure if this is just his OSA or OSA and insomnia, but will re-evaluate with home sleep study as he has had 26 pound weigh gain since last  study. I gave him a mask from the sample closet to use while he is waiting to get his home sleep study scheduled as he may have a long wait. We discussed cleaning his CPAP device thoroughly before using it again. He verbalized understanding  Sleep questionnaire Symptoms-  Snoring and witnessed apnea per family Prior sleep study-  Yes Bedtime- 11 pm Time to fall asleep- hours Nocturnal awakenings- 5-6 times Out of bed/start of day- 7 am Weight changes- weight gain of about 26 pounds Do you operate heavy machinery- No Do you currently wear CPAP- No Do you current wear oxygen- No Epworth-  18     05/24/2022    2:00 PM  Results of the Epworth flowsheet  Sitting and reading 3  Watching TV 3  Sitting, inactive in a public place (e.g. a theatre or a meeting) 1  As a passenger in a car for an  hour without a break 3  Lying down to rest in the afternoon when circumstances permit 3  Sitting and talking to someone 1  Sitting quietly after a lunch without alcohol 3  In a car, while stopped for a few minutes in traffic 1  Total score 18     Test Results: Sleep Study 08/28/2013 Mild OSA 15 obstructive events/ 13 Central events/ 11 mixed events AHI of 7.7 SaO2 nadir 85%, Average sat 93%.     Latest Ref Rng & Units 04/01/2022    6:52 PM 04/01/2022    6:44 PM 09/07/2021    3:12 PM  CBC  WBC 4.0 - 10.5 K/uL  7.4  8.7   Hemoglobin 13.0 - 17.0 g/dL 13.9  13.8  15.2   Hematocrit 39.0 - 52.0 % 41.0  41.4  44.4   Platelets 150 - 400 K/uL  180  214        Latest Ref Rng & Units 04/01/2022    6:52 PM 04/01/2022    6:44 PM 09/06/2021   10:58 AM  BMP  Glucose 70 - 99 mg/dL 128  139  86   BUN 6 - 20 mg/dL '15  15  17   '$ Creatinine 0.61 - 1.24 mg/dL 1.20  1.33  1.30   Sodium 135 - 145 mmol/L 142  140  139   Potassium 3.5 - 5.1 mmol/L 3.9  3.8  3.5   Chloride 98 - 111 mmol/L 105  107  102   CO2 22 - 32 mmol/L  23  32   Calcium 8.9 - 10.3 mg/dL  8.2  9.2     BNP    Component Value Date/Time   BNP 137 (H) 08/27/2021 1502    ProBNP No results found for: "PROBNP"  PFT No results found for: "FEV1PRE", "FEV1POST", "FVCPRE", "FVCPOST", "TLC", "DLCOUNC", "PREFEV1FVCRT", "PSTFEV1FVCRT"  No results found.   Past medical hx Past Medical History:  Diagnosis Date   Acute kidney failure ([place])    after last surgery   Alcohol abuse, in remission 11/03/2015   Aortic root dilation (HCC)    Chronic kidney disease, stage 2 (mild)    Chronic lower back pain    Cocaine abuse ([place]) 11/03/2015   Depression    GERD (gastroesophageal reflux disease)    occasionally and will take Rolaid if needed   Hemorrhoids    History of kidney stones    History of MRSA infection    several yrs ago   Hypertension    OSA on CPAP    study was done 3 yrs ago.  Wears a cpap   Polysubstance dependence,  non-opioid, episodic (HCC)    RSD (reflex sympathetic dystrophy), L foot due to calcaneus fx 08/14/2012     Social History   Tobacco Use   Smoking status: Every Day    Packs/day: 1.00    Years: 40.00    Total pack years: 40.00    Types: Cigarettes   Smokeless tobacco: Former    Types: Chew, Snuff   Tobacco comments:    Smokes 7 packs of cigarettes a week. 05/24/2022 Tay  Vaping Use   Vaping Use: Never used  Substance Use Topics   Alcohol use: Not Currently    Alcohol/week: 0.0 standard drinks of alcohol    Comment: 11/24/2017  "stopped drinking in 2010 mostly;. Recovering drug and alcohol addict."   Drug use: Not Currently    Types: Cocaine, Marijuana    Comment: 11/2017 "last drug use was 11/17/2017"    [name] reports that he has been smoking cigarettes. He has a 40.00 pack-year smoking history. He has quit using smokeless tobacco.  His smokeless tobacco use included chew and snuff. He reports that he does not currently use alcohol. He reports that he does not currently use drugs after having used the following drugs: Cocaine and Marijuana.  Tobacco Cessation: Current Every day smoker , 40 pack years . Qualifies for lung cancer screening  Past surgical hx, Family hx, Social hx all reviewed.  Current Outpatient Medications on File Prior to Visit  Medication Sig   albuterol (VENTOLIN HFA) 108 (90 Base) MCG/ACT inhaler Inhale 2 puffs into the lungs every 6 (six) hours as needed for wheezing or shortness of breath.   fluticasone (FLONASE) 50 MCG/ACT nasal spray Place 2 sprays into both nostrils daily as needed for allergies or rhinitis.   amLODipine (NORVASC) 10 MG tablet Take 1 tablet by mouth once daily   aspirin 81 MG EC tablet Take 1 tablet (81 mg total) by mouth daily. Swallow whole.   busPIRone (BUSPAR) 10 MG tablet Take 1 tablet (10 mg total) by mouth 3 (three) times daily.   clopidogrel (PLAVIX) 75 MG tablet Take 75 mg by mouth daily.   EPINEPHRINE 0.3 mg/0.3 mL IJ  SOAJ  injection INJECT 0.3 MLS (0.3 MG TOTAL) INTO THE MUSCLE AS NEEDED FOR ANAPHYLAXIS.   FLUoxetine (PROZAC) 40 MG capsule Take 1 capsule (40 mg total) by mouth daily.   fluticasone-salmeterol (WIXELA INHUB) 250-50 MCG/ACT AEPB Inhale 1 puff into the lungs in the morning and at bedtime. (Patient not taking: Reported on 05/24/2022)   irbesartan (AVAPRO) 150 MG tablet Take 1 tablet (150 mg total) by mouth daily.   isosorbide mononitrate (IMDUR) 60 MG 24 hr tablet TAKE 1 & 1/2 (ONE & ONE-HALF) TABLETS BY MOUTH ONCE DAILY   nitroGLYCERIN (NITROSTAT) 0.4 MG SL tablet DISSOLVE ONE TABLET UNDER THE TONGUE EVERY 5 MINUTES AS NEEDED FOR CHEST PAIN.  DO NOT EXCEED A TOTAL OF 3 DOSES IN 15 MINUTES   rosuvastatin (CRESTOR) 20 MG tablet Take 1 tablet by mouth once daily   [DISCONTINUED] diphenhydrAMINE (BENADRYL) 25 MG tablet Take 1 tablet (25 mg total) by mouth every 6 (six) hours as needed for up to 5 days.   No current facility-administered medications on file prior to visit.     Allergies  Allergen Reactions   Bee Venom Swelling    Severe swelling   Fish Allergy Anaphylaxis    Throat swelling with seafood   Iodinated Contrast Media Anaphylaxis   Latex Anaphylaxis   Penicillins Anaphylaxis    Has patient had a PCN reaction causing immediate rash, facial/tongue/throat swelling, SOB or lightheadedness with hypotension: Yes Has patient had a PCN reaction causing severe rash involving mucus membranes or skin necrosis: No Has patient had a PCN reaction that required hospitalization No Has patient had a PCN reaction occurring within the last 10 years: No If all of the above answers are "NO", then may proceed with Cephalosporin use.    Bee Venom    Iodinated Contrast Media     CARDIAC ARREST   Latex    Penicillins     Review Of Systems:  Constitutional:   No  weight loss, night sweats,  Fevers, chills,+ fatigue, or  lassitude.  HEENT:   No headaches,  Difficulty swallowing,  Tooth/dental problems, or   Sore throat,                No sneezing, itching, ear ache, nasal congestion, post nasal drip,   CV:  No chest pain,  Orthopnea, PND, swelling in lower extremities, anasarca, dizziness, palpitations, syncope.   GI  No heartburn, indigestion, abdominal pain, nausea, vomiting, diarrhea, change in bowel habits, loss of appetite, bloody stools.   Resp: No shortness of breath with exertion or at rest.  No excess mucus, no productive cough,  No non-productive cough,  No coughing up of blood.  No change in color of mucus.  No wheezing.  No chest wall deformity, + snoring  Skin: no rash or lesions.  GU: no dysuria, change in color of urine, no urgency or frequency.  No flank pain, no hematuria   MS:  No joint pain or swelling.  No decreased range of motion.  No back pain.  Psych:  No change in mood or affect. No depression or anxiety.  No memory loss.   Vital Signs BP 110/80 (BP Location: Right Arm)   Pulse 93   Ht '5\' 10"'$  (1.778 m)   Wt 238 lb 9.6 oz (108.2 kg)   SpO2 96%   BMI 34.24 kg/m    Physical Exam:  General- No distress,  A&Ox3, pleasant ENT: No sinus tenderness, TM clear, pale nasal mucosa, no oral exudate,no post nasal  drip, no LAN. Cardiac: S1, S2, regular rate and rhythm, no murmur Chest: No wheeze/ rales/ dullness; no accessory muscle use, no nasal flaring, no sternal retractions Abd.: Soft Non-tender, ND, BS +, Body mass index is 34.24 kg/m.  Ext: No clubbing cyanosis, edema Neuro:  normal strength, MAE x 4, A&O x 3 Skin: No rashes, warm and dry, NO lesions  Psych: normal mood and behavior   Assessment/Plan Suspected OSA in patient with previously diagnosed OSA with break in therapy Plan We will order a home sleep study We are currently running about 3 months out due to demand.  We will schedule you for an office visit once we get the results of your study, to review them with you.  Follow up after home sleep study Remember to establish a good bedtime routine,  and work on sleep hygiene.  Limit daytime naps , avoid stimulants such as caffeine and nicotine close to bedtime, exercise daily to promote sleep quality, avoid heavy , spicy, fried , or rich foods before bed.  Ensure adequate exposure to natural light during the day,establish a relaxing bedtime routine with a pleasant sleep environment ( Bedroom between 60 and 67 degrees, turn off bright lights , TV or device screens screens , consider black out curtains or white noise machines) Do not drive if sleepy. We will give you a sample nasal pillow to use while you are waiting to get your home sleep study. Remember to clean  you machine with soapy water, tubing, filter and reservoir.  Follow up with Judson Roch NP after home sleep study. Please contact office for sooner follow up if symptoms do not improve or worsen or seek emergency care     Current every day smoker 40 pack year smoking history Plan We will refer to lung cancer screening You will get a call to schedule a shared decision making visit and CT Chest.   I spent 40  minutes dedicated to the care of this patient on the date of this encounter to include pre-visit review of records, face-to-face time with the patient discussing conditions above, post visit ordering of testing, clinical documentation with the electronic health record, making appropriate referrals as documented, and communicating necessary information to the patient's healthcare team.     Magdalen Spatz, NP 05/24/2022  4:58 PM

## 2022-05-25 NOTE — Progress Notes (Signed)
Reviewed and agree with assessment/plan.   Chesley Mires, MD [facility] Pulmonary/Critical Care 05/25/2022, 8:13 AM Pager:  [phone]

## 2022-05-30 ENCOUNTER — Other Ambulatory Visit: Payer: Self-pay | Admitting: Cardiology

## 2022-06-03 ENCOUNTER — Ambulatory Visit: Payer: Medicare HMO | Admitting: Clinical

## 2022-06-08 ENCOUNTER — Other Ambulatory Visit: Payer: Self-pay | Admitting: Nurse Practitioner

## 2022-06-08 DIAGNOSIS — F419 Anxiety disorder, unspecified: Secondary | ICD-10-CM

## 2022-06-16 ENCOUNTER — Ambulatory Visit (INDEPENDENT_AMBULATORY_CARE_PROVIDER_SITE_OTHER): Payer: Medicare HMO

## 2022-06-16 DIAGNOSIS — R0683 Snoring: Secondary | ICD-10-CM

## 2022-06-23 ENCOUNTER — Ambulatory Visit: Payer: Medicare Other | Admitting: Nurse Practitioner

## 2022-07-05 ENCOUNTER — Ambulatory Visit: Payer: Medicare HMO | Admitting: Acute Care

## 2022-07-06 ENCOUNTER — Encounter: Payer: Self-pay | Admitting: Acute Care

## 2022-07-07 NOTE — Progress Notes (Signed)
Patient refused/never wore monitor. Order will be canceled

## 2022-07-29 ENCOUNTER — Other Ambulatory Visit: Payer: Self-pay

## 2022-07-29 MED ORDER — ROSUVASTATIN CALCIUM 20 MG PO TABS: 20.0000 mg | ORAL_TABLET | Freq: Every day | ORAL | 0 refills | Status: DC

## 2022-08-10 ENCOUNTER — Other Ambulatory Visit: Payer: Self-pay | Admitting: Nurse Practitioner

## 2022-08-10 DIAGNOSIS — F419 Anxiety disorder, unspecified: Secondary | ICD-10-CM

## 2022-09-05 ENCOUNTER — Telehealth: Payer: Self-pay | Admitting: Nurse Practitioner

## 2022-09-05 ENCOUNTER — Encounter: Payer: Self-pay | Admitting: Nurse Practitioner

## 2022-09-05 ENCOUNTER — Ambulatory Visit (INDEPENDENT_AMBULATORY_CARE_PROVIDER_SITE_OTHER): Payer: Medicare HMO | Admitting: Nurse Practitioner

## 2022-09-05 ENCOUNTER — Ambulatory Visit: Payer: Medicare HMO | Admitting: Nurse Practitioner

## 2022-09-05 VITALS — BP 138/72 | HR 88 | Temp 97.8°F | Resp 16 | Ht 70.0 in | Wt 233.4 lb

## 2022-09-05 DIAGNOSIS — F4321 Adjustment disorder with depressed mood: Secondary | ICD-10-CM | POA: Insufficient documentation

## 2022-09-05 DIAGNOSIS — R69 Illness, unspecified: Secondary | ICD-10-CM | POA: Diagnosis not present

## 2022-09-05 DIAGNOSIS — R0602 Shortness of breath: Secondary | ICD-10-CM

## 2022-09-05 DIAGNOSIS — F331 Major depressive disorder, recurrent, moderate: Secondary | ICD-10-CM

## 2022-09-05 DIAGNOSIS — Z1211 Encounter for screening for malignant neoplasm of colon: Secondary | ICD-10-CM

## 2022-09-05 MED ORDER — TRELEGY ELLIPTA 100-62.5-25 MCG/ACT IN AEPB: 1.0000 | INHALATION_SPRAY | Freq: Every day | RESPIRATORY_TRACT | 11 refills | Status: DC

## 2022-09-05 NOTE — Patient Instructions (Signed)
Nice to see you today I want to see you in 3 months for your physical and labs.  I have sent [name] a message to get you for in person visits Let me know If you need me before

## 2022-09-05 NOTE — Assessment & Plan Note (Signed)
Patient lost his mother at the end of December.  Condolences given in office

## 2022-09-05 NOTE — Progress Notes (Signed)
Established Patient Office Visit  Subjective   Patient ID: [name], male    DOB: 12-24-1965  Age: 57 y.o. MRN: [id number]  Chief Complaint  Patient presents with   Depression   Anxiety      MDD/GAD: Patient was seen by me on 05/12/2022.  At that juncture patient was on Prozac 20 mg.  The office visit we did titrate patient up to Prozac 40 mg.  Patient was also referred to therapy. States tah he was able to start the proxac 44m . States that he had days that he was unable to take regularly. States that since the beginning of February he has been taking the medication more regularly missing approximately 1 day a week.states that he has been taking the buspirone 10 mg 3 times daily and doing well.  States that he has not started the therpay yet but is interested.  Patient states he recently lost his mother at the end of December she was going back and forth for approximately 2 months battling illness.  Patient states she was 57years of age but he was terribly close with his mother.  He is still dealing with some grief in that regard  Shortness of breath: Patient has been followed by Dr. SMarlou Porchcardiologist in the past.  At last office visit he had not been seen in approximate 6 months.  He does have a history of hypertension and NSTEMI.  He is having dyspnea on exertion.  He did have an albuterol inhaler that he was using.  At that juncture he was not using his Symbicort.  Patient was unable to afford Symbicort at 1 point in time but has gotten health insurance since.  Patient did pick up Wixela inhaler and has been using as prescribed.  States he still using albuterol 2-3 times a day.  Patient also mentions he does have an appoint with his cardiologist in April 2024     09/05/2022    3:41 PM 05/12/2022    9:09 AM 03/30/2022    3:13 PM  PHQ9 SCORE ONLY  PHQ-9 Total Score 25 13 26        $ 09/05/2022    3:41 PM 05/12/2022    9:12 AM 03/30/2022    3:13 PM 11/18/2021    4:52 PM  GAD 7 :  Generalized Anxiety Score  Nervous, Anxious, on Edge 3 1 1 3  $ Control/stop worrying 3 3 3 3  $ Worry too much - different things 3 3 3 3  $ Trouble relaxing 3 2 3 2  $ Restless 3 2 2 $ 0  Easily annoyed or irritable 3 1 3 3  $ Afraid - awful might happen 1 0 3 0  Total GAD 7 Score 19 12 18 14  $ Anxiety Difficulty Extremely difficult Somewhat difficult Extremely difficult Extremely difficult       Review of Systems  Constitutional:  Negative for chills and fever.  Respiratory:  Positive for shortness of breath.   Cardiovascular:  Negative for chest pain.  Psychiatric/Behavioral:  Positive for depression. Negative for hallucinations and suicidal ideas. The patient has insomnia.       Objective:     BP 138/72   Pulse 88   Temp 97.8 F (36.6 C)   Resp 16   Ht 5' 10"$  (1.778 m)   Wt 233 lb 6 oz (105.9 kg)   SpO2 97%   BMI 33.49 kg/m  BP Readings from Last 3 Encounters:  09/05/22 138/72  05/24/22 110/80  05/12/22 138/78  Wt Readings from Last 3 Encounters:  09/05/22 233 lb 6 oz (105.9 kg)  05/24/22 238 lb 9.6 oz (108.2 kg)  05/12/22 246 lb (111.6 kg)      Physical Exam Vitals and nursing note reviewed.  Constitutional:      Appearance: Normal appearance.  Cardiovascular:     Rate and Rhythm: Normal rate and regular rhythm.     Heart sounds: Normal heart sounds.  Pulmonary:     Effort: Pulmonary effort is normal.     Breath sounds: Normal breath sounds.  Musculoskeletal:     Right lower leg: No edema.     Left lower leg: No edema.  Neurological:     Mental Status: He is alert.      No results found for any visits on 09/05/22.    The ASCVD Risk score (Arnett DK, et al., 2019) failed to calculate for the following reasons:   The patient has a prior MI or stroke diagnosis    Assessment & Plan:   Problem List Items Addressed This Visit       Other   Major depressive disorder, recurrent episode, moderate (Nixa) - Primary (Chronic)    Patient had difficulty  there for about 2 months taking medication as prescribed and then recently lost his mother.  PHQ-9 and GAD-7 scores have increased likely secondary to grief.  Patient has no active SI/HI/AVH.  Did discuss when to be seen urgent or emergently.  Will recommend patient starting therapy especially in the setting of an acute loss of his mother      Shortness of breath    Patient was able to get Wixela inhaler and been using it as prescribed without great relief.  Will discontinue Wixela and try Trelegy.      Relevant Medications   Fluticasone-Umeclidin-Vilant (TRELEGY ELLIPTA) 100-62.5-25 MCG/ACT AEPB   Grief    Patient lost his mother at the end of December.  Condolences given in office      Other Visit Diagnoses     Screening for colon cancer       Relevant Orders   Ambulatory referral to Gastroenterology       Return in about 3 months (around 12/04/2022) for CPE and Labs.    Romilda Garret, NP

## 2022-09-05 NOTE — Assessment & Plan Note (Signed)
Patient was able to get Wixela inhaler and been using it as prescribed without great relief.  Will discontinue Wixela and try Trelegy.

## 2022-09-05 NOTE — Assessment & Plan Note (Signed)
Patient had difficulty there for about 2 months taking medication as prescribed and then recently lost his mother.  PHQ-9 and GAD-7 scores have increased likely secondary to grief.  Patient has no active SI/HI/AVH.  Did discuss when to be seen urgent or emergently.  Will recommend patient starting therapy especially in the setting of an acute loss of his mother

## 2022-09-05 NOTE — Telephone Encounter (Signed)
I have referred him to you. Not sure where you are in that process but he states that he would like in person sessions

## 2022-09-11 ENCOUNTER — Other Ambulatory Visit: Payer: Self-pay | Admitting: Nurse Practitioner

## 2022-09-11 ENCOUNTER — Other Ambulatory Visit: Payer: Self-pay | Admitting: Cardiology

## 2022-09-11 DIAGNOSIS — F322 Major depressive disorder, single episode, severe without psychotic features: Secondary | ICD-10-CM

## 2022-09-27 ENCOUNTER — Ambulatory Visit: Payer: Medicare HMO | Admitting: Nurse Practitioner

## 2022-10-03 ENCOUNTER — Encounter: Payer: Self-pay | Admitting: Nurse Practitioner

## 2022-10-03 ENCOUNTER — Ambulatory Visit (INDEPENDENT_AMBULATORY_CARE_PROVIDER_SITE_OTHER): Payer: Medicare HMO | Admitting: Nurse Practitioner

## 2022-10-03 VITALS — BP 154/74 | HR 81 | Temp 98.4°F | Resp 16 | Ht 70.0 in | Wt 234.4 lb

## 2022-10-03 DIAGNOSIS — I952 Hypotension due to drugs: Secondary | ICD-10-CM

## 2022-10-03 DIAGNOSIS — R55 Syncope and collapse: Secondary | ICD-10-CM

## 2022-10-03 MED ORDER — AMLODIPINE BESYLATE 10 MG PO TABS: 5.0000 mg | ORAL_TABLET | Freq: Every day | ORAL | 1 refills | Status: DC

## 2022-10-03 NOTE — Progress Notes (Signed)
Established Patient Office Visit  Subjective   Patient ID: [name], male    DOB: 1965/07/29  Age: 57 y.o. MRN: [id number]  Chief Complaint  Patient presents with   Hypertension    Been running high 188/101 Low 90/48      Blood pressure issue: patient has history of htn and is mantained on amlodipine, irbesartan and isosorbide. States that he has been having low in the morning. States that he has passed out and feel in the yard. States that he has taken his blood pressure prior to taking his medication. He will hold the medication if he is havin low blood pressure.  States that it happens most time is when he gets up and moves around.   States that on Thursday he was outside and passed out on 09/30/2022. States that his girl friend found him in the yard after he passed out  Patient has not taken any blood pressure medication today    Review of Systems  Constitutional:  Negative for chills and fever.  Respiratory:  Negative for shortness of breath.   Cardiovascular:  Negative for chest pain.  Neurological:  Positive for dizziness and loss of consciousness. Negative for headaches.      Objective:     BP (!) 154/74   Pulse 81   Temp 98.4 F (36.9 C)   Resp 16   Ht 5\' 10"  (1.778 m)   Wt 234 lb 6 oz (106.3 kg)   SpO2 97%   BMI 33.63 kg/m    Physical Exam Vitals and nursing note reviewed.  Constitutional:      Appearance: Normal appearance.  Cardiovascular:     Rate and Rhythm: Normal rate and regular rhythm.     Heart sounds: Normal heart sounds.  Pulmonary:     Effort: Pulmonary effort is normal.     Comments: Decreased globally  Neurological:     General: No focal deficit present.     Mental Status: He is alert.     Cranial Nerves: Cranial nerves 2-12 are intact.     Sensory: Sensation is intact.     Motor: Motor function is intact.     Gait: Gait is intact.     Deep Tendon Reflexes:     Reflex Scores:      Bicep reflexes are 2+ on the right side and  2+ on the left side.      Patellar reflexes are 2+ on the right side and 2+ on the left side.    Comments: Bilateral upper extremity strength 5/5  RLE 5/5  LLE 4/5 baseline per patient       No results found for any visits on 10/03/22.    The ASCVD Risk score (Arnett DK, et al., 2019) failed to calculate for the following reasons:   The patient has a prior MI or stroke diagnosis    Assessment & Plan:   Problem List Items Addressed This Visit       Cardiovascular and Mediastinum   Hypotension due to drugs    Patient brought in readings that range from the 180s down to the 123XX123 systolically.  Hypotension was in the mornings and he had that sensation of lightheadedness when he would get up.  Orthostatics negative in office today.  Patient does drink many [employer] a day and not very much water.  Told patient he needs to drink more water and less soda.  Will check basic blood.  Will have his amlodipine from 10  mg to 5 mg follow-up in 2 weeks      Relevant Medications   amLODipine (NORVASC) 10 MG tablet   Other Relevant Orders   Orthostatic vital signs   CBC   Comprehensive metabolic panel   TSH     Other   Syncope - Primary    Abrasion to left frontal forehead.  Neurological exam within normal limits per patient.  No headaches no red flag symptoms.  Strict precautions reviewed when to be seen emergently      Relevant Orders   Orthostatic vital signs   CBC   Comprehensive metabolic panel   TSH    Return in about 2 weeks (around 10/17/2022) for BP recheck.    Romilda Garret, NP

## 2022-10-03 NOTE — Assessment & Plan Note (Signed)
Abrasion to left frontal forehead.  Neurological exam within normal limits per patient.  No headaches no red flag symptoms.  Strict precautions reviewed when to be seen emergently

## 2022-10-03 NOTE — Assessment & Plan Note (Signed)
Patient brought in readings that range from the 180s down to the 123XX123 systolically.  Hypotension was in the mornings and he had that sensation of lightheadedness when he would get up.  Orthostatics negative in office today.  Patient does drink many [employer] a day and not very much water.  Told patient he needs to drink more water and less soda.  Will check basic blood.  Will have his amlodipine from 10 mg to 5 mg follow-up in 2 weeks

## 2022-10-03 NOTE — Patient Instructions (Signed)
Continue your medications I am going to half the amlodipine to 5mg .  You need to drink more water and less soda.  Follow up with me in 2 weeks  If you start acting funny, get a headache, speech changes, vision changes we need to get a CT of your head

## 2022-10-04 ENCOUNTER — Other Ambulatory Visit: Payer: Self-pay | Admitting: Physician Assistant

## 2022-10-04 LAB — COMPREHENSIVE METABOLIC PANEL
ALT: 22 U/L (ref 0–53)
AST: 16 U/L (ref 0–37)
Albumin: 4 g/dL (ref 3.5–5.2)
Alkaline Phosphatase: 116 U/L (ref 39–117)
BUN: 16 mg/dL (ref 6–23)
CO2: 26 mEq/L (ref 19–32)
Calcium: 9.3 mg/dL (ref 8.4–10.5)
Chloride: 105 mEq/L (ref 96–112)
Creatinine, Ser: 1.12 mg/dL (ref 0.40–1.50)
GFR: 73.25 mL/min (ref >60.00)
Glucose, Bld: 85 mg/dL (ref 70–99)
Potassium: 4.1 mEq/L (ref 3.5–5.1)
Sodium: 140 mEq/L (ref 135–145)
Total Bilirubin: 0.4 mg/dL (ref 0.2–1.2)
Total Protein: 6.5 g/dL (ref 6.0–8.3)

## 2022-10-04 LAB — CBC
HCT: 51.7 % (ref 39.0–52.0)
Hemoglobin: 17.2 g/dL — ABNORMAL HIGH (ref 13.0–17.0)
MCHC: 33.3 g/dL (ref 30.0–36.0)
MCV: 90.3 fl (ref 78.0–100.0)
Platelets: 200 10*3/uL (ref 150.0–400.0)
RBC: 5.73 Mil/uL (ref 4.22–5.81)
RDW: 13.7 % (ref 11.5–15.5)
WBC: 7.1 10*3/uL (ref 4.0–10.5)

## 2022-10-04 LAB — TSH: TSH: 1.98 u[IU]/mL (ref 0.35–5.50)

## 2022-10-19 ENCOUNTER — Ambulatory Visit: Payer: Medicare HMO | Admitting: Nurse Practitioner

## 2022-10-19 NOTE — Progress Notes (Unsigned)
Cardiology Clinic Note   Date: 10/20/2022 ID: ADONY BURNINGHAM, DOB 12/16/1965, MRN [id number]  Primary Cardiologist:  Candee Furbish, MD  Patient Profile    [name] is a 57 y.o. male who presents to the clinic today for concerns of chest pain and shortness of breath.  Past medical history significant for: CAD. LHC 05/03/2021 (NSTEMI): Ramus 80%.  Mid CX 80%.  Proximal LAD 50%.  PCI with DES to mid LCx, DES ostial ramus. Echo 09/07/2021: EF 60 to 65%.  Mild LAE.  Trivial MR.  Moderate aortic valve calcification.  Mild AS.  Mild dilatation of ascending aorta 38 mm. LHC 09/15/2021 (angina): Proximal LAD 30%.  Patent stents ramus and LCx. Hypertension. Polysubstance abuse. Tobacco abuse. CKD stage IIIa. Prediabetes. GERD. OSA. Syncope.   History of Present Illness    [name] was first evaluated by Dr. Marlou Porch on 05/10/2019 for syncope at the request of Dr. Edilia Bo.  Echo showed normal LV function with mild to moderate aortic valve sclerosis/calcification without stenosis, and mild dilatation of aortic root 38 mm.  14 day heart monitor was ordered but does not appear to have been completed.  Patient was last evaluated in the office by Dr. Marlou Porch on 10/14/2021 to follow-up after heart catheterization that showed proximal LAD 30% and patent stents.  Patient continued to complain of chest pain with anxiety.  No medication changes were made.  Today, patient is alone.  He comes in today with various complaints.  He initially reports episodes of hypotension with BP getting as low as 80/40.  He states preceding these episodes he has a high-pitched ringing in his ear, his arms become heavy and his jaw hurts prompting him to check his BP.  This will last 20 to 30 minutes and then resolved.  He will check his BP again and it can be as high as 190/110 or in the normal range of 130/70.  If BP is high he will have associated headache and dizziness that lasts approximately an hour before his BP normalizes  and it resolves.  He was evaluated by his PCP 2 weeks ago for this complaint and amlodipine was cut in half.  He thinks this has improved somewhat since.  Patient also complains of constant shortness of breath that worsens with exertion.  His PCP recently started him on Trelegy and albuterol but he does not feel these are helping.  Patient also complains of a 1 to 61-month history of episodes of central chest pain and pain down left arm that comes on with stress or exertion and lasts until he takes nitro (1-2 doses).  These episodes happen 2-3 times a week and are similar to prior anginal pain.  Patient continues to smoke a pack a day.  He reports last cocaine use 1.5 weeks ago.  He denies any other substances including alcohol.  Patient is on disability for a crush injury to his left leg.  He denies right lower extremity edema (left leg bigger than right secondary to injury), orthopnea, or PND.  Patient is very agitated and fidgety throughout exam.  Patient denied withdrawing from drugs or alcohol.  Had a very long discussion about dangers of drug use related to his cardiac health.  He reports he has reduced his use from daily to monthly since his MI in 2022.    ROS: All other systems reviewed and are otherwise negative except as noted in History of Present Illness.  Studies Reviewed    ECG personally reviewed  by me today: NSR, 93 bpm.  No significant changes from 10/14/2021.       Physical Exam    VS:  BP 138/78 (BP Location: Left Arm, Patient Position: Sitting, Cuff Size: Large)   Pulse 93   Ht 5\' 10"  (1.778 m)   Wt 235 lb (106.6 kg)   BMI 33.72 kg/m  , BMI Body mass index is 33.72 kg/m.  GEN: Well nourished, well developed, in no acute distress.  Patient is very agitated and fidgety throughout exam. Neck: No JVD or carotid bruits. Cardiac:  RRR. No murmurs. No rubs or gallops.   Respiratory:  Respirations regular and unlabored.  Diminished breath sounds throughout without rales, wheezing or  rhonchi. GI: Soft, nontender, nondistended. Extremities: Radials/DP/PT 2+ and equal bilaterally. No clubbing or cyanosis. No edema  Skin: Warm and dry, no rash. Neuro: Strength intact.  Assessment & Plan   CAD/angina/DOE.  S/p PCI with DES to mid LCx and DES to ostial ramus October 2022.  LHC March 2023 showed patent stents.  Patient reports a 1 to 73-month history of central chest pain and left arm pain occurring with stress or exertion and resolving after 1-2 doses of NTG.  This occurs 2-3 times a week and feels like previous anginal pain.  Patient also reports constant shortness of breath that increases with exertion.  This is not necessarily related to chest pain, as he feels it is constant.  Will get echo to evaluate DOE.  Will get nuclear stress test.  Patient cannot walk on treadmill secondary to left leg injury.  Discussed the importance of abstaining from cocaine.  Will trial Ranexa 500 mg twice daily.  Continue aspirin, Plavix, isosorbide, rosuvastatin, as needed SL NTG.  Patient not on beta-blocker secondary to history of cocaine abuse.  Patient provided with ED precautions. Hypertension.  BP today 138/78 on intake 130/72 on recheck.  Patient reports episodes of hypotension with BP dropping to the 80s over 40s with associated arm heaviness and jaw pain.  Episodes are preceded by a high-pitched sound in his ear which prompted him to check his BP.  These episodes last 20 to 30 minutes and resolve on their own.  He will then check his BP and it will be as high as 190/110 or normal.  He reports headaches and dizziness associated with high blood pressures lasting approximately an hour and resolving on their own before his BP normalizes.  Patient was evaluated by PCP and amlodipine was decreased.  He feels these episodes have improved since then.  Continue amlodipine, irbesartan. Shortness of breath.  Patient reports constant shortness of breath (see #1).  Patient's PCP recently started him on Trelegy  and albuterol but he does not feel they are helping.  Breath sounds are diminished throughout without rhonchi, rales, or crackles.  He is encouraged to continue using inhalers and return to PCP if not improved. Polysubstance abuse.  Patient reports last cocaine use 1.5 weeks ago.  He reports decreasing his use from daily to once a month since his MI in 2022.  Discussed at length the dangers of cocaine and his cardiac health.  He is encouraged to stop using cocaine completely.  He denies use of alcohol or any other substances. Tobacco abuse.  Patient smokes 1 pack/day.  Encourage patient to make a plan to start reducing his cigarettes.  He is interested in starting Chantix.  We will discuss this when he returns for follow-up after testing.  Disposition: Echo.  Nuclear stress test.  Ranexa 500 mg twice daily.  Return in 3 to 4 weeks after testing or sooner as needed.     Shared Decision Making/Informed Consent The risks [chest pain, shortness of breath, cardiac arrhythmias, dizziness, blood pressure fluctuations, myocardial infarction, stroke/transient ischemic attack, nausea, vomiting, allergic reaction, radiation exposure, metallic taste sensation and life-threatening complications (estimated to be 1 in 10,000)], benefits (risk stratification, diagnosing coronary artery disease, treatment guidance) and alternatives of a nuclear stress test were discussed in detail with Mr. Schwede and he agrees to proceed.   Signed, Justice Britain. Jonise Weightman, DNP, NP-C

## 2022-10-20 ENCOUNTER — Ambulatory Visit: Payer: Medicare HMO | Attending: Cardiology | Admitting: Student

## 2022-10-20 ENCOUNTER — Encounter: Payer: Self-pay | Admitting: Student

## 2022-10-20 VITALS — BP 138/78 | HR 93 | Ht 70.0 in | Wt 235.0 lb

## 2022-10-20 DIAGNOSIS — Z720 Tobacco use: Secondary | ICD-10-CM

## 2022-10-20 DIAGNOSIS — I952 Hypotension due to drugs: Secondary | ICD-10-CM

## 2022-10-20 DIAGNOSIS — I10 Essential (primary) hypertension: Secondary | ICD-10-CM | POA: Diagnosis not present

## 2022-10-20 DIAGNOSIS — I25119 Atherosclerotic heart disease of native coronary artery with unspecified angina pectoris: Secondary | ICD-10-CM | POA: Diagnosis not present

## 2022-10-20 DIAGNOSIS — R0602 Shortness of breath: Secondary | ICD-10-CM

## 2022-10-20 DIAGNOSIS — R0609 Other forms of dyspnea: Secondary | ICD-10-CM | POA: Diagnosis not present

## 2022-10-20 DIAGNOSIS — F1910 Other psychoactive substance abuse, uncomplicated: Secondary | ICD-10-CM

## 2022-10-20 DIAGNOSIS — R69 Illness, unspecified: Secondary | ICD-10-CM | POA: Diagnosis not present

## 2022-10-20 MED ORDER — RANOLAZINE ER 500 MG PO TB12: 500.0000 mg | ORAL_TABLET | Freq: Two times a day (BID) | ORAL | 2 refills | Status: DC

## 2022-10-20 NOTE — Patient Instructions (Signed)
Medication Instructions:  Start taking Ranexa 500 MG two times daily  *If you need a refill on your cardiac medications before your next appointment, please call your pharmacy*   Lab Work: None ordered    Testing/Procedures: Your physician has requested that you have an echocardiogram. Echocardiography is a painless test that uses sound waves to create images of your heart. It provides your doctor with information about the size and shape of your heart and how well your heart's chambers and valves are working. This procedure takes approximately one hour. There are no restrictions for this procedure. Please do NOT wear cologne, perfume, aftershave, or lotions (deodorant is allowed). Please arrive 15 minutes prior to your appointment time.   Your physician has requested that you have a lexiscan myoview. For further information please visit [email or web address]. Please follow instruction sheet, as given.    Follow-Up: At [facility], you and your health needs are our priority.  As part of our continuing mission to provide you with exceptional heart care, we have created designated Provider Care Teams.  These Care Teams include your primary Cardiologist (physician) and Advanced Practice Providers (APPs -  Physician Assistants and Nurse Practitioners) who all work together to provide you with the care you need, when you need it.  We recommend signing up for the patient portal called "MyChart".  Sign up information is provided on this After Visit Summary.  MyChart is used to connect with patients for Virtual Visits (Telemedicine).  Patients are able to view lab/test results, encounter notes, upcoming appointments, etc.  Non-urgent messages can be sent to your provider as well.   To learn more about what you can do with MyChart, go to [email or web address].    Your next appointment:   3-4 week(s)  Provider:   Mayra Reel, NP      Other Instructions How to Prepare for Your  Myoview Test (stress test):  Your remaining medications may be taken with water. Nothing to eat or drink, except water, 4 hours prior to arrival time.  NO caffeine/decaffeinated products, or chocolate 12 hours prior to arrival. [place], please do not wear dresses.  Skirts or pants are approprate, please wear a short sleeve shirt. NO perfume, cologne or lotion Wear comfortable walking shoes.  NO HEELS! Total time is 3 to 4 hours; you may want to bring reading material for the waiting time. Please report to [facility] for your test  What to expect after you arrive:  Once you arrive and check in for your appointment an IV will be started in your arm.  Then the Technoligist will inject a small amount of radioactive tracer.  There will be a 1 hour waiting period after this injection.  A series of pictures will be taken of your heart following this waiting period.  You will be prepped for the stress portion of the test.  During the stress portion of your test you will either walk on a treadmill or receive a small, safe amount of radioactive tracer injected in your IV.  After the stress portion, there is a short rest period during which time your heart and blood pressure will be monitored.  After the short rest period the Technologist will begin your second set of pictures.  Your doctor will inform you of your test results within 7-10 business days.  In preparation for your appointment, medication and supplies will be purchased.  Appointment availability is limited, so if you need to cancel or reschedule please call the office at  [phone] 24 hours in advance to avoid a cancellation fee of $100.00  IF YOU THINK YOU MAY BE PREGNANT, PLEASE INFORM THE TECHNOLOGIST.

## 2022-10-21 ENCOUNTER — Telehealth: Payer: Self-pay | Admitting: Cardiology

## 2022-10-21 NOTE — Telephone Encounter (Signed)
Pt c/o medication issue:  1. Name of Medication:  ranolazine (RANEXA) 500 MG 12 hr tablet  2. How are you currently taking this medication (dosage and times per day)?   3. Are you having a reaction (difficulty breathing--STAT)?   4. What is your medication issue?  Patient states this medication will cost him $167, but he is on a fixed income and unable to afford it.

## 2022-10-21 NOTE — Telephone Encounter (Signed)
Pt was seen 10/20/2022 by Carlos Levering, NP for chest pain.  Pt was to be started on Ranexa but can not afford it.  Is there anything else that he should do in place of it?

## 2022-10-25 MED ORDER — ISOSORBIDE MONONITRATE ER 120 MG PO TB24: 120.0000 mg | ORAL_TABLET | Freq: Every day | ORAL | 3 refills | Status: DC

## 2022-10-25 NOTE — Telephone Encounter (Signed)
Pt aware of increase to 120 mg daily.  RX sent into [facility] [address] as requested.  He will call back if any further needs or concerns.

## 2022-10-25 NOTE — Telephone Encounter (Signed)
Justin Levering, NP  You3 days ago   Please let the patient know he can increase Imdur to 2 tablets once a day for a total of 120mg  daily.  Thank you!  DW    Attempted to contact pt regarding the above order.  No answer and mail box has not been set up yet.  Will continue to attempt to contact pt.

## 2022-11-04 ENCOUNTER — Telehealth: Payer: Self-pay | Admitting: Nurse Practitioner

## 2022-11-04 NOTE — Telephone Encounter (Signed)
Type of forms received: Parking Placard  Routed to:[employer]  Paperwork received by : Yuval Nolet S   Individual made aware of 3-5 business day turn around (Y/N): Y  Form completed and patient made aware of charges(Y/N): Y   Faxed to :   Form location: Place in PCP folder  

## 2022-11-07 NOTE — Telephone Encounter (Signed)
Form received and placed into [name]'s box in his office.

## 2022-11-09 ENCOUNTER — Ambulatory Visit: Payer: Medicare HMO | Admitting: Cardiology

## 2022-11-09 NOTE — Telephone Encounter (Signed)
Inform the patient that it will be done by the end of the week

## 2022-11-09 NOTE — Telephone Encounter (Signed)
Patient called in to check on the status of the completion of his placard.

## 2022-11-10 ENCOUNTER — Telehealth: Payer: Self-pay | Admitting: Nurse Practitioner

## 2022-11-10 NOTE — Telephone Encounter (Signed)
Patients form is completed and place in the [facility] outgoing box in my office

## 2022-11-10 NOTE — Telephone Encounter (Signed)
Called and informed pt that paperwork would be ready by the end of the week.

## 2022-11-10 NOTE — Telephone Encounter (Signed)
Pt called checking status of ppw? Pt states he is leaving out of town on Sat, 4/27, & would like to have his handicap parking placard by then. Call back # [phone]

## 2022-11-11 NOTE — Telephone Encounter (Signed)
Called and informed pt that form is up front and ready for pick up. Copy made and placed in scan.

## 2022-11-15 ENCOUNTER — Other Ambulatory Visit: Payer: Self-pay | Admitting: Cardiology

## 2022-11-16 ENCOUNTER — Telehealth (HOSPITAL_COMMUNITY): Payer: Self-pay | Admitting: *Deleted

## 2022-11-16 NOTE — Telephone Encounter (Signed)
Not able to reach patient nor able to leave a voice message.

## 2022-11-18 ENCOUNTER — Ambulatory Visit (HOSPITAL_COMMUNITY): Payer: Medicare HMO

## 2022-11-21 ENCOUNTER — Encounter: Payer: Self-pay | Admitting: Student

## 2022-11-21 NOTE — Telephone Encounter (Signed)
Error

## 2022-11-21 NOTE — Progress Notes (Signed)
Cardiology Clinic Note   Date: 11/25/2022 ID: [name], DOB 01/15/1966, MRN [id number]  Primary Cardiologist:  Donato Schultz, MD  Patient Profile    AUL DONABEDIAN is a 57 y.o. male who presents to the clinic today for follow up.   Past medical history significant for: CAD. LHC 05/03/2021 (NSTEMI): Ramus 80%.  Mid CX 80%.  Proximal LAD 50%.  PCI with DES to mid LCx, DES ostial ramus. Echo 09/07/2021: EF 60 to 65%.  Mild LAE.  Trivial MR.  Moderate aortic valve calcification.  Mild AS.  Mild dilatation of ascending aorta 38 mm. LHC 09/15/2021 (angina): Proximal LAD 30%.  Patent stents ramus and LCx. Nuclear stress test 11/24/2022: Normal, low risk study.  Hypertension. Hyperlipidemia. Lipid panel 09/07/2021: LDL 73, HDL 37, TG 170, total 144. Polysubstance abuse. Tobacco abuse. CKD stage IIIa. Prediabetes. GERD. OSA. Syncope.   History of Present Illness    KYALL WESBY was first evaluated by Dr. Anne Fu on 05/10/2019 for syncope at the request of Dr. Dallas Schimke. Echo showed normal LV function with mild to moderate aortic valve sclerosis/calcification without stenosis, and mild dilatation of aortic root 38 mm. 14 day heart monitor was ordered but does not appear to have been completed.   Patient was last seen in the office by me on 10/20/2022 with various complaints including labile BP, constant shortness of breath worsened with exertion and a 1 to 108-month history of central chest pain radiating down left arm occurring with stress or exertion and resolves with 1-2 doses of NTG.  Episodes were occurring 2-3 times a week and similar to prior anginal pain.  He had last used cocaine 1.5 weeks prior.  Denied other substances including alcohol.  Patient was started on Ranexa 500 mg twice daily.  Nuclear stress test was a low risk study.   Today, patient reports dizziness has improved but he continues to have the same symptoms of DOE, central chest pain with radiation to jaw and down left arm.  Patient last used cocaine on 11/18/2022. He has not started Ranexa as he went on vacation after his last visit and it was not ready before he left. He continues to smoke 1 pack a day and is very motivated to quit. He did not have his echo yet. He reports a constant states of anxiety. He also suffers from insomnia and will go days without sleeping before he "crashes and then sleeps for 24 hours." He is again quite agitated and fidgety and reports he is always like this.  He is breathing heavy throughout the start of the the office visit but as the visit progressed his breathing calm down and was normal.  He denies lower extremity edema, orthopnea or PND. Discussed results of stress test.      ROS: All other systems reviewed and are otherwise negative except as noted in History of Present Illness.  Studies Reviewed    ECG is not ordered today.       Physical Exam    VS:  BP 138/76   Pulse 98   Ht 5\' 10"  (1.778 m)   Wt 238 lb (108 kg)   SpO2 96%   BMI 34.15 kg/m  , BMI Body mass index is 34.15 kg/m.  GEN: Well nourished, well developed, in no acute distress. Neck: No JVD or carotid bruits. Cardiac:  RRR. No murmurs. No rubs or gallops.   Respiratory:  Respirations regular and unlabored. Diminished throughout without rales, wheezing or rhonchi. GI: Soft, nontender,  nondistended. Extremities: Radials/DP/PT 2+ and equal bilaterally. No clubbing or cyanosis. No edema.  Skin: Warm and dry, no rash. Neuro: Strength intact.  Assessment & Plan    CAD/angina/DOE.  S/p PCI with DES to mid LCx and DES to ostial ramus October 2022.  LHC March 2023 showed patent stents.  Nuclear stress test was a low risk, normal study.  Patient reports continued episodes of chest pain radiating to left jaw and down left arm. Last cocaine use was 11/18/2022. Patient did not start Ranexa. Discussed the possibility that symptoms could be driven by anxiety. He is fidgety and agitated through much of his visit. He takes  buspirone but does not feel it is helping.  He states he is always like this.  He is scheduled for echo at the end of the month and is encouraged to keep this visit.  Will resend Ranexa to see if this helps his symptoms.  Continue aspirin, Plavix, isosorbide, rosuvastatin, as needed SL NTG.  Patient not on beta-blocker secondary to history of cocaine abuse.   Hypertension.  BP today 138/76 at intake and 136/80 on my recheck.  Patient denies headaches or dizziness.  Continue amlodipine, irbesartan. Shortness of breath.  Patient reports continued shortness of breath.  He has 2 inhalers that he does not feel are working.  He is a 1 pack-a-day smoker and has never been seen by pulmonology.  Breath sounds are diminished throughout on exam today.  No wheezing, rales, or rhonchi.  Will refer to pulmonology. Cocaine abuse.  Patient reports last cocaine use 11/18/2022.  Once again discussed the importance of cessation.  He reports he will continue to work on this. Tobacco abuse.  Patient smokes 1 pack/day.  He is very motivated to quit.  Will start patient on Chantix.  He is instructed to let PCP know this has been started. Hyperlipidemia.  LDL February 2023 73, not at goal.  Will get fasting lipid panel and LFT when patient gets echo.  Continue rosuvastatin.  Disposition: Echo scheduled for 12/14/2022.  Lipid panel and LFTs.  Pulmonology referral.  Recent Ranexa for patient to start 500 mg twice daily.  Chantix starter pack.  Return in 3 months or sooner as needed.         Signed, Etta Grandchild. Carmelia Tiner, DNP, NP-C

## 2022-11-24 ENCOUNTER — Other Ambulatory Visit (HOSPITAL_COMMUNITY): Payer: Self-pay | Admitting: Cardiology

## 2022-11-24 ENCOUNTER — Ambulatory Visit: Payer: Medicare HMO | Attending: Cardiology

## 2022-11-24 VITALS — Ht 70.0 in | Wt 235.0 lb

## 2022-11-24 DIAGNOSIS — I25119 Atherosclerotic heart disease of native coronary artery with unspecified angina pectoris: Secondary | ICD-10-CM | POA: Diagnosis not present

## 2022-11-24 DIAGNOSIS — R0600 Dyspnea, unspecified: Secondary | ICD-10-CM

## 2022-11-24 DIAGNOSIS — R079 Chest pain, unspecified: Secondary | ICD-10-CM

## 2022-11-24 DIAGNOSIS — E785 Hyperlipidemia, unspecified: Secondary | ICD-10-CM | POA: Diagnosis not present

## 2022-11-24 DIAGNOSIS — I10 Essential (primary) hypertension: Secondary | ICD-10-CM | POA: Diagnosis not present

## 2022-11-24 DIAGNOSIS — R0602 Shortness of breath: Secondary | ICD-10-CM | POA: Diagnosis not present

## 2022-11-24 DIAGNOSIS — F1410 Cocaine abuse, uncomplicated: Secondary | ICD-10-CM | POA: Diagnosis not present

## 2022-11-24 DIAGNOSIS — I25118 Atherosclerotic heart disease of native coronary artery with other forms of angina pectoris: Secondary | ICD-10-CM | POA: Diagnosis not present

## 2022-11-24 DIAGNOSIS — R0609 Other forms of dyspnea: Secondary | ICD-10-CM | POA: Diagnosis not present

## 2022-11-24 DIAGNOSIS — Z720 Tobacco use: Secondary | ICD-10-CM | POA: Diagnosis not present

## 2022-11-24 LAB — MYOCARDIAL PERFUSION IMAGING: Rest Nuclear Isotope Dose: 10.9 mCi

## 2022-11-24 MED ORDER — TECHNETIUM TC 99M TETROFOSMIN IV KIT
31.0000 | PACK | Freq: Once | INTRAVENOUS | Status: AC | PRN
  Administered 2022-11-24: 31 via INTRAVENOUS

## 2022-11-24 MED ORDER — REGADENOSON 0.4 MG/5ML IV SOLN
0.4000 mg | Freq: Once | INTRAVENOUS | Status: AC
Start: 2022-11-24 — End: 2022-11-24
  Administered 2022-11-24: 0.4 mg via INTRAVENOUS

## 2022-11-24 MED ORDER — TECHNETIUM TC 99M TETROFOSMIN IV KIT
10.9000 | PACK | Freq: Once | INTRAVENOUS | Status: AC | PRN
  Administered 2022-11-24: 10.9 via INTRAVENOUS

## 2022-11-25 ENCOUNTER — Ambulatory Visit (INDEPENDENT_AMBULATORY_CARE_PROVIDER_SITE_OTHER): Payer: Medicare HMO | Admitting: Student

## 2022-11-25 ENCOUNTER — Encounter: Payer: Self-pay | Admitting: Student

## 2022-11-25 VITALS — BP 138/76 | HR 98 | Ht 70.0 in | Wt 238.0 lb

## 2022-11-25 DIAGNOSIS — E785 Hyperlipidemia, unspecified: Secondary | ICD-10-CM | POA: Diagnosis not present

## 2022-11-25 DIAGNOSIS — F1410 Cocaine abuse, uncomplicated: Secondary | ICD-10-CM

## 2022-11-25 DIAGNOSIS — I10 Essential (primary) hypertension: Secondary | ICD-10-CM

## 2022-11-25 DIAGNOSIS — I25118 Atherosclerotic heart disease of native coronary artery with other forms of angina pectoris: Secondary | ICD-10-CM | POA: Diagnosis not present

## 2022-11-25 DIAGNOSIS — R0602 Shortness of breath: Secondary | ICD-10-CM

## 2022-11-25 DIAGNOSIS — Z720 Tobacco use: Secondary | ICD-10-CM | POA: Insufficient documentation

## 2022-11-25 DIAGNOSIS — I25119 Atherosclerotic heart disease of native coronary artery with unspecified angina pectoris: Secondary | ICD-10-CM | POA: Insufficient documentation

## 2022-11-25 DIAGNOSIS — R0600 Dyspnea, unspecified: Secondary | ICD-10-CM | POA: Insufficient documentation

## 2022-11-25 DIAGNOSIS — R079 Chest pain, unspecified: Secondary | ICD-10-CM | POA: Insufficient documentation

## 2022-11-25 DIAGNOSIS — R0609 Other forms of dyspnea: Secondary | ICD-10-CM

## 2022-11-25 LAB — MYOCARDIAL PERFUSION IMAGING
Base ST Depression (mm): 0 mm
Nuc Stress EF: 62 %
Peak HR: 98 {beats}/min
Rest HR: 83 {beats}/min
ST Depression (mm): 0 mm
Stress Nuclear Isotope Dose: 31 mCi

## 2022-11-25 MED ORDER — IRBESARTAN 150 MG PO TABS: 150.0000 mg | ORAL_TABLET | Freq: Every day | ORAL | 3 refills | Status: DC

## 2022-11-25 MED ORDER — RANOLAZINE ER 500 MG PO TB12: 500.0000 mg | ORAL_TABLET | Freq: Two times a day (BID) | ORAL | 11 refills | Status: DC

## 2022-11-25 MED ORDER — VARENICLINE TARTRATE (STARTER) 0.5 MG X 11 & 1 MG X 42 PO TBPK: 0.5000 mg | ORAL_TABLET | Freq: Every day | ORAL | 0 refills | Status: DC

## 2022-11-25 NOTE — Patient Instructions (Signed)
Medication Instructions:  Your physician recommends that you continue on your current medications as directed. Please refer to the Current Medication list given to you today.  *If you need a refill on your cardiac medications before your next appointment, please call your pharmacy*   Lab Work: Your physician recommends that you return the ending of May to have the following Labs drawn: Lipids and Lfts.  If you have labs (blood work) drawn today and your tests are completely normal, you will receive your results only by: MyChart Message (if you have MyChart) OR A paper copy in the mail If you have any lab test that is abnormal or we need to change your treatment, we will call you to review the results.   Testing/Procedures: NONE   Follow-Up: At [facility], you and your health needs are our priority.  As part of our continuing mission to provide you with exceptional heart care, we have created designated Provider Care Teams.  These Care Teams include your primary Cardiologist (physician) and Advanced Practice Providers (APPs -  Physician Assistants and Nurse Practitioners) who all work together to provide you with the care you need, when you need it.  We recommend signing up for the patient portal called "MyChart".  Sign up information is provided on this After Visit Summary.  MyChart is used to connect with patients for Virtual Visits (Telemedicine).  Patients are able to view lab/test results, encounter notes, upcoming appointments, etc.  Non-urgent messages can be sent to your provider as well.   To learn more about what you can do with MyChart, go to [email or web address].    Your next appointment:   3 month(s)  Provider:   Donato Schultz, MD Or any available APP at the [facility].

## 2022-11-28 ENCOUNTER — Other Ambulatory Visit: Payer: Self-pay | Admitting: Nurse Practitioner

## 2022-11-28 ENCOUNTER — Other Ambulatory Visit: Payer: Self-pay | Admitting: Physician Assistant

## 2022-11-28 DIAGNOSIS — F419 Anxiety disorder, unspecified: Secondary | ICD-10-CM

## 2022-12-05 ENCOUNTER — Ambulatory Visit (INDEPENDENT_AMBULATORY_CARE_PROVIDER_SITE_OTHER): Payer: Medicare HMO | Admitting: Nurse Practitioner

## 2022-12-05 ENCOUNTER — Encounter: Payer: Self-pay | Admitting: Nurse Practitioner

## 2022-12-05 VITALS — BP 122/68 | HR 98 | Temp 97.7°F | Resp 16 | Ht 70.0 in | Wt 236.2 lb

## 2022-12-05 DIAGNOSIS — Z720 Tobacco use: Secondary | ICD-10-CM

## 2022-12-05 DIAGNOSIS — I10 Essential (primary) hypertension: Secondary | ICD-10-CM

## 2022-12-05 DIAGNOSIS — Z1211 Encounter for screening for malignant neoplasm of colon: Secondary | ICD-10-CM

## 2022-12-05 DIAGNOSIS — I25119 Atherosclerotic heart disease of native coronary artery with unspecified angina pectoris: Secondary | ICD-10-CM

## 2022-12-05 DIAGNOSIS — F32A Depression, unspecified: Secondary | ICD-10-CM

## 2022-12-05 DIAGNOSIS — Z0000 Encounter for general adult medical examination without abnormal findings: Secondary | ICD-10-CM

## 2022-12-05 DIAGNOSIS — F419 Anxiety disorder, unspecified: Secondary | ICD-10-CM | POA: Diagnosis not present

## 2022-12-05 DIAGNOSIS — R7303 Prediabetes: Secondary | ICD-10-CM | POA: Diagnosis not present

## 2022-12-05 DIAGNOSIS — F322 Major depressive disorder, single episode, severe without psychotic features: Secondary | ICD-10-CM | POA: Diagnosis not present

## 2022-12-05 DIAGNOSIS — E669 Obesity, unspecified: Secondary | ICD-10-CM | POA: Diagnosis not present

## 2022-12-05 DIAGNOSIS — Z125 Encounter for screening for malignant neoplasm of prostate: Secondary | ICD-10-CM

## 2022-12-05 DIAGNOSIS — G4733 Obstructive sleep apnea (adult) (pediatric): Secondary | ICD-10-CM

## 2022-12-05 DIAGNOSIS — Z122 Encounter for screening for malignant neoplasm of respiratory organs: Secondary | ICD-10-CM

## 2022-12-05 LAB — POCT URINALYSIS DIPSTICK
Bilirubin, UA: NEGATIVE
Blood, UA: NEGATIVE
Glucose, UA: NEGATIVE
Ketones, UA: POSITIVE
Leukocytes, UA: NEGATIVE
Nitrite, UA: NEGATIVE
Protein, UA: POSITIVE — AB
Spec Grav, UA: >=1.03 — AB (ref 1.010–1.025)
Urobilinogen, UA: NEGATIVE E.U./dL — AB
pH, UA: 5 (ref 5.0–8.0)

## 2022-12-05 MED ORDER — BUSPIRONE HCL 15 MG PO TABS
15.0000 mg | ORAL_TABLET | Freq: Two times a day (BID) | ORAL | 2 refills | Status: DC
Start: 2022-12-05 — End: 2024-02-23

## 2022-12-05 NOTE — Patient Instructions (Signed)
Nice to see you today I will be in touch with the labs once I have them Follow up with me in 1 year for your next physical and labs Get the shingles vaccine at the pharmacy If you need me before the next year make an appointment

## 2022-12-05 NOTE — Assessment & Plan Note (Signed)
Pending A1c 

## 2022-12-05 NOTE — Assessment & Plan Note (Signed)
Discussed age-appropriate immunizations and screening exams.  Did review patient's personal, surgical, social, family history.  Patient to get shingles vaccine at local pharmacy.  Patient is overdue for CRC screening and is referral placed.  PSA drawn today for prostate cancer screening.  Patient was given information at discharge about preventative healthcare maintenance with anticipatory guidance.

## 2022-12-05 NOTE — Assessment & Plan Note (Signed)
Does have a CPAP does not use it because of the seal breaks he has been referred to pulmonology by cardiology pending appointment

## 2022-12-05 NOTE — Assessment & Plan Note (Signed)
Was seen by cardiology and placed on Chantix.  UA negative for blood

## 2022-12-05 NOTE — Progress Notes (Signed)
Established Patient Office Visit  Subjective   Patient ID: [name], male    DOB: Jul 05, 1966  Age: 57 y.o. MRN: [id number]  Chief Complaint  Patient presents with   Annual Exam   Depression      HTN: amlodipine, irbesartan, isosorbide. Does check his blood pressure at home. 3 times a week   OSA: states that he does have one and does not use it. States that it the seal break. States that it feels him up with air   MDD: patient currently on fluoxetine 40mg . States that he is fidgetty and cant sit still. States that he cannot sleep well and sometimes he feels like he sleeps too much    COPD: trelegy. States that when he first uses it he can tell a difference. States that he will use the albuterol 2-4 times a day. States that he cannot walk to the bathroom with out getting short of breath. He has been seen by cardiology and they have referred him to pulmonology    for complete physical and follow up of chronic conditions.  Immunizations: -Tetanus: Completed in 2023 -Influenza: Completed this season -Shingles: Needs to get at local pharmacy -Pneumonia: Has gotten pneumococcal 23  Diet: Fair diet. States that he is eating all the time. States that he eats 3 good meals a day and does snack. States that he does soda and water 6-7  sodas a day which is likely [facility] Exercise: No regular exercise.  Eye exam: needs updating . Has glasses  Dental exam: Completes semi-annually    Colonoscopy: Completed in 2019. overdue Lung Cancer Screening:  needs referral   PSA: Due      Review of Systems  Constitutional:  Negative for chills and fever.  Respiratory:  Positive for shortness of breath.   Cardiovascular:  Positive for chest pain. Negative for leg swelling.  Gastrointestinal:  Negative for abdominal pain, blood in stool, constipation, diarrhea, nausea and vomiting.       BM daily   Genitourinary:  Negative for dysuria and hematuria.  Neurological:  Negative for  tingling and headaches.  Psychiatric/Behavioral:  Positive for depression. Negative for hallucinations and suicidal ideas. The patient is nervous/anxious.       Objective:     BP 122/68   Pulse 98   Temp 97.7 F (36.5 C)   Resp 16   Ht 5\' 10"  (1.778 m)   Wt 236 lb 4 oz (107.2 kg)   SpO2 98%   BMI 33.90 kg/m  BP Readings from Last 3 Encounters:  12/05/22 122/68  11/25/22 138/76  10/20/22 138/78   Wt Readings from Last 3 Encounters:  12/05/22 236 lb 4 oz (107.2 kg)  11/25/22 238 lb (108 kg)  11/24/22 235 lb (106.6 kg)      Physical Exam Vitals and nursing note reviewed.  Constitutional:      Appearance: Normal appearance.  HENT:     Right Ear: Tympanic membrane, ear canal and external ear normal.     Left Ear: Tympanic membrane, ear canal and external ear normal.     Mouth/Throat:     Mouth: Mucous membranes are moist.     Pharynx: Oropharynx is clear.  Eyes:     Extraocular Movements: Extraocular movements intact.     Pupils: Pupils are equal, round, and reactive to light.  Cardiovascular:     Rate and Rhythm: Normal rate and regular rhythm.     Pulses: Normal pulses.     Heart sounds:  Normal heart sounds.  Pulmonary:     Effort: Pulmonary effort is normal.     Breath sounds: Normal breath sounds.  Abdominal:     General: Bowel sounds are normal. There is no distension.     Palpations: There is no mass.     Tenderness: There is no abdominal tenderness.     Hernia: No hernia is present.  Musculoskeletal:     Right lower leg: No edema.     Left lower leg: Edema present.  Lymphadenopathy:     Cervical: No cervical adenopathy.  Skin:    General: Skin is warm.  Neurological:     General: No focal deficit present.     Mental Status: He is alert.     Deep Tendon Reflexes:     Reflex Scores:      Bicep reflexes are 2+ on the right side and 2+ on the left side.      Patellar reflexes are 2+ on the right side and 2+ on the left side.    Comments: Bilateral  upper and lower extremity strength 5/5  Psychiatric:        Mood and Affect: Mood normal.        Behavior: Behavior normal.        Thought Content: Thought content normal.        Judgment: Judgment normal.      Results for orders placed or performed in visit on 12/05/22  POCT urinalysis dipstick  Result Value Ref Range   Color, UA Straw    Clarity, UA cloudy    Glucose, UA Negative Negative   Bilirubin, UA Negative    Ketones, UA Positive    Spec Grav, UA >=1.030 (A) 1.010 - 1.025   Blood, UA Negative    pH, UA 5.0 5.0 - 8.0   Protein, UA Positive (A) Negative   Urobilinogen, UA negative (A) 0.2 or 1.0 E.U./dL   Nitrite, UA Negative    Leukocytes, UA Negative Negative   Appearance     Odor        The ASCVD Risk score (Arnett DK, et al., 2019) failed to calculate for the following reasons:   The patient has a prior MI or stroke diagnosis    Assessment & Plan:   Problem List Items Addressed This Visit       Cardiovascular and Mediastinum   HTN (hypertension), benign (Chronic)    Patient currently followed by cardiology amlodipine 10 mg, irbesartan 150 mg, isosorbide mononitrate.  He is followed by cardiology blood pressure under control does have ability to check blood pressure at home continue      Relevant Orders   TSH   Coronary artery disease involving native coronary artery of native heart with angina pectoris (HCC)   Relevant Orders   Lipid panel     Respiratory   Obstructive sleep apnea (Chronic)    Does have a CPAP does not use it because of the seal breaks he has been referred to pulmonology by cardiology pending appointment        Other   Tobacco abuse    Was seen by cardiology and placed on Chantix.  UA negative for blood      Relevant Orders   POCT urinalysis dipstick (Completed)   Prediabetes    Pending A1c      Relevant Orders   Hemoglobin A1c   Depression, major, single episode, severe (HCC)    Patient currently on fluoxetine 40 mg.   Has recently  lost his mother.  He is doing some anxiety.  Will increase buspirone from 10 mg 3 times daily to 15 mg 3 times daily.  Continue fluoxetine 40 mg.  Patient denies HI/SI/AVH.      Relevant Medications   busPIRone (BUSPAR) 15 MG tablet   Other Relevant Orders   TSH   Preventative health care - Primary    Discussed age-appropriate immunizations and screening exams.  Did review patient's personal, surgical, social, family history.  Patient to get shingles vaccine at local pharmacy.  Patient is overdue for CRC screening and is referral placed.  PSA drawn today for prostate cancer screening.  Patient was given information at discharge about preventative healthcare maintenance with anticipatory guidance.      Relevant Orders   CBC   Comprehensive metabolic panel   TSH   Other Visit Diagnoses     Screening for prostate cancer       Relevant Orders   PSA, Medicare   Obesity (BMI 30-39.9)       Relevant Orders   Hemoglobin A1c   Screening for lung cancer       Relevant Orders   Ambulatory Referral Lung Cancer Screening [facility]   Anxiety and depression       Relevant Medications   busPIRone (BUSPAR) 15 MG tablet   Screening for colon cancer       Relevant Orders   Ambulatory referral to Gastroenterology       Return in about 1 year (around 12/05/2023), or if symptoms worsen or fail to improve, for CPE and Labs.    Audria Nine, NP

## 2022-12-05 NOTE — Assessment & Plan Note (Signed)
Patient currently on fluoxetine 40 mg.  Has recently lost his mother.  He is doing some anxiety.  Will increase buspirone from 10 mg 3 times daily to 15 mg 3 times daily.  Continue fluoxetine 40 mg.  Patient denies HI/SI/AVH.

## 2022-12-05 NOTE — Assessment & Plan Note (Signed)
Patient currently followed by cardiology amlodipine 10 mg, irbesartan 150 mg, isosorbide mononitrate.  He is followed by cardiology blood pressure under control does have ability to check blood pressure at home continue

## 2022-12-06 LAB — CBC
HCT: 47.7 % (ref 39.0–52.0)
Hemoglobin: 15.9 g/dL (ref 13.0–17.0)
MCHC: 33.3 g/dL (ref 30.0–36.0)
MCV: 89.6 fl (ref 78.0–100.0)
Platelets: 200 10*3/uL (ref 150.0–400.0)
RBC: 5.32 Mil/uL (ref 4.22–5.81)
RDW: 14.3 % (ref 11.5–15.5)
WBC: 7.4 10*3/uL (ref 4.0–10.5)

## 2022-12-06 LAB — COMPREHENSIVE METABOLIC PANEL
ALT: 20 U/L (ref 0–53)
AST: 17 U/L (ref 0–37)
Albumin: 4.4 g/dL (ref 3.5–5.2)
Alkaline Phosphatase: 93 U/L (ref 39–117)
BUN: 18 mg/dL (ref 6–23)
CO2: 30 mEq/L (ref 19–32)
Calcium: 9.3 mg/dL (ref 8.4–10.5)
Chloride: 103 mEq/L (ref 96–112)
Creatinine, Ser: 1.25 mg/dL (ref 0.40–1.50)
GFR: 64.13 mL/min (ref >60.00)
Glucose, Bld: 119 mg/dL — ABNORMAL HIGH (ref 70–99)
Potassium: 4.1 mEq/L (ref 3.5–5.1)
Sodium: 140 mEq/L (ref 135–145)
Total Bilirubin: 0.4 mg/dL (ref 0.2–1.2)
Total Protein: 6.8 g/dL (ref 6.0–8.3)

## 2022-12-06 LAB — PSA, MEDICARE: PSA: 0.59 ng/ml (ref 0.10–4.00)

## 2022-12-06 LAB — LIPID PANEL
Cholesterol: 153 mg/dL (ref 0–200)
HDL: 43.1 mg/dL (ref >39.00)
LDL Cholesterol: 91 mg/dL (ref 0–99)
NonHDL: 110.39
Total CHOL/HDL Ratio: 4
Triglycerides: 96 mg/dL (ref 0.0–149.0)
VLDL: 19.2 mg/dL (ref 0.0–40.0)

## 2022-12-06 LAB — HEMOGLOBIN A1C: Hgb A1c MFr Bld: 6.3 % (ref 4.6–6.5)

## 2022-12-06 LAB — TSH: TSH: 1.45 u[IU]/mL (ref 0.35–5.50)

## 2022-12-14 ENCOUNTER — Ambulatory Visit (HOSPITAL_COMMUNITY): Payer: Medicare HMO | Attending: Cardiology

## 2022-12-14 DIAGNOSIS — R0609 Other forms of dyspnea: Secondary | ICD-10-CM | POA: Diagnosis not present

## 2022-12-14 DIAGNOSIS — I25119 Atherosclerotic heart disease of native coronary artery with unspecified angina pectoris: Secondary | ICD-10-CM | POA: Diagnosis not present

## 2022-12-14 DIAGNOSIS — I77810 Thoracic aortic ectasia: Secondary | ICD-10-CM

## 2022-12-14 DIAGNOSIS — I358 Other nonrheumatic aortic valve disorders: Secondary | ICD-10-CM | POA: Diagnosis not present

## 2022-12-14 LAB — ECHOCARDIOGRAM COMPLETE
Area-P 1/2: 4.1 cm2
S' Lateral: 3.3 cm

## 2023-02-06 ENCOUNTER — Telehealth: Payer: Self-pay | Admitting: Nurse Practitioner

## 2023-02-06 NOTE — Telephone Encounter (Signed)
FYI: This call has been transferred to Access Nurse. Once the result note has been entered staff can address the message at that time.  Patient called in with the following symptoms:  Red Word: coughing up blood, chest pains   Please advise at Mobile [phone] (mobile)  Message is routed to Provider Pool and [facility] Triage     Pt called stating he has been coughing up blood & having chest pains. Pt states he believes the chest pains is from him coughing a lot. Pt stated he reached out to his pulmonologist, but was instructed to contact his pcp. Scheduled pt with [facility] for tomorrow, 7/23 @ 12pm. Transferred pt to access nurse. Call back # [phone]

## 2023-02-06 NOTE — Telephone Encounter (Signed)
Unable mto reach pt at any contact #. No v/m set up. DPR does not give permission to speak with anyone and no messages to be left. Per access note pt was advised to call 911 and pt disagreed;pt already has appt with Audria Nine NP on 02/07/23 at 12 noon. I will try pt again in AM. Sending note to Audria Nine NP, [facility] pool and [facility]sc triage.

## 2023-02-07 ENCOUNTER — Ambulatory Visit: Payer: Medicare HMO | Admitting: Nurse Practitioner

## 2023-02-07 ENCOUNTER — Ambulatory Visit
Admission: RE | Admit: 2023-02-07 | Discharge: 2023-02-07 | Disposition: A | Discharge status: Home / Self Care | Payer: Medicare HMO | Source: Ambulatory Visit | Attending: Nurse Practitioner | Admitting: Nurse Practitioner

## 2023-02-07 ENCOUNTER — Encounter: Payer: Self-pay | Admitting: Nurse Practitioner

## 2023-02-07 VITALS — BP 144/84 | HR 97 | Temp 97.2°F | Ht 70.0 in | Wt 248.0 lb

## 2023-02-07 DIAGNOSIS — J439 Emphysema, unspecified: Secondary | ICD-10-CM | POA: Diagnosis not present

## 2023-02-07 DIAGNOSIS — R042 Hemoptysis: Secondary | ICD-10-CM

## 2023-02-07 DIAGNOSIS — R0602 Shortness of breath: Secondary | ICD-10-CM

## 2023-02-07 DIAGNOSIS — R600 Localized edema: Secondary | ICD-10-CM | POA: Diagnosis not present

## 2023-02-07 DIAGNOSIS — R051 Acute cough: Secondary | ICD-10-CM

## 2023-02-07 DIAGNOSIS — I700 Atherosclerosis of aorta: Secondary | ICD-10-CM | POA: Diagnosis not present

## 2023-02-07 LAB — CBC
HCT: 49 % (ref 39.0–52.0)
Hemoglobin: 16 g/dL (ref 13.0–17.0)
MCHC: 32.7 g/dL (ref 30.0–36.0)
MCV: 90.3 fl (ref 78.0–100.0)
Platelets: 193 10*3/uL (ref 150.0–400.0)
RBC: 5.42 Mil/uL (ref 4.22–5.81)
RDW: 14.2 % (ref 11.5–15.5)
WBC: 8.7 10*3/uL (ref 4.0–10.5)

## 2023-02-07 LAB — BASIC METABOLIC PANEL
BUN: 24 mg/dL — ABNORMAL HIGH (ref 6–23)
CO2: 26 mEq/L (ref 19–32)
Calcium: 9.4 mg/dL (ref 8.4–10.5)
Chloride: 105 mEq/L (ref 96–112)
Creatinine, Ser: 1.3 mg/dL (ref 0.40–1.50)
GFR: 61.11 mL/min (ref >60.00)
Glucose, Bld: 121 mg/dL — ABNORMAL HIGH (ref 70–99)
Potassium: 3.9 mEq/L (ref 3.5–5.1)
Sodium: 139 mEq/L (ref 135–145)

## 2023-02-07 LAB — BRAIN NATRIURETIC PEPTIDE: Pro B Natriuretic peptide (BNP): 16 pg/mL (ref 0.0–100.0)

## 2023-02-07 LAB — POC COVID19 BINAXNOW: SARS Coronavirus 2 Ag: NEGATIVE

## 2023-02-07 NOTE — Progress Notes (Signed)
Acute Office Visit  Subjective:     Patient ID: [name], male    DOB: [birth year], 57 y.o.   MRN: [id number]  Chief Complaint  Patient presents with   Cough    Coughing up blood 2 days ago. Dark red stringy mucus. Now it is black stringy stuff. Was having chest pain over the weekend thinks it was due to cough took nitroglycerin.      Patient is in today for hemoptysis with a history of NSTEMI, CAD, HTN, OSA, tobacco abuse, alcohol abuse  Last Ct of the chest ws 09/08/2014, showed centrilobular nodules in a pattern most consistent with smoking related respiratory bronchilitis. Patient states that he did reach out ot pulmonary and they deferred to primary care services    States that Sunday night he coughed up blood twelve. States that he was not feeling well Friday and Saturday. Couldn't breath Sunday and started coughing. States since he is coughing up black. States that he does have phlegm that is normally clear. States that he is more short of breath than normal. No sick contacts. States that he has been using flonase. States that it has been within the past 2-3 days once.  Review of Systems  Constitutional:  Negative for chills and fever.  HENT:  Negative for ear discharge, ear pain and sore throat.   Respiratory:  Positive for cough, hemoptysis, sputum production, shortness of breath and wheezing.   Cardiovascular:  Negative for chest pain.  Neurological:  Negative for headaches.        Objective:    BP (!) 144/84   Pulse 97   Temp (!) 97.2 F (36.2 C) (Temporal)   Ht 5\' 10"  (1.778 m)   Wt 248 lb (112.5 kg)   SpO2 96%   BMI 35.58 kg/m  BP Readings from Last 3 Encounters:  02/07/23 (!) 144/84  12/05/22 122/68  11/25/22 138/76   Wt Readings from Last 3 Encounters:  02/07/23 248 lb (112.5 kg)  12/05/22 236 lb 4 oz (107.2 kg)  11/25/22 238 lb (108 kg)      Physical Exam Vitals and nursing note reviewed.  Constitutional:      Appearance: Normal appearance.   HENT:     Right Ear: Ear canal and external ear normal.     Left Ear: Ear canal and external ear normal.     Ears:     Comments: Slight erythema to superior medial TMs    Mouth/Throat:     Mouth: Mucous membranes are dry.     Comments: "+" PND Cardiovascular:     Rate and Rhythm: Normal rate and regular rhythm.     Heart sounds: Normal heart sounds.  Pulmonary:     Breath sounds: Wheezing present.     Comments: Decreased globally Lymphadenopathy:     Cervical: No cervical adenopathy.  Neurological:     Mental Status: He is alert.     No results found for any visits on 02/07/23.      Assessment & Plan:   Problem List Items Addressed This Visit       Other   Shortness of breath    Patient with a history of lung disease and heart disease.  Patient is an everyday smoker in setting of increased shortness of breath with hemoptysis obtain stat CT scan of chest to rule out possible tumor on the lungs.  Patient has a anaphylactic allergy to contrast so cannot do with contrast at this juncture.  Will likely  patient has a PE.  No exogenous hormones or long travel as of late.  No history of PE or DVT per patient report      Relevant Orders   CT Chest Wo Contrast   CBC   Basic metabolic panel   Brain natriuretic peptide   Acute cough - Primary    COVID test negative in office.  Patient in the setting of lung disease with increased dyspnea and sputum production possible COPD exacerbation.  Will obtain CT scan of the chest to rule out acute mass for hemoptysis.      Relevant Orders   POC COVID-19   CT Chest Wo Contrast   Brain natriuretic peptide   Lower extremity edema    Patient's baseline is 1+ pitting edema left lower extremity has 2+ today with 1+ on the right lower extremity.  In the setting of heart disease will check BNP.  Patient also had a 12 pound weight gain in 2 months      Relevant Orders   Brain natriuretic peptide   Hemoptysis    Patient had about 12 episodes  of hemoptysis.  Not bright red but I semidark-colored blood.  He has used Flonase 1 time over the past 3 days.  States now he is coughing up black stringy sputum.  In the setting of smoking and lung disease he does spit up sputum but traditionally not colored.  Obtain stat CT scan of chest to rule out lung mass.      Relevant Orders   CT Chest Wo Contrast   CBC   Basic metabolic panel    No orders of the defined types were placed in this encounter.   Return if symptoms worsen or fail to improve.  Audria Nine, NP

## 2023-02-07 NOTE — Assessment & Plan Note (Signed)
Patient with a history of lung disease and heart disease.  Patient is an everyday smoker in setting of increased shortness of breath with hemoptysis obtain stat CT scan of chest to rule out possible tumor on the lungs.  Patient has a anaphylactic allergy to contrast so cannot do with contrast at this juncture.  Will likely patient has a PE.  No exogenous hormones or long travel as of late.  No history of PE or DVT per patient report

## 2023-02-07 NOTE — Assessment & Plan Note (Signed)
Patient had about 12 episodes of hemoptysis.  Not bright red but I semidark-colored blood.  He has used Flonase 1 time over the past 3 days.  States now he is coughing up black stringy sputum.  In the setting of smoking and lung disease he does spit up sputum but traditionally not colored.  Obtain stat CT scan of chest to rule out lung mass.

## 2023-02-07 NOTE — Assessment & Plan Note (Signed)
Patient's baseline is 1+ pitting edema left lower extremity has 2+ today with 1+ on the right lower extremity.  In the setting of heart disease will check BNP.  Patient also had a 12 pound weight gain in 2 months

## 2023-02-07 NOTE — Patient Instructions (Signed)
Nice to see you today I will be in touch with the labs and CT scan once I have the results Follow up with me if you do not improve

## 2023-02-07 NOTE — Telephone Encounter (Signed)
Still unable to reach pt by phone.  Sending note back to [facility] NP and [employer].

## 2023-02-07 NOTE — Assessment & Plan Note (Signed)
COVID test negative in office.  Patient in the setting of lung disease with increased dyspnea and sputum production possible COPD exacerbation.  Will obtain CT scan of the chest to rule out acute mass for hemoptysis.

## 2023-02-07 NOTE — Telephone Encounter (Signed)
Patient seen and evaluated by me in office today

## 2023-02-08 ENCOUNTER — Other Ambulatory Visit: Payer: Self-pay | Admitting: Nurse Practitioner

## 2023-02-08 DIAGNOSIS — J441 Chronic obstructive pulmonary disease with (acute) exacerbation: Secondary | ICD-10-CM

## 2023-02-08 MED ORDER — PREDNISONE 20 MG PO TABS
ORAL_TABLET | ORAL | 0 refills | Status: DC
Start: 2023-02-08 — End: 2023-02-10

## 2023-02-08 MED ORDER — DOXYCYCLINE HYCLATE 100 MG PO TABS: 100.0000 mg | ORAL_TABLET | Freq: Two times a day (BID) | ORAL | 0 refills | Status: AC

## 2023-02-10 ENCOUNTER — Telehealth: Payer: Self-pay | Admitting: Nurse Practitioner

## 2023-02-10 ENCOUNTER — Ambulatory Visit (INDEPENDENT_AMBULATORY_CARE_PROVIDER_SITE_OTHER): Payer: Medicare HMO | Admitting: Family Medicine

## 2023-02-10 ENCOUNTER — Ambulatory Visit (INDEPENDENT_AMBULATORY_CARE_PROVIDER_SITE_OTHER)
Admission: RE | Admit: 2023-02-10 | Discharge: 2023-02-10 | Disposition: A | Discharge status: Home / Self Care | Payer: Medicare HMO | Source: Ambulatory Visit | Attending: Family Medicine | Admitting: Family Medicine

## 2023-02-10 ENCOUNTER — Encounter: Payer: Self-pay | Admitting: Family Medicine

## 2023-02-10 VITALS — BP 138/80 | HR 93 | Temp 98.1°F | Resp 30 | Ht 70.0 in | Wt 251.0 lb

## 2023-02-10 DIAGNOSIS — J441 Chronic obstructive pulmonary disease with (acute) exacerbation: Secondary | ICD-10-CM | POA: Diagnosis not present

## 2023-02-10 DIAGNOSIS — R0609 Other forms of dyspnea: Secondary | ICD-10-CM

## 2023-02-10 DIAGNOSIS — R042 Hemoptysis: Secondary | ICD-10-CM

## 2023-02-10 DIAGNOSIS — Z720 Tobacco use: Secondary | ICD-10-CM | POA: Diagnosis not present

## 2023-02-10 DIAGNOSIS — R051 Acute cough: Secondary | ICD-10-CM | POA: Diagnosis not present

## 2023-02-10 DIAGNOSIS — R062 Wheezing: Secondary | ICD-10-CM

## 2023-02-10 DIAGNOSIS — R918 Other nonspecific abnormal finding of lung field: Secondary | ICD-10-CM | POA: Diagnosis not present

## 2023-02-10 DIAGNOSIS — J40 Bronchitis, not specified as acute or chronic: Secondary | ICD-10-CM | POA: Diagnosis not present

## 2023-02-10 DIAGNOSIS — R0600 Dyspnea, unspecified: Secondary | ICD-10-CM | POA: Diagnosis not present

## 2023-02-10 MED ORDER — ALBUTEROL SULFATE (2.5 MG/3ML) 0.083% IN NEBU
2.5000 mg | INHALATION_SOLUTION | Freq: Once | RESPIRATORY_TRACT | Status: AC
Start: 2023-02-10 — End: 2023-02-10
  Administered 2023-02-10: 2.5 mg via RESPIRATORY_TRACT

## 2023-02-10 MED ORDER — ALBUTEROL SULFATE HFA 108 (90 BASE) MCG/ACT IN AERS
2.0000 | INHALATION_SPRAY | Freq: Four times a day (QID) | RESPIRATORY_TRACT | 1 refills | Status: DC | PRN
Start: 2023-02-10 — End: 2023-06-20

## 2023-02-10 MED ORDER — ALBUTEROL SULFATE (2.5 MG/3ML) 0.083% IN NEBU: 2.5000 mg | INHALATION_SOLUTION | RESPIRATORY_TRACT | 1 refills | Status: DC | PRN

## 2023-02-10 MED ORDER — PREDNISONE 20 MG PO TABS
ORAL_TABLET | ORAL | 0 refills | Status: AC
Start: 2023-02-10 — End: 2023-02-15

## 2023-02-10 MED ORDER — ALBUTEROL SULFATE HFA 108 (90 BASE) MCG/ACT IN AERS
2.0000 | INHALATION_SPRAY | Freq: Four times a day (QID) | RESPIRATORY_TRACT | 1 refills | Status: DC | PRN
Start: 2023-02-10 — End: 2023-02-10

## 2023-02-10 NOTE — Assessment & Plan Note (Signed)
With wheezing/ obst symptoms in pt with bronchitis and likely copd  This did improve after albuterol nmt   Ordered neb machine for home with albuterol Also mdi for outside of home   Cxr -no changes from previous  Re start pred taper from last visit  Follow up next wk for re check  Has pulm follow up in sept

## 2023-02-10 NOTE — Assessment & Plan Note (Signed)
Again counseled on smoking cessation  No doubt this adds to current breathing problems and reactive airways

## 2023-02-10 NOTE — Patient Instructions (Signed)
Start the prednisone taper over   Use either the albuterol inhaler or the nebulizer treatment up to every 4 hours as needed    Update if not starting to improve in a week or if worsening   If severely worse-go to the ER!   Schedule a follow up with [facility] next week  We will contact you about the xray result

## 2023-02-10 NOTE — Assessment & Plan Note (Signed)
Reviewed recent note from pcp as well as labs and CT Cxr today stable More wheezing Improved with alb nmt Will start prednisone taper over  Follow up next week  Continues to consider smoking cessation

## 2023-02-10 NOTE — Telephone Encounter (Signed)
-----   Message from [facility] sent at 02/10/2023 10:58 AM EDT ----- Called patient received medication. Has been on it for 3 days. Has not noticed any improvement. His breathing is harder. Is not able to check oxygen at home. Denies any fever. Just states he feels worse than when he came in and was seen in office. Has not been using albuterol has been out a few day. Needs refill send in. Has been using Trelegy as directed. Verified with patient that he will be at number and can be contacted today.

## 2023-02-10 NOTE — Assessment & Plan Note (Addendum)
Much improved after alb nmt  Prescription machine for  home and also mdi Will start pred taper over Cxr stable Follow up next wk  Call back and Er precautions noted in detail today
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
## 2023-02-10 NOTE — Telephone Encounter (Signed)
Refill sent in. I see he is being seen in office today
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
## 2023-02-10 NOTE — Telephone Encounter (Signed)
Called and spoke to pt. Was going to tell him to use a Good Rx card to get his medication. He actuallly was stating he needed the machine through [facility]. I called [facility] and they were already closed. I selected the emergency line and was disconnectred. I called [facility] to see if they had nebulizers in stock. They do but they do not bill insurance. So I was speaking to the pt and looked on [employer]. Found several nebulizers for a reasonable price and could be delivered late tonight or tomorrow. He will order one when he gets home.

## 2023-02-10 NOTE — Telephone Encounter (Signed)
Patient called in stating that  his insurance  company told him that rxalbuterol (PROVENTIL) (2.5 MG/3ML) 0.083% nebulizer solution  Needs to be sent into [facility] in order for him to get it and it be covered under his insurance.    Phone number: [phone]

## 2023-02-16 ENCOUNTER — Ambulatory Visit (INDEPENDENT_AMBULATORY_CARE_PROVIDER_SITE_OTHER): Payer: Medicare HMO | Admitting: Nurse Practitioner

## 2023-02-16 VITALS — BP 154/88 | HR 92 | Temp 97.6°F | Ht 70.0 in | Wt 250.0 lb

## 2023-02-16 DIAGNOSIS — R0602 Shortness of breath: Secondary | ICD-10-CM

## 2023-02-16 DIAGNOSIS — R062 Wheezing: Secondary | ICD-10-CM | POA: Diagnosis not present

## 2023-02-16 DIAGNOSIS — J449 Chronic obstructive pulmonary disease, unspecified: Secondary | ICD-10-CM | POA: Insufficient documentation

## 2023-02-16 MED ORDER — PREDNISONE 20 MG PO TABS: ORAL_TABLET | ORAL | 0 refills | Status: DC

## 2023-02-16 NOTE — Patient Instructions (Signed)
It is my recommendation that you get back on the amlodipine, irbesartan, isosorbide, clopidogrel (plavix) and baby aspirin at the least

## 2023-02-16 NOTE — Progress Notes (Addendum)
Acute Office Visit  Subjective:     Patient ID: [name], male    DOB: 04-24-1966, 57 y.o.   MRN: [id number]  Chief Complaint  Patient presents with   Medical Management of Chronic Issues    Follow up on breathing last nebulizer around 8am, last albuterol inhaler about 20 min ago. He is using inhaler 4-5 times an hr. Taking nebulizer every 4 hours around the clock     HPI Patient is in today for shortness of breath  Patient was seen by me on 02/07/2023 for acute cough hemoptysis and shortness of breath.  We did obtain a CT scan without contrast.  Patient was started on antibiotics and prednisone.  When staff reach out the patient to review CT scan results noticed his breathing sounded worse patient was reevaluated in office 02/10/2023 by colleague Vinnie Langton, MD.She performed a chest x-ray which came back no acute changes she also restarted patient's prednisone taper.  She recommended close follow-up with primary care provider.  Patient is here today for the follow-up.  Of note patient is followed by cardiology and pulmonology.  Patient is currently supposed to be on Trelegy inhaler daily.  Patient's been using albuterol inhaler and nebulizer  Review of Systems  Constitutional:  Negative for chills and fever.  Respiratory:  Positive for cough, shortness of breath and wheezing.   Cardiovascular:  Positive for leg swelling. Negative for chest pain.  Neurological:  Negative for headaches.        Objective:    BP (!) 154/88   Pulse 92   Temp 97.6 F (36.4 C) (Temporal)   Ht 5\' 10"  (1.778 m)   Wt 250 lb (113.4 kg)   SpO2 94%   BMI 35.87 kg/m  BP Readings from Last 3 Encounters:  02/19/23 (!) 183/102  02/16/23 (!) 154/88  02/10/23 138/80   Wt Readings from Last 3 Encounters:  02/17/23 255 lb (115.7 kg)  02/16/23 250 lb (113.4 kg)  02/10/23 251 lb (113.9 kg)      Physical Exam Vitals and nursing note reviewed.  Constitutional:      Appearance: Normal appearance.   Cardiovascular:     Rate and Rhythm: Normal rate and regular rhythm.     Heart sounds: Normal heart sounds.  Pulmonary:     Effort: Pulmonary effort is normal.     Breath sounds: Wheezing present.  Musculoskeletal:     Right lower leg: 2+ Pitting Edema present.     Left lower leg: 1+ Pitting Edema present.  Neurological:     Mental Status: He is alert.     No results found for any visits on 02/16/23.      Assessment & Plan:   Problem List Items Addressed This Visit       Respiratory   Chronic obstructive pulmonary disease (HCC)    Patient states he is using Trelegy as prescribed-use albuterol many times now along with nebulizers.  Will change prednisone to 60 mg for several days to taper down.  Urgent referral to pulmonology for further evaluation.  Continue inhalers, nebulizer, albuterol as needed        Other   Dyspnea - Primary    Could be multifactorial patient does have lung disease that does not seem to be well-controlled.  Ambulatory referral to pulmonology urgently.  Patient does admit that he stopped taking all his cardiac medications patient does have a history of cardiac stents encourage patient to go back on irbesartan, isosorbide, clopidogrel, aspirin  at the least.  EKG performed in office as patient did stop taking majority of cardiac meds and having dyspnea.  Continue albuterol inhalers.  He has a follow-up with cardiology this month      Relevant Orders   EKG 12-Lead (Completed)   Ambulatory referral to Pulmonology   Wheezing    Still wheezing.  Patient did have a CT of the chest along with chest x-ray patient was treated with antibiotics and steroids steroid taper redosed today      Relevant Orders   Ambulatory referral to Pulmonology    Meds ordered this encounter  Medications   DISCONTD: predniSONE (DELTASONE) 20 MG tablet    Sig: Take 3 tablets (60 mg total) by mouth daily with breakfast for 3 days, THEN 2 tablets (40 mg total) daily with  breakfast for 3 days, THEN 1 tablet (20 mg total) daily with breakfast for 3 days, THEN 0.5 tablets (10 mg total) daily with breakfast for 2 days.    Dispense:  19 tablet    Refill:  0    Order Specific Question:   Supervising Provider    Answer:   TOWER, MARNE A [[id number]]    Return if symptoms worsen or fail to improve.  Audria Nine, NP

## 2023-02-16 NOTE — Assessment & Plan Note (Signed)
Patient states he is using Trelegy as prescribed-use albuterol many times now along with nebulizers.  Will change prednisone to 60 mg for several days to taper down.  Urgent referral to pulmonology for further evaluation.  Continue inhalers, nebulizer, albuterol as needed

## 2023-02-16 NOTE — Assessment & Plan Note (Addendum)
Could be multifactorial patient does have lung disease that does not seem to be well-controlled.  Ambulatory referral to pulmonology urgently.  Patient does admit that he stopped taking all his cardiac medications patient does have a history of cardiac stents encourage patient to go back on irbesartan, isosorbide, clopidogrel, aspirin at the least.  EKG performed in office as patient did stop taking majority of cardiac meds and having dyspnea.  Continue albuterol inhalers.  He has a follow-up with cardiology this month

## 2023-02-16 NOTE — Assessment & Plan Note (Signed)
Still wheezing.  Patient did have a CT of the chest along with chest x-ray patient was treated with antibiotics and steroids steroid taper redosed today

## 2023-02-17 ENCOUNTER — Encounter: Payer: Self-pay | Admitting: Intensive Care

## 2023-02-17 ENCOUNTER — Other Ambulatory Visit: Payer: Self-pay

## 2023-02-17 ENCOUNTER — Emergency Department: Payer: Medicare HMO

## 2023-02-17 ENCOUNTER — Inpatient Hospital Stay
Admission: EM | Admit: 2023-02-17 | Discharge: 2023-02-19 | DRG: 190 | Disposition: A | Discharge status: Home Health | Payer: Medicare HMO | Source: Ambulatory Visit | Attending: Internal Medicine | Admitting: Internal Medicine

## 2023-02-17 DIAGNOSIS — R918 Other nonspecific abnormal finding of lung field: Secondary | ICD-10-CM | POA: Diagnosis not present

## 2023-02-17 DIAGNOSIS — R0600 Dyspnea, unspecified: Secondary | ICD-10-CM | POA: Diagnosis not present

## 2023-02-17 DIAGNOSIS — J9601 Acute respiratory failure with hypoxia: Secondary | ICD-10-CM | POA: Diagnosis not present

## 2023-02-17 DIAGNOSIS — N1831 Chronic kidney disease, stage 3a: Secondary | ICD-10-CM | POA: Diagnosis present

## 2023-02-17 DIAGNOSIS — Z7951 Long term (current) use of inhaled steroids: Secondary | ICD-10-CM | POA: Diagnosis not present

## 2023-02-17 DIAGNOSIS — Z91040 Latex allergy status: Secondary | ICD-10-CM

## 2023-02-17 DIAGNOSIS — I77810 Thoracic aortic ectasia: Secondary | ICD-10-CM | POA: Diagnosis present

## 2023-02-17 DIAGNOSIS — Z720 Tobacco use: Secondary | ICD-10-CM | POA: Diagnosis present

## 2023-02-17 DIAGNOSIS — Z8719 Personal history of other diseases of the digestive system: Secondary | ICD-10-CM

## 2023-02-17 DIAGNOSIS — E669 Obesity, unspecified: Secondary | ICD-10-CM | POA: Diagnosis not present

## 2023-02-17 DIAGNOSIS — Z87442 Personal history of urinary calculi: Secondary | ICD-10-CM

## 2023-02-17 DIAGNOSIS — E785 Hyperlipidemia, unspecified: Secondary | ICD-10-CM | POA: Diagnosis present

## 2023-02-17 DIAGNOSIS — Z6836 Body mass index (BMI) 36.0-36.9, adult: Secondary | ICD-10-CM

## 2023-02-17 DIAGNOSIS — Z7982 Long term (current) use of aspirin: Secondary | ICD-10-CM

## 2023-02-17 DIAGNOSIS — I25119 Atherosclerotic heart disease of native coronary artery with unspecified angina pectoris: Secondary | ICD-10-CM | POA: Diagnosis present

## 2023-02-17 DIAGNOSIS — M545 Low back pain, unspecified: Secondary | ICD-10-CM | POA: Diagnosis present

## 2023-02-17 DIAGNOSIS — Z823 Family history of stroke: Secondary | ICD-10-CM

## 2023-02-17 DIAGNOSIS — F1011 Alcohol abuse, in remission: Secondary | ICD-10-CM | POA: Diagnosis not present

## 2023-02-17 DIAGNOSIS — Z955 Presence of coronary angioplasty implant and graft: Secondary | ICD-10-CM

## 2023-02-17 DIAGNOSIS — Z87892 Personal history of anaphylaxis: Secondary | ICD-10-CM

## 2023-02-17 DIAGNOSIS — K219 Gastro-esophageal reflux disease without esophagitis: Secondary | ICD-10-CM | POA: Diagnosis present

## 2023-02-17 DIAGNOSIS — Z981 Arthrodesis status: Secondary | ICD-10-CM

## 2023-02-17 DIAGNOSIS — Z7182 Exercise counseling: Secondary | ICD-10-CM

## 2023-02-17 DIAGNOSIS — I10 Essential (primary) hypertension: Secondary | ICD-10-CM | POA: Diagnosis not present

## 2023-02-17 DIAGNOSIS — J40 Bronchitis, not specified as acute or chronic: Secondary | ICD-10-CM | POA: Diagnosis not present

## 2023-02-17 DIAGNOSIS — Z8249 Family history of ischemic heart disease and other diseases of the circulatory system: Secondary | ICD-10-CM

## 2023-02-17 DIAGNOSIS — Z1152 Encounter for screening for COVID-19: Secondary | ICD-10-CM

## 2023-02-17 DIAGNOSIS — J441 Chronic obstructive pulmonary disease with (acute) exacerbation: Principal | ICD-10-CM | POA: Diagnosis present

## 2023-02-17 DIAGNOSIS — F17210 Nicotine dependence, cigarettes, uncomplicated: Secondary | ICD-10-CM | POA: Diagnosis not present

## 2023-02-17 DIAGNOSIS — Z8709 Personal history of other diseases of the respiratory system: Secondary | ICD-10-CM

## 2023-02-17 DIAGNOSIS — Z8614 Personal history of Methicillin resistant Staphylococcus aureus infection: Secondary | ICD-10-CM

## 2023-02-17 DIAGNOSIS — Z8041 Family history of malignant neoplasm of ovary: Secondary | ICD-10-CM

## 2023-02-17 DIAGNOSIS — Z83438 Family history of other disorder of lipoprotein metabolism and other lipidemia: Secondary | ICD-10-CM

## 2023-02-17 DIAGNOSIS — F1410 Cocaine abuse, uncomplicated: Secondary | ICD-10-CM | POA: Diagnosis present

## 2023-02-17 DIAGNOSIS — Z880 Allergy status to penicillin: Secondary | ICD-10-CM

## 2023-02-17 DIAGNOSIS — I129 Hypertensive chronic kidney disease with stage 1 through stage 4 chronic kidney disease, or unspecified chronic kidney disease: Secondary | ICD-10-CM | POA: Diagnosis not present

## 2023-02-17 DIAGNOSIS — Z808 Family history of malignant neoplasm of other organs or systems: Secondary | ICD-10-CM

## 2023-02-17 DIAGNOSIS — R0602 Shortness of breath: Secondary | ICD-10-CM | POA: Diagnosis not present

## 2023-02-17 DIAGNOSIS — R0989 Other specified symptoms and signs involving the circulatory and respiratory systems: Secondary | ICD-10-CM | POA: Diagnosis not present

## 2023-02-17 DIAGNOSIS — Z87448 Personal history of other diseases of urinary system: Secondary | ICD-10-CM

## 2023-02-17 DIAGNOSIS — Z811 Family history of alcohol abuse and dependence: Secondary | ICD-10-CM

## 2023-02-17 DIAGNOSIS — Z91030 Bee allergy status: Secondary | ICD-10-CM

## 2023-02-17 DIAGNOSIS — F32A Depression, unspecified: Secondary | ICD-10-CM | POA: Diagnosis present

## 2023-02-17 DIAGNOSIS — G90522 Complex regional pain syndrome I of left lower limb: Secondary | ICD-10-CM | POA: Diagnosis present

## 2023-02-17 DIAGNOSIS — Z8674 Personal history of sudden cardiac arrest: Secondary | ICD-10-CM | POA: Diagnosis not present

## 2023-02-17 DIAGNOSIS — I340 Nonrheumatic mitral (valve) insufficiency: Secondary | ICD-10-CM | POA: Diagnosis not present

## 2023-02-17 DIAGNOSIS — Z713 Dietary counseling and surveillance: Secondary | ICD-10-CM

## 2023-02-17 DIAGNOSIS — G4733 Obstructive sleep apnea (adult) (pediatric): Secondary | ICD-10-CM | POA: Diagnosis not present

## 2023-02-17 DIAGNOSIS — Z813 Family history of other psychoactive substance abuse and dependence: Secondary | ICD-10-CM

## 2023-02-17 DIAGNOSIS — Z716 Tobacco abuse counseling: Secondary | ICD-10-CM

## 2023-02-17 DIAGNOSIS — F419 Anxiety disorder, unspecified: Secondary | ICD-10-CM | POA: Diagnosis present

## 2023-02-17 DIAGNOSIS — Z79899 Other long term (current) drug therapy: Secondary | ICD-10-CM

## 2023-02-17 DIAGNOSIS — Z7902 Long term (current) use of antithrombotics/antiplatelets: Secondary | ICD-10-CM

## 2023-02-17 DIAGNOSIS — Z91041 Radiographic dye allergy status: Secondary | ICD-10-CM

## 2023-02-17 DIAGNOSIS — F1920 Other psychoactive substance dependence, uncomplicated: Secondary | ICD-10-CM | POA: Diagnosis not present

## 2023-02-17 DIAGNOSIS — Z8781 Personal history of (healed) traumatic fracture: Secondary | ICD-10-CM

## 2023-02-17 DIAGNOSIS — Z91013 Allergy to seafood: Secondary | ICD-10-CM

## 2023-02-17 DIAGNOSIS — J849 Interstitial pulmonary disease, unspecified: Secondary | ICD-10-CM | POA: Diagnosis not present

## 2023-02-17 HISTORY — DX: Nonrheumatic mitral (valve) insufficiency: I34.0

## 2023-02-17 LAB — CBC WITH DIFFERENTIAL/PLATELET
Abs Immature Granulocytes: 0.03 10*3/uL (ref 0.00–0.07)
Basophils Absolute: 0 10*3/uL (ref 0.0–0.1)
Basophils Relative: 0 %
Eosinophils Absolute: 0.2 10*3/uL (ref 0.0–0.5)
Eosinophils Relative: 2 %
HCT: 50.2 % (ref 39.0–52.0)
Hemoglobin: 16.5 g/dL (ref 13.0–17.0)
Immature Granulocytes: 0 %
Lymphocytes Relative: 14 %
Lymphs Abs: 1.2 10*3/uL (ref 0.7–4.0)
MCH: 29.4 pg (ref 26.0–34.0)
MCHC: 32.9 g/dL (ref 30.0–36.0)
MCV: 89.5 fL (ref 80.0–100.0)
Monocytes Absolute: 0.2 10*3/uL (ref 0.1–1.0)
Monocytes Relative: 3 %
Neutro Abs: 6.8 10*3/uL (ref 1.7–7.7)
Neutrophils Relative %: 81 %
Platelets: 191 10*3/uL (ref 150–400)
RBC: 5.61 MIL/uL (ref 4.22–5.81)
RDW: 13.2 % (ref 11.5–15.5)
WBC: 8.5 10*3/uL (ref 4.0–10.5)
nRBC: 0 % (ref 0.0–0.2)

## 2023-02-17 LAB — RESPIRATORY PANEL BY PCR

## 2023-02-17 LAB — COMPREHENSIVE METABOLIC PANEL
ALT: 25 U/L (ref 0–44)
AST: 21 U/L (ref 15–41)
Albumin: 3.7 g/dL (ref 3.5–5.0)
Alkaline Phosphatase: 87 U/L (ref 38–126)
Anion gap: 7 (ref 5–15)
BUN: 22 mg/dL — ABNORMAL HIGH (ref 6–20)
CO2: 24 mmol/L (ref 22–32)
Calcium: 8.9 mg/dL (ref 8.9–10.3)
Chloride: 106 mmol/L (ref 98–111)
Creatinine, Ser: 1.15 mg/dL (ref 0.61–1.24)
GFR, Estimated: >60 mL/min (ref >60)
Glucose, Bld: 111 mg/dL — ABNORMAL HIGH (ref 70–99)
Potassium: 4.3 mmol/L (ref 3.5–5.1)
Sodium: 137 mmol/L (ref 135–145)
Total Bilirubin: 0.7 mg/dL (ref 0.3–1.2)
Total Protein: 6.9 g/dL (ref 6.5–8.1)

## 2023-02-17 LAB — TROPONIN I (HIGH SENSITIVITY): Troponin I (High Sensitivity): 9 ng/L (ref <18)

## 2023-02-17 LAB — BRAIN NATRIURETIC PEPTIDE: B Natriuretic Peptide: 29.3 pg/mL (ref 0.0–100.0)

## 2023-02-17 LAB — D-DIMER, QUANTITATIVE: D-Dimer, Quant: <0.27 ug/mL-FEU (ref 0.00–0.50)

## 2023-02-17 LAB — SARS CORONAVIRUS 2 BY RT PCR: SARS Coronavirus 2 by RT PCR: NEGATIVE

## 2023-02-17 MED ORDER — ASPIRIN 81 MG PO TBEC
81.0000 mg | DELAYED_RELEASE_TABLET | Freq: Every day | ORAL | Status: DC
  Administered 2023-02-17 – 2023-02-19 (×3): 81 mg via ORAL
  Filled 2023-02-17 (×3): qty 1

## 2023-02-17 MED ORDER — NITROGLYCERIN 0.4 MG SL SUBL
0.4000 mg | SUBLINGUAL_TABLET | SUBLINGUAL | Status: DC | PRN
  Administered 2023-02-18 (×3): 0.4 mg via SUBLINGUAL
  Filled 2023-02-17 (×3): qty 1

## 2023-02-17 MED ORDER — LEVOFLOXACIN IN D5W 750 MG/150ML IV SOLN
750.0000 mg | INTRAVENOUS | Status: DC
  Administered 2023-02-17 – 2023-02-18 (×2): 750 mg via INTRAVENOUS
  Filled 2023-02-17 (×3): qty 150

## 2023-02-17 MED ORDER — ENOXAPARIN SODIUM 60 MG/0.6ML IJ SOSY
60.0000 mg | PREFILLED_SYRINGE | INTRAMUSCULAR | Status: DC
  Administered 2023-02-17 – 2023-02-18 (×2): 60 mg via SUBCUTANEOUS
  Filled 2023-02-17 (×2): qty 0.6

## 2023-02-17 MED ORDER — IPRATROPIUM-ALBUTEROL 0.5-2.5 (3) MG/3ML IN SOLN
3.0000 mL | RESPIRATORY_TRACT | Status: DC
  Administered 2023-02-17 – 2023-02-19 (×12): 3 mL via RESPIRATORY_TRACT
  Filled 2023-02-17 (×12): qty 3

## 2023-02-17 MED ORDER — FLUTICASONE PROPIONATE 50 MCG/ACT NA SUSP: 2.0000 | Freq: Every day | NASAL | Status: DC | PRN

## 2023-02-17 MED ORDER — FLUOXETINE HCL 20 MG PO CAPS
40.0000 mg | ORAL_CAPSULE | Freq: Every day | ORAL | Status: DC
  Administered 2023-02-17 – 2023-02-19 (×3): 40 mg via ORAL
  Filled 2023-02-17 (×3): qty 2

## 2023-02-17 MED ORDER — BUSPIRONE HCL 10 MG PO TABS
15.0000 mg | ORAL_TABLET | Freq: Two times a day (BID) | ORAL | Status: DC
  Administered 2023-02-17 – 2023-02-19 (×5): 15 mg via ORAL
  Filled 2023-02-17 (×4): qty 2
  Filled 2023-02-17: qty 3

## 2023-02-17 MED ORDER — ISOSORBIDE MONONITRATE ER 60 MG PO TB24
120.0000 mg | ORAL_TABLET | Freq: Every day | ORAL | Status: DC
  Administered 2023-02-17 – 2023-02-19 (×3): 120 mg via ORAL
  Filled 2023-02-17 (×3): qty 2

## 2023-02-17 MED ORDER — FLUTICASONE FUROATE-VILANTEROL 100-25 MCG/ACT IN AEPB
1.0000 | INHALATION_SPRAY | Freq: Every day | RESPIRATORY_TRACT | Status: DC
  Filled 2023-02-17: qty 28

## 2023-02-17 MED ORDER — ALPRAZOLAM 0.25 MG PO TABS
0.2500 mg | ORAL_TABLET | Freq: Once | ORAL | Status: AC
  Administered 2023-02-17: 0.25 mg via ORAL
  Filled 2023-02-17: qty 1

## 2023-02-17 MED ORDER — METHYLPREDNISOLONE SODIUM SUCC 125 MG IJ SOLR
125.0000 mg | Freq: Once | INTRAMUSCULAR | Status: AC
  Administered 2023-02-17: 125 mg via INTRAVENOUS
  Filled 2023-02-17: qty 2

## 2023-02-17 MED ORDER — ROSUVASTATIN CALCIUM 10 MG PO TABS
20.0000 mg | ORAL_TABLET | Freq: Every day | ORAL | Status: DC
  Administered 2023-02-17 – 2023-02-18 (×2): 20 mg via ORAL
  Filled 2023-02-17: qty 2
  Filled 2023-02-17: qty 1
  Filled 2023-02-17: qty 2

## 2023-02-17 MED ORDER — METHYLPREDNISOLONE SODIUM SUCC 40 MG IJ SOLR
40.0000 mg | Freq: Two times a day (BID) | INTRAMUSCULAR | Status: AC
  Administered 2023-02-17 – 2023-02-19 (×4): 40 mg via INTRAVENOUS
  Filled 2023-02-17 (×4): qty 1

## 2023-02-17 MED ORDER — UMECLIDINIUM BROMIDE 62.5 MCG/ACT IN AEPB
1.0000 | INHALATION_SPRAY | Freq: Every day | RESPIRATORY_TRACT | Status: DC
  Filled 2023-02-17: qty 7

## 2023-02-17 MED ORDER — PREDNISONE 20 MG PO TABS: 40.0000 mg | ORAL_TABLET | Freq: Every day | ORAL | Status: DC

## 2023-02-17 MED ORDER — IPRATROPIUM-ALBUTEROL 0.5-2.5 (3) MG/3ML IN SOLN
3.0000 mL | Freq: Once | RESPIRATORY_TRACT | Status: AC
  Administered 2023-02-17: 3 mL via RESPIRATORY_TRACT
  Filled 2023-02-17: qty 3

## 2023-02-17 MED ORDER — IPRATROPIUM-ALBUTEROL 0.5-2.5 (3) MG/3ML IN SOLN
3.0000 mL | Freq: Once | RESPIRATORY_TRACT | Status: AC
  Administered 2023-02-17: 3 mL via RESPIRATORY_TRACT

## 2023-02-17 MED ORDER — IPRATROPIUM-ALBUTEROL 0.5-2.5 (3) MG/3ML IN SOLN: 3.0000 mL | RESPIRATORY_TRACT | Status: DC | PRN

## 2023-02-17 MED ORDER — AMLODIPINE BESYLATE 5 MG PO TABS
5.0000 mg | ORAL_TABLET | Freq: Every day | ORAL | Status: DC
  Administered 2023-02-17 – 2023-02-19 (×3): 5 mg via ORAL
  Filled 2023-02-17 (×3): qty 1

## 2023-02-17 MED ORDER — RANOLAZINE ER 500 MG PO TB12
500.0000 mg | ORAL_TABLET | Freq: Two times a day (BID) | ORAL | Status: DC
  Filled 2023-02-17: qty 1
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
## 2023-02-17 NOTE — Evaluation (Signed)
Occupational Therapy Evaluation Patient Details Name: [name] MRN: [id number] DOB: Apr 24, 1966 Today's Date: 02/17/2023   History of Present Illness 57yo male with recent diagnosis of bronchitis came to ED with worsening dyspnea over the past 3 weeks. PMHx includes baseline SOB since NSTEMI (04/2021), COPD, CAD, current smoker, CKD, HTN, OSA, fused L ankle, and polysubstance abuse.   Clinical Impression   Pt seen for OT evaluation this date. Pt was modified independent in ADL and household functional mobility, living in a 1 story home with his significant other. 1 small step onto a covered porch and 1 step down into bedroom once inside the home. Pt endorses current smoking, no O2 use at home, and shares that he does furniture walk 2/2 L ankle pain that is chronic. Pt notes the doctor has told him he needs an above knee amputation once the pt is "ready." At rest on stretcher, SpO2 92-94% on room air, RR up to mid 20's, and PR in 80's. Pt notes he just returned to the bed from the toilet in his room and was very SOB, currently noting 8/10 perceived SOB. Visibly uncomfortable and utilizing accessory muscles for breathing. Pt currently requires MIN A for LB ADL tasks and CGA for ADL transfers due to current functional impairments (See OT Problem List below). Pt/significant other educated in energy conservation strategies including activity pacing, home/routines modifications, work simplification, AE/DME, prioritizing of meaningful occupations, importance of rest breaks, and falls prevention. Pt/significant other verbalized understanding and pt would benefit from additional skilled OT services during and after hospitalization to maximize recall and carryover of learned techniques and facilitate implementation of learned techniques into daily routines.       Recommendations for follow up therapy are one component of a multi-disciplinary discharge planning process, led by the attending physician.   Recommendations may be updated based on patient status, additional functional criteria and insurance authorization.   Assistance Recommended at Discharge Intermittent Supervision/Assistance  Patient can return home with the following A little help with walking and/or transfers;A little help with bathing/dressing/bathroom;Assist for transportation;Assistance with cooking/housework    Functional Status Assessment  Patient has had a recent decline in their functional status and demonstrates the ability to make significant improvements in function in a reasonable and predictable amount of time.  Equipment Recommendations  None recommended by OT    Recommendations for Other Services       Precautions / Restrictions Precautions Precautions: Fall Restrictions Weight Bearing Restrictions: No      Mobility Bed Mobility Overal bed mobility: Modified Independent                  Transfers Overall transfer level: Needs assistance Equipment used: None Transfers: Sit to/from Stand Sit to Stand: Min guard                  Balance Overall balance assessment: Needs assistance Sitting-balance support: No upper extremity supported, Feet supported Sitting balance-Leahy Scale: Good     Standing balance support: During functional activity Standing balance-Leahy Scale: Fair                             ADL either performed or assessed with clinical judgement   ADL                                         General  ADL Comments: Pt currently requires MIN A For LB ADL Tasks to minimize SOB with bending. CGA for ADL transfers. Easily SOB.     Vision         Perception     Praxis      Pertinent Vitals/Pain Pain Assessment Pain Assessment: No/denies pain (0/10 at rest, 10/10 L ankle pain with weightbearing)     Hand Dominance     Extremity/Trunk Assessment Upper Extremity Assessment Upper Extremity Assessment: Overall WFL for tasks  assessed   Lower Extremity Assessment Lower Extremity Assessment: LLE deficits/detail;Defer to PT evaluation LLE Deficits / Details: L ankle fused, significant pain with weightbearing LLE: Unable to fully assess due to pain       Communication     Cognition Arousal/Alertness: Awake/alert Behavior During Therapy: WFL for tasks assessed/performed Overall Cognitive Status: Within Functional Limits for tasks assessed                                       General Comments       Exercises Other Exercises Other Exercises: Pt/significant other educated in energy conservation strategies including activity pacing, home/routines modifications, AE/DME, falls prevention, and use of rest breaks to support participation and safety. Encouraged use of a shower chair for energy conservation and to adjust water temperature and ensure good air flow to minimize SOB.   Shoulder Instructions      Home Living Family/patient expects to be discharged to:: Private residence Living Arrangements: Spouse/significant other Available Help at Discharge: Family Type of Home: House Home Access: Stairs to enter [employer] of Steps: 1 to enter home with 1 step to porch; 1 step down into bedroom once inside the home Entrance Stairs-Rails: Right;Left;Can reach both Home Layout: One level     Bathroom Shower/Tub: Tub/shower unit         Home Equipment: Agricultural consultant (2 wheels);Rollator (4 wheels);Cane - single point;BSC/3in1;Hand held shower head;Grab bars - tub/shower;Wheelchair - manual   Additional Comments: Pt reports the DME is from his father      Prior Functioning/Environment               Mobility Comments: Pt denies AD use for mobility but does endorse furniture walking 2/2 L ankle pain ADLs Comments: Pt reports modified independent with ADL/IADL, however, becoming increasingly SOB especially with showering. He notes he has to sit for ~31min before he can get  dressed after showering.        OT Problem List: Decreased strength;Pain;Cardiopulmonary status limiting activity;Decreased activity tolerance;Impaired balance (sitting and/or standing);Decreased knowledge of use of DME or AE      OT Treatment/Interventions: Self-care/ADL training;Therapeutic exercise;Therapeutic activities;Energy conservation;DME and/or AE instruction;Patient/family education;Balance training    OT Goals(Current goals can be found in the care plan section) Acute Rehab OT Goals Patient Stated Goal: breathe better and go home OT Goal Formulation: With patient/family Time For Goal Achievement: 03/03/23 Potential to Achieve Goals: Good ADL Goals Pt Will Perform Lower Body Dressing: with modified independence;with adaptive equipment;sit to/from stand Pt Will Transfer to Toilet: with modified independence;ambulating (LRAD) Pt Will Perform Toileting - Clothing Manipulation and hygiene: with modified independence Additional ADL Goal #1: Pt will verbalize plan to implement at least 2 learned ECS into daily ADL/IADL Routines to minimize SOB and over-exertion. Additional ADL Goal #2: Pt will independently utilize learned ECS during ADL/mobility tasks to minimize SOB, 4/4 opportunities.  OT Frequency: Min 1X/week  Co-evaluation              AM-PAC OT "6 Clicks" Daily Activity     Outcome Measure Help from another person eating meals?: None Help from another person taking care of personal grooming?: None Help from another person toileting, which includes using toliet, bedpan, or urinal?: A Little Help from another person bathing (including washing, rinsing, drying)?: A Little Help from another person to put on and taking off regular upper body clothing?: None Help from another person to put on and taking off regular lower body clothing?: A Little 6 Click Score: 21   End of Session    Activity Tolerance: Other (comment) (SOB throughout) Patient left: in bed;with call  bell/phone within reach;with nursing/sitter in room;with family/visitor present  OT Visit Diagnosis: Other abnormalities of gait and mobility (R26.89);Pain Pain - Right/Left: Left Pain - part of body: Ankle and joints of foot                Time: 4742-5956 OT Time Calculation (min): 33 min Charges:  OT General Charges $OT Visit: 1 Visit OT Evaluation $OT Eval Low Complexity: 1 Low OT Treatments $Self Care/Home Management : 23-37 mins  Arman Filter., MPH, MS, OTR/L ascom [phone] 02/17/23, 2:47 PM

## 2023-02-17 NOTE — ED Provider Notes (Signed)
[facility] Provider Note    Event Date/Time   First MD Initiated Contact with Patient 02/17/23 [phone]     (approximate)   History   Shortness of Breath and Cough   HPI  [name] is a 57 y.o. male with recent diagnosis of bronchitis.  He is a heavy smoker.  Has been taking Trelegy as well as albuterol inhaler.  Has been on courses of prednisone as well as antibiotic.  He denies any chest pain.  Having worsening dyspnea which was once only with exertion`` now even at rest.  Feels significant rattle and wheeze.  He has been referred to pulmonology but does not have an appointment until September.     Physical Exam   Triage Vital Signs: ED Triage Vitals  Encounter Vitals Group     BP      Systolic BP Percentile      Diastolic BP Percentile      Pulse      Resp      Temp      Temp src      SpO2      Weight      Height      Head Circumference      Peak Flow      Pain Score      Pain Loc      Pain Education      Exclude from Growth Chart     Most recent vital signs: Vitals:   02/17/23 1030 02/17/23 1036  BP: (!) 151/135 (!) 161/84  Pulse: 95 88  Resp: 19 19  Temp:    SpO2: 98% 97%     Constitutional: Alert  Eyes: Conjunctivae are normal.  Head: Atraumatic. Nose: No congestion/rhinnorhea. Mouth/Throat: Mucous membranes are moist.   Neck: Painless ROM.  Cardiovascular:   Good peripheral circulation. Respiratory: Tachypnea with respirations in the mid 20s.  Speaking in short phrases.  Diffuse expiratory wheeze. Gastrointestinal: Soft and nontender.  Musculoskeletal:  no deformity Neurologic:  MAE spontaneously. No gross focal neurologic deficits are appreciated.  Skin:  Skin is warm, dry and intact. No rash noted. Psychiatric: Mood and affect are normal. Speech and behavior are normal.    ED Results / Procedures / Treatments   Labs (all labs ordered are listed, but only abnormal results are displayed) Labs Reviewed   COMPREHENSIVE METABOLIC PANEL - Abnormal; Notable for the following components:      Result Value   Glucose, Bld 111 (*)    BUN 22 (*)    All other components within normal limits  SARS CORONAVIRUS 2 BY RT PCR  CBC WITH DIFFERENTIAL/PLATELET     EKG  ED ECG REPORT I, Willy Eddy, the attending physician, personally viewed and interpreted this ECG.   Date: 02/17/2023  EKG Time: 8:47  Rate: 80  Rhythm: sinus  Axis: normal  Intervals: normal  ST&T Change: no stemi, no depressions    RADIOLOGY Please see ED Course for my review and interpretation.  I personally reviewed all radiographic images ordered to evaluate for the above acute complaints and reviewed radiology reports and findings.  These findings were personally discussed with the patient.  Please see medical record for radiology report.    PROCEDURES:  Critical Care performed: no  Procedures   MEDICATIONS ORDERED IN ED: Medications  ipratropium-albuterol (DUONEB) 0.5-2.5 (3) MG/3ML nebulizer solution 3 mL (has no administration in time range)  ipratropium-albuterol (DUONEB) 0.5-2.5 (3) MG/3ML nebulizer solution 3 mL (3 mLs  Nebulization Given 02/17/23 0912)  ipratropium-albuterol (DUONEB) 0.5-2.5 (3) MG/3ML nebulizer solution 3 mL (3 mLs Nebulization Given 02/17/23 0851)  methylPREDNISolone sodium succinate (SOLU-MEDROL) 125 mg/2 mL injection 125 mg (125 mg Intravenous Given 02/17/23 0911)  ipratropium-albuterol (DUONEB) 0.5-2.5 (3) MG/3ML nebulizer solution 3 mL (3 mLs Nebulization Given 02/17/23 1032)     IMPRESSION / MDM / ASSESSMENT AND PLAN / ED COURSE  I reviewed the triage vital signs and the nursing notes.                              Differential diagnosis includes, but is not limited to, Asthma, copd, CHF, pna, ptx, malignancy, Pe, anemia  Patient presenting to the ER for evaluation of symptoms as described above.  Based on symptoms, risk factors and considered above differential, this presenting  complaint could reflect a potentially life-threatening illness therefore the patient will be placed on continuous pulse oximetry and telemetry for monitoring.  Laboratory evaluation will be sent to evaluate for the above complaints.      Clinical Course as of 02/17/23 1104  Fri Feb 17, 2023  1610 Chest x-ray on my review and interpretation without evidence of consolidation or infiltrate. [PR]  1056 Patient reassessed.  He was able to ambulate without any hypoxia but became significantly tachypneic to the 30s.  He still having persistent wheeze although his symptoms are improving after nebulizer treatments.  Given the severity of his symptoms I do believe that he will require admission for further medical management and consultation.  Suspect this presentation is most consistent with underlying COPD given his history of smoking.  Will consult hospitalist. [PR]    Clinical Course User Index [PR] Willy Eddy, MD     FINAL CLINICAL IMPRESSION(S) / ED DIAGNOSES   Final diagnoses:  Dyspnea, unspecified type  Bronchitis     Rx / DC Orders   ED Discharge Orders     None        Note:  This document was prepared using Dragon voice recognition software and may include unintentional dictation errors.    Willy Eddy, MD 02/17/23 1104

## 2023-02-17 NOTE — ED Triage Notes (Signed)
Patient brought over from [facility] for wheezing, sob and cough. Reports symptoms have been going on for 3 weeks. Diagnosed with bronchitis and has been seeing his PCP the past three weeks for treatment

## 2023-02-17 NOTE — Progress Notes (Signed)
PT Cancellation Note  Patient Details Name: [name] MRN: [id number] DOB: [birth year]   Cancelled Treatment:    Reason Eval/Treat Not Completed: Medical issues which prohibited therapy Patient worked with OT and was very SOB with minimal activity. Respiratory panel pending. Will evaluate tomorrow as appropriate.    , 02/17/2023, 2:35 PM
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
## 2023-02-17 NOTE — ED Notes (Signed)
When RN entered the pt was at 91%RA laying flat in the bed. Pt's bed was sat up and pt's O2 levels rose to the mid 90s. Pt ambulated in the room and SpO2 levels stayed in the upper 90s, but pt's breathing became more labored and pt was breathing 30+ breaths per minute, using accessory muscles.

## 2023-02-18 ENCOUNTER — Encounter: Payer: Self-pay | Admitting: Internal Medicine

## 2023-02-18 DIAGNOSIS — Z720 Tobacco use: Secondary | ICD-10-CM | POA: Diagnosis not present

## 2023-02-18 DIAGNOSIS — J441 Chronic obstructive pulmonary disease with (acute) exacerbation: Secondary | ICD-10-CM | POA: Diagnosis not present

## 2023-02-18 LAB — BLOOD GAS, ARTERIAL
Acid-base deficit: 0.4 mmol/L (ref 0.0–2.0)
Bicarbonate: 22.9 mmol/L (ref 20.0–28.0)
O2 Content: 2 L/min
O2 Saturation: 98.7 %
Patient temperature: 37
pCO2 arterial: 33 mmHg (ref 32–48)
pH, Arterial: 7.45 (ref 7.35–7.45)
pO2, Arterial: 86 mmHg (ref 83–108)

## 2023-02-18 MED ORDER — LORAZEPAM 2 MG/ML IJ SOLN
1.0000 mg | Freq: Once | INTRAMUSCULAR | Status: AC
  Administered 2023-02-18: 1 mg via INTRAVENOUS
  Filled 2023-02-18: qty 1

## 2023-02-18 MED ORDER — NICOTINE 21 MG/24HR TD PT24
21.0000 mg | MEDICATED_PATCH | Freq: Every day | TRANSDERMAL | Status: DC
  Administered 2023-02-18 – 2023-02-19 (×2): 21 mg via TRANSDERMAL
  Filled 2023-02-18 (×2): qty 1

## 2023-02-18 MED ORDER — ALPRAZOLAM 0.25 MG PO TABS
0.2500 mg | ORAL_TABLET | Freq: Two times a day (BID) | ORAL | Status: DC | PRN
  Administered 2023-02-18 (×2): 0.25 mg via ORAL
  Filled 2023-02-18 (×2): qty 1

## 2023-02-18 MED ORDER — ALUM & MAG HYDROXIDE-SIMETH 200-200-20 MG/5ML PO SUSP
15.0000 mL | ORAL | Status: DC | PRN
  Administered 2023-02-18: 15 mL via ORAL
  Filled 2023-02-18 (×2): qty 30

## 2023-02-18 NOTE — Progress Notes (Signed)
Patient sister update about the patient condition via phone. No any concern at this time.

## 2023-02-18 NOTE — Progress Notes (Signed)
     [facility] REFERRAL    Occupational Therapy * Physical Therapy * Speech Therapy                        DATE: 02/18/23 PATIENT NAME: [name]  Phone: [phone]   PATIENT MRN: [id number]       DIAGNOSIS/DIAGNOSIS CODE: J44.1  DATE OF DISCHARGE: TBD (TOC Discharge Planning in Progress)       PRIMARY CARE PHYSICIAN: Peyton Bottoms       PCP PHONE: [phone]    Dear Provider: [facility].  Fax: [phone]   I certify that I have examined this patient and that occupational/physical/speech therapy is necessary on an outpatient basis.    The patient has expressed interest in completing their recommended course of therapy at your location.  Once a formal order from the patient's primary care physician has been obtained, please contact him/her to schedule an appointment for evaluation at your earliest convenience.   [ X]  Physical Therapy Evaluate and Treat  [  ]  Occupational Therapy Evaluate and Treat  [  ]  Speech Therapy Evaluate and Treat   The patient's primary care physician (listed above) must furnish and be responsible for a formal order such that the recommended services may be furnished while under the primary physician's care, and that the plan of care will be established and reviewed every 30 days (or more often if condition necessitates).

## 2023-02-18 NOTE — Progress Notes (Signed)
Physical Therapy Evaluation Patient Details Name: [name] MRN: [id number] DOB: [birth year] Today's Date: 02/18/2023  History of Present Illness  57yo male with recent diagnosis of bronchitis came to ED with worsening dyspnea over the past 3 weeks. PMHx includes baseline SOB since NSTEMI (04/2021), COPD, CAD, current smoker, CKD, HTN, OSA, fused L ankle, and polysubstance abuse.   Clinical Impression  Patient supine in bed upon arrival, agreeable to be seen for PT evaluation this date. Prior to admission, patient was  modified independent in ADL and household functional mobility, living in a 1 story home with his significant other. Denies AD use, but does endorse furniture walking due to history of chronic ankle pain. At rest, patient Sp02 95-96% and HR: 88. Patient with noticeable increase in accessory muscles with breathing. Patient was Mod I for bed mobility, but require CGA with transfers and ambulation in room due to impairments. Patient SOB 8/10 after ambulation with Sp02: 94-95%. Vitals stable throughout session with activity, but limited due to exertional SOB. PT educating on energy conservation and and rest break importance for management and safety. Patient will benefit from skilled acute PT services to address impairments (see below for additional details) and to improve activity tolerance for functional activities. Will continue to follow acutely.       If plan is discharge home, recommend the following: A little help with walking and/or transfers;A little help with bathing/dressing/bathroom;Assist for transportation;Help with stairs or ramp for entrance   Can travel by private vehicle        Equipment Recommendations None recommended by PT  Recommendations for Other Services       Functional Status Assessment Patient has had a recent decline in their functional status and demonstrates the ability to make significant improvements in function in a reasonable and predictable amount  of time.     Precautions / Restrictions Precautions Precautions: Fall Restrictions Weight Bearing Restrictions: No      Mobility  Bed Mobility Overal bed mobility: Modified Independent             General bed mobility comments: no physical assistance, Mod I increased time required for bed mobility. Increased SOB with exertion, rate 6/10 seated at EOB.    Transfers Overall transfer level: Needs assistance Equipment used: None Transfers: Sit to/from Stand Sit to Stand: Min guard           General transfer comment: CGA to stand from EOB without use of AD. Offered transition to recliner but patient requesting to return to bed at end of session.    Ambulation/Gait Ambulation/Gait assistance: Min guard Gait Distance (Feet): 20 Feet Assistive device: None Gait Pattern/deviations: Step-through pattern, Decreased stance time - left       General Gait Details: increased pain in L ankle with WBing/ambulation, reports this was baseline. Patient able to ambulate x 20 ft without AD and CGA, but frequebntly reaching to touch bed rail or countertop in room for stability when in close reach.  SOB rated 8/10. Vitals stable with activity, but limited due to increasing SOB with exertion. Returned to bed.  Stairs            Wheelchair Mobility     Tilt Bed    Modified Rankin (Stroke Patients Only)       Balance Overall balance assessment: Needs assistance Sitting-balance support: No upper extremity supported, Feet supported Sitting balance-Leahy Scale: Good     Standing balance support: During functional activity, No upper extremity supported Standing balance-Leahy Scale: Fair  Standing balance comment: mild instability with standing initially, improvements with increased time.                             Pertinent Vitals/Pain Pain Assessment Pain Assessment: No/denies pain    Home Living Family/patient expects to be discharged to:: Private  residence Living Arrangements: Spouse/significant other Available Help at Discharge: Family Type of Home: House Home Access: Stairs to enter Entrance Stairs-Rails: Right;Left;Can reach both Entrance Stairs-Number of Steps: 1 to enter home with 1 step to porch; 1 step down into bedroom once inside the home (approx 5-6" height per patient reports)   Home Layout: One level Home Equipment: Agricultural consultant (2 wheels);Rollator (4 wheels);Cane - single point;BSC/3in1;Hand held shower head;Grab bars - tub/shower;Wheelchair - manual Additional Comments: Per patient reports the DME is from his father, was not utilizing any AD prior to admission.    Prior Function               Mobility Comments: denies AD use for mobility but does endorse furniture walking (due to history of ankle pain) ADLs Comments: Pt reports modified independent with ADL/IADL, but experiences SOB limiting tolerance for activities     Hand Dominance   Dominant Hand: Right    Extremity/Trunk Assessment        Lower Extremity Assessment Lower Extremity Assessment: LLE deficits/detail LLE Deficits / Details: L ankle fused, increased pain with WBing tasks.       Communication   Communication: No difficulties  Cognition Arousal/Alertness: Awake/alert Behavior During Therapy: WFL for tasks assessed/performed Overall Cognitive Status: Within Functional Limits for tasks assessed                                          General Comments      Exercises Other Exercises Other Exercises: Educated in energy conservation strategies including activity pacing and rest breaks to support participation and safety with activity   Assessment/Plan    PT Assessment Patient needs continued PT services  PT Problem List Decreased activity tolerance;Decreased mobility;Decreased balance       PT Treatment Interventions DME instruction;Stair training;Gait training;Functional mobility training;Therapeutic  activities;Therapeutic exercise;Balance training;Neuromuscular re-education    PT Goals (Current goals can be found in the Care Plan section)  Acute Rehab PT Goals Patient Stated Goal: "feel better" PT Goal Formulation: With patient Time For Goal Achievement: 03/04/23 Potential to Achieve Goals: Good    Frequency Min 1X/week     Co-evaluation               AM-PAC PT "6 Clicks" Mobility  Outcome Measure Help needed turning from your back to your side while in a flat bed without using bedrails?: None Help needed moving from lying on your back to sitting on the side of a flat bed without using bedrails?: None Help needed moving to and from a bed to a chair (including a wheelchair)?: A Little Help needed standing up from a chair using your arms (e.g., wheelchair or bedside chair)?: A Little Help needed to walk in hospital room?: A Little Help needed climbing 3-5 steps with a railing? : A Little 6 Click Score: 20    End of Session Equipment Utilized During Treatment: Gait belt Activity Tolerance: Other (comment) (Limited secondary to SOB with exertion.) Patient left: in bed;with call bell/phone within reach Nurse Communication: Mobility status  PT Visit Diagnosis: Unsteadiness on feet (R26.81);Other abnormalities of gait and mobility (R26.89)    Time: 9147-8295 PT Time Calculation (min) (ACUTE ONLY): 13 min   Charges:   PT Evaluation $PT Eval Low Complexity: 1 Low   PT General Charges $$ ACUTE PT VISIT: 1 Visit         Creed Copper Fairly, PT, DPT 02/18/23 10:24 AM

## 2023-02-18 NOTE — Plan of Care (Signed)
  Problem: Clinical Measurements: Goal: Cardiovascular complication will be avoided Outcome: Progressing   Problem: Safety: Goal: Ability to remain free from injury will improve Outcome: Progressing   Problem: Pain Managment: Goal: General experience of comfort will improve Outcome: Progressing   Problem: Elimination: Goal: Will not experience complications related to urinary retention Outcome: Progressing   Problem: Elimination: Goal: Will not experience complications related to bowel motility Outcome: Progressing   Problem: Coping: Goal: Level of anxiety will decrease Outcome: Progressing

## 2023-02-18 NOTE — Progress Notes (Addendum)
Progress Note    [name]  WUJ:[id number] DOB: May 16, 1966  DOA: 02/17/2023 PCP: Eden Emms, NP      Brief Narrative:    Medical records reviewed and are as summarized below:  [name] is a 57 y.o. male with medical history significant of multiple medical problems including COPD, CAD status post stents, tobacco use disorder (current smoker), CKD, hypertension, obstructive sleep apnea, polysubstance use disorder, recent stress echo for recurrent chest pain was unremarkable.  He presented to the hospital with cough, chest tightness, congestion and exertional shortness of breath.  Recent CT chest on 02/07/2023 showed mild bronchial wall thickening, smoking-related respiratory bronchiolitis, 3 mm right lower lobe nodule.   He was admitted to the hospital for COPD exacerbation.  He was treated with IV steroids and bronchodilators.     Assessment/Plan:   Principal Problem:   COPD exacerbation (HCC) Active Problems:   Obstructive sleep apnea   HTN (hypertension), benign   Tobacco abuse   Stage 3a chronic kidney disease (CKD) (HCC)   Coronary artery disease involving native coronary artery of native heart with angina pectoris (HCC)   Obesity (BMI 30-39.9)   Body mass index is 36.59 kg/m.  (Morbid obesity)   COPD exacerbation: Continue IV steroids, Levaquin and bronchodilators. Troponins, BNP and D-dimer were negative.   CAD with coronary stent: Continue aspirin, statins and Plavix. Recent stress echo 2 months ago was unremarkable.   Tobacco use disorder: Counseled to stop smoking cigarettes   Other comorbidities include CKD stage IIIa, hypertension, OSA, morbid obesity   Transfer to MedSurg  Diet Order             Diet Heart Room service appropriate? Yes; Fluid consistency: Thin  Diet effective now                            Consultants: Pulmonologist  Procedures: None    Medications:    amLODipine  5 mg Oral Daily   aspirin  EC  81 mg Oral Daily   busPIRone  15 mg Oral BID   enoxaparin (LOVENOX) injection  60 mg Subcutaneous Q24H   FLUoxetine  40 mg Oral Daily   ipratropium-albuterol  3 mL Nebulization Q4H   isosorbide mononitrate  120 mg Oral Daily   methylPREDNISolone (SOLU-MEDROL) injection  40 mg Intravenous Q12H   Followed by   Melene Muller ON 02/20/2023] predniSONE  40 mg Oral Q breakfast   rosuvastatin  20 mg Oral QHS   Continuous Infusions:  levofloxacin (LEVAQUIN) IV Stopped (02/17/23 1613)     Anti-infectives (From admission, onward)    Start     Dose/Rate Route Frequency Ordered Stop   02/17/23 1200  levofloxacin (LEVAQUIN) IVPB 750 mg        750 mg 100 mL/hr over 90 Minutes Intravenous Every 24 hours 02/17/23 1117 02/22/23 1159              Family Communication/Anticipated D/C date and plan/Code Status   DVT prophylaxis: SCDs Start: 02/17/23 1112, Lovenox     Code Status: Full Code  Family Communication: None Disposition Plan: Plan to discharge home tomorrow   Status is: Inpatient Remains inpatient appropriate because: COPD exacerbation       Subjective:   Interval events noted.  He complains of wheezing.  Breathing is a little better.  Objective:    Vitals:   02/18/23 0329 02/18/23 0426 02/18/23 0803 02/18/23 0917  BP:  Marland Kitchen)  181/94  137/64  Pulse:    85  Resp:  (!) 25  20  Temp:  97.8 F (36.6 C)  98 F (36.7 C)  TempSrc:  Oral  Oral  SpO2: 95% 99% 97% 97%  Weight:      Height:       No data found.   Intake/Output Summary (Last 24 hours) at 02/18/2023 1023 Last data filed at 02/18/2023 0400 Gross per 24 hour  Intake 774.3 ml  Output 400 ml  Net 374.3 ml   Filed Weights   02/17/23 0848  Weight: 115.7 kg    Exam:  GEN: NAD SKIN: No rash EYES: No pallor or icterus ENT: MMM CV: RRR PULM: Decreased air entry bilaterally, bilateral expiratory wheezing.  No rales heard ABD: soft, obese, NT, +BS CNS: AAO x 3, non focal EXT: No edema or  tenderness        Data Reviewed:   I have personally reviewed following labs and imaging studies:  Labs: Labs show the following:   Basic Metabolic Panel: Recent Labs  Lab 02/17/23 0905  NA 137  K 4.3  CL 106  CO2 24  GLUCOSE 111*  BUN 22*  CREATININE 1.15  CALCIUM 8.9   GFR Estimated Creatinine Clearance: 90.3 mL/min (by C-G formula based on SCr of 1.15 mg/dL). Liver Function Tests: Recent Labs  Lab 02/17/23 0905  AST 21  ALT 25  ALKPHOS 87  BILITOT 0.7  PROT 6.9  ALBUMIN 3.7   No results for input(s): "LIPASE", "AMYLASE" in the last 168 hours. No results for input(s): "AMMONIA" in the last 168 hours. Coagulation profile No results for input(s): "INR", "PROTIME" in the last 168 hours.  CBC: Recent Labs  Lab 02/17/23 0905  WBC 8.5  NEUTROABS 6.8  HGB 16.5  HCT 50.2  MCV 89.5  PLT 191   Cardiac Enzymes: No results for input(s): "CKTOTAL", "CKMB", "CKMBINDEX", "TROPONINI" in the last 168 hours. BNP (last 3 results) Recent Labs    02/07/23 1250  PROBNP 16.0   CBG: No results for input(s): "GLUCAP" in the last 168 hours. D-Dimer: Recent Labs    02/17/23 1415  DDIMER <0.27   Hgb A1c: No results for input(s): "HGBA1C" in the last 72 hours. Lipid Profile: No results for input(s): "CHOL", "HDL", "LDLCALC", "TRIG", "CHOLHDL", "LDLDIRECT" in the last 72 hours. Thyroid function studies: No results for input(s): "TSH", "T4TOTAL", "T3FREE", "THYROIDAB" in the last 72 hours.  Invalid input(s): "FREET3" Anemia work up: No results for input(s): "VITAMINB12", "FOLATE", "FERRITIN", "TIBC", "IRON", "RETICCTPCT" in the last 72 hours. Sepsis Labs: Recent Labs  Lab 02/17/23 0905  WBC 8.5    Microbiology Recent Results (from the past 240 hour(s))  SARS Coronavirus 2 by RT PCR (hospital order, performed in [facility] hospital lab) *cepheid single result test* Anterior Nasal Swab     Status: None   Collection Time: 02/17/23  9:05 AM   Specimen:  Anterior Nasal Swab  Result Value Ref Range Status   SARS Coronavirus 2 by RT PCR NEGATIVE NEGATIVE Final    Comment: (NOTE) SARS-CoV-2 target nucleic acids are NOT DETECTED.  The SARS-CoV-2 RNA is generally detectable in upper and lower respiratory specimens during the acute phase of infection. The lowest concentration of SARS-CoV-2 viral copies this assay can detect is 250 copies / mL. A negative result does not preclude SARS-CoV-2 infection and should not be used as the sole basis for treatment or other patient management decisions.  A negative result may occur with  improper specimen collection / handling, submission of specimen other than nasopharyngeal swab, presence of viral mutation(s) within the areas targeted by this assay, and inadequate number of viral copies (<250 copies / mL). A negative result must be combined with clinical observations, patient history, and epidemiological information.  Fact Sheet for Patients:   RoadLapTop.co.za  Fact Sheet for Healthcare Providers: http://kim-miller.com/  This test is not yet approved or  cleared by the [place] FDA and has been authorized for detection and/or diagnosis of SARS-CoV-2 by FDA under an Emergency Use Authorization (EUA).  This EUA will remain in effect (meaning this test can be used) for the duration of the COVID-19 declaration under Section 564(b)(1) of the Act, 21 U.S.C. section 360bbb-3(b)(1), unless the authorization is terminated or revoked sooner.  Performed at [facility], [address] [place], [place] [address]   Respiratory (~20 pathogens) panel by PCR     Status: None   Collection Time: 02/17/23  5:24 PM   Specimen: Nasopharyngeal Swab; Respiratory  Result Value Ref Range Status   Adenovirus NOT DETECTED NOT DETECTED Final   Coronavirus 229E NOT DETECTED NOT DETECTED Final    Comment: (NOTE) The Coronavirus on the Respiratory Panel, DOES  NOT test for the novel  Coronavirus (2019 nCoV)    Coronavirus HKU1 NOT DETECTED NOT DETECTED Final   Coronavirus NL63 NOT DETECTED NOT DETECTED Final   Coronavirus OC43 NOT DETECTED NOT DETECTED Final   Metapneumovirus NOT DETECTED NOT DETECTED Final   Rhinovirus / Enterovirus NOT DETECTED NOT DETECTED Final   Influenza A NOT DETECTED NOT DETECTED Final   Influenza B NOT DETECTED NOT DETECTED Final   Parainfluenza Virus 1 NOT DETECTED NOT DETECTED Final   Parainfluenza Virus 2 NOT DETECTED NOT DETECTED Final   Parainfluenza Virus 3 NOT DETECTED NOT DETECTED Final   Parainfluenza Virus 4 NOT DETECTED NOT DETECTED Final   Respiratory Syncytial Virus NOT DETECTED NOT DETECTED Final   Bordetella pertussis NOT DETECTED NOT DETECTED Final   Bordetella Parapertussis NOT DETECTED NOT DETECTED Final   Chlamydophila pneumoniae NOT DETECTED NOT DETECTED Final   Mycoplasma pneumoniae NOT DETECTED NOT DETECTED Final    Comment: Performed at [facility], [address] [place], [place] [address]    Procedures and diagnostic studies:  DG Chest Portable 1 View  Result Date: 02/17/2023 CLINICAL DATA:  sob EXAM: PORTABLE CHEST 1 VIEW COMPARISON:  02/10/2023. FINDINGS: Low lung volume. Redemonstration of chronic interstitial opacities, with lower lobe predominance, grossly similar to the prior study. However, there are superimposed heterogeneous opacities overlying the left lower lung zone without associated volume loss. These are concerning for pneumonitis. Follow-up to clearing is suggested. Correlate clinically to determine the need for additional imaging. There is linear area of atelectasis overlying the right mid lung zone. Bilateral lung fields are otherwise clear. Bilateral costophrenic angles are clear. Normal cardio-mediastinal silhouette. No acute osseous abnormalities. The soft tissues are within normal limits. IMPRESSION: 1. Chronic interstitial lung disease. 2. Superimposed heterogeneous  opacities overlying the left lower lung zone concerning for pneumonitis. Follow-up to clearing is suggested. Electronically Signed   By: Jules Schick M.D.   On: 02/17/2023 09:47               LOS: 1 day      [facility]   Pager on [email or web address]. If 7PM-7AM, please contact night-coverage at [email or web address]     02/18/2023, 10:23 AM

## 2023-02-18 NOTE — TOC Initial Note (Addendum)
Transition of Care [facility]) - Initial/Assessment Note    Patient Details  Name: [name] MRN: [id number] Date of Birth: [birth year]  Transition of Care [facility]) CM/SW Contact:    Bing Quarry, RN Phone Number: 02/18/2023, 11:28 AM  Clinical Narrative: 02/18/23: Sherron Monday with  patient, explained RN CM role in discharge planning. Reviewed assessment items: no issues with transportation, medications, stairs, lives in single level home with one 1/2 size step into house. Has DME at home but is not currently needing/using. Has not used any HH agency before and has no preference. Has a PCP. Lives with girlfriend. Was agreeable to [facility] services on discharge after this was explained but then remarked he has 4 dogs that are aggressively over-friendly and felt that going to outpatient therapy was doable and a better option given the dog situation. OP rehab choices offered and chose to come to [facility] OP Rehab Service on [facility]. Explained this process and to reach out to OP services after discharge event hough RN CM will internally route the order once signed to [facility] OP Rehab at [phone] (Fax) and phone [phone].  No further questions or concerns at this time. TOC to follow through discharge.   NOTE: Pulmonary provider note indicated patient wishes to pursue smoking cessation.    Progress note order placed for so-sign  [facility] OP Rehab Services on discharge per patient choice.   Plan on discharge [facility]):  [facility] at [phone] (Fax) and phone [phone]. PCP: Peyton Bottoms NP [phone]  Gabriel Cirri MSN RN CM  Transitions of Care Department [facility] [phone] Weekends Only                             Expected Discharge Plan: Home w Home Health Services     Patient Goals and CMS Choice   CMS Medicare.gov Compare Post Acute Care list provided to:: Patient Choice offered to / list presented to : Patient      Expected Discharge Plan and Services   Discharge Planning Services:  CM Consult Post Acute Care Choice: NA Living arrangements for the past 2 months: Single Family Home                 DME Arranged: N/A (Pateint states he has equipment but does not need to use it now.) DME Agency: NA       HH Arranged: NA HH Agency: NA        Prior Living Arrangements/Services Living arrangements for the past 2 months: Single Family Home Lives with:: Domestic Partner Patient language and need for interpreter reviewed:: No Do you feel safe going back to the place where you live?: Yes        Care giver support system in place?:  (Lives with girfriend)   Criminal Activity/Legal Involvement Pertinent to Current Situation/Hospitalization: No - Comment as needed  Activities of Daily Living Home Assistive Devices/Equipment: None ADL Screening (condition at time of admission) Patient's cognitive ability adequate to safely complete daily activities?: Yes Is the patient deaf or have difficulty hearing?: No Does the patient have difficulty seeing, even when wearing glasses/contacts?: No Does the patient have difficulty concentrating, remembering, or making decisions?: No Patient able to express need for assistance with ADLs?: Yes Does the patient have difficulty dressing or bathing?: No Independently performs ADLs?: Yes (appropriate for developmental age) Does the patient have difficulty walking or climbing stairs?: No Weakness of Legs: Both Weakness of Arms/Hands: None  Permission Sought/Granted Permission sought to share information with : Case Manager             Permission granted to share info w Contact Information: Outpatient Rehab Services at [facility]  Emotional Assessment Appearance:: Appears stated age Attitude/Demeanor/Rapport: Engaged     Alcohol / Substance Use: Tobacco Use, Alcohol Use (Htx of polysubstance abuse, interested is smoking cessation.) Psych Involvement: No (comment)  Admission diagnosis:  Bronchitis [J40] COPD exacerbation (HCC)  [J44.1] Dyspnea, unspecified type [R06.00] Patient Active Problem List   Diagnosis Date Noted   COPD exacerbation (HCC) 02/17/2023   Obesity (BMI 30-39.9) 02/17/2023   Chronic obstructive pulmonary disease (HCC) 02/16/2023   Acute cough 02/07/2023   Lower extremity edema 02/07/2023   Hemoptysis 02/07/2023   Preventative health care 12/05/2022   Hypotension due to drugs 10/03/2022   Syncope 10/03/2022   Grief 09/05/2022   Depression, major, single episode, severe (HCC) 03/30/2022   Coronary artery disease involving native coronary artery of native heart with angina pectoris (HCC) 09/07/2021   Dyspnea 08/27/2021   Wheezing 08/27/2021   History of heart artery stent 08/27/2021   COVID-19 07/16/2021   Chronic kidney disease, stage 2 (mild) 06/25/2021   NSTEMI (non-ST elevated myocardial infarction) (HCC) 04/30/2021   Prediabetes 04/30/2021   Stage 3a chronic kidney disease (CKD) (HCC) 04/30/2021   Acute renal failure due to angiotensin converting enzyme (ACE) inhibitor (HCC) 02/28/2016   Post-traumatic arthritis of left ankle 02/25/2016   Alcohol abuse 11/03/2015   Cocaine abuse (HCC) 11/03/2015   Tobacco abuse 07/31/2013   Obstructive sleep apnea 06/21/2013   HTN (hypertension), benign 06/21/2013   RSD (reflex sympathetic dystrophy), L foot due to calcaneus fx 08/14/2012   Malunion of fracture 08/10/2012   Compression fracture of lumbar vertebra (HCC) 06/21/2012    Class: Acute   Calcaneus fracture, left 06/21/2012    Class: Acute   ESOPHAGITIS, REFLUX 03/20/2009   Chest pain 03/20/2009   Major depressive disorder, recurrent episode, moderate (HCC) 11/11/2008   PCP:  Eden Emms, NP Pharmacy:   [facility] [address] [place] - [address] [place] [address] Phone: [phone] Fax: [phone]  [facility] [address] [place] [address] Phone: [phone] Fax:  [phone]  [id number] - [place], [place] - [address] [place] [address] Phone: [phone] Fax: [phone]     Social Determinants of Health (SDOH) Social History: SDOH Screenings   Food Insecurity: No Food Insecurity (02/17/2023)  Housing: Low Risk  (02/17/2023)  Transportation Needs: No Transportation Needs (02/17/2023)  Utilities: Not At Risk (02/17/2023)  Depression (PHQ2-9): High Risk (12/05/2022)  Social Connections: Unknown (11/18/2021)   Received from [facility]  Tobacco Use: High Risk (02/17/2023)   Received from [facility]   SDOH Interventions:     Readmission Risk Interventions     No data to display

## 2023-02-18 NOTE — Plan of Care (Signed)

## 2023-02-18 NOTE — Progress Notes (Signed)
Patient transferred to 2A via bed in stable condition with RN.

## 2023-02-18 NOTE — Progress Notes (Signed)
I was called to see patient because he complained of chest pain and difficulty breathing.  He had been transferred from progressive cardiac unit to MedSurg unit earlier this morning because he was doing better.    By the time I saw him, his chest pain had resolved.  He said he had nitroglycerin which relieved his chest pain.  He still complaining of trouble breathing.  He also complains of anxiety and wants something to calm him down make him sleepy.  He said he does not use any alcohol but admits to using cocaine.  Last use of cocaine was about a week prior to admission.  He is tachypneic and breathing is labored.  He has significant wheezing on exam.  Oxygen saturation was 96 to 98% on room air.  However, because of labored breathing, he will be placed on BiPAP.  Obtain ABG for further evaluation.  Transfer back to progressive cardiac unit because of worsening respiratory status and monitor closely on telemetry.

## 2023-02-18 NOTE — Progress Notes (Signed)
Initial Nutrition Assessment  DOCUMENTATION CODES:   Obesity unspecified  INTERVENTION:  - Heart Healthy diet.  - If intake poor (<50%) recommend Ensure Plus High Protein po BID, each supplement provides 350 kcal and 20 grams of protein. - Monitor weight trends.  NUTRITION DIAGNOSIS:   Increased nutrient needs related to acute illness as evidenced by estimated needs.  GOAL:   Patient will meet greater than or equal to 90% of their needs  MONITOR:   PO intake, Weight trends  REASON FOR ASSESSMENT:   Consult Assessment of nutrition requirement/status  ASSESSMENT:   57 y.o. male with PMH significant for COPD, CAD s/p stents, tobacco use disorder (current smoker), CKD, HTN, OSA, polysubstance use disorder who presented with cough, chest tightness, congestion and exertional SOB. Admitted to the hospital for COPD exacerbation.  Attempted to speak with patient via bedside telephone but no answer.   Per EMR, patient's weight has been stable the past year. Notable weight gain over the past ~3 months. He is documented to have consumed 100% of breakfast this AM.   Medications reviewed and include: -  Labs reviewed:  No BMP today   NUTRITION - FOCUSED PHYSICAL EXAM:  RD working remotely  Diet Order:   Diet Order             Diet Heart Room service appropriate? Yes; Fluid consistency: Thin  Diet effective now                   EDUCATION NEEDS:  Not appropriate for education at this time  Skin:  Skin Assessment: Reviewed RN Assessment  Last BM:  8/2  Height:  Ht Readings from Last 1 Encounters:  02/17/23 5\' 10"  (1.778 m)   Weight:  Wt Readings from Last 1 Encounters:  02/17/23 115.7 kg   Ideal Body Weight:  75.45 kg  BMI:  Body mass index is 36.59 kg/m.  Estimated Nutritional Needs:  Kcal:  2000-2200 kcals Protein:  90-115 grams Fluid:  >/= 2L    Shelle Iron RD, LDN For contact information, refer to [facility].
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
## 2023-02-18 NOTE — Progress Notes (Addendum)
Patient complained chest pain and difficulty in breathing.Tab.nitroglycerine SL given and administered 2 lit oxygen via nasal cannula. He is tachypneic and labored breathing with expiratory wheezing. MD made aware. EKG done.medicine given for anxiety as order.we will monitor closely.

## 2023-02-19 DIAGNOSIS — J441 Chronic obstructive pulmonary disease with (acute) exacerbation: Secondary | ICD-10-CM | POA: Diagnosis not present

## 2023-02-19 MED ORDER — ALPRAZOLAM 0.25 MG PO TABS
0.2500 mg | ORAL_TABLET | Freq: Two times a day (BID) | ORAL | Status: DC | PRN
  Administered 2023-02-19: 0.25 mg via ORAL
  Filled 2023-02-19: qty 1

## 2023-02-19 MED ORDER — NICOTINE 21 MG/24HR TD PT24: 21.0000 mg | MEDICATED_PATCH | Freq: Every day | TRANSDERMAL | Status: AC

## 2023-02-19 MED ORDER — PREDNISONE 20 MG PO TABS: ORAL_TABLET | ORAL | Status: AC

## 2023-02-19 MED ORDER — IPRATROPIUM-ALBUTEROL 0.5-2.5 (3) MG/3ML IN SOLN: 3.0000 mL | RESPIRATORY_TRACT | 0 refills | Status: DC

## 2023-02-19 MED ORDER — NICOTINE 14 MG/24HR TD PT24: 14.0000 mg | MEDICATED_PATCH | TRANSDERMAL | Status: AC

## 2023-02-19 MED ORDER — NICOTINE 7 MG/24HR TD PT24: 7.0000 mg | MEDICATED_PATCH | TRANSDERMAL | Status: AC

## 2023-02-19 NOTE — Plan of Care (Signed)
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
## 2023-02-20 ENCOUNTER — Telehealth: Payer: Self-pay | Admitting: *Deleted

## 2023-02-20 NOTE — Discharge Summary (Signed)
Physician Discharge Summary   Patient: [name] MRN: [id number] DOB: 11-Feb-1966  Admit date:     02/17/2023  Discharge date: 02/19/2023  Discharge Physician: Lurene Shadow   PCP: Eden Emms, NP   Recommendations at discharge:   Follow-up with PCP in 1 week Follow-up with pulmonologist as scheduled (office will call to schedule appointment)  Discharge Diagnoses: Principal Problem:   COPD exacerbation (HCC) Active Problems:   Obstructive sleep apnea   HTN (hypertension), benign   Tobacco abuse   Stage 3a chronic kidney disease (CKD) (HCC)   Coronary artery disease involving native coronary artery of native heart with angina pectoris (HCC)   Obesity (BMI 30-39.9)  Resolved Problems:   * No resolved hospital problems. *  Hospital Course:  [name] is a 57 y.o. male with medical history significant of multiple medical problems including COPD, CAD status post stents, tobacco use disorder (current smoker), CKD, hypertension, obstructive sleep apnea, polysubstance use disorder, recent stress echo for recurrent chest pain was unremarkable.  He presented to the hospital with cough, chest tightness, congestion and exertional shortness of breath.  Recent CT chest on 02/07/2023 showed mild bronchial wall thickening, smoking-related respiratory bronchiolitis, 3 mm right lower lobe nodule.     He was admitted to the hospital for COPD exacerbation.  He was treated with IV steroids and bronchodilators.         Assessment and Plan:   COPD exacerbation: Improved.  He will be discharged on prednisone taper.  Continue bronchodilators at home.   Troponins, BNP and D-dimer were negative.     Acute hypoxic respiratory failure: Resolved   CAD with coronary stent: Continue aspirin, statins and Plavix. Recent stress echo 2 months ago was unremarkable.     Tobacco use disorder: Counseled to stop smoking cigarettes     Other comorbidities include CKD stage IIIa, hypertension, OSA,  morbid obesity   His condition has improved and he is deemed stable for discharge to home today.  Discharge plan was discussed with the patient and his girlfriend at the bedside.         Consultants: Pulmonologist Procedures performed: None Disposition: Home Diet recommendation:  Discharge Diet Orders (From admission, onward)     Start     Ordered   02/19/23 0000  Diet - low sodium heart healthy        02/19/23 0913           Cardiac diet DISCHARGE MEDICATION: Allergies as of 02/19/2023       Reactions   Bee Venom Swelling   Severe swelling   Fish Allergy Anaphylaxis   Throat swelling with seafood   Iodinated Contrast Media Anaphylaxis   Latex Anaphylaxis   Penicillins Anaphylaxis   Has patient had a PCN reaction causing immediate rash, facial/tongue/throat swelling, SOB or lightheadedness with hypotension: Yes Has patient had a PCN reaction causing severe rash involving mucus membranes or skin necrosis: No Has patient had a PCN reaction that required hospitalization No Has patient had a PCN reaction occurring within the last 10 years: No If all of the above answers are "NO", then may proceed with Cephalosporin use.   Bee Venom    Iodinated Contrast Media    CARDIAC ARREST   Latex    Penicillins    Shellfish Allergy         Medication List     STOP taking these medications    FLUoxetine 40 MG capsule Commonly known as: PROZAC   ranolazine  500 MG 12 hr tablet Commonly known as: Ranexa       TAKE these medications    albuterol 108 (90 Base) MCG/ACT inhaler Commonly known as: VENTOLIN HFA Inhale 2 puffs into the lungs every 6 (six) hours as needed for wheezing or shortness of breath. What changed: Another medication with the same name was removed. Continue taking this medication, and follow the directions you see here.   amLODipine 10 MG tablet Commonly known as: NORVASC Take 0.5 tablets (5 mg total) by mouth daily.   Aspirin Low Dose 81 MG  tablet Generic drug: aspirin EC Take 1 tablet (81 mg total) by mouth daily. Swallow whole.   busPIRone 15 MG tablet Commonly known as: BUSPAR Take 1 tablet (15 mg total) by mouth 2 (two) times daily.   clopidogrel 75 MG tablet Commonly known as: PLAVIX Take 1 tablet (75 mg total) by mouth daily.   diphenhydrAMINE 25 mg capsule Commonly known as: BENADRYL Take 25 mg by mouth every 6 (six) hours as needed. Takes as needed only   EPINEPHrine 0.3 mg/0.3 mL Soaj injection Commonly known as: EPI-PEN INJECT 0.3 MLS (0.3 MG TOTAL) INTO THE MUSCLE AS NEEDED FOR ANAPHYLAXIS.   fluticasone 50 MCG/ACT nasal spray Commonly known as: FLONASE Place 2 sprays into both nostrils daily as needed for allergies or rhinitis.   ipratropium-albuterol 0.5-2.5 (3) MG/3ML Soln Commonly known as: DUONEB Take 3 mLs by nebulization every 4 (four) hours.   irbesartan 150 MG tablet Commonly known as: AVAPRO Take 1 tablet (150 mg total) by mouth daily.   isosorbide mononitrate 120 MG 24 hr tablet Commonly known as: IMDUR Take 1 tablet (120 mg total) by mouth daily.   nicotine 14 mg/24hr patch Commonly known as: NICODERM CQ - dosed in mg/24 hours Place 1 patch (14 mg total) onto the skin daily for 14 days.   nicotine 7 mg/24hr patch Commonly known as: NICODERM CQ - dosed in mg/24 hr Place 1 patch (7 mg total) onto the skin daily for 14 days.   nicotine 21 mg/24hr patch Commonly known as: NICODERM CQ - dosed in mg/24 hours Place 1 patch (21 mg total) onto the skin daily for 28 days.   nitroGLYCERIN 0.4 MG SL tablet Commonly known as: NITROSTAT DISSOLVE ONE TABLET UNDER THE TONGUE EVERY 5 MINUTES AS NEEDED FOR CHEST PAIN.  DO NOT EXCEED A TOTAL OF 3 DOSES IN 15 MINUTES   predniSONE 20 MG tablet Commonly known as: DELTASONE Take 2 tablets (40 mg total) by mouth daily with breakfast for 4 days, THEN 1.5 tablets (30 mg total) daily with breakfast for 2 days, THEN 1 tablet (20 mg total) daily with  breakfast for 2 days, THEN 0.5 tablets (10 mg total) daily with breakfast for 2 days. Start taking on: February 20, 2023 What changed: See the new instructions.   rosuvastatin 20 MG tablet Commonly known as: CRESTOR Take 1 tablet by mouth once daily   Trelegy Ellipta 100-62.5-25 MCG/ACT Aepb Generic drug: Fluticasone-Umeclidin-Vilant Inhale 1 puff into the lungs daily.        Follow-up Information     Eden Emms, NP Follow up.   Specialties: Nurse Practitioner, Family Medicine Contact information: [address] [place] [address] [phone]                Discharge Exam: Ceasar Mons Weights   02/17/23 0848  Weight: 115.7 kg   GEN: NAD SKIN: Warm and dry EYES: No pallor or icterus ENT: MMM  CV: RRR PULM: Clear entry adequate bilaterally.  Occasional wheezing.  No rales. ABD: soft, obese, NT, +BS CNS: AAO x 3, non focal EXT: No edema or tenderness   Condition at discharge: good  The results of significant diagnostics from this hospitalization (including imaging, microbiology, ancillary and laboratory) are listed below for reference.   Imaging Studies: DG Chest Portable 1 View  Result Date: 02/17/2023 CLINICAL DATA:  sob EXAM: PORTABLE CHEST 1 VIEW COMPARISON:  02/10/2023. FINDINGS: Low lung volume. Redemonstration of chronic interstitial opacities, with lower lobe predominance, grossly similar to the prior study. However, there are superimposed heterogeneous opacities overlying the left lower lung zone without associated volume loss. These are concerning for pneumonitis. Follow-up to clearing is suggested. Correlate clinically to determine the need for additional imaging. There is linear area of atelectasis overlying the right mid lung zone. Bilateral lung fields are otherwise clear. Bilateral costophrenic angles are clear. Normal cardio-mediastinal silhouette. No acute osseous abnormalities. The soft tissues are within normal limits. IMPRESSION: 1. Chronic  interstitial lung disease. 2. Superimposed heterogeneous opacities overlying the left lower lung zone concerning for pneumonitis. Follow-up to clearing is suggested. Electronically Signed   By: Jules Schick M.D.   On: 02/17/2023 09:47   DG Chest 2 View  Result Date: 02/10/2023 CLINICAL DATA:  Dyspnea in setting of bronchitis with wheezing. EXAM: CHEST - 2 VIEW COMPARISON:  Chest radiographs 08/26/2021 and 04/30/2021 FINDINGS: Cardiac silhouette and mediastinal contours are within normal limits. Mild chronic interstitial thickening and diffuse bilateral predominantly upper lung reticulonodular opacities corresponding to the chronic centrilobular ground-glass densities seen on 02/07/2023 and 09/06/2014 prior CTs, chronic. No acute airspace opacity is seen. No pleural effusion pneumothorax. Flattening of the diaphragms and moderate hyperinflation. Mild multilevel degenerative disc changes of the thoracic spine. Is similar to prior. IMPRESSION: 1. No acute cardiopulmonary process. 2. Chronic interstitial thickening and diffuse bilateral predominantly upper lung reticulonodular opacities, similar to multiple prior radiographs and CTs. This can be seen with numerous chronic processes including smoking-related respiratory bronchiolitis as described on prior CT. Electronically Signed   By: Neita Garnet M.D.   On: 02/10/2023 13:04   CT Chest Wo Contrast  Result Date: 02/08/2023 CLINICAL DATA:  Hemoptysis EXAM: CT CHEST WITHOUT CONTRAST TECHNIQUE: Multidetector CT imaging of the chest was performed following the standard protocol without IV contrast. RADIATION DOSE REDUCTION: This exam was performed according to the departmental dose-optimization program which includes automated exposure control, adjustment of the mA and/or kV according to patient size and/or use of iterative reconstruction technique. COMPARISON:  CT chest, abdomen and pelvis dated September 06, 2014 FINDINGS: Cardiovascular: Normal heart size. No  pericardial effusion. Severe coronary artery calcifications. Normal caliber thoracic aorta with moderate calcified plaque. Mediastinum/Nodes: Esophagus thyroid are unremarkable. No enlarged lymph nodes seen in the chest. Lungs/Pleura: Central airways are patent. Mild bronchial wall thickening. Mild centrilobular emphysema. Motion artifact somewhat limits evaluation of the lung parenchyma, especially in the lower lungs. Ill-defined centrilobular ground-glass nodules primarily in the upper lungs. Scattered small solid pulmonary nodules. New nodule of the right lower lobe measuring 3 mm on series 5, image 80, additional scattered solid nodules are stable. Mild linear opacities of the left lower lobe, likely due to scarring or atelectasis. No pleural effusion. Upper Abdomen: Nonobstructing punctate right renal stone. Musculoskeletal: No chest wall mass or suspicious bone lesions identified. IMPRESSION: 1. Mild bronchial wall thickening, findings can be seen in the setting of bronchitis. 2. Ill-defined centrilobular ground-glass nodules primarily in the upper lungs, findings  are similar to prior exam and compatible with smoking related respiratory bronchiolitis. 3. New 3 mm nodule of the right lower lobe. No follow-up needed if patient is low-risk. Non-contrast chest CT can be considered in 12 months if patient is high-risk. This recommendation follows the consensus statement: Guidelines for Management of Incidental Pulmonary Nodules Detected on CT Images: From the Fleischner Society 2017; Radiology 2017; 284:228-243. 4. Aortic Atherosclerosis (ICD10-I70.0) and Emphysema (ICD10-J43.9). Electronically Signed   By: Allegra Lai M.D.   On: 02/08/2023 08:19    Microbiology: Results for orders placed or performed during the hospital encounter of 02/17/23  SARS Coronavirus 2 by RT PCR (hospital order, performed in [facility] hospital lab) *cepheid single result test* Anterior Nasal Swab     Status: None   Collection  Time: 02/17/23  9:05 AM   Specimen: Anterior Nasal Swab  Result Value Ref Range Status   SARS Coronavirus 2 by RT PCR NEGATIVE NEGATIVE Final    Comment: (NOTE) SARS-CoV-2 target nucleic acids are NOT DETECTED.  The SARS-CoV-2 RNA is generally detectable in upper and lower respiratory specimens during the acute phase of infection. The lowest concentration of SARS-CoV-2 viral copies this assay can detect is 250 copies / mL. A negative result does not preclude SARS-CoV-2 infection and should not be used as the sole basis for treatment or other patient management decisions.  A negative result may occur with improper specimen collection / handling, submission of specimen other than nasopharyngeal swab, presence of viral mutation(s) within the areas targeted by this assay, and inadequate number of viral copies (<250 copies / mL). A negative result must be combined with clinical observations, patient history, and epidemiological information.  Fact Sheet for Patients:   RoadLapTop.co.za  Fact Sheet for Healthcare Providers: http://kim-miller.com/  This test is not yet approved or  cleared by the [place] FDA and has been authorized for detection and/or diagnosis of SARS-CoV-2 by FDA under an Emergency Use Authorization (EUA).  This EUA will remain in effect (meaning this test can be used) for the duration of the COVID-19 declaration under Section 564(b)(1) of the Act, 21 U.S.C. section 360bbb-3(b)(1), unless the authorization is terminated or revoked sooner.  Performed at [facility], [address] [place], [place] [address]   Respiratory (~20 pathogens) panel by PCR     Status: None   Collection Time: 02/17/23  5:24 PM   Specimen: Nasopharyngeal Swab; Respiratory  Result Value Ref Range Status   Adenovirus NOT DETECTED NOT DETECTED Final   Coronavirus 229E NOT DETECTED NOT DETECTED Final    Comment: (NOTE) The  Coronavirus on the Respiratory Panel, DOES NOT test for the novel  Coronavirus (2019 nCoV)    Coronavirus HKU1 NOT DETECTED NOT DETECTED Final   Coronavirus NL63 NOT DETECTED NOT DETECTED Final   Coronavirus OC43 NOT DETECTED NOT DETECTED Final   Metapneumovirus NOT DETECTED NOT DETECTED Final   Rhinovirus / Enterovirus NOT DETECTED NOT DETECTED Final   Influenza A NOT DETECTED NOT DETECTED Final   Influenza B NOT DETECTED NOT DETECTED Final   Parainfluenza Virus 1 NOT DETECTED NOT DETECTED Final   Parainfluenza Virus 2 NOT DETECTED NOT DETECTED Final   Parainfluenza Virus 3 NOT DETECTED NOT DETECTED Final   Parainfluenza Virus 4 NOT DETECTED NOT DETECTED Final   Respiratory Syncytial Virus NOT DETECTED NOT DETECTED Final   Bordetella pertussis NOT DETECTED NOT DETECTED Final   Bordetella Parapertussis NOT DETECTED NOT DETECTED Final   Chlamydophila pneumoniae NOT DETECTED NOT  DETECTED Final   Mycoplasma pneumoniae NOT DETECTED NOT DETECTED Final    Comment: Performed at [facility], [address] [place], [place] [address]    Labs: CBC: Recent Labs  Lab 02/17/23 0905  WBC 8.5  NEUTROABS 6.8  HGB 16.5  HCT 50.2  MCV 89.5  PLT 191   Basic Metabolic Panel: Recent Labs  Lab 02/17/23 0905  NA 137  K 4.3  CL 106  CO2 24  GLUCOSE 111*  BUN 22*  CREATININE 1.15  CALCIUM 8.9   Liver Function Tests: Recent Labs  Lab 02/17/23 0905  AST 21  ALT 25  ALKPHOS 87  BILITOT 0.7  PROT 6.9  ALBUMIN 3.7   CBG: No results for input(s): "GLUCAP" in the last 168 hours.  Discharge time spent: greater than 30 minutes.  Signed: Lurene Shadow, MD [facility] 02/20/2023

## 2023-02-20 NOTE — Transitions of Care (Post Inpatient/ED Visit) (Signed)
   02/20/2023  Name: [name] MRN: [id number] DOB: May 29, 1966  Today's TOC FU Call Status: Today's TOC FU Call Status:: Unsuccessful Call (1st Attempt) Unsuccessful Call (1st Attempt) Date: 02/20/23  Attempted to reach the patient regarding the most recent Inpatient/ED visit.  Follow Up Plan: Additional outreach attempts will be made to reach the patient to complete the Transitions of Care (Post Inpatient/ED visit) call.   Gean Maidens BSN RN [facility] [phone]

## 2023-02-21 ENCOUNTER — Telehealth: Payer: Self-pay | Admitting: Nurse Practitioner

## 2023-02-21 NOTE — Telephone Encounter (Signed)
Noted. Thanks for the update.  

## 2023-02-21 NOTE — Telephone Encounter (Signed)
Patient contacted the office for an update on how he is feeling. States he saw Susy Frizzle last week, and ended up going to ED on Friday and stayed until Sunday. Says today he is feeling much better, wanted Matt to know he was given prednisone and medication to use in his nebulizer upon discharge. Patient says overall he is feeling much better.

## 2023-02-22 ENCOUNTER — Telehealth: Payer: Self-pay | Admitting: *Deleted

## 2023-02-22 NOTE — Transitions of Care (Post Inpatient/ED Visit) (Signed)
   02/22/2023  Name: ERVING HON MRN: [id number] DOB: [birth year]  Today's TOC FU Call Status: Today's TOC FU Call Status:: Unsuccessful Call (2nd Attempt) Unsuccessful Call (2nd Attempt) Date: 02/22/23  Attempted to reach the patient regarding the most recent Inpatient/ED visit.  Follow Up Plan: Additional outreach attempts will be made to reach the patient to complete the Transitions of Care (Post Inpatient/ED visit) call.   Gean Maidens BSN RN [facility] [phone]

## 2023-02-23 ENCOUNTER — Telehealth: Payer: Self-pay | Admitting: *Deleted

## 2023-02-23 NOTE — Transitions of Care (Post Inpatient/ED Visit) (Signed)
   02/23/2023  Name: QUENTAVIOUS ZOMBEK MRN: [id number] DOB: [birth year]  Today's TOC FU Call Status: Today's TOC FU Call Status:: Unsuccessful Call (3rd Attempt) Unsuccessful Call (3rd Attempt) Date: 02/23/23  Attempted to reach the patient regarding the most recent Inpatient/ED visit.  Follow Up Plan: No further outreach attempts will be made at this time. We have been unable to contact the patient.  Gean Maidens BSN RN [facility] [phone]

## 2023-03-14 ENCOUNTER — Encounter: Payer: Self-pay | Admitting: Cardiology

## 2023-03-14 ENCOUNTER — Ambulatory Visit: Payer: Medicare HMO | Attending: Cardiology | Admitting: Cardiology

## 2023-03-14 VITALS — BP 170/100 | HR 94 | Ht 71.0 in | Wt 252.0 lb

## 2023-03-14 DIAGNOSIS — I25118 Atherosclerotic heart disease of native coronary artery with other forms of angina pectoris: Secondary | ICD-10-CM

## 2023-03-14 DIAGNOSIS — Z720 Tobacco use: Secondary | ICD-10-CM | POA: Diagnosis not present

## 2023-03-14 NOTE — Patient Instructions (Signed)
Medication Instructions:  Please make sure to continue taking Crestor, aspirin and Amlodipine. Continue all other medications as listed. *If you need a refill on your cardiac medications before your next appointment, please call your pharmacy*  Follow-Up: At [facility], you and your health needs are our priority.  As part of our continuing mission to provide you with exceptional heart care, we have created designated Provider Care Teams.  These Care Teams include your primary Cardiologist (physician) and Advanced Practice Providers (APPs -  Physician Assistants and Nurse Practitioners) who all work together to provide you with the care you need, when you need it.  We recommend signing up for the patient portal called "MyChart".  Sign up information is provided on this After Visit Summary.  MyChart is used to connect with patients for Virtual Visits (Telemedicine).  Patients are able to view lab/test results, encounter notes, upcoming appointments, etc.  Non-urgent messages can be sent to your provider as well.   To learn more about what you can do with MyChart, go to [email or web address].    Your next appointment:   6 month(s)  Provider:   Jari Favre, PA-C, Robin Searing, NP, Eligha Bridegroom, NP, or Tereso Newcomer, PA-C

## 2023-03-14 NOTE — Progress Notes (Signed)
Cardiology Office Note:  .   Date:  03/14/2023  ID:  [name], DOB [birth year], MRN [id number] PCP: Eden Emms, NP  [facility] Providers Cardiologist:  Donato Schultz, MD    History of Present Illness: .   [name] is a 57 y.o. male Discussed the use of AI scribe software for clinical note transcription with the patient, who gave verbal consent to proceed.  History of Present Illness   [name], a patient with a history of coronary artery disease, bronchitis, and a chronic foot injury, presents for a follow-up visit. He reports that he has stopped taking most of his medications, including Crestor, due to feeling unwell and lacking energy. He describes feeling "terrible" and having no interest in doing anything when on his previous medication regimen. Since stopping his medications, he reports feeling like a "new person" and has regained his energy and motivation. He continues to take aspirin, amlodipine, and an inhaler.  The patient also discusses his chronic foot pain, which is the result of a fall from a tree in 2013 that crushed his heel. Despite four operations, the pain persists and the only remaining treatment option is amputation, which the patient is reluctant to pursue. He reports managing his pain as best he can and is hopeful that his condition may improve over time.  In addition to his physical health concerns, the patient also discusses his efforts to quit smoking. He is currently using a patch and reports that he is doing "a lot better. ".       ROS: No syncope, no CP.   Studies Reviewed: .        Cardiac Studies & Procedures   CARDIAC CATHETERIZATION  CARDIAC CATHETERIZATION 09/15/2021  Narrative   Prox LAD lesion is 30% stenosed.   Non-stenotic Mid Cx lesion was previously treated.   Non-stenotic Ramus lesion was previously treated.   LV end diastolic pressure is mildly elevated.   The left ventricular ejection fraction is 55-65% by visual  estimate.  1.  Widely patent ramus and left circumflex stents with very mild disease elsewhere. 2.  LVEDP of 14 to 19 mm over the respiratory cycle. 3.  Preserved LV function.  Recommendations: Medical therapy.  Findings Coronary Findings Diagnostic  Dominance: Left  Left Anterior Descending Prox LAD lesion is 30% stenosed.  Ramus Intermedius Non-stenotic Ramus lesion was previously treated.  Left Circumflex Non-stenotic Mid Cx lesion was previously treated.  Right Coronary Artery Vessel is moderate in size.  Intervention  No interventions have been documented.   CARDIAC CATHETERIZATION  CARDIAC CATHETERIZATION 05/03/2021  Narrative   Ramus lesion is 80% stenosed.   Mid Cx lesion is 80% stenosed.   Prox LAD lesion is 50% stenosed.   A drug-eluting stent was successfully placed using a STENT ONYX FRONTIER [id number].   A drug-eluting stent was successfully placed using a STENT ONYX FRONTIER 3.5X15.   Post intervention, there is a 0% residual stenosis.   Post intervention, there is a 0% residual stenosis.   LV end diastolic pressure is mildly elevated.  1.  Moderate proximal LAD lesion with RFR of 0.92. 2.  High-grade mid left circumflex lesion treated with 1 drug-eluting stent. 3.  High-grade ostial ramus lesion treated with 1 drug-eluting stent.  Recommendations: DAPT for at least 1 year and aggressive medical therapy.  Findings Coronary Findings Diagnostic  Dominance: Left  Left Anterior Descending Prox LAD lesion is 50% stenosed. Pressure wire/FFR was performed on the lesion. FFR:  92.  Ramus Intermedius Ramus lesion is 80% stenosed.  Left Circumflex Mid Cx lesion is 80% stenosed.  Intervention  Ramus lesion Stent Lesion length:  15 mm. CATH LAUNCHER 6FR EBU3.5 guide catheter was inserted. Lesion crossed with guidewire using a GUIDEWIRE VAS SION BLUE 190. Pre-stent angioplasty was performed using a BALLN SAPPHIRE 2.5X12. Maximum pressure:  6 atm. A  drug-eluting stent was successfully placed using a STENT ONYX FRONTIER [id number]. Maximum pressure: 12 atm. Inflation time: 12 sec. Post-stent angioplasty was performed using a BALLN SAPPHIRE [place] 3.0X8. Maximum pressure:  12 atm. Post-Intervention Lesion Assessment The intervention was successful. Pre-interventional TIMI flow is 3. Post-intervention TIMI flow is 3. No complications occurred at this lesion. There is a 0% residual stenosis post intervention.  Mid Cx lesion Stent Lesion length:  12 mm. CATH LAUNCHER 6FR EBU3.5 guide catheter was inserted. Lesion crossed with guidewire using a GUIDEWIRE VAS SION BLUE 190. Pre-stent angioplasty was performed using a BALLN SAPPHIRE 2.5X12. Maximum pressure:  6 atm. A drug-eluting stent was successfully placed using a STENT ONYX FRONTIER 3.5X15. Maximum pressure: 14 atm. Inflation time: 30 sec. Post-stent angioplasty was not performed. Post-Intervention Lesion Assessment The intervention was successful. Pre-interventional TIMI flow is 3. Post-intervention TIMI flow is 3. No complications occurred at this lesion. There is a 0% residual stenosis post intervention.   STRESS TESTS  MYOCARDIAL PERFUSION IMAGING 11/24/2022  Narrative   A pharmacological stress test was performed using IV Lexiscan 0.4mg  over 10 seconds performed without concurrent submaximal exercise. The patient reported dyspnea during the stress test. Normal blood pressure and normal heart rate response noted during stress. Heart rate recovery was normal.   No ST deviation was noted. Arrhythmias during stress: rare PACs. ECG was interpretable and without significant changes. The ECG was negative for ischemia. The ECG was not diagnostic due to pharmacologic protocol.   Diaphragmatic attenuation artifact was present.   LV perfusion is normal. There is no evidence of ischemia. There is no evidence of infarction.   Left ventricular function is normal. Nuclear stress EF: 62 %. End diastolic cavity  size is normal. End systolic cavity size is normal. No evidence of transient ischemic dilation (TID) noted.   The study is normal. The study is low risk.   ECHOCARDIOGRAM  ECHOCARDIOGRAM COMPLETE 12/14/2022  Narrative ECHOCARDIOGRAM REPORT    Patient Name:   [name]  Date of Exam: 12/14/2022 Medical Rec #:  [id number]     Height:       70.0 in Accession #:    [id number]    Weight:       236.2 lb Date of Birth:  06/13/1966      BSA:          2.240 m Patient Age:    57 years      BP:           122/68 mmHg Patient Gender: M             HR:           72 bpm. Exam Location:  [address]  Procedure: 2D Echo, 3D Echo, Cardiac Doppler, Color Doppler and Strain Analysis  Indications:    R06.00 Dyspnea  History:        Patient has prior history of Echocardiogram examinations, most recent 09/07/2021. Risk Factors:Hypertension. Obstructive sleep apnea-CPAP. Chronic kidney disease.  Sonographer:    Daphine Deutscher RDCS Referring Phys: [id number] [facility] [place]  IMPRESSIONS   1. Left ventricular ejection fraction, by estimation, is  60 to 65%. The left ventricle has normal function. The left ventricle has no regional wall motion abnormalities. Left ventricular diastolic parameters were normal. The average left ventricular global longitudinal strain is -22.5 %. The global longitudinal strain is normal. 2. Right ventricular systolic function is normal. The right ventricular size is normal. Tricuspid regurgitation signal is inadequate for assessing PA pressure. 3. The mitral valve is normal in structure. No evidence of mitral valve regurgitation. No evidence of mitral stenosis. 4. The aortic valve is tricuspid. There is mild calcification of the aortic valve. Aortic valve regurgitation is trivial. No aortic stenosis is present. 5. Aortic dilatation noted. There is mild dilatation of the ascending aorta, measuring 39 mm. 6. The inferior vena cava is normal in size with <50% respiratory  variability, suggesting right atrial pressure of 8 mmHg.  FINDINGS Left Ventricle: Left ventricular ejection fraction, by estimation, is 60 to 65%. The left ventricle has normal function. The left ventricle has no regional wall motion abnormalities. The average left ventricular global longitudinal strain is -22.5 %. The global longitudinal strain is normal. The left ventricular internal cavity size was normal in size. There is no left ventricular hypertrophy. Left ventricular diastolic parameters were normal.  Right Ventricle: The right ventricular size is normal. No increase in right ventricular wall thickness. Right ventricular systolic function is normal. Tricuspid regurgitation signal is inadequate for assessing PA pressure.  Left Atrium: Left atrial size was normal in size.  Right Atrium: Right atrial size was normal in size.  Pericardium: There is no evidence of pericardial effusion.  Mitral Valve: The mitral valve is normal in structure. No evidence of mitral valve regurgitation. No evidence of mitral valve stenosis.  Tricuspid Valve: The tricuspid valve is normal in structure. Tricuspid valve regurgitation is not demonstrated.  Aortic Valve: The aortic valve is tricuspid. There is mild calcification of the aortic valve. Aortic valve regurgitation is trivial. No aortic stenosis is present.  Pulmonic Valve: The pulmonic valve was normal in structure. Pulmonic valve regurgitation is not visualized.  Aorta: Aortic dilatation noted. There is mild dilatation of the ascending aorta, measuring 39 mm.  Venous: The inferior vena cava is normal in size with less than 50% respiratory variability, suggesting right atrial pressure of 8 mmHg.  IAS/Shunts: No atrial level shunt detected by color flow Doppler.   LEFT VENTRICLE PLAX 2D LVIDd:         4.80 cm   Diastology LVIDs:         3.30 cm   LV e' medial:    8.32 cm/s LV PW:         1.00 cm   LV E/e' medial:  11.6 LV IVS:        1.00 cm    LV e' lateral:   10.32 cm/s LVOT diam:     2.10 cm   LV E/e' lateral: 9.3 LV SV:         77 LV SV Index:   34        2D Longitudinal Strain LVOT Area:     3.46 cm  2D Strain GLS (A2C):   -22.4 % 2D Strain GLS (A3C):   -23.2 % 2D Strain GLS (A4C):   -21.9 % 2D Strain GLS Avg:     -22.5 %  3D Volume EF: 3D EF:        55 % LV EDV:       134 ml LV ESV:       60 ml LV SV:  73 ml  RIGHT VENTRICLE             IVC RV Basal diam:  4.20 cm     IVC diam: 1.90 cm RV S prime:     18.50 cm/s TAPSE (M-mode): 2.5 cm  LEFT ATRIUM             Index        RIGHT ATRIUM           Index LA diam:        4.10 cm 1.83 cm/m   RA Area:     14.50 cm LA Vol (A2C):   49.9 ml 22.27 ml/m  RA Volume:   38.10 ml  17.01 ml/m LA Vol (A4C):   63.7 ml 28.43 ml/m LA Biplane Vol: 57.6 ml 25.71 ml/m AORTIC VALVE LVOT Vmax:   100.00 cm/s LVOT Vmean:  66.000 cm/s LVOT VTI:    0.223 m  AORTA Ao Root diam: 3.60 cm Ao Asc diam:  3.90 cm  MITRAL VALVE MV Area (PHT): 4.10 cm    SHUNTS MV Decel Time: 185 msec    Systemic VTI:  0.22 m MV E velocity: 96.20 cm/s  Systemic Diam: 2.10 cm MV A velocity: 83.85 cm/s MV E/A ratio:  1.15  [name] McleanMD Electronically signed by Wilfred Lacy Signature Date/Time: 12/14/2022/11:09:12 AM    Final             Risk Assessment/Calculations:           Physical Exam:   VS:  BP (!) 170/100   Pulse 94   Ht 5\' 11"  (1.803 m)   Wt 252 lb (114.3 kg)   SpO2 96%   BMI 35.15 kg/m    Wt Readings from Last 3 Encounters:  03/14/23 252 lb (114.3 kg)  02/17/23 255 lb (115.7 kg)  02/16/23 250 lb (113.4 kg)    GEN: Well nourished, well developed in no acute distress NECK: No JVD; No carotid bruits CARDIAC: RRR, no murmurs, rubs, gallops RESPIRATORY:  Clear to auscultation without rales, wheezing or rhonchi  ABDOMEN: Soft, non-tender, non-distended EXTREMITIES:  No edema; No deformity   ASSESSMENT AND PLAN: .    Assessment and Plan    Coronary Artery  Disease Patient stopped taking most medications due to feeling unwell and lethargic. Stents placed in 2022 remain open and functioning. Patient is currently taking aspirin and amlodipine. Discussed the importance of statin therapy for plaque stabilization and prevention of further cardiac events. -Resume Rosuvastatin (Crestor) 20mg  daily.  Chronic Bronchitis Recent hospitalization for exacerbation. Currently using Albuterol inhaler and nebulizer as needed. -Continue current management and monitor symptoms.  Left Foot Injury Chronic pain due to previous injury and subsequent surgeries. Patient is managing pain and avoiding amputation for as long as possible. -No changes to current management.  Smoking Patient is currently using nicotine patches in an attempt to quit smoking. -Encourage continued efforts to quit smoking.  Follow-up Patient to report back on how he feels after resuming Rosuvastatin.             Signed, Donato Schultz, MD

## 2023-03-20 NOTE — Progress Notes (Addendum)
03/21/23- 57 yoM Smoker for sleep evaluation with concern of dyspnea, courtesy of Dr Toney Reil Medical problem list includes- CAD/ MI/stents, HTN, COPD, GERD, CKD3a, Depression, Cocaine abuse, ETOH, Tobacco, nodule,  Hosp 8/2-8/4-  COPD exacerbation HST 08/28/13 AHI 7.7/hr, desat to 85%, body weight 220 lbs -Ventolin hfa, Trelegy 100, Neb Duoneb,  Nicoderm Body weight today- I had seen him for OSA in 2015> ordered CPAP/APS and also referred to ENT to improve nasal airway. He complains of waking every hour and a half, mainly due to nocturia and says this is interfering with comfortable CPAP use.  We discussed compliance goals and comfort issues.  I suggested he may want to talk with his primary provider about seeing a urologist. Short of breath especially in the last 6 months with no single acute event.  He feels better than when in the hospital in August.  Has nicotine patches but has not been using them.  Continues to smoke and we discussed importance of smoking cessation, impact on breathing.  We will schedule PFT, ONOX, we will also seek to replace his old CPAP machine.  He feels difficulty taking deep breath and weak discussed updating CXR to check this.  Addendum Overnight Oximetry Result 03/26/23- Frequent interruptions, but at least 36 minutes on room air (not CPAP) with O2 sat </= 88%.  CXR 02/17/23- IMPRESSION: 1. Chronic interstitial lung disease. 2. Superimposed heterogeneous opacities overlying the left lower lung zone concerning for pneumonitis. Follow-up to clearing is suggested.  CT 02/07/23- IMPRESSION: 1. Mild bronchial wall thickening, findings can be seen in the setting of bronchitis. 2. Ill-defined centrilobular ground-glass nodules primarily in the upper lungs, findings are similar to prior exam and compatible with smoking related respiratory bronchiolitis. 3. New 3 mm nodule of the right lower lobe. No follow-up needed if patient is low-risk. Non-contrast chest CT can be  considered in 12 months if patient is high-risk. This recommendation follows the consensus statement: Guidelines for Management of Incidental Pulmonary Nodules Detected on CT Images: From the Fleischner Society 2017; Radiology 2017; 284:228-243. 4. Aortic Atherosclerosis (ICD10-I70.0) and Emphysema (ICD10-J43.9).  ROS-see HPI   Negative unless "+" Constitutional:    weight loss, night sweats, fevers, chills, fatigue, lassitude. HEENT:    headaches, difficulty swallowing, tooth/dental problems, sore throat,       sneezing, itching, ear ache, nasal congestion, post nasal drip, snoring CV:    chest pain, orthopnea, PND, swelling in lower extremities, anasarca,                     dizziness, palpitations Resp:   +shortness of breath with exertion or at rest.                productive cough,   non-productive cough, coughing up of blood.              change in color of mucus.  wheezing.   Skin:    rash or lesions. GI:  No-   heartburn, indigestion, abdominal pain, nausea, vomiting, diarrhea,                 change in bowel habits, loss of appetite GU: dysuria, change in color of urine, no urgency or frequency.   flank pain. MS:   joint pain, stiffness, decreased range of motion, back pain. Neuro-     nothing unusual Psych:  change in mood or affect.  depression or anxiety.   memory loss.  OBJ- Physical Exam General- Alert, Oriented, Affect-appropriate, Distress- none  acute Skin- rash-none, lesions- none, excoriation- none Lymphadenopathy- none Head- atraumatic            Eyes- Gross vision intact, PERRLA, conjunctivae and secretions clear            Ears- Hearing, canals-normal            Nose- Clear, no-Septal dev, mucus, polyps, erosion, perforation             Throat- Mallampati II , mucosa clear , drainage- none, tonsils- atrophic Neck- flexible , trachea midline, no stridor , thyroid nl, carotid no bruit Chest - symmetrical excursion , unlabored           Heart/CV- RRR , no murmur ,  no gallop  , no rub, nl s1 s2                           - JVD- none , edema+bilateral sock-line edema, stasis changes- none, varices- none           Lung- clear to P&A, wheeze- none, cough- none , dullness-none, rub- none           Chest wall-  Abd-  Br/ Gen/ Rectal- Not done, not indicated Extrem- cyanosis- none, clubbing, none, atrophy- none, strength- nl Neuro- grossly intact to observation

## 2023-03-21 ENCOUNTER — Encounter: Payer: Self-pay | Admitting: Internal Medicine

## 2023-03-21 ENCOUNTER — Ambulatory Visit (INDEPENDENT_AMBULATORY_CARE_PROVIDER_SITE_OTHER): Payer: Medicare HMO

## 2023-03-21 ENCOUNTER — Ambulatory Visit (INDEPENDENT_AMBULATORY_CARE_PROVIDER_SITE_OTHER): Payer: Medicare HMO | Admitting: Internal Medicine

## 2023-03-21 VITALS — BP 140/84 | HR 96 | Temp 97.5°F | Ht 70.0 in | Wt 253.4 lb

## 2023-03-21 DIAGNOSIS — J441 Chronic obstructive pulmonary disease with (acute) exacerbation: Secondary | ICD-10-CM

## 2023-03-21 DIAGNOSIS — I25119 Atherosclerotic heart disease of native coronary artery with unspecified angina pectoris: Secondary | ICD-10-CM

## 2023-03-21 DIAGNOSIS — G4734 Idiopathic sleep related nonobstructive alveolar hypoventilation: Secondary | ICD-10-CM

## 2023-03-21 DIAGNOSIS — J449 Chronic obstructive pulmonary disease, unspecified: Secondary | ICD-10-CM | POA: Diagnosis not present

## 2023-03-21 DIAGNOSIS — Z720 Tobacco use: Secondary | ICD-10-CM | POA: Diagnosis not present

## 2023-03-21 DIAGNOSIS — G4733 Obstructive sleep apnea (adult) (pediatric): Secondary | ICD-10-CM | POA: Diagnosis not present

## 2023-03-21 DIAGNOSIS — E669 Obesity, unspecified: Secondary | ICD-10-CM

## 2023-03-21 NOTE — Patient Instructions (Signed)
Order- DME [facility]- please replace old CPAP machine, auto 5-20, refit mask of choice, humidifier, supplies, airView/ card  Order- schedule PFT   dx COPD exacerbation  Order- CXR   dx COPD exacerbation  Order- sample x 2 Breztri inhaler   inhale 2 puffs, then rinse mouth, Twice daily  Try this instead of Trelegy. When you run out of the samples, go back to Trelegy.  Order- Schedule overnight oximetry on CPAP   dx Nocturnal Hypoxemia  Do every thing you can to cut down your smoking

## 2023-03-22 ENCOUNTER — Encounter: Payer: Self-pay | Admitting: Internal Medicine

## 2023-03-22 NOTE — Assessment & Plan Note (Signed)
Emphasis on smoking cessation.  Support offered.

## 2023-03-22 NOTE — Assessment & Plan Note (Signed)
He has benefited from CPAP but we need to emphasize increased use and comfort. Plan-replace old machine, continue auto 5-20

## 2023-03-22 NOTE — Assessment & Plan Note (Signed)
Continue cardiology follow-up

## 2023-03-22 NOTE — Assessment & Plan Note (Addendum)
Emphasis on smoking cessation Plan-PFT, CXR, overnight oximetry

## 2023-03-22 NOTE — Assessment & Plan Note (Signed)
Weight loss would help and is encouraged.

## 2023-03-30 DIAGNOSIS — J449 Chronic obstructive pulmonary disease, unspecified: Secondary | ICD-10-CM | POA: Diagnosis not present

## 2023-04-19 DIAGNOSIS — S92012K Displaced fracture of body of left calcaneus, subsequent encounter for fracture with nonunion: Secondary | ICD-10-CM | POA: Diagnosis not present

## 2023-04-19 DIAGNOSIS — T8484XA Pain due to internal orthopedic prosthetic devices, implants and grafts, initial encounter: Secondary | ICD-10-CM | POA: Diagnosis not present

## 2023-04-19 DIAGNOSIS — M25572 Pain in left ankle and joints of left foot: Secondary | ICD-10-CM | POA: Diagnosis not present

## 2023-06-08 ENCOUNTER — Ambulatory Visit (HOSPITAL_COMMUNITY): Admit: 2023-06-08 | Payer: Medicare HMO | Admitting: Orthopedic Surgery

## 2023-06-08 SURGERY — REMOVAL, HARDWARE
Anesthesia: Choice | Laterality: Left

## 2023-06-13 ENCOUNTER — Other Ambulatory Visit: Payer: Self-pay

## 2023-06-13 ENCOUNTER — Emergency Department: Payer: Medicare HMO

## 2023-06-13 ENCOUNTER — Emergency Department
Admission: EM | Admit: 2023-06-13 | Discharge: 2023-06-13 | Disposition: A | Discharge status: Home / Self Care | Payer: Medicare HMO | Attending: Emergency Medicine | Admitting: Emergency Medicine

## 2023-06-13 ENCOUNTER — Encounter: Payer: Self-pay | Admitting: Emergency Medicine

## 2023-06-13 DIAGNOSIS — J449 Chronic obstructive pulmonary disease, unspecified: Secondary | ICD-10-CM | POA: Diagnosis not present

## 2023-06-13 DIAGNOSIS — F17200 Nicotine dependence, unspecified, uncomplicated: Secondary | ICD-10-CM | POA: Diagnosis not present

## 2023-06-13 DIAGNOSIS — J441 Chronic obstructive pulmonary disease with (acute) exacerbation: Secondary | ICD-10-CM | POA: Insufficient documentation

## 2023-06-13 DIAGNOSIS — R0602 Shortness of breath: Secondary | ICD-10-CM | POA: Diagnosis not present

## 2023-06-13 DIAGNOSIS — R918 Other nonspecific abnormal finding of lung field: Secondary | ICD-10-CM | POA: Diagnosis not present

## 2023-06-13 LAB — COMPREHENSIVE METABOLIC PANEL
ALT: 26 U/L (ref 0–44)
AST: 20 U/L (ref 15–41)
Albumin: 4 g/dL (ref 3.5–5.0)
Alkaline Phosphatase: 100 U/L (ref 38–126)
Anion gap: 4 — ABNORMAL LOW (ref 5–15)
BUN: 18 mg/dL (ref 6–20)
CO2: 27 mmol/L (ref 22–32)
Calcium: 8.9 mg/dL (ref 8.9–10.3)
Chloride: 106 mmol/L (ref 98–111)
Creatinine, Ser: 0.85 mg/dL (ref 0.61–1.24)
GFR, Estimated: >60 mL/min (ref >60)
Glucose, Bld: 119 mg/dL — ABNORMAL HIGH (ref 70–99)
Potassium: 4.2 mmol/L (ref 3.5–5.1)
Sodium: 137 mmol/L (ref 135–145)
Total Bilirubin: 0.5 mg/dL (ref <1.2)
Total Protein: 6.9 g/dL (ref 6.5–8.1)

## 2023-06-13 LAB — CBC WITH DIFFERENTIAL/PLATELET
Abs Immature Granulocytes: 0.01 10*3/uL (ref 0.00–0.07)
Basophils Absolute: 0 10*3/uL (ref 0.0–0.1)
Basophils Relative: 0 %
Eosinophils Absolute: 0.5 10*3/uL (ref 0.0–0.5)
Eosinophils Relative: 8 %
HCT: 47.7 % (ref 39.0–52.0)
Hemoglobin: 16 g/dL (ref 13.0–17.0)
Immature Granulocytes: 0 %
Lymphocytes Relative: 28 %
Lymphs Abs: 1.6 10*3/uL (ref 0.7–4.0)
MCH: 29.7 pg (ref 26.0–34.0)
MCHC: 33.5 g/dL (ref 30.0–36.0)
MCV: 88.5 fL (ref 80.0–100.0)
Monocytes Absolute: 0.4 10*3/uL (ref 0.1–1.0)
Monocytes Relative: 8 %
Neutro Abs: 3.1 10*3/uL (ref 1.7–7.7)
Neutrophils Relative %: 56 %
Platelets: 193 10*3/uL (ref 150–400)
RBC: 5.39 MIL/uL (ref 4.22–5.81)
RDW: 13.2 % (ref 11.5–15.5)
WBC: 5.6 10*3/uL (ref 4.0–10.5)
nRBC: 0 % (ref 0.0–0.2)

## 2023-06-13 LAB — BRAIN NATRIURETIC PEPTIDE: B Natriuretic Peptide: 32.1 pg/mL (ref 0.0–100.0)

## 2023-06-13 MED ORDER — METHYLPREDNISOLONE SODIUM SUCC 125 MG IJ SOLR
125.0000 mg | Freq: Once | INTRAMUSCULAR | Status: AC
  Administered 2023-06-13: 125 mg via INTRAVENOUS
  Filled 2023-06-13: qty 2

## 2023-06-13 MED ORDER — AZITHROMYCIN 500 MG PO TABS
500.0000 mg | ORAL_TABLET | Freq: Once | ORAL | Status: AC
  Administered 2023-06-13: 500 mg via ORAL
  Filled 2023-06-13: qty 1

## 2023-06-13 MED ORDER — ALBUTEROL SULFATE HFA 108 (90 BASE) MCG/ACT IN AERS: 2.0000 | INHALATION_SPRAY | Freq: Four times a day (QID) | RESPIRATORY_TRACT | 2 refills | Status: DC | PRN

## 2023-06-13 MED ORDER — AZITHROMYCIN 250 MG PO TABS: ORAL_TABLET | ORAL | 0 refills | Status: AC

## 2023-06-13 MED ORDER — IPRATROPIUM-ALBUTEROL 0.5-2.5 (3) MG/3ML IN SOLN
6.0000 mL | Freq: Once | RESPIRATORY_TRACT | Status: AC
  Administered 2023-06-13: 6 mL via RESPIRATORY_TRACT
  Filled 2023-06-13: qty 6

## 2023-06-13 MED ORDER — FLUTICASONE-SALMETEROL 113-14 MCG/ACT IN AEPB: 1.0000 | INHALATION_SPRAY | Freq: Two times a day (BID) | RESPIRATORY_TRACT | 2 refills | Status: DC

## 2023-06-13 MED ORDER — PREDNISONE 10 MG (21) PO TBPK: ORAL_TABLET | ORAL | 0 refills | Status: DC

## 2023-06-13 NOTE — ED Provider Notes (Signed)
[facility] Provider Note   Event Date/Time   First MD Initiated Contact with Patient 06/13/23 1610     (approximate) History  Shortness of Breath  HPI [name] is a 57 y.o. male with a stated past medical history of COPD and continued tobacco abuse who presents complaining of shortness of breath with associated wheezing after positive sick contacts at home.  Patient states that he is been using his nebulizer machine at home with no relief.  Denies fevers ROS: Patient currently denies any vision changes, tinnitus, difficulty speaking, facial droop, sore throat, chest pain, abdominal pain, nausea/vomiting/diarrhea, dysuria, or weakness/numbness/paresthesias in any extremity   Physical Exam  Triage Vital Signs: ED Triage Vitals  Encounter Vitals Group     BP 06/13/23 1608 (!) 150/85     Systolic BP Percentile --      Diastolic BP Percentile --      Pulse Rate 06/13/23 1608 93     Resp 06/13/23 1608 (!) 28     Temp 06/13/23 1608 97.7 F (36.5 C)     Temp Source 06/13/23 1608 Oral     SpO2 06/13/23 1608 93 %     Weight 06/13/23 1607 251 lb 5.2 oz (114 kg)     Height 06/13/23 1607 5\' 10"  (1.778 m)     Head Circumference --      Peak Flow --      Pain Score 06/13/23 1607 0     Pain Loc --      Pain Education --      Exclude from Growth Chart --    Most recent vital signs: Vitals:   06/13/23 1840 06/13/23 1845  BP:    Pulse: 83 98  Resp: (!) 25 (!) 24  Temp:    SpO2: 93% 90%   General: Awake, oriented x4. CV:  Good peripheral perfusion.  Resp:  Increased effort.  Inspiratory and expiratory wheezing over bilateral lung fields Abd:  No distention.  Other:  Middle-aged obese Caucasian male resting comfortably in mild respiratory distress ED Results / Procedures / Treatments  Labs (all labs ordered are listed, but only abnormal results are displayed) Labs Reviewed  COMPREHENSIVE METABOLIC PANEL - Abnormal; Notable for the following components:       Result Value   Glucose, Bld 119 (*)    Anion gap 4 (*)    All other components within normal limits  CBC WITH DIFFERENTIAL/PLATELET  BRAIN NATRIURETIC PEPTIDE   EKG ED ECG REPORT I, Merwyn Katos, the attending physician, personally viewed and interpreted this ECG. Date: 06/13/2023 EKG Time: 1607 Rate: 98 Rhythm: normal sinus rhythm QRS Axis: normal Intervals: normal ST/T Wave abnormalities: normal Narrative Interpretation: Normal sinus rhythm with PVCs.  No evidence of acute ischemia RADIOLOGY ED MD interpretation: Chest x-ray shows left lower lung field atelectasis versus developing infiltrate -Agree with radiology assessment Official radiology report(s): DG Chest Port 1 View  Result Date: 06/13/2023 CLINICAL DATA:  Shortness of breath.  COPD. EXAM: PORTABLE CHEST 1 VIEW COMPARISON:  Chest radiograph dated 03/21/2023 FINDINGS: Faint reticular densities in the left lower lung field may represent atelectasis or developing infiltrate. No pleural effusion or pneumothorax. The cardiac silhouette is within normal limits. No acute osseous pathology. IMPRESSION: Left lower lung field atelectasis versus developing infiltrate. Electronically Signed   By: Elgie Collard M.D.   On: 06/13/2023 17:35   PROCEDURES: Critical Care performed: No .1-3 Lead EKG Interpretation  Performed by: Merwyn Katos, MD Authorized  by: Merwyn Katos, MD     Interpretation: normal     ECG rate:  71   ECG rate assessment: normal     Rhythm: sinus rhythm     Ectopy: none     Conduction: normal    MEDICATIONS ORDERED IN ED: Medications  ipratropium-albuterol (DUONEB) 0.5-2.5 (3) MG/3ML nebulizer solution 6 mL (6 mLs Nebulization Given 06/13/23 1639)  methylPREDNISolone sodium succinate (SOLU-MEDROL) 125 mg/2 mL injection 125 mg (125 mg Intravenous Given 06/13/23 1648)  azithromycin (ZITHROMAX) tablet 500 mg (500 mg Oral Given 06/13/23 1751)   IMPRESSION / MDM / ASSESSMENT AND PLAN / ED COURSE  I  reviewed the triage vital signs and the nursing notes.                             The patient is on the cardiac monitor to evaluate for evidence of arrhythmia and/or significant heart rate changes. Patient's presentation is most consistent with acute presentation with potential threat to life or bodily function. The patient appears to be suffering from a moderate exacerbation of COPD.  Based on the history, exam, CXR/EKG, and further workup I don't suspect any other emergent cause of this presentation, such as pneumonia, acute coronary syndrome, congestive heart failure, pulmonary embolism, or pneumothorax.  ED Interventions: bronchodilators, steroids, antibiotics, reassess  Reassessment: After treatment, the patient's shortness of breath is resolved, and their lung exam has returned to baseline. They are comfortable and want to go home.  Rx: Steroids, Antibiotics, Albuterol Disposition: Discharge home with SRP. PCP follow up recommended in next 48hours.   FINAL CLINICAL IMPRESSION(S) / ED DIAGNOSES   Final diagnoses:  COPD exacerbation (HCC)   Rx / DC Orders   ED Discharge Orders          Ordered    predniSONE (STERAPRED UNI-PAK 21 TAB) 10 MG (21) TBPK tablet        06/13/23 1849    albuterol (VENTOLIN HFA) 108 (90 Base) MCG/ACT inhaler  Every 6 hours PRN        06/13/23 1849    azithromycin (ZITHROMAX Z-PAK) 250 MG tablet        06/13/23 1849    Fluticasone-Salmeterol 113-14 MCG/ACT AEPB  2 times daily        06/13/23 1849           Note:  This document was prepared using Dragon voice recognition software and may include unintentional dictation errors.   Merwyn Katos, MD 06/13/23 [phone]

## 2023-06-13 NOTE — ED Triage Notes (Signed)
Patient to ED via POV for SOB x2 days. Hx of COPD. Pt has been using nebs at home but not helping. Patient working to breath and unable to complete full sentences.

## 2023-06-13 NOTE — ED Notes (Signed)
States, "Feeling better, can get a full breath, 91-96% on RA.

## 2023-06-18 NOTE — Progress Notes (Signed)
HPI-     03/21/23- 57 yoM Smoker for sleep evaluation with concern of dyspnea, courtesy of Dr Toney Reil Medical problem list includes- CAD/ MI/stents, HTN, COPD, GERD, CKD3a, Depression, Cocaine abuse, ETOH, Tobacco, nodule,  Hosp 8/2-8/4-  COPD exacerbation HST 08/28/13 AHI 7.7/hr, desat to 85%, body weight 220 lbs -Ventolin hfa, Trelegy 100, Neb Duoneb,  Nicoderm Body weight today- I had seen him for OSA in 2015> ordered CPAP/APS and also referred to ENT to improve nasal airway. He complains of waking every hour and a half, mainly due to nocturia and says this is interfering with comfortable CPAP use.  We discussed compliance goals and comfort issues.  I suggested he may want to talk with his primary provider about seeing a urologist. Short of breath especially in the last 6 months with no single acute event.  He feels better than when in the hospital in August.  Has nicotine patches but has not been using them.  Continues to smoke and we discussed importance of smoking cessation, impact on breathing.  We will schedule PFT, ONOX, we will also seek to replace his old CPAP machine.  He feels difficulty taking deep breath and weak discussed updating CXR to check this.  Addendum Overnight Oximetry Result 03/26/23- Frequent interruptions, but at least 36 minutes on room air (not CPAP) with O2 sat </= 88%.  CXR 02/17/23- IMPRESSION: 1. Chronic interstitial lung disease. 2. Superimposed heterogeneous opacities overlying the left lower lung zone concerning for pneumonitis. Follow-up to clearing is suggested.  CT 02/07/23- IMPRESSION: 1. Mild bronchial wall thickening, findings can be seen in the setting of bronchitis. 2. Ill-defined centrilobular ground-glass nodules primarily in the upper lungs, findings are similar to prior exam and compatible with smoking related respiratory bronchiolitis. 3. New 3 mm nodule of the right lower lobe. No follow-up needed if patient is low-risk. Non-contrast  chest CT can be considered in 12 months if patient is high-risk. This recommendation follows the consensus statement: Guidelines for Management of Incidental Pulmonary Nodules Detected on CT Images: From the Fleischner Society 2017; Radiology 2017; 284:228-243. 4. Aortic Atherosclerosis (ICD10-I70.0) and Emphysema (ICD10-J43.9).  06/20/23- 57 yoM Smoker followed for OSA, COPD, Noct Hypoxemia, Lung Nodules Tobacco Abuse, complicated by CAD/ MI/stents, HTN,  GERD, CKD3a, Depression, Cocaine abuse, ETOH, Obesity,  Hosp 8/2-8/4-  COPD exacerbation HST 08/28/13 AHI 7.7/hr, desat to 85%, body weight 220 lbs -Ventolin hfa, Trelegy 100/ gen Symbicort, Neb Duoneb,  Nicoderm, [place],  Body weight today- ED 06/13/23- COPD exacerbation. Prev hosp 02/17/23- COPD exacerb. CPAP auto 5-20/         replacement ordered 03/21/23 -----C/o SOB and cough persistent today.  Went to urgent care and sent to [facility] ED.06/13/2023. Download compliance- Not using CPAP in 6 months. Very mild OSA. Discussed weight and sleep position. Willing to go for mask fitting at sleep center. Now done with Zpak and prednisone after ED visit. Using nebulizer several times/ day routinely. Plan f/u CT for RLL nodule. Will update CXR for interval check after ED visit. CXR 06/13/23- 1V IMPRESSION: Left lower lung field atelectasis versus developing infiltrate.  ROS-see HPI   + = positive Constitutional:    weight loss, night sweats, fevers, chills, fatigue, lassitude. HEENT:    headaches, difficulty swallowing, tooth/dental problems, sore throat,       sneezing, itching, ear ache, nasal congestion, post nasal drip, snoring CV:    chest pain, orthopnea, PND, swelling in lower extremities, anasarca,  dizziness, palpitations Resp:   +shortness of breath with exertion or at rest.                productive cough,   non-productive cough, coughing up of blood.              change in color of mucus.  wheezing.   Skin:    rash  or lesions. GI:  No-   heartburn, indigestion, abdominal pain, nausea, vomiting, diarrhea,                 change in bowel habits, loss of appetite GU: dysuria, change in color of urine, no urgency or frequency.   flank pain. MS:   joint pain, stiffness, decreased range of motion, back pain. Neuro-     nothing unusual Psych:  change in mood or affect.  depression or anxiety.   memory loss.  OBJ- Physical Exam General- Alert, Oriented, Affect-appropriate, Distress- none acute, +overweight Skin- rash-none, lesions- none, excoriation- none Lymphadenopathy- none Head- atraumatic            Eyes- Gross vision intact, PERRLA, conjunctivae and secretions clear            Ears- Hearing, canals-normal            Nose- Clear, no-Septal dev, mucus, polyps, erosion, perforation             Throat- Mallampati II , mucosa clear , drainage- none, tonsils- atrophic Neck- flexible , trachea midline, no stridor , thyroid nl, carotid no bruit Chest - symmetrical excursion , unlabored           Heart/CV- RRR , no murmur , no gallop  , no rub, nl s1 s2                           - JVD- none , edema+bilateral sock-line edema, stasis changes- none, varices- none           Lung- clear to P&A, wheeze+, cough- none , dullness-none, rub- none           Chest wall-  Abd-  Br/ Gen/ Rectal- Not done, not indicated Extrem- cyanosis- none, clubbing, none, atrophy- none, strength- nl Neuro- grossly intact to observation

## 2023-06-20 ENCOUNTER — Encounter: Payer: Self-pay | Admitting: Internal Medicine

## 2023-06-20 ENCOUNTER — Ambulatory Visit: Payer: Medicare HMO | Admitting: Internal Medicine

## 2023-06-20 ENCOUNTER — Ambulatory Visit: Payer: Medicare HMO

## 2023-06-20 VITALS — BP 150/76 | HR 115 | Temp 97.5°F | Ht 70.0 in | Wt 266.0 lb

## 2023-06-20 DIAGNOSIS — J441 Chronic obstructive pulmonary disease with (acute) exacerbation: Secondary | ICD-10-CM | POA: Diagnosis not present

## 2023-06-20 DIAGNOSIS — G4733 Obstructive sleep apnea (adult) (pediatric): Secondary | ICD-10-CM

## 2023-06-20 MED ORDER — BUDESONIDE-FORMOTEROL FUMARATE 160-4.5 MCG/ACT IN AERO: INHALATION_SPRAY | RESPIRATORY_TRACT | 12 refills | Status: DC

## 2023-06-20 MED ORDER — SPIRIVA RESPIMAT 2.5 MCG/ACT IN AERS: 2.0000 | INHALATION_SPRAY | Freq: Every day | RESPIRATORY_TRACT | Status: DC

## 2023-06-20 NOTE — Patient Instructions (Addendum)
Order- sample x 2 Spiriva 2.5   inhale 2 puffs daily  Script sent refilling Symbicort (generic ok)    inhale 2 puffs then rinse mouth, twice daily  Ok to keep using your nebulizer machine with Duoneb  Order- CXR   dx COPD exacerbation  Order- [facility]    dx OSA     Get back to using CPAP after this.

## 2023-06-28 ENCOUNTER — Telehealth: Payer: Self-pay | Admitting: Internal Medicine

## 2023-06-28 NOTE — Telephone Encounter (Signed)
Order has been signed in [place].

## 2023-06-28 NOTE — Telephone Encounter (Signed)
I spoke with [name] with [employer] and they have been waiting for DWO to be signed in [place]

## 2023-06-28 NOTE — Telephone Encounter (Signed)
PT still does not have CPAP. [employer] states something needs to be signed they say. Pls call to have released.   Phone: [phone]

## 2023-07-05 ENCOUNTER — Other Ambulatory Visit (HOSPITAL_BASED_OUTPATIENT_CLINIC_OR_DEPARTMENT_OTHER): Payer: Medicare HMO

## 2023-07-07 DIAGNOSIS — G4733 Obstructive sleep apnea (adult) (pediatric): Secondary | ICD-10-CM | POA: Diagnosis not present

## 2023-07-18 ENCOUNTER — Telehealth: Payer: Self-pay | Admitting: Internal Medicine

## 2023-07-18 NOTE — Telephone Encounter (Signed)
Patient needs samples of Spiriva 2.11mcg and a prescription if possible. It has been working well for him.  Pharmacy: [facility] on [address]

## 2023-07-20 ENCOUNTER — Other Ambulatory Visit: Payer: Self-pay | Admitting: Internal Medicine

## 2023-07-21 MED ORDER — SPIRIVA RESPIMAT 2.5 MCG/ACT IN AERS: INHALATION_SPRAY | RESPIRATORY_TRACT | 12 refills | Status: DC

## 2023-07-21 NOTE — Telephone Encounter (Signed)
 Forwarding to Dr Maple Hudson for approval.

## 2023-07-21 NOTE — Telephone Encounter (Signed)
 Ok sample x 2 Spiriva respimat 2.5,  inhale 2 puffs daily I am sending script to his pharmacy.

## 2023-07-21 NOTE — Telephone Encounter (Signed)
 Called patient,  gave information.  Samples of Spiriva 2.5 x 2 left at front desk.

## 2023-07-21 NOTE — Telephone Encounter (Signed)
 Patient checking on message for Spiriva. Has 2 days left. Patient phone number is [phone].

## 2023-07-24 NOTE — Telephone Encounter (Signed)
 Patient states insurance will not cover Spiriva  2.5 MCG Respimat.  Patient states his pharmacy gave him a list of the formulary alternatives.  Patient does not have the list available but he does remember that he has tried and failed all of the required alternative inhalers. Can we put in a prior authorization for Spiriva  2.5 mcg?  We are leaving 2 samples of Spiriva  2.5 mcg at front desk for patient to pick up today.  Thank you.

## 2023-07-25 ENCOUNTER — Other Ambulatory Visit (HOSPITAL_COMMUNITY): Payer: Self-pay

## 2023-07-25 ENCOUNTER — Telehealth: Payer: Self-pay | Admitting: Internal Medicine

## 2023-07-25 NOTE — Telephone Encounter (Signed)
 PT states he needs to speak to you about his medication/Ins. Asking for you specifically. His # is [phone]

## 2023-07-25 NOTE — Telephone Encounter (Signed)
 Test claim shows that Spiriva  Respimat IS covered with a co-pay of $185.96. No mention of deductible.   Test claimed alternatives in same category to show the following results:  Spiriva  Handihaler non-formulary Tudorza non-formulary Incruse is preferred $89.16

## 2023-07-26 NOTE — Telephone Encounter (Signed)
 Called patient. Patient spoke with insurance. They are mailing a prior approval form for Dr. Neysa to fill out to get patients Spiriva  Respimat approved for a zero copay. Patient wanted us  to know to look out for form to give to Dr. Neysa. Gave patient our fax number as well to have his insurance fax the form. Patient verbalized understanding. Patient states nothing further needed at this time

## 2023-08-01 ENCOUNTER — Other Ambulatory Visit (HOSPITAL_BASED_OUTPATIENT_CLINIC_OR_DEPARTMENT_OTHER): Payer: Medicare HMO

## 2023-08-03 ENCOUNTER — Telehealth: Payer: Self-pay | Admitting: Internal Medicine

## 2023-08-03 NOTE — Telephone Encounter (Signed)
Patient would like to talk with Amy about getting Spiriva through his insurance. [phone]

## 2023-08-05 ENCOUNTER — Encounter: Payer: Self-pay | Admitting: Internal Medicine

## 2023-08-05 NOTE — Assessment & Plan Note (Signed)
Recent exacerbation leading to ED visit Plan- add Spiriva to Symbicort, CXR

## 2023-08-05 NOTE — Assessment & Plan Note (Signed)
Willing to try mask fitting/ desensitization before giving up on CPAP Emphasize weight control and sleep position

## 2023-08-07 ENCOUNTER — Other Ambulatory Visit (HOSPITAL_COMMUNITY): Payer: Self-pay

## 2023-08-07 ENCOUNTER — Telehealth: Payer: Self-pay

## 2023-08-07 DIAGNOSIS — G4733 Obstructive sleep apnea (adult) (pediatric): Secondary | ICD-10-CM | POA: Diagnosis not present

## 2023-08-07 NOTE — Telephone Encounter (Signed)
Pharmacy Patient Advocate Encounter   Received notification from  Patient Insurance  that prior authorization for Spiriva Respimat is required/requested.   Insurance verification completed.   The patient is insured through [employer] .   Per test claim:  Incruse** is preferred by the insurance.  If suggested medication is appropriate, Please send in a new RX and discontinue this one. If not, please advise as to why it's not appropriate so that we may request a Prior Authorization. Please note, some preferred medications may still require a PA   **Incruse co-pay is $89.16

## 2023-08-08 ENCOUNTER — Other Ambulatory Visit (HOSPITAL_COMMUNITY): Payer: Self-pay

## 2023-08-08 NOTE — Telephone Encounter (Signed)
PT states he can not afford the Incruse co-pay of 89.16. He added that the only thing that works for him is the Spiriva. Last message from Ins Co states:  If not, please advise as to why it's not appropriate so that we may request a Prior Authorization. Please note, some preferred medications may still require a PA   PT would like to pursue PA for Spiriva. He also would like 2 samples left for him upfront. Please call him to advise when samples will be ready. His # [phone]

## 2023-08-10 ENCOUNTER — Other Ambulatory Visit (HOSPITAL_COMMUNITY): Payer: Self-pay

## 2023-08-10 NOTE — Telephone Encounter (Signed)
Pharmacy Patient Advocate Encounter   Received notification from  Patient Insurance  that prior authorization for Spiriva Respimat 2.5MCG/ACT aerosol is required/requested.   Insurance verification completed.   The patient is insured through [employer] .   Per test claim: PA required; PA submitted to above mentioned insurance via Fax Key/confirmation #/EOC [id number] Status is pending

## 2023-08-15 ENCOUNTER — Other Ambulatory Visit (HOSPITAL_COMMUNITY): Payer: Self-pay

## 2023-08-15 MED ORDER — SPIRIVA RESPIMAT 2.5 MCG/ACT IN AERS: INHALATION_SPRAY | RESPIRATORY_TRACT | 12 refills | Status: DC

## 2023-08-15 NOTE — Telephone Encounter (Signed)
Pt walked into the office  Per secure chat with Dr Maple Hudson we can give 2 samples of spiriva 2.5  Samples given to the pt   This is from recent benefits investigation from pharm team:  Not sure what you want to have him take in the future based on this

## 2023-08-15 NOTE — Telephone Encounter (Signed)
Patient on last day of sample Spiriva He wanted rx sent to pharmacy. Rx has been sent.  NFN

## 2023-08-15 NOTE — Telephone Encounter (Signed)
Patient is calling wanting samples of Spiriva 2.5 mcg samples.  Our prior auth team is working on an authorization through his insurance.  Would you like me to give him 2 more samples?  (If we still have any remaining). Please advise.

## 2023-08-15 NOTE — Telephone Encounter (Signed)
PT calling to check status of PA. Thanks.

## 2023-08-16 NOTE — Telephone Encounter (Signed)
If he wants to try it, we can Rx Incruse, # 1, refill x 5,,,   inhale 1 puff, once daily. This would be for when he runs out of the Spiriva.

## 2023-08-16 NOTE — Telephone Encounter (Signed)
Pharmacy Patient Advocate Encounter  Received notification from [facility] [employer] that Prior Authorization for Spiriva Respimat 2.5MCG/ACT aerosol has been DENIED.  Previous denial on file from an authorization that was submitted through the office.  Plan number [id number]   PA #/Case ID/Reference #: [id number]

## 2023-08-16 NOTE — Telephone Encounter (Signed)
Dr Maple Hudson- we gave him 2 samples of spiriva yesterday  The PA for spiriva got denied   Test claimed alternatives in same category to show the following results:  Spiriva Handihaler non-formulary Tudorza non-formulary Incruse is preferred $89.16

## 2023-08-16 NOTE — Telephone Encounter (Signed)
Called and no answer- LMTCB

## 2023-08-21 DIAGNOSIS — Z0100 Encounter for examination of eyes and vision without abnormal findings: Secondary | ICD-10-CM | POA: Diagnosis not present

## 2023-08-21 DIAGNOSIS — H524 Presbyopia: Secondary | ICD-10-CM | POA: Diagnosis not present

## 2023-08-22 DIAGNOSIS — L718 Other rosacea: Secondary | ICD-10-CM | POA: Diagnosis not present

## 2023-08-22 DIAGNOSIS — D2239 Melanocytic nevi of other parts of face: Secondary | ICD-10-CM | POA: Diagnosis not present

## 2023-08-22 DIAGNOSIS — D485 Neoplasm of uncertain behavior of skin: Secondary | ICD-10-CM | POA: Diagnosis not present

## 2023-09-01 DIAGNOSIS — G4733 Obstructive sleep apnea (adult) (pediatric): Secondary | ICD-10-CM | POA: Diagnosis not present

## 2023-09-07 ENCOUNTER — Telehealth: Payer: Self-pay | Admitting: Internal Medicine

## 2023-09-07 DIAGNOSIS — G4733 Obstructive sleep apnea (adult) (pediatric): Secondary | ICD-10-CM | POA: Diagnosis not present

## 2023-09-07 NOTE — Telephone Encounter (Signed)
[name] will be sending over a fax for prior auth for Spiriva . It needs to be signed and faxed back.

## 2023-09-08 NOTE — Telephone Encounter (Signed)
Will forward to prior auth team

## 2023-09-11 NOTE — Telephone Encounter (Signed)
 Patient asked insurance for a second level appeal.I'm not sure if forms have been received but there is something in Dr. Roxy Cedar box.

## 2023-09-11 NOTE — Telephone Encounter (Signed)
 PA request has been Denied. New Encounter created for follow up. For additional info see Pharmacy Prior Auth telephone encounter from 01/20.

## 2023-09-11 NOTE — Telephone Encounter (Signed)
 Tier Exception form was faxed over per [facility] and patient is inquiring as to if we have received?

## 2023-09-12 ENCOUNTER — Other Ambulatory Visit (HOSPITAL_COMMUNITY): Payer: Self-pay

## 2023-09-12 NOTE — Telephone Encounter (Signed)
 Duplicate message therefore closing.

## 2023-09-12 NOTE — Telephone Encounter (Signed)
 Patient is trying to get Spiriva refilled.

## 2023-09-12 NOTE — Telephone Encounter (Signed)
 [facility] [address] in  [place]

## 2023-09-12 NOTE — Telephone Encounter (Signed)
 Nothing has been received over here- may be at the office or still in the queues. Preferred is Incruse Ellipta.

## 2023-09-13 ENCOUNTER — Telehealth: Payer: Self-pay | Admitting: Cardiology

## 2023-09-13 NOTE — Telephone Encounter (Signed)
   Pt c/o of Chest Pain: STAT if active CP, including tightness, pressure, jaw pain, radiating pain to shoulder/upper arm/back, CP unrelieved by Nitro. Symptoms reported of SOB, nausea, vomiting, sweating.  1. Are you having CP right now?  No    2. Are you experiencing any other symptoms (ex. SOB, nausea, vomiting, sweating)? No    3. Is your CP continuous or coming and going? Coming and going    4. Have you taken Nitroglycerin? Yes    5. How long have you been experiencing CP? Past week   6. If NO CP at time of call then end call with telling Pt to call back or call 911 if Chest pain returns prior to return call from triage team.

## 2023-09-13 NOTE — Telephone Encounter (Signed)
 Pt has experienced CP for about a week now, 1-3 episodes a day.  Pain before nitroglycerin 7/8, after 2/3.  Taking nitroglycerin daily "When I am doing something is when it starts, can be picking up sticks in the yard and CP will come on". Radiating left arm pain. Feels similar to when he had his heart attack Been under a lot of stress here lately, mother passed away a year ago.  His sister and he are having to clean up things and they are not getting along in this process. This is causing A LOT of stress for him/upsetting to him. Pt advised to go to ED for evaluation with CP, taking nitroglycerin daily and hx. Patient verbalized understanding and agreeable to plan.  Dr. Anne Fu made aware.

## 2023-09-15 ENCOUNTER — Telehealth: Payer: Self-pay | Admitting: Internal Medicine

## 2023-09-15 NOTE — Telephone Encounter (Signed)
 Patient would like for [facility]. I advised patient that it had been denied but he [facility]. Please call patient for clarification. Call back number [phone]

## 2023-09-18 MED ORDER — SPIRIVA RESPIMAT 2.5 MCG/ACT IN AERS: INHALATION_SPRAY | RESPIRATORY_TRACT | 5 refills | Status: DC

## 2023-09-18 MED ORDER — SPIRIVA RESPIMAT 2.5 MCG/ACT IN AERS: 2.0000 | INHALATION_SPRAY | Freq: Every day | RESPIRATORY_TRACT | Status: DC

## 2023-09-18 NOTE — Telephone Encounter (Signed)
 Called patient.  Patient would like rx for Spiriva 2.5 mg [facility] [address], [place], [place] and also a sample left up at front desk.  [facility] [place] [place] and left one sample at front desk.  Notified patient.  Patient will pick up rx and sample today.  Patient verbalized understanding.

## 2023-09-18 NOTE — Addendum Note (Signed)
 Addended byClyda Greener M on: 09/18/2023 10:16 AM   Modules accepted: Orders

## 2023-09-19 ENCOUNTER — Other Ambulatory Visit (HOSPITAL_COMMUNITY): Payer: Self-pay

## 2023-09-19 ENCOUNTER — Telehealth: Payer: Self-pay

## 2023-09-19 NOTE — Telephone Encounter (Signed)
 Pharmacy Patient Advocate Encounter   Received notification from [employer] that prior authorization for Spiriva Respimat 2.5MCG/ACT aerosol is required/requested.   Insurance verification completed.   The patient is insured through [facility].   Per test claim:  [facility] is not able to process this request because the previous Prior Authorization Appeal Request was DENIED.

## 2023-10-08 NOTE — Progress Notes (Unsigned)
 HPI- M Smoker followed for OSA, COPD, Noct Hypoxemia, Lung Nodules Tobacco Abuse, complicated by CAD/ MI/stents, HTN,  GERD, CKD3a, Depression, Cocaine abuse, ETOH, Obesity,  Hosp 8/2-8/4-  COPD exacerbation HST 08/28/13 AHI 7.7/hr, desat to 85%, body weight 220 lbs Overnight Oximetry Result 03/26/23- Frequent interruptions, but at least 36 minutes on room air (not CPAP) with O2 sat </= 88%. CT 02/07/23- ground glass nodules c/w tobacco bronchilitis  ===================================================================================================================   06/20/23- 57 yoM Smoker followed for OSA, COPD, Noct Hypoxemia, Lung Nodules Tobacco Abuse, complicated by CAD/ MI/stents, HTN,  GERD, CKD3a, Depression, Cocaine abuse, ETOH, Obesity,  Hosp 8/2-8/4-  COPD exacerbation HST 08/28/13 AHI 7.7/hr, desat to 85%, body weight 220 lbs -Ventolin hfa, Trelegy 100/ gen Symbicort, Neb Duoneb,  Nicoderm, [place],  Body weight today-266 lbs ED 06/13/23- COPD exacerbation. Prev hosp 02/17/23- COPD exacerb. CPAP auto 5-20/         replacement ordered 03/21/23 -----C/o SOB and cough persistent today.  Went to urgent care and sent to [facility] ED.06/13/2023. Download compliance- Not using CPAP in 6 months. Very mild OSA. Discussed weight and sleep position. Willing to go for mask fitting at sleep center. Now done with Zpak and prednisone after ED visit. Using nebulizer several times/ day routinely. Plan f/u CT for RLL nodule. Will update CXR for interval check after ED visit. CXR 06/13/23- 1V IMPRESSION: Left lower lung field atelectasis versus developing infiltrate.  10/10/23- 57 yoM Smoker followed for OSA, COPD, Noct Hypoxemia, Lung Nodules Tobacco Abuse, complicated by CAD/ MI/stents, HTN,  GERD, CKD3a, Depression, Cocaine abuse, ETOH, Obesity,  -Ventolin hfa, Spiriva, gen [employer], Neb Duoneb,  [place], [place],  Body weight today-261 lbs Was using CPAP auto 5-20 pending mask refit                          Consider  update chest CT for nodules Download compliance - 23%, AHI 3.2/hr Emphasized compliance goals. CXR 07/02/23 MPRESSION: Chronic bronchitic changes.  No acute cardiopulmonary process.              Discussed the use of AI scribe software for clinical note transcription with the patient, who gave verbal consent to proceed.  History of Present Illness   The patient, with a history of COPD, presents with improved breathing after starting Spiriva and [place] (generic Symbicort). He reports that he has resumed walking and exercising due to the improved respiratory function. He also uses a CPAP machine, which he reports makes him feel great upon waking, but he admits to inconsistent use due to falling asleep on the couch or staying at his mother's house.  The patient also reports a history of smoking, currently at half a pack a day, but expresses a desire to quit, especially now that his breathing has improved. He has also started gardening, which sometimes leaves him winded. He inquires about the possibility of taking an extra inhalation of Breyna before his afternoon dose when he is exerting himself more than usual.     ROS-see HPI   + = positive Constitutional:    weight loss, night sweats, fevers, chills, fatigue, lassitude. HEENT:    headaches, difficulty swallowing, tooth/dental problems, sore throat,       sneezing, itching, ear ache, nasal congestion, post nasal drip, snoring CV:    chest pain, orthopnea, PND, swelling in lower extremities, anasarca,  dizziness, palpitations Resp:   +shortness of breath with exertion or at rest.                productive cough,   non-productive cough, coughing up of blood.              change in color of mucus.  wheezing.   Skin:    rash or lesions. GI:  No-   heartburn, indigestion, abdominal pain, nausea, vomiting, diarrhea,                 change in bowel habits, loss of appetite GU: dysuria, change in color of urine,  no urgency or frequency.   flank pain. MS:   joint pain, stiffness, decreased range of motion, back pain. Neuro-     nothing unusual Psych:  change in mood or affect.  depression or anxiety.   memory loss.  OBJ- Physical Exam General- Alert, Oriented, Affect-appropriate/ intense, Distress- none acute, +overweight Skin- rash-none, lesions- none, excoriation- none Lymphadenopathy- none Head- atraumatic            Eyes- Gross vision intact, PERRLA, conjunctivae and secretions clear            Ears- Hearing, canals-normal            Nose- Clear, no-Septal dev, mucus, polyps, erosion, perforation             Throat- Mallampati II , mucosa clear , drainage- none, tonsils- atrophic Neck- flexible , trachea midline, no stridor , thyroid nl, carotid no bruit Chest - symmetrical excursion , unlabored           Heart/CV- RRR , no murmur , no gallop  , no rub, nl s1 s2                           - JVD- none , edema+bilateral sock-line edema, stasis changes- none, varices- none           Lung- clear to P&A, wheeze+, cough- none , dullness-none, rub- none           Chest wall-  Abd-  Br/ Gen/ Rectal- Not done, not indicated Extrem- cyanosis- none, clubbing, none, atrophy- none, strength- nl Neuro- grossly intact to observation  Assessment and Plan:    Chronic Obstructive Pulmonary Disease (COPD) COPD well-managed with Spiriva and Breyna. Occasional exacerbations possibly due to pollen or weather changes. Informed about Jerral Ralph use before strenuous activities. Albuterol prescribed for rescue use. - Continue Spiriva and Breyna: two puffs of Spiriva in the morning, two puffs of Breyna in the morning and evening. - Allow extra puff of Breyna before strenuous activities if needed, avoid excess. - Prescribe albuterol inhaler for rescue: two puffs every six hours as needed. - Send albuterol prescription to [facility] on [address].  Obstructive Sleep Apnea (OSA) OSA managed  with CPAP. Reports improved condition with use but inconsistent adherence. Goal set for improved CPAP use. - Encourage consistent CPAP use for at least four hours, six nights a week. - Set goal to improve CPAP adherence.  Tobacco Use Disorder Currently smokes half a pack per day. Expresses desire to quit. Encouraged to reduce smoking. - Encourage reduction to a quarter pack per day. - Provide support for smoking cessation.

## 2023-10-10 ENCOUNTER — Encounter: Payer: Self-pay | Admitting: Internal Medicine

## 2023-10-10 ENCOUNTER — Ambulatory Visit: Payer: Medicare HMO | Admitting: Internal Medicine

## 2023-10-10 VITALS — BP 134/78 | HR 95 | Temp 97.9°F | Ht 70.0 in | Wt 261.2 lb

## 2023-10-10 DIAGNOSIS — G4733 Obstructive sleep apnea (adult) (pediatric): Secondary | ICD-10-CM

## 2023-10-10 MED ORDER — ALBUTEROL SULFATE HFA 108 (90 BASE) MCG/ACT IN AERS: 2.0000 | INHALATION_SPRAY | Freq: Four times a day (QID) | RESPIRATORY_TRACT | 6 refills | Status: DC | PRN

## 2023-10-10 NOTE — Patient Instructions (Signed)
 Glad you are doing better with your breathing  Keep working down on those cigarettes  Really try to get CPAP use up to at least 4 hours/ night, at least 6 nights/ week  Script sent for albuterol rescue inhaler--    2 puffs every 6 hours if you need some help beyond your maintenance meds.

## 2023-10-12 ENCOUNTER — Encounter: Payer: Self-pay | Admitting: Internal Medicine

## 2023-10-18 ENCOUNTER — Encounter: Payer: Self-pay | Admitting: Internal Medicine

## 2023-12-18 ENCOUNTER — Telehealth: Payer: Self-pay

## 2023-12-18 ENCOUNTER — Other Ambulatory Visit (HOSPITAL_COMMUNITY): Payer: Self-pay

## 2023-12-18 ENCOUNTER — Ambulatory Visit: Payer: Self-pay

## 2023-12-18 MED ORDER — BUDESONIDE-FORMOTEROL FUMARATE 160-4.5 MCG/ACT IN AERO: INHALATION_SPRAY | RESPIRATORY_TRACT | 12 refills | Status: DC

## 2023-12-18 NOTE — Telephone Encounter (Signed)
 PA is not needed. Generic Symbicort  is covered and approved through the patients insurance until 07/17/2024

## 2023-12-18 NOTE — Telephone Encounter (Signed)
 Pharmacy Patient Advocate Encounter   Received notification from Pt Calls Messages that prior authorization for Breyna  is required/requested.   Insurance verification completed.   The patient is insured through [employer] .   Per test claim: Refill too soon. PA is not needed at this time. Medication was filled 06/02. Next eligible fill date is 06/25.

## 2023-12-18 NOTE — Telephone Encounter (Signed)
 Fax from [facility].  Patient needs prior authorization to fill Breyna .

## 2023-12-18 NOTE — Telephone Encounter (Addendum)
  Chief Complaint: Rx refill Symptoms: L sided facial numbness/tingling, SOB Frequency: today Pertinent Negatives: Patient denies fever, severe SOB, CP Disposition: [x] ED /[x] Urgent Care (no appt availability in office) / [] Appointment(In office/virtual)/ []  [facility]/ [] Home Care/ [] Refused Recommended Disposition /[] [facility]/ []  Follow-up with PCP Additional Notes: Pt initially called Re: INH refill. Pt then stated that he was having L sided facial numbness/tingling. Triager explained that INH should not affect neuro sx. Pt denied any other sx and is speaking clearly. Triager strongly advised that pt should seek medical attention to evaluate for stroke. Patient verbalized understanding.  Additionally, pt notified per Cpht note today that Rx was filled today.   Copied from CRM [id number]. Topic: Clinical - Red Word Triage >> Dec 18, 2023  4:23 PM Dyann Glaser G wrote: Reason for CRM: PT CALLING ABOUT HIS BREYNA  MED AND STATED THIS IS THE ONLY MED THAT WORKS FOR HIM AND HE IS HAVING A HARD TIME BREATHING BECAUSE HE IS OUT OF THE MED. CALLED CAL SINCE PT ASKED FOR AMY PER REP SHE WAS NOT RESPONDING TO MESSAGE. PT STATED HE DID NOT WANT TO WAIT ON A CALL BACK BECAUSE SHE NEVER CALLS BACK OR ITS EITHER TWO WEEKS LATER. STATED HE IS OUT OF MEDS. WARM TRANSFERRING TO NURSE TRIAGE. Reason for Disposition  Caller with prescription and triager answers question  [1] Numbness (i.e., loss of sensation) of the face, arm / hand, or leg / foot on one side of the body AND [2] sudden onset AND [3] present now  Answer Assessment - Initial Assessment Questions E2C2 Pulmonary Triage - Initial Assessment Questions "Chief Complaint (e.g., cough, sob, wheezing, fever, chills, sweat or additional symptoms) *Go to specific symptom protocol after initial questions. Calling for Breyna  INH but insurance requiring PA - pt states he is completely out  1. DRUG NAME: "What medicine do you need to have  refilled?"     Breyna  2. REFILLS REMAINING: "How many refills are remaining?" (Note: The label on the medicine or pill bottle will show how many refills are remaining. If there are no refills remaining, then a renewal may be needed.)     0 3. EXPIRATION DATE: "What is the expiration date?" (Note: The label states when the prescription will expire, and thus can no longer be refilled.)     N/a 4. PRESCRIBING HCP: "Who prescribed it?" Reason: If prescribed by specialist, call should be referred to that group.     LBPU 5. SYMPTOMS: "Do you have any symptoms?"     SOB, L sided facial numbness/tingling - started today 6. PREGNANCY: "Is there any chance that you are pregnant?" "When was your last menstrual period?"     N/a  Protocols used: Medication Refill and Renewal Call-A-AH, Neurologic Deficit-A-AH

## 2023-12-18 NOTE — Telephone Encounter (Signed)
 Sent in Rx for Symbicort  160 mcg.  Patient informed.

## 2023-12-19 NOTE — Telephone Encounter (Signed)
 Agree with getting evaluated. Looks like the breyna  refill is too soon when the claim was ran yesterday. Next refill is due 06/25.   I will CC pulmonary (Dr. Linder Revere) also

## 2024-01-01 ENCOUNTER — Ambulatory Visit: Payer: Self-pay | Admitting: General Practice

## 2024-01-01 ENCOUNTER — Ambulatory Visit (INDEPENDENT_AMBULATORY_CARE_PROVIDER_SITE_OTHER): Admitting: General Practice

## 2024-01-01 ENCOUNTER — Encounter: Payer: Self-pay | Admitting: General Practice

## 2024-01-01 ENCOUNTER — Ambulatory Visit (INDEPENDENT_AMBULATORY_CARE_PROVIDER_SITE_OTHER)
Admission: RE | Admit: 2024-01-01 | Discharge: 2024-01-01 | Disposition: A | Discharge status: Home / Self Care | Source: Ambulatory Visit | Attending: General Practice | Admitting: General Practice

## 2024-01-01 VITALS — BP 134/68 | HR 81 | Temp 98.1°F | Ht 70.0 in | Wt 262.0 lb

## 2024-01-01 DIAGNOSIS — R0602 Shortness of breath: Secondary | ICD-10-CM

## 2024-01-01 DIAGNOSIS — R918 Other nonspecific abnormal finding of lung field: Secondary | ICD-10-CM | POA: Diagnosis not present

## 2024-01-01 DIAGNOSIS — R059 Cough, unspecified: Secondary | ICD-10-CM | POA: Diagnosis not present

## 2024-01-01 DIAGNOSIS — R079 Chest pain, unspecified: Secondary | ICD-10-CM | POA: Diagnosis not present

## 2024-01-01 DIAGNOSIS — J441 Chronic obstructive pulmonary disease with (acute) exacerbation: Secondary | ICD-10-CM

## 2024-01-01 MED ORDER — PREDNISONE 20 MG PO TABS: 40.0000 mg | ORAL_TABLET | Freq: Every day | ORAL | 0 refills | Status: AC

## 2024-01-01 MED ORDER — AZITHROMYCIN 250 MG PO TABS: ORAL_TABLET | ORAL | 0 refills | Status: DC

## 2024-01-01 MED ORDER — PREDNISONE 20 MG PO TABS: 40.0000 mg | ORAL_TABLET | Freq: Every day | ORAL | 0 refills | Status: DC

## 2024-01-01 MED ORDER — AZITHROMYCIN 250 MG PO TABS: ORAL_TABLET | ORAL | 0 refills | Status: AC
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
## 2024-01-01 NOTE — Assessment & Plan Note (Signed)
 Symptoms suggestive today of acute on chronic exacerbation.  Given his presentation today in the office, long discussion with patient and his g/f and suggestive patient should go to the ER.  O2 levels 95% and BP 134/68.  Patient and his g/f decline ER disposition.  His g/f states can't you just give him the pills to take.  Discussed with PCP, who also agreed to ER disposition. Patient declines again.  Chest x-ray ordered STAT.  Rx sent to [facility] prednisone  20 mg tablets. Take 2 tablets by mouth once daily in the morning for 5 days. Rx sent to [facility] Azithromycin  antibiotics for infection. Take 2 tablets by mouth today, then 1 tablet daily for 4 additional days.  Await results.  ER precautions discussed at length with patient. Verbalizes understanding. F/u in 2 weeks with PCP.

## 2024-01-01 NOTE — Patient Instructions (Signed)
 Complete xray(s) prior to leaving today. I will notify you of your results once received.  Start prednisone  20 mg tablets. Take 2 tablets by mouth once daily in the morning for 5 days.  Start Azithromycin  antibiotics for infection. Take 2 tablets by mouth today, then 1 tablet daily for 4 additional days.  As discussed, I do recommend going to the ER if your symptoms worsen.   Continue your inhalers as prescribed.   It was a pleasure meeting you!

## 2024-01-18 ENCOUNTER — Ambulatory Visit: Admitting: Nurse Practitioner

## 2024-01-18 ENCOUNTER — Telehealth: Payer: Self-pay

## 2024-01-18 ENCOUNTER — Ambulatory Visit
Admission: RE | Admit: 2024-01-18 | Discharge: 2024-01-18 | Disposition: A | Discharge status: Home / Self Care | Source: Ambulatory Visit | Attending: Nurse Practitioner | Admitting: Nurse Practitioner

## 2024-01-18 ENCOUNTER — Ambulatory Visit: Payer: Self-pay | Admitting: Nurse Practitioner

## 2024-01-18 ENCOUNTER — Ambulatory Visit (INDEPENDENT_AMBULATORY_CARE_PROVIDER_SITE_OTHER)
Admission: RE | Admit: 2024-01-18 | Discharge: 2024-01-18 | Disposition: A | Discharge status: Home / Self Care | Source: Ambulatory Visit | Attending: Nurse Practitioner | Admitting: Nurse Practitioner

## 2024-01-18 ENCOUNTER — Other Ambulatory Visit: Payer: Self-pay | Admitting: Nurse Practitioner

## 2024-01-18 VITALS — BP 148/84 | HR 89 | Temp 97.6°F | Ht 70.0 in | Wt 261.2 lb

## 2024-01-18 DIAGNOSIS — R9389 Abnormal findings on diagnostic imaging of other specified body structures: Secondary | ICD-10-CM | POA: Diagnosis not present

## 2024-01-18 DIAGNOSIS — R062 Wheezing: Secondary | ICD-10-CM

## 2024-01-18 DIAGNOSIS — J449 Chronic obstructive pulmonary disease, unspecified: Secondary | ICD-10-CM

## 2024-01-18 DIAGNOSIS — R059 Cough, unspecified: Secondary | ICD-10-CM | POA: Diagnosis not present

## 2024-01-18 DIAGNOSIS — R0602 Shortness of breath: Secondary | ICD-10-CM

## 2024-01-18 DIAGNOSIS — J439 Emphysema, unspecified: Secondary | ICD-10-CM | POA: Diagnosis not present

## 2024-01-18 DIAGNOSIS — R918 Other nonspecific abnormal finding of lung field: Secondary | ICD-10-CM | POA: Diagnosis not present

## 2024-01-18 MED ORDER — IPRATROPIUM-ALBUTEROL 0.5-2.5 (3) MG/3ML IN SOLN
3.0000 mL | Freq: Once | RESPIRATORY_TRACT | Status: AC
  Administered 2024-01-18: 3 mL via RESPIRATORY_TRACT

## 2024-01-18 MED ORDER — PREDNISONE 20 MG PO TABS: ORAL_TABLET | ORAL | 0 refills | Status: AC

## 2024-01-18 NOTE — Assessment & Plan Note (Signed)
 Hx of the same. Had some relief with steroids. Will redo a prednisone  taper. If he does not improve he needs to reach out to pulmonary. Reviewed ED precautions

## 2024-01-18 NOTE — Assessment & Plan Note (Signed)
 Pending repeat CXR

## 2024-01-18 NOTE — Assessment & Plan Note (Signed)
 History of the same patient currently maintained on Spiriva  and breyna  and albuterol  as needed.  Patient is having increased albuterol  use will do steroid taper along with DuoNeb in office.  ED precautions reviewed

## 2024-01-18 NOTE — Progress Notes (Signed)
 Acute Office Visit  Subjective:     Patient ID: [name], male    DOB: 12-19-1965, 58 y.o.   MRN: [id number]  Chief Complaint  Patient presents with   Follow-up    COPD. Pt complains of feeling a little better.     HPI Patient is in today for COPD exacerbation.  Patient was seen by colleague bableen on 01/01/2024.  At that juncture patient was complaining of a cough that started 2 months ago and was intermittent.  Does have a history of COPD.  He endorsed increased shortness of breath with the cough and increased sputum production.  He reported wheezing and feeling weak.  He is followed by pulmonology.  Currently taking Breyna  2 puffs twice Breo 2 puffs once a day.  Patient also had albuterol  at the time.  Chest x-ray was ordered patient was started on prednisone  taper and a Z-Pak.  Chest x-ray showed some increasing interstitial changes in the lungs.  They recommended short-term follow-up or CT if clinically warranted.  He is here for follow-up to repeat chest x-ray. States that he has had good and bad days. States that the cough has improved some. States that he is getting sputum production that is dark. He states that his [facility] is normal for him. States  that he is using the albuterol  4-5 times a day. States that he is working on stopping smoking. He is doing a 0.25 a day   He is having some chest pain and is followed by cardiology . State that whe he has a cough he will have chest discomfort.  But outside of coughing he is not having any chest pain   Review of Systems  Constitutional:  Negative for chills and fever.  Respiratory:  Positive for shortness of breath.   Cardiovascular:  Positive for chest pain.  Neurological:  Negative for dizziness and headaches.        Objective:    BP (!) 148/84   Pulse 89   Temp 97.6 F (36.4 C) (Oral)   Ht 5' 10 (1.778 m)   Wt 261 lb 3.2 oz (118.5 kg)   SpO2 93%   BMI 37.48 kg/m  BP Readings from Last 3 Encounters:  01/18/24 (!)  148/84  01/01/24 134/68  10/10/23 134/78   Wt Readings from Last 3 Encounters:  01/18/24 261 lb 3.2 oz (118.5 kg)  01/01/24 262 lb (118.8 kg)  10/10/23 261 lb 3.2 oz (118.5 kg)   SpO2 Readings from Last 3 Encounters:  01/18/24 93%  01/01/24 95%  10/10/23 94%      Physical Exam Vitals and nursing note reviewed.  Constitutional:      Appearance: Normal appearance.  Cardiovascular:     Rate and Rhythm: Normal rate and regular rhythm.     Heart sounds: Normal heart sounds.  Pulmonary:     Breath sounds: Wheezing present.     Comments: Labored breathing  Chest:     Chest wall: No tenderness.  Neurological:     Mental Status: He is alert.     No results found for any visits on 01/18/24.      Assessment & Plan:   Problem List Items Addressed This Visit       Respiratory   COPD mixed type (HCC) - Primary   History of the same patient currently maintained on Spiriva  and breyna  and albuterol  as needed.  Patient is having increased albuterol  use will do steroid taper along with DuoNeb in office.  ED precautions reviewed      Relevant Medications   predniSONE  (DELTASONE ) 20 MG tablet     Other   Dyspnea   Hx of the same. Had some relief with steroids. Will redo a prednisone  taper. If he does not improve he needs to reach out to pulmonary. Reviewed ED precautions       Relevant Medications   predniSONE  (DELTASONE ) 20 MG tablet   Wheezing   Noted throughout lung fields in office today.  Continue using inhalers as prescribed along with albuterol  as needed.  DuoNeb given in office which offered some relief.  Prednisone  taper as      Relevant Medications   predniSONE  (DELTASONE ) 20 MG tablet   Abnormal chest x-ray   Pending repeat CXR      Relevant Orders   DG Chest 2 View (Completed)    Meds ordered this encounter  Medications   ipratropium-albuterol  (DUONEB) 0.5-2.5 (3) MG/3ML nebulizer solution 3 mL   predniSONE  (DELTASONE ) 20 MG tablet    Sig: Take 3  tablets (60 mg total) by mouth daily with breakfast for 3 days, THEN 2 tablets (40 mg total) daily with breakfast for 3 days, THEN 1 tablet (20 mg total) daily with breakfast for 3 days, THEN 0.5 tablets (10 mg total) daily with breakfast for 2 days. Avoid NSAIDs.    Dispense:  19 tablet    Refill:  0    Supervising Provider:   RANDEEN HARDY A [[id number]]    Return in about 2 weeks (around 02/01/2024).  Adina Crandall, NP

## 2024-01-18 NOTE — Patient Instructions (Signed)
 Nice to see you today Avoid NSAIDS like Ibuprofen, Motrin, Aleve , Naproxen , BC/Goody powders  while on the prednisone   Follow up with me in 2 weeks, sooner if you need me

## 2024-01-18 NOTE — Assessment & Plan Note (Signed)
 Noted throughout lung fields in office today.  Continue using inhalers as prescribed along with albuterol  as needed.  DuoNeb given in office which offered some relief.  Prednisone  taper as

## 2024-01-18 NOTE — Telephone Encounter (Signed)
 Called pt to inform him of order for Stat CT scan.  Pt chooses [facility] for CT scan.

## 2024-01-22 ENCOUNTER — Ambulatory Visit: Payer: Self-pay | Admitting: Nurse Practitioner

## 2024-02-02 ENCOUNTER — Ambulatory Visit: Admitting: Nurse Practitioner

## 2024-02-02 VITALS — BP 142/98 | HR 99 | Temp 98.0°F | Ht 70.0 in | Wt 264.6 lb

## 2024-02-02 DIAGNOSIS — T782XXA Anaphylactic shock, unspecified, initial encounter: Secondary | ICD-10-CM | POA: Diagnosis not present

## 2024-02-02 DIAGNOSIS — Z1211 Encounter for screening for malignant neoplasm of colon: Secondary | ICD-10-CM | POA: Diagnosis not present

## 2024-02-02 DIAGNOSIS — J449 Chronic obstructive pulmonary disease, unspecified: Secondary | ICD-10-CM | POA: Diagnosis not present

## 2024-02-02 DIAGNOSIS — Z23 Encounter for immunization: Secondary | ICD-10-CM | POA: Diagnosis not present

## 2024-02-02 NOTE — Patient Instructions (Addendum)
 Nice to see you today  I want to see you in 2 months for your physical and full panel of labs We did update the pneumonia vaccine today   Take a benadryl  before bed today   Call GI at if you have not heard from them in 2 weeks   Address: [address], [place], [place] [address] Phone: [phone]

## 2024-02-02 NOTE — Progress Notes (Signed)
 Established Patient Office Visit  Subjective   Patient ID: [name], male    DOB: Apr 28, 1966  Age: 58 y.o. MRN: [id number]  Chief Complaint  Patient presents with   Follow-up    Pt complains of doing better with COPD since last visit.     HPI  COPD: Patient was last seen by me on 01/18/2024 after a COPD exacerbation.  We did repeat a chest x-ray which showed no improvement and pursued a CT scan of his chest without contrast.  CT scan showed emphysema and bronchial thickening along with other chronic issues including lung nodules.  Patient was given a nebulizer in office and started on high-dose prednisone  taper.  He is here today for follow-up.  Of note patient is followed by pulmonology for his COPD  He did do a treatment in office and then did the prednisone  taper as prescribed. States that he has had an improvement but not back to baseline too. States that he is using the albuterol  inhaler. States that the frequency of albuterol  use is depending on his activity. If he is doing physical activity he will stop 1-2 times and use it. States that it has improved   Has appointment with cardiology  next week and then   States that he was eating chicken and started swelling and he did administer his epi pen. States that he gave it to himself approx 12-1pm.    Review of Systems  Constitutional:  Negative for chills and fever.  Respiratory:  Negative for shortness of breath.   Cardiovascular:  Negative for chest pain.  Neurological:  Negative for headaches.  Psychiatric/Behavioral:  Negative for hallucinations and suicidal ideas.       Objective:     BP (!) 142/98   Pulse 99   Temp 98 F (36.7 C) (Oral)   Ht 5' 10 (1.778 m)   Wt 264 lb 9.6 oz (120 kg)   SpO2 96%   BMI 37.97 kg/m  BP Readings from Last 3 Encounters:  02/02/24 (!) 142/98  01/18/24 (!) 148/84  01/01/24 134/68   Wt Readings from Last 3 Encounters:  02/02/24 264 lb 9.6 oz (120 kg)  01/18/24 261 lb 3.2 oz  (118.5 kg)  01/01/24 262 lb (118.8 kg)   SpO2 Readings from Last 3 Encounters:  02/02/24 96%  01/18/24 93%  01/01/24 95%      Physical Exam Vitals and nursing note reviewed.  Constitutional:      Appearance: Normal appearance.  HENT:     Mouth/Throat:     Mouth: Mucous membranes are dry.     Pharynx: Posterior oropharyngeal erythema present.  Cardiovascular:     Rate and Rhythm: Normal rate and regular rhythm.     Heart sounds: Normal heart sounds.  Pulmonary:     Effort: Pulmonary effort is normal.     Breath sounds: Normal breath sounds.  Abdominal:     General: Bowel sounds are normal. There is no distension.     Palpations: There is no mass.     Tenderness: There is no abdominal tenderness.     Hernia: No hernia is present.  Lymphadenopathy:     Cervical: No cervical adenopathy.  Neurological:     Mental Status: He is alert.      No results found for any visits on 02/02/24.    The ASCVD Risk score (Arnett DK, et al., 2019) failed to calculate for the following reasons:   Risk score cannot be calculated because patient  has a medical history suggesting prior/existing ASCVD    Assessment & Plan:   Problem List Items Addressed This Visit       Respiratory   COPD mixed type (HCC) - Primary   Patient currently maintained on Breyna , sprivia, and albuterol  inhaler.  Patient feeling better after having prednisone  taper.  Continue following with pulmonology continue taking inhalers as recommended        Other   Anaphylactic syndrome   Patient has a history of seafood allergy.  Self-administered EpiPen  approximately 3-3 and half hours prior to office visit.  Patient did not get seen in the emergency department.  Patient clinically stable in office does have some erythema to posterior oropharynx secondary to dry mouth.  No swelling no other signs and symptoms of anaphylaxis.  Patient encouraged to take Benadryl  tonight prior to bed signs and symptoms reviewed when to  seek emergent health care.      Other Visit Diagnoses       Need for pneumococcal 20-valent conjugate vaccination       Relevant Orders   Pneumococcal conjugate vaccine 20-valent (Prevnar 20) (Completed)     Screening for colon cancer       Relevant Orders   Ambulatory referral to Gastroenterology       Return in about 10 weeks (around 04/12/2024) for CPE and Labs.    Adina Crandall, NP

## 2024-02-02 NOTE — Assessment & Plan Note (Signed)
 Patient has a history of seafood allergy.  Self-administered EpiPen  approximately 3-3 and half hours prior to office visit.  Patient did not get seen in the emergency department.  Patient clinically stable in office does have some erythema to posterior oropharynx secondary to dry mouth.  No swelling no other signs and symptoms of anaphylaxis.  Patient encouraged to take Benadryl  tonight prior to bed signs and symptoms reviewed when to seek emergent health care.

## 2024-02-02 NOTE — Assessment & Plan Note (Signed)
 Patient currently maintained on Breyna , sprivia, and albuterol  inhaler.  Patient feeling better after having prednisone  taper.  Continue following with pulmonology continue taking inhalers as recommended

## 2024-02-03 NOTE — H&P (View-Only) (Signed)
 Cardiology Office Note   Date:  02/06/2024  ID:  [name], DOB 1966-02-07, MRN [id number] PCP: Wendee Lynwood HERO, NP  [facility] Providers Cardiologist:  Oneil Parchment, MD {   History of Present Illness [name] is a 57 y.o. male with a past medical history of coronary artery disease,  bronchitis, chronic foot injury who presents for follow-up appointment.  Was last seen in August 2024 by Dr. Parchment.  He reported at that time that he had stopped taking most of his medications including Crestor  due to PND, making energy.  At that time he was feeling terrible anxious when on previous medication regimen.  He since stopped these medications he reported feeling like a new person has regained his energy motivation.  He continues to take aspirin , amlodipine , and his inhaler.  Also discussed chronic foot pain which is a result of a fall from a tree in 2013 that crushed his heel.  Despite phonophoresis the plantar persists and the only remaining treatment function was amputation which the patient is reluctant to pursue.  Reports managing his pain distances he can and is hopeful for his condition to improve over time.  In addition to his physical health concerns he also discussed in effort to quit smoking.  He was using a patch and reportedly was doing better.  Today, he presents with coronary artery disease and COPD with chest pain.  He experienced severe chest pain last night during light activity, requiring nitroglycerin  for relief. Similar episodes occur two to three times a week over the past few months. He previously had a myocardial infarction confirmed by blood work, leading to the placement of two stents.  He reports persistent fatigue and significant leg swelling for two to three months, with improvement upon leg elevation.  He manages COPD with several inhalers, and a nebulizer. He denies using Symbicort . He takes amlodipine  for blood pressure management and has not reported issues  with his current medications. Physical activity has decreased due to not feeling well.  No orthopnea, PND. Reports no palpitations.   ROS: pertinent ROS in HPI  Studies Reviewed EKG Interpretation Date/Time:  Tuesday February 06 2024 11:25:29 EDT Ventricular Rate:  88 PR Interval:  134 QRS Duration:  78 QT Interval:  378 QTC Calculation: 457 R Axis:   66  Text Interpretation: Normal sinus rhythm Normal ECG When compared with ECG of 13-Jun-2023 16:07, Premature ventricular complexes are no longer Present Confirmed by Lucien Blanc [phone]) on 02/06/2024 11:45:31 AM    IMPRESSIONS     1. Left ventricular ejection fraction, by estimation, is 60 to 65%. The  left ventricle has normal function. The left ventricle has no regional  wall motion abnormalities. Left ventricular diastolic parameters were  normal. The average left ventricular  global longitudinal strain is -22.5 %. The global longitudinal strain is  normal.   2. Right ventricular systolic function is normal. The right ventricular  size is normal. Tricuspid regurgitation signal is inadequate for assessing  PA pressure.   3. The mitral valve is normal in structure. No evidence of mitral valve  regurgitation. No evidence of mitral stenosis.   4. The aortic valve is tricuspid. There is mild calcification of the  aortic valve. Aortic valve regurgitation is trivial. No aortic stenosis is  present.   5. Aortic dilatation noted. There is mild dilatation of the ascending  aorta, measuring 39 mm.   6. The inferior vena cava is normal in size with <50% respiratory  variability,  suggesting right atrial pressure of 8 mmHg.   FINDINGS   Left Ventricle: Left ventricular ejection fraction, by estimation, is 60  to 65%. The left ventricle has normal function. The left ventricle has no  regional wall motion abnormalities. The average left ventricular global  longitudinal strain is -22.5 %.  The global longitudinal strain is normal. The left  ventricular internal  cavity size was normal in size. There is no left ventricular hypertrophy.  Left ventricular diastolic parameters were normal.   Right Ventricle: The right ventricular size is normal. No increase in  right ventricular wall thickness. Right ventricular systolic function is  normal. Tricuspid regurgitation signal is inadequate for assessing PA  pressure.   Left Atrium: Left atrial size was normal in size.   Right Atrium: Right atrial size was normal in size.   Pericardium: There is no evidence of pericardial effusion.   Mitral Valve: The mitral valve is normal in structure. No evidence of  mitral valve regurgitation. No evidence of mitral valve stenosis.   Tricuspid Valve: The tricuspid valve is normal in structure. Tricuspid  valve regurgitation is not demonstrated.   Aortic Valve: The aortic valve is tricuspid. There is mild calcification  of the aortic valve. Aortic valve regurgitation is trivial. No aortic  stenosis is present.   Pulmonic Valve: The pulmonic valve was normal in structure. Pulmonic valve  regurgitation is not visualized.   Aorta: Aortic dilatation noted. There is mild dilatation of the ascending  aorta, measuring 39 mm.   Venous: The inferior vena cava is normal in size with less than 50%  respiratory variability, suggesting right atrial pressure of 8 mmHg.   IAS/Shunts: No atrial level shunt detected by color flow Doppler.     Physical Exam VS:  BP (!) 146/76   Pulse 88   Ht 5' 10 (1.778 m)   Wt 268 lb 3.2 oz (121.7 kg)   SpO2 94%   BMI 38.48 kg/m        Wt Readings from Last 3 Encounters:  02/06/24 268 lb 3.2 oz (121.7 kg)  02/02/24 264 lb 9.6 oz (120 kg)  01/18/24 261 lb 3.2 oz (118.5 kg)    GEN: Well nourished, well developed in no acute distress NECK: No JVD; No carotid bruits CARDIAC: RRR, no murmurs, rubs, gallops RESPIRATORY:  Clear to auscultation without rales, wheezing or rhonchi  ABDOMEN: Soft, non-tender,  non-distended EXTREMITIES:  2+ pitting bilateral edema; No deformity   ASSESSMENT AND PLAN Angina pectoris Intermittent chest pain relieved by nitroglycerin , recent rest episode, no acute EKG changes, history of heart attack without EKG changes, increased episode frequency. - Order cardiac enzymes (troponin). - Schedule stress test. - Advise nitroglycerin  use as needed. - Discussed heart attack potential without EKG changes.  Edema Bilateral lower extremity edema, pitting present, improves with leg elevation, possible heart failure sign. - Prescribe Lasix  40 mg for three days. - Order echocardiogram. - Order BNP.  Hypertension Blood pressure 146/76 mmHg, inconsistent amlodipine  use, Lasix  may aid control. - Continue amlodipine . - Advise consistent medication timing. - Monitor blood pressure with Lasix .  Chronic obstructive pulmonary disease (COPD) COPD with wheezing and shortness of breath, uses inhalers and nebulizer. - Contact pulmonologist Dr. Neysa. - Continue inhaler regimen and nebulizer use.     Dispo: He can follow-up with me in a month to review testing   Signed, Orren LOISE Fabry, PA-C

## 2024-02-03 NOTE — Progress Notes (Unsigned)
 Cardiology Office Note   Date:  02/06/2024  ID:  [name], DOB 1966/03/19, MRN [id number] PCP: Wendee Lynwood HERO, NP  [facility] Providers Cardiologist:  Oneil Parchment, MD {   History of Present Illness [name] is a 58 y.o. male with a past medical history of coronary artery disease,  bronchitis, chronic foot injury who presents for follow-up appointment.  Was last seen in August 2024 by Dr. Parchment.  He reported at that time that he had stopped taking most of his medications including Crestor  due to PND, making energy.  At that time he was feeling terrible anxious when on previous medication regimen.  He since stopped these medications he reported feeling like a new person has regained his energy motivation.  He continues to take aspirin , amlodipine , and his inhaler.  Also discussed chronic foot pain which is a result of a fall from a tree in 2013 that crushed his heel.  Despite phonophoresis the plantar persists and the only remaining treatment function was amputation which the patient is reluctant to pursue.  Reports managing his pain distances he can and is hopeful for his condition to improve over time.  In addition to his physical health concerns he also discussed in effort to quit smoking.  He was using a patch and reportedly was doing better.  Today, he presents with coronary artery disease and COPD with chest pain.  He experienced severe chest pain last night during light activity, requiring nitroglycerin  for relief. Similar episodes occur two to three times a week over the past few months. He previously had a myocardial infarction confirmed by blood work, leading to the placement of two stents.  He reports persistent fatigue and significant leg swelling for two to three months, with improvement upon leg elevation.  He manages COPD with several inhalers, and a nebulizer. He denies using Symbicort . He takes amlodipine  for blood pressure management and has not reported issues  with his current medications. Physical activity has decreased due to not feeling well.  No orthopnea, PND. Reports no palpitations.   ROS: pertinent ROS in HPI  Studies Reviewed EKG Interpretation Date/Time:  Tuesday February 06 2024 11:25:29 EDT Ventricular Rate:  88 PR Interval:  134 QRS Duration:  78 QT Interval:  378 QTC Calculation: 457 R Axis:   66  Text Interpretation: Normal sinus rhythm Normal ECG When compared with ECG of 13-Jun-2023 16:07, Premature ventricular complexes are no longer Present Confirmed by Lucien Blanc [phone]) on 02/06/2024 11:45:31 AM    IMPRESSIONS     1. Left ventricular ejection fraction, by estimation, is 60 to 65%. The  left ventricle has normal function. The left ventricle has no regional  wall motion abnormalities. Left ventricular diastolic parameters were  normal. The average left ventricular  global longitudinal strain is -22.5 %. The global longitudinal strain is  normal.   2. Right ventricular systolic function is normal. The right ventricular  size is normal. Tricuspid regurgitation signal is inadequate for assessing  PA pressure.   3. The mitral valve is normal in structure. No evidence of mitral valve  regurgitation. No evidence of mitral stenosis.   4. The aortic valve is tricuspid. There is mild calcification of the  aortic valve. Aortic valve regurgitation is trivial. No aortic stenosis is  present.   5. Aortic dilatation noted. There is mild dilatation of the ascending  aorta, measuring 39 mm.   6. The inferior vena cava is normal in size with <50% respiratory  variability,  suggesting right atrial pressure of 8 mmHg.   FINDINGS   Left Ventricle: Left ventricular ejection fraction, by estimation, is 60  to 65%. The left ventricle has normal function. The left ventricle has no  regional wall motion abnormalities. The average left ventricular global  longitudinal strain is -22.5 %.  The global longitudinal strain is normal. The left  ventricular internal  cavity size was normal in size. There is no left ventricular hypertrophy.  Left ventricular diastolic parameters were normal.   Right Ventricle: The right ventricular size is normal. No increase in  right ventricular wall thickness. Right ventricular systolic function is  normal. Tricuspid regurgitation signal is inadequate for assessing PA  pressure.   Left Atrium: Left atrial size was normal in size.   Right Atrium: Right atrial size was normal in size.   Pericardium: There is no evidence of pericardial effusion.   Mitral Valve: The mitral valve is normal in structure. No evidence of  mitral valve regurgitation. No evidence of mitral valve stenosis.   Tricuspid Valve: The tricuspid valve is normal in structure. Tricuspid  valve regurgitation is not demonstrated.   Aortic Valve: The aortic valve is tricuspid. There is mild calcification  of the aortic valve. Aortic valve regurgitation is trivial. No aortic  stenosis is present.   Pulmonic Valve: The pulmonic valve was normal in structure. Pulmonic valve  regurgitation is not visualized.   Aorta: Aortic dilatation noted. There is mild dilatation of the ascending  aorta, measuring 39 mm.   Venous: The inferior vena cava is normal in size with less than 50%  respiratory variability, suggesting right atrial pressure of 8 mmHg.   IAS/Shunts: No atrial level shunt detected by color flow Doppler.     Physical Exam VS:  BP (!) 146/76   Pulse 88   Ht 5' 10 (1.778 m)   Wt 268 lb 3.2 oz (121.7 kg)   SpO2 94%   BMI 38.48 kg/m        Wt Readings from Last 3 Encounters:  02/06/24 268 lb 3.2 oz (121.7 kg)  02/02/24 264 lb 9.6 oz (120 kg)  01/18/24 261 lb 3.2 oz (118.5 kg)    GEN: Well nourished, well developed in no acute distress NECK: No JVD; No carotid bruits CARDIAC: RRR, no murmurs, rubs, gallops RESPIRATORY:  Clear to auscultation without rales, wheezing or rhonchi  ABDOMEN: Soft, non-tender,  non-distended EXTREMITIES:  2+ pitting bilateral edema; No deformity   ASSESSMENT AND PLAN Angina pectoris Intermittent chest pain relieved by nitroglycerin , recent rest episode, no acute EKG changes, history of heart attack without EKG changes, increased episode frequency. - Order cardiac enzymes (troponin). - Schedule stress test. - Advise nitroglycerin  use as needed. - Discussed heart attack potential without EKG changes.  Edema Bilateral lower extremity edema, pitting present, improves with leg elevation, possible heart failure sign. - Prescribe Lasix  40 mg for three days. - Order echocardiogram. - Order BNP.  Hypertension Blood pressure 146/76 mmHg, inconsistent amlodipine  use, Lasix  may aid control. - Continue amlodipine . - Advise consistent medication timing. - Monitor blood pressure with Lasix .  Chronic obstructive pulmonary disease (COPD) COPD with wheezing and shortness of breath, uses inhalers and nebulizer. - Contact pulmonologist Dr. Neysa. - Continue inhaler regimen and nebulizer use.     Dispo: He can follow-up with me in a month to review testing   Signed, Orren LOISE Fabry, PA-C

## 2024-02-06 ENCOUNTER — Ambulatory Visit: Payer: Self-pay | Admitting: *Deleted

## 2024-02-06 ENCOUNTER — Ambulatory Visit: Attending: Cardiology | Admitting: Physician Assistant

## 2024-02-06 ENCOUNTER — Encounter: Payer: Self-pay | Admitting: *Deleted

## 2024-02-06 ENCOUNTER — Encounter: Payer: Self-pay | Admitting: Physician Assistant

## 2024-02-06 VITALS — BP 146/76 | HR 88 | Ht 70.0 in | Wt 268.2 lb

## 2024-02-06 DIAGNOSIS — R079 Chest pain, unspecified: Secondary | ICD-10-CM | POA: Diagnosis not present

## 2024-02-06 DIAGNOSIS — I25118 Atherosclerotic heart disease of native coronary artery with other forms of angina pectoris: Secondary | ICD-10-CM

## 2024-02-06 DIAGNOSIS — Z720 Tobacco use: Secondary | ICD-10-CM

## 2024-02-06 DIAGNOSIS — R0609 Other forms of dyspnea: Secondary | ICD-10-CM | POA: Diagnosis not present

## 2024-02-06 DIAGNOSIS — I10 Essential (primary) hypertension: Secondary | ICD-10-CM

## 2024-02-06 DIAGNOSIS — J42 Unspecified chronic bronchitis: Secondary | ICD-10-CM | POA: Diagnosis not present

## 2024-02-06 DIAGNOSIS — R600 Localized edema: Secondary | ICD-10-CM | POA: Diagnosis not present

## 2024-02-06 DIAGNOSIS — Z01818 Encounter for other preprocedural examination: Secondary | ICD-10-CM

## 2024-02-06 DIAGNOSIS — R0602 Shortness of breath: Secondary | ICD-10-CM

## 2024-02-06 LAB — BASIC METABOLIC PANEL WITH GFR
BUN/Creatinine Ratio: 19 (ref 9–20)
BUN: 20 mg/dL (ref 6–24)
CO2: 26 mmol/L (ref 20–29)
Calcium: 9 mg/dL (ref 8.7–10.2)
Chloride: 103 mmol/L (ref 96–106)
Creatinine, Ser: 1.05 mg/dL (ref 0.76–1.27)
Glucose: 98 mg/dL (ref 70–99)
Potassium: 4.7 mmol/L (ref 3.5–5.2)
Sodium: 140 mmol/L (ref 134–144)
eGFR: 62 mL/min/1.73 (ref >59)

## 2024-02-06 LAB — TROPONIN T: Troponin T (Highly Sensitive): 19 ng/L (ref 0–22)

## 2024-02-06 MED ORDER — FUROSEMIDE 40 MG PO TABS: ORAL_TABLET | ORAL | 0 refills | Status: AC

## 2024-02-06 MED ORDER — POTASSIUM CHLORIDE CRYS ER 20 MEQ PO TBCR: EXTENDED_RELEASE_TABLET | ORAL | 0 refills | Status: AC

## 2024-02-06 NOTE — Patient Instructions (Signed)
 Medication Instructions:  START LASIX  40 MG BY MOUTH DAILY FOR 3 DAYS ONLY, THEN DECREASE TO TAKING 40 MG BY MOUTH DAILY AS NEEDED THERE AFTER. START POTASSIUM CHLORIDE  20 MEQ BY MOUTH DAILY FOR 3 DAYS ONLY, THEN DECREASE TO TAKING 20 MEQ DAILY AS NEEDED WITH LASIX  ADMINISTRATION.  *If you need a refill on your cardiac medications before your next appointment, please call your pharmacy*  Lab Work: TODAY STAT TROPONIN, PRO-BNP, AND BMET  LAB IS ON THE FIRST FLOOR If you have labs (blood work) drawn today and your tests are completely normal, you will receive your results only by: MyChart Message (if you have MyChart) OR A paper copy in the mail If you have any lab test that is abnormal or we need to change your treatment, we will call you to review the results.  Testing/Procedures: Your physician has requested that you have an echocardiogram. Echocardiography is a painless test that uses sound waves to create images of your heart. It provides your doctor with information about the size and shape of your heart and how well your heart's chambers and valves are working. This procedure takes approximately one hour. There are no restrictions for this procedure. Please do NOT wear cologne, perfume, aftershave, or lotions (deodorant is allowed). Please arrive 15 minutes prior to your appointment time.  Please note: We ask at that you not bring children with you during ultrasound (echo/ vascular) testing. Due to room size and safety concerns, children are not allowed in the ultrasound rooms during exams. Our front office staff cannot provide observation of children in our lobby area while testing is being conducted. An adult accompanying a patient to their appointment will only be allowed in the ultrasound room at the discretion of the ultrasound technician under special circumstances. We apologize for any inconvenience.    Your physician has requested that you have a lexiscan  myoview . For further  information please visit [email or web address] Please follow instruction sheet, as given.   Follow-Up: At [facility], you and your health needs are our priority.  As part of our continuing mission to provide you with exceptional heart care, our providers are all part of one team.  This team includes your primary Cardiologist (physician) and Advanced Practice Providers or APPs (Physician Assistants and Nurse Practitioners) who all work together to provide you with the care you need, when you need it.  Your next appointment:   1 month(s)  Provider:   ORREN FABRY, PA-C OR ANY APP

## 2024-02-07 ENCOUNTER — Other Ambulatory Visit: Payer: Self-pay | Admitting: Physician Assistant

## 2024-02-07 DIAGNOSIS — I10 Essential (primary) hypertension: Secondary | ICD-10-CM

## 2024-02-07 DIAGNOSIS — I25118 Atherosclerotic heart disease of native coronary artery with other forms of angina pectoris: Secondary | ICD-10-CM

## 2024-02-07 DIAGNOSIS — R600 Localized edema: Secondary | ICD-10-CM

## 2024-02-07 DIAGNOSIS — R0602 Shortness of breath: Secondary | ICD-10-CM

## 2024-02-07 DIAGNOSIS — J42 Unspecified chronic bronchitis: Secondary | ICD-10-CM

## 2024-02-07 DIAGNOSIS — R079 Chest pain, unspecified: Secondary | ICD-10-CM

## 2024-02-07 DIAGNOSIS — R0609 Other forms of dyspnea: Secondary | ICD-10-CM

## 2024-02-07 DIAGNOSIS — Z720 Tobacco use: Secondary | ICD-10-CM

## 2024-02-07 LAB — PRO B NATRIURETIC PEPTIDE: NT-Pro BNP: 148 pg/mL (ref 0–210)

## 2024-02-09 ENCOUNTER — Ambulatory Visit (HOSPITAL_COMMUNITY)
Admission: RE | Admit: 2024-02-09 | Discharge: 2024-02-09 | Disposition: A | Discharge status: Home / Self Care | Source: Ambulatory Visit | Attending: Cardiology | Admitting: Cardiology

## 2024-02-09 ENCOUNTER — Ambulatory Visit (HOSPITAL_COMMUNITY): Admission: RE | Admit: 2024-02-09 | Source: Ambulatory Visit

## 2024-02-09 DIAGNOSIS — R079 Chest pain, unspecified: Secondary | ICD-10-CM | POA: Diagnosis not present

## 2024-02-09 DIAGNOSIS — R600 Localized edema: Secondary | ICD-10-CM | POA: Diagnosis not present

## 2024-02-09 DIAGNOSIS — J42 Unspecified chronic bronchitis: Secondary | ICD-10-CM | POA: Diagnosis not present

## 2024-02-09 DIAGNOSIS — R0609 Other forms of dyspnea: Secondary | ICD-10-CM | POA: Insufficient documentation

## 2024-02-09 DIAGNOSIS — R0602 Shortness of breath: Secondary | ICD-10-CM | POA: Diagnosis not present

## 2024-02-09 DIAGNOSIS — I25118 Atherosclerotic heart disease of native coronary artery with other forms of angina pectoris: Secondary | ICD-10-CM | POA: Diagnosis not present

## 2024-02-09 DIAGNOSIS — I10 Essential (primary) hypertension: Secondary | ICD-10-CM | POA: Diagnosis not present

## 2024-02-09 DIAGNOSIS — Z720 Tobacco use: Secondary | ICD-10-CM | POA: Insufficient documentation

## 2024-02-09 LAB — ECHOCARDIOGRAM COMPLETE
AR max vel: 2.58 cm2
AV Area VTI: 2.61 cm2
AV Area mean vel: 2.42 cm2
AV Mean grad: 3 mmHg
AV Peak grad: 5.5 mmHg
Ao pk vel: 1.17 m/s
Area-P 1/2: 5.02 cm2
Calc EF: 47.1 %
S' Lateral: 3.5 cm
Single Plane A2C EF: 39.7 %
Single Plane A4C EF: 53 %

## 2024-02-13 ENCOUNTER — Telehealth (HOSPITAL_COMMUNITY): Payer: Self-pay | Admitting: *Deleted

## 2024-02-13 ENCOUNTER — Encounter (HOSPITAL_COMMUNITY): Payer: Self-pay | Admitting: *Deleted

## 2024-02-13 NOTE — Telephone Encounter (Signed)
 Instructions for upcoming stress test sent via [employer].  Argentina Bees, RN

## 2024-02-16 NOTE — Telephone Encounter (Signed)
 Due to not being able to reach patient after several attempts, I sent a certified letter to patient's address that he has listed in EPIC.

## 2024-02-19 ENCOUNTER — Ambulatory Visit (HOSPITAL_COMMUNITY)
Admission: RE | Admit: 2024-02-19 | Discharge: 2024-02-19 | Disposition: A | Discharge status: Home / Self Care | Source: Ambulatory Visit | Attending: Cardiology | Admitting: Cardiology

## 2024-02-19 ENCOUNTER — Encounter: Payer: Self-pay | Admitting: Gastroenterology

## 2024-02-19 DIAGNOSIS — R079 Chest pain, unspecified: Secondary | ICD-10-CM | POA: Insufficient documentation

## 2024-02-19 DIAGNOSIS — I10 Essential (primary) hypertension: Secondary | ICD-10-CM | POA: Insufficient documentation

## 2024-02-19 DIAGNOSIS — R600 Localized edema: Secondary | ICD-10-CM | POA: Diagnosis not present

## 2024-02-19 DIAGNOSIS — J42 Unspecified chronic bronchitis: Secondary | ICD-10-CM | POA: Insufficient documentation

## 2024-02-19 DIAGNOSIS — R0609 Other forms of dyspnea: Secondary | ICD-10-CM | POA: Diagnosis not present

## 2024-02-19 DIAGNOSIS — R0602 Shortness of breath: Secondary | ICD-10-CM | POA: Diagnosis not present

## 2024-02-19 DIAGNOSIS — I2510 Atherosclerotic heart disease of native coronary artery without angina pectoris: Secondary | ICD-10-CM | POA: Diagnosis not present

## 2024-02-19 DIAGNOSIS — Z720 Tobacco use: Secondary | ICD-10-CM | POA: Insufficient documentation

## 2024-02-19 DIAGNOSIS — I25118 Atherosclerotic heart disease of native coronary artery with other forms of angina pectoris: Secondary | ICD-10-CM | POA: Diagnosis not present

## 2024-02-19 LAB — MYOCARDIAL PERFUSION IMAGING
LV dias vol: 124 mL (ref 62–150)
LV sys vol: 55 mL (ref 4.2–5.8)
Nuc Stress EF: 56 %
Peak HR: 96 {beats}/min
Rest HR: 77 {beats}/min
Rest Nuclear Isotope Dose: 12.3 mCi
SDS: 0
SRS: 6
SSS: 1
ST Depression (mm): 0 mm
Stress Nuclear Isotope Dose: 38.4 mCi
TID: 1.02

## 2024-02-19 MED ORDER — TECHNETIUM TC 99M TETROFOSMIN IV KIT
38.4000 | PACK | Freq: Once | INTRAVENOUS | Status: AC | PRN
  Administered 2024-02-19: 38.4 via INTRAVENOUS

## 2024-02-19 MED ORDER — REGADENOSON 0.4 MG/5ML IV SOLN
0.4000 mg | Freq: Once | INTRAVENOUS | Status: AC
  Administered 2024-02-19: 0.4 mg via INTRAVENOUS

## 2024-02-19 MED ORDER — REGADENOSON 0.4 MG/5ML IV SOLN
INTRAVENOUS | Status: AC
  Filled 2024-02-19: qty 5

## 2024-02-19 MED ORDER — TECHNETIUM TC 99M TETROFOSMIN IV KIT
12.3000 | PACK | Freq: Once | INTRAVENOUS | Status: AC | PRN
  Administered 2024-02-19: 12.3 via INTRAVENOUS

## 2024-02-21 ENCOUNTER — Ambulatory Visit: Payer: Self-pay | Admitting: Physician Assistant

## 2024-02-22 DIAGNOSIS — Z8249 Family history of ischemic heart disease and other diseases of the circulatory system: Secondary | ICD-10-CM | POA: Diagnosis not present

## 2024-02-22 DIAGNOSIS — I110 Hypertensive heart disease with heart failure: Secondary | ICD-10-CM | POA: Diagnosis not present

## 2024-02-22 DIAGNOSIS — J439 Emphysema, unspecified: Secondary | ICD-10-CM | POA: Diagnosis not present

## 2024-02-22 DIAGNOSIS — E785 Hyperlipidemia, unspecified: Secondary | ICD-10-CM | POA: Diagnosis not present

## 2024-02-22 DIAGNOSIS — I509 Heart failure, unspecified: Secondary | ICD-10-CM | POA: Diagnosis not present

## 2024-02-22 DIAGNOSIS — Z008 Encounter for other general examination: Secondary | ICD-10-CM | POA: Diagnosis not present

## 2024-02-22 DIAGNOSIS — F1021 Alcohol dependence, in remission: Secondary | ICD-10-CM | POA: Diagnosis not present

## 2024-02-22 DIAGNOSIS — J849 Interstitial pulmonary disease, unspecified: Secondary | ICD-10-CM | POA: Diagnosis not present

## 2024-02-22 DIAGNOSIS — I77819 Aortic ectasia, unspecified site: Secondary | ICD-10-CM | POA: Diagnosis not present

## 2024-02-22 DIAGNOSIS — I2510 Atherosclerotic heart disease of native coronary artery without angina pectoris: Secondary | ICD-10-CM | POA: Diagnosis not present

## 2024-02-22 DIAGNOSIS — I700 Atherosclerosis of aorta: Secondary | ICD-10-CM | POA: Diagnosis not present

## 2024-02-22 DIAGNOSIS — K219 Gastro-esophageal reflux disease without esophagitis: Secondary | ICD-10-CM | POA: Diagnosis not present

## 2024-02-23 ENCOUNTER — Other Ambulatory Visit (HOSPITAL_COMMUNITY): Payer: Self-pay

## 2024-02-23 MED ORDER — PREDNISONE 50 MG PO TABS
ORAL_TABLET | ORAL | 0 refills | Status: DC
  Filled 2024-02-23: qty 4, 2d supply, fill #0

## 2024-02-23 NOTE — Telephone Encounter (Signed)
 I spoke with patient and reviewed cath instructions with him and he verbalized understanding. I placed an order for prednisone  to the [facility] pharmacy.     [name]  02/23/2024  You are scheduled for a Cardiac Catheterization on Wednesday, August 13 with Dr. Peter Swaziland.  1. Please arrive at the [facility] (Main Entrance A) at [facility]: [address] [place], [place] [address] at 9:30 AM (This time is 2 hour(s) before your procedure to ensure your preparation).   Free valet parking service is available. You will check in at ADMITTING. The support person will be asked to wait in the waiting room.  It is OK to have someone drop you off and come back when you are ready to be discharged.    Special note: Every effort is made to have your procedure done on time. Please understand that emergencies sometimes delay scheduled procedures.  2. Diet: No solid foods after midnight. You may have clear liquids until you arrive at the hospital.  List of approved liquids water , clear juice, clear tea, black coffee, fruit juices, non-citric and without pulp, carbonated beverages, Gatorade, Kool -Aid, plain Jello-O and plain ice popsicles.   3. Hydration: You need to be well hydrated before your procedure time. You may drink approved liquids (see below) until you arrive at the hospital. On the way to the hospital, please drink a 16-oz (1 plastic bottle) of water .   List of approved liquids water , clear juice, clear tea, black coffee, fruit juices, non-citric and without pulp, carbonated beverages, Gatorade, Kool -Aid, plain Jello-O and plain ice popsicles.   4. Labs: You will need to have blood drawn on , August 8 at [facility] (1st Floor), [address], [place], [place] [address]. You do not need to be fasting.  5. Medication instructions in preparation for your procedure:   Contrast Allergy: Yes, Please take Prednisone  50mg  by mouth  at: Thirteen hours prior to cath 10:30 pm on Tuesday 02/27/24 Seven hours prior to cath 4:30 am on Wednesday 02/28/24 And prior to leaving home please take last dose of Prednisone  50mg  and Benadryl  50mg  by mouth.  On the morning of your procedure, take your Aspirin  81 mg and any morning medicines NOT listed above.  You may use sips of water .  6. Plan to go home the same day, you will only stay overnight if medically necessary. 7. Bring a current list of your medications and current insurance cards. 8. You MUST have a responsible person to drive you home. 9. Someone MUST be with you the first 24 hours after you arrive home or your discharge will be delayed. 10. Please wear clothes that are easy to get on and off and wear slip-on shoes.  Thank you for allowing us  to care for you!   -- [facility]

## 2024-02-23 NOTE — Addendum Note (Signed)
 Addended by: Sandip Power on: 02/23/2024 09:56 AM   Modules accepted: Orders

## 2024-02-23 NOTE — Telephone Encounter (Signed)
 I have scheduled patient to have a LHC on 8/13 at 11:30 with Dr. Swaziland. Orders placed for bmet and cbc.

## 2024-02-23 NOTE — Addendum Note (Signed)
 Addended by: Shakisha Abend on: 02/23/2024 09:11 AM   Modules accepted: Orders

## 2024-02-26 ENCOUNTER — Telehealth: Payer: Self-pay | Admitting: *Deleted

## 2024-02-26 DIAGNOSIS — I25118 Atherosclerotic heart disease of native coronary artery with other forms of angina pectoris: Secondary | ICD-10-CM | POA: Diagnosis not present

## 2024-02-26 DIAGNOSIS — Z01818 Encounter for other preprocedural examination: Secondary | ICD-10-CM | POA: Diagnosis not present

## 2024-02-26 NOTE — Telephone Encounter (Signed)
 Call placed to patient to review instructions/lab-voicemail not set up.

## 2024-02-26 NOTE — Telephone Encounter (Addendum)
 Cardiac Catheterization scheduled at [facility] for: Wednesday February 28, 2024 11:30 AM Arrival time [facility] Main Entrance A at: 9:30 AM  Diet: -Nothing to eat after midnight prior to procedure.  Hydration: -May drink clear liquids until leaving for hospital. Approved liquids: Water , clear tea, black coffee, fruit juices-non-citric and without pulp,Gatorade, plain Jello/popsicles.  Drink 16 oz. bottle of water  on the way to the hospital.  CONTRAST ALLERGY: 13 hour Prednisone  and Benadryl  Prep: 02/27/24 Prednisone  50 mg at 10:30 PM 02/28/24 Prednisone  50 mg at 4:30 AM 02/28/24 Prednisone  50 mg and Benadryl  50 mg just prior to leaving home for hospital Do not drive after taking Benadryl   Medication instructions: -Hold:  Lasix /KCl-AM of procedure -Other usual morning medications can be taken including aspirin  81 mg.  Plan to go home the same day, you will only stay overnight if medically necessary.  You must have responsible adult to drive you home.  Someone must be with you the first 24 hours after you arrive home.

## 2024-02-26 NOTE — Telephone Encounter (Signed)
 Reviewed procedure instructions with patient. Patient gave me verbal permission to call his girlfriend Melinda's phone number [phone])  if unable to reach him at number listed for him

## 2024-02-27 LAB — CBC
Hematocrit: 49.9 % (ref 37.5–51.0)
Hemoglobin: 16.6 g/dL (ref 13.0–17.7)
MCH: 30.2 pg (ref 26.6–33.0)
MCHC: 33.3 g/dL (ref 31.5–35.7)
MCV: 91 fL (ref 79–97)
Platelets: 208 x10E3/uL (ref 150–450)
RBC: 5.5 x10E6/uL (ref 4.14–5.80)
RDW: 13 % (ref 11.6–15.4)
WBC: 8.7 x10E3/uL (ref 3.4–10.8)

## 2024-02-27 LAB — BASIC METABOLIC PANEL WITH GFR
BUN/Creatinine Ratio: 20 (ref 9–20)
BUN: 24 mg/dL (ref 6–24)
CO2: 25 mmol/L (ref 20–29)
Calcium: 9.3 mg/dL (ref 8.7–10.2)
Chloride: 99 mmol/L (ref 96–106)
Creatinine, Ser: 1.18 mg/dL (ref 0.76–1.27)
Glucose: 89 mg/dL (ref 70–99)
Potassium: 4.4 mmol/L (ref 3.5–5.2)
Sodium: 140 mmol/L (ref 134–144)
eGFR: 72 mL/min/1.73 (ref >59)

## 2024-02-28 ENCOUNTER — Ambulatory Visit (HOSPITAL_COMMUNITY)
Admission: RE | Admit: 2024-02-28 | Discharge: 2024-02-28 | Disposition: A | Discharge status: Home / Self Care | Attending: Cardiology | Admitting: Cardiology

## 2024-02-28 ENCOUNTER — Encounter (HOSPITAL_COMMUNITY)
Admission: RE | Disposition: A | Discharge status: Home / Self Care | Payer: Self-pay | Source: Home / Self Care | Attending: Cardiology

## 2024-02-28 ENCOUNTER — Other Ambulatory Visit: Payer: Self-pay

## 2024-02-28 DIAGNOSIS — Z79899 Other long term (current) drug therapy: Secondary | ICD-10-CM | POA: Diagnosis not present

## 2024-02-28 DIAGNOSIS — I25119 Atherosclerotic heart disease of native coronary artery with unspecified angina pectoris: Secondary | ICD-10-CM | POA: Diagnosis not present

## 2024-02-28 DIAGNOSIS — R079 Chest pain, unspecified: Secondary | ICD-10-CM | POA: Diagnosis present

## 2024-02-28 DIAGNOSIS — R600 Localized edema: Secondary | ICD-10-CM | POA: Insufficient documentation

## 2024-02-28 DIAGNOSIS — I252 Old myocardial infarction: Secondary | ICD-10-CM | POA: Diagnosis not present

## 2024-02-28 DIAGNOSIS — J449 Chronic obstructive pulmonary disease, unspecified: Secondary | ICD-10-CM | POA: Insufficient documentation

## 2024-02-28 DIAGNOSIS — Z955 Presence of coronary angioplasty implant and graft: Secondary | ICD-10-CM | POA: Insufficient documentation

## 2024-02-28 DIAGNOSIS — I10 Essential (primary) hypertension: Secondary | ICD-10-CM | POA: Insufficient documentation

## 2024-02-28 DIAGNOSIS — I2510 Atherosclerotic heart disease of native coronary artery without angina pectoris: Secondary | ICD-10-CM

## 2024-02-28 HISTORY — PX: LEFT HEART CATH AND CORONARY ANGIOGRAPHY: CATH118249

## 2024-02-28 SURGERY — LEFT HEART CATH AND CORONARY ANGIOGRAPHY
Anesthesia: LOCAL

## 2024-02-28 MED ORDER — IOHEXOL 350 MG/ML SOLN
INTRAVENOUS | Status: DC | PRN
  Administered 2024-02-28 (×2): 55 mL

## 2024-02-28 MED ORDER — ASPIRIN 81 MG PO CHEW: 81.0000 mg | CHEWABLE_TABLET | ORAL | Status: DC

## 2024-02-28 MED ORDER — FENTANYL CITRATE (PF) 100 MCG/2ML IJ SOLN
INTRAMUSCULAR | Status: AC
  Filled 2024-02-28: qty 2

## 2024-02-28 MED ORDER — VERAPAMIL HCL 2.5 MG/ML IV SOLN
INTRAVENOUS | Status: DC | PRN
  Administered 2024-02-28 (×2): 10 mL via INTRA_ARTERIAL

## 2024-02-28 MED ORDER — ACETAMINOPHEN 325 MG PO TABS: 650.0000 mg | ORAL_TABLET | ORAL | Status: DC | PRN

## 2024-02-28 MED ORDER — FENTANYL CITRATE (PF) 100 MCG/2ML IJ SOLN
INTRAMUSCULAR | Status: DC | PRN
Start: 2024-02-28 — End: 2024-02-28
  Administered 2024-02-28 (×2): 25 ug via INTRAVENOUS

## 2024-02-28 MED ORDER — ONDANSETRON HCL 4 MG/2ML IJ SOLN: 4.0000 mg | Freq: Four times a day (QID) | INTRAMUSCULAR | Status: DC | PRN

## 2024-02-28 MED ORDER — HYDRALAZINE HCL 20 MG/ML IJ SOLN
10.0000 mg | INTRAMUSCULAR | Status: DC | PRN
  Administered 2024-02-28 (×4): 10 mg via INTRAVENOUS
  Filled 2024-02-28 (×2): qty 1

## 2024-02-28 MED ORDER — SODIUM CHLORIDE 0.9% FLUSH: 3.0000 mL | INTRAVENOUS | Status: DC | PRN

## 2024-02-28 MED ORDER — LIDOCAINE HCL (PF) 1 % IJ SOLN
INTRAMUSCULAR | Status: AC
  Filled 2024-02-28: qty 30

## 2024-02-28 MED ORDER — SODIUM CHLORIDE 0.9% FLUSH
3.0000 mL | INTRAVENOUS | Status: DC | PRN
Start: 2024-02-28 — End: 2024-02-28

## 2024-02-28 MED ORDER — MIDAZOLAM HCL 2 MG/2ML IJ SOLN
INTRAMUSCULAR | Status: AC
  Filled 2024-02-28: qty 2

## 2024-02-28 MED ORDER — NITROGLYCERIN 0.4 MG SL SUBL
SUBLINGUAL_TABLET | SUBLINGUAL | Status: AC
Start: 2024-02-28 — End: 2024-02-28
  Administered 2024-02-28 (×2): 0.4 mg via SUBLINGUAL
  Filled 2024-02-28: qty 3

## 2024-02-28 MED ORDER — SODIUM CHLORIDE 0.9 % IV SOLN: 250.0000 mL | INTRAVENOUS | Status: DC | PRN

## 2024-02-28 MED ORDER — SODIUM CHLORIDE 0.9 % IV SOLN
250.0000 mL | INTRAVENOUS | Status: DC | PRN
Start: 2024-02-28 — End: 2024-02-28

## 2024-02-28 MED ORDER — SODIUM CHLORIDE 0.9% FLUSH: 3.0000 mL | Freq: Two times a day (BID) | INTRAVENOUS | Status: DC

## 2024-02-28 MED ORDER — VERAPAMIL HCL 2.5 MG/ML IV SOLN
INTRAVENOUS | Status: AC
  Filled 2024-02-28: qty 2

## 2024-02-28 MED ORDER — HEPARIN SODIUM (PORCINE) 1000 UNIT/ML IJ SOLN
INTRAMUSCULAR | Status: AC
Start: 2024-02-28 — End: 2024-02-28
  Filled 2024-02-28: qty 10

## 2024-02-28 MED ORDER — NITROGLYCERIN 0.4 MG SL SUBL: 0.4000 mg | SUBLINGUAL_TABLET | SUBLINGUAL | Status: DC | PRN

## 2024-02-28 MED ORDER — LIDOCAINE HCL (PF) 1 % IJ SOLN
INTRAMUSCULAR | Status: DC | PRN
  Administered 2024-02-28 (×2): 2 mL

## 2024-02-28 MED ORDER — FUROSEMIDE 10 MG/ML IJ SOLN
20.0000 mg | Freq: Once | INTRAMUSCULAR | Status: AC
  Administered 2024-02-28 (×2): 20 mg via INTRAVENOUS
  Filled 2024-02-28: qty 4

## 2024-02-28 MED ORDER — HEPARIN SODIUM (PORCINE) 1000 UNIT/ML IJ SOLN
INTRAMUSCULAR | Status: DC | PRN
  Administered 2024-02-28 (×2): 6000 [IU] via INTRAVENOUS

## 2024-02-28 MED ORDER — HEPARIN (PORCINE) IN NACL 1000-0.9 UT/500ML-% IV SOLN
INTRAVENOUS | Status: DC | PRN
  Administered 2024-02-28 (×4): 500 mL

## 2024-02-28 MED ORDER — FREE WATER
250.0000 mL | Freq: Once | Status: AC
  Administered 2024-02-28 (×2): 250 mL via ORAL

## 2024-02-28 MED ORDER — MIDAZOLAM HCL 2 MG/2ML IJ SOLN
INTRAMUSCULAR | Status: DC | PRN
  Administered 2024-02-28 (×2): 1 mg via INTRAVENOUS

## 2024-02-28 MED ORDER — FREE WATER
500.0000 mL | Freq: Once | Status: AC
  Administered 2024-02-28 (×2): 500 mL via ORAL

## 2024-02-28 SURGICAL SUPPLY — 10 items
CATH 5FR JL3.5 JR4 ANG PIG MP (CATHETERS) IMPLANT
DEVICE RAD COMP TR BAND LRG (VASCULAR PRODUCTS) IMPLANT
ELECT DEFIB PAD ADLT CADENCE (PAD) IMPLANT
GLIDESHEATH SLEND SS 6F .021 (SHEATH) IMPLANT
GUIDEWIRE INQWIRE 1.5J.035X260 (WIRE) IMPLANT
KIT SINGLE USE MANIFOLD (KITS) IMPLANT
PACK CARDIAC CATHETERIZATION (CUSTOM PROCEDURE TRAY) ×2 IMPLANT
SET ATX-X65L (MISCELLANEOUS) IMPLANT
SHEATH PROBE COVER 6X72 (BAG) IMPLANT
STATION PROTECTION PRESSURIZED (MISCELLANEOUS) IMPLANT

## 2024-02-28 NOTE — Discharge Instructions (Signed)

## 2024-02-28 NOTE — Interval H&P Note (Signed)
 History and Physical Interval Note:  02/28/2024 10:34 AM  [name]  has presented today for surgery, with the diagnosis of cad.  The various methods of treatment have been discussed with the patient and family. After consideration of risks, benefits and other options for treatment, the patient has consented to  Procedure(s): LEFT HEART CATH AND CORONARY ANGIOGRAPHY (N/A) as a surgical intervention.  The patient's history has been reviewed, patient examined, no change in status, stable for surgery.  I have reviewed the patient's chart and labs.  Questions were answered to the patient's satisfaction.   Cath Lab Visit (complete for each Cath Lab visit)  Clinical Evaluation Leading to the Procedure:   ACS: No.  Non-ACS:    Anginal Classification: CCS III  Anti-ischemic medical therapy: No Therapy  Non-Invasive Test Results: Low-risk stress test findings: cardiac mortality <1%/year  Prior CABG: No previous CABG        [facility] 02/28/2024 10:34 AM

## 2024-02-28 NOTE — Progress Notes (Signed)
 After patient arrived in short stay room, he c/o chest pain 5/10. States activity makes chest pain worse. 2L  O2 placed. 1 SL nitro was given and chest pain down to 0. Dr Swaziland to bedside pre-cath. EKG completed.

## 2024-02-29 ENCOUNTER — Encounter (HOSPITAL_COMMUNITY): Payer: Self-pay | Admitting: Cardiology

## 2024-03-05 ENCOUNTER — Ambulatory Visit: Payer: Self-pay

## 2024-03-05 NOTE — Telephone Encounter (Signed)
 Noted I will assess him then

## 2024-03-05 NOTE — Telephone Encounter (Signed)
 FYI Only or Action Required?: Action required by provider: update on patient condition.  Patient was last seen in primary care on 02/02/2024 by Wendee Lynwood HERO, NP.  Called Nurse Triage reporting Hypertension.  Symptoms began a week ago.  Interventions attempted: Prescription medications: amlodipine  using prn.  Symptoms are: unchanged.  Triage Disposition: See Physician Within 24 Hours  Patient/caregiver understands and will follow disposition?: Yes  Copied from CRM #[id number]. Topic: Clinical - Red Word Triage >> Mar 05, 2024 12:20 PM Dedra B wrote: Red Word that prompted transfer to Nurse Triage: Pt called in saying that his BP has been 200+/110-120. He is currently on amlodipine . Reason for Disposition  Systolic BP >= 180 OR Diastolic >= 110  Answer Assessment - Initial Assessment Questions 1. BLOOD PRESSURE: What is your blood pressure? Did you take at least two measurements 5 minutes apart?     193/107 took amlodipine  short time ago 2. ONSET: When did you take your blood pressure?     Elevated blood pressure since procedure on 02/28/24 3. HOW: How did you take your blood pressure? (e.g., automatic home BP monitor, visiting nurse)     Home cuff 4. HISTORY: Do you have a history of high blood pressure?     yes 5. MEDICINES: Are you taking any medicines for blood pressure? Have you missed any doses recently?     Amlodipine , he only takes if his blood pressure is elevated.  6. OTHER SYMPTOMS: Do you have any symptoms? (e.g., blurred vision, chest pain, difficulty breathing, headache, weakness)    Ongoing mild headache since procedure last week. No new symptoms. Denies all other symptoms   Additional info: Had cardiac procedure 02/28/24, feels he needs med adjustment, amlodipine  does not seem to be working after procedure.  Protocols used: Blood Pressure - High-A-AH

## 2024-03-06 ENCOUNTER — Encounter: Payer: Self-pay | Admitting: Internal Medicine

## 2024-03-06 ENCOUNTER — Ambulatory Visit: Admitting: Internal Medicine

## 2024-03-06 VITALS — BP 166/90 | HR 100 | Temp 98.1°F | Ht 70.0 in | Wt 274.0 lb

## 2024-03-06 DIAGNOSIS — I10 Essential (primary) hypertension: Secondary | ICD-10-CM

## 2024-03-06 MED ORDER — CHLORTHALIDONE 25 MG PO TABS: 25.0000 mg | ORAL_TABLET | Freq: Every day | ORAL | 3 refills | Status: DC

## 2024-03-06 NOTE — Assessment & Plan Note (Signed)
 BP Readings from Last 3 Encounters:  03/06/24 (!) 166/90  02/28/24 (!) 166/106  02/06/24 (!) 146/76   Doesn't add salt but lots of processed foods, etc----discussed Needs to sleep better On amlodipine  Past chlorthalidone --did well with it but stopped when BP better---will restart Would be good candidate for beta blocker with his CAD and rate---but will defer to next time
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
## 2024-03-13 ENCOUNTER — Ambulatory Visit: Payer: Self-pay

## 2024-03-13 NOTE — Telephone Encounter (Signed)
 FYI Only or Action Required?: Action required by provider: was seen last week and new bp meds is not working, he's leaving for a cruise on Sat and needs to get this undercontrol.  Patient was last seen in primary care on 03/06/2024 by Jimmy Charlie FERNS, MD.  Called Nurse Triage reporting Hypertension.  Symptoms began a week ago.  Interventions attempted: Nothing.  Symptoms are: gradually worsening.  Triage Disposition: Go to ED Now (Notify PCP)  Patient/caregiver understands and will follow disposition?: Yes     Copied from CRM [id number]. Topic: Clinical - Red Word Triage >> Mar 13, 2024  3:20 PM Sasha M wrote: Red Word that prompted transfer to Nurse Triage: pt is calling in due to hbp 197/107 and hour ago. He states he seen dr jimmy last week and he put him on a secondary medication but it doesn't seem to be helping. Reason for Disposition  [1] Systolic BP >= 160 OR Diastolic >= 100 AND [2] cardiac (e.g., breathing difficulty, chest pain) or neurologic symptoms (e.g., new-onset blurred or double vision, unsteady gait)  Answer Assessment - Initial Assessment Questions 1. BLOOD PRESSURE: What is your blood pressure? Did you take at least two measurements 5 minutes apart?     197/107 was seen in office last week and given medication and doesn't seem to be working 2. ONSET: When did you take your blood pressure?     today 3. HOW: How did you take your blood pressure? (e.g., automatic home BP monitor, visiting nurse)     Automatic cuff 4. HISTORY: Do you have a history of high blood pressure?     yes 5. MEDICINES: Are you taking any medicines for blood pressure? Have you missed any doses recently?     No  6. OTHER SYMPTOMS: Do you have any symptoms? (e.g., blurred vision, chest pain, difficulty breathing, headache, weakness)     no 7. PREGNANCY: Is there any chance you are pregnant? When was your last menstrual period?     na  Protocols used: Blood Pressure -  High-A-AH

## 2024-03-15 ENCOUNTER — Ambulatory Visit (AMBULATORY_SURGERY_CENTER): Admitting: *Deleted

## 2024-03-15 ENCOUNTER — Ambulatory Visit (INDEPENDENT_AMBULATORY_CARE_PROVIDER_SITE_OTHER): Admitting: Family Medicine

## 2024-03-15 ENCOUNTER — Encounter: Payer: Self-pay | Admitting: Gastroenterology

## 2024-03-15 ENCOUNTER — Encounter: Payer: Self-pay | Admitting: Family Medicine

## 2024-03-15 VITALS — Ht 70.0 in | Wt 250.0 lb

## 2024-03-15 VITALS — BP 160/90 | HR 94 | Temp 98.3°F | Ht 70.0 in | Wt 268.4 lb

## 2024-03-15 DIAGNOSIS — Z720 Tobacco use: Secondary | ICD-10-CM

## 2024-03-15 DIAGNOSIS — Z860100 Personal history of colon polyps, unspecified: Secondary | ICD-10-CM

## 2024-03-15 DIAGNOSIS — R252 Cramp and spasm: Secondary | ICD-10-CM | POA: Diagnosis not present

## 2024-03-15 DIAGNOSIS — I10 Essential (primary) hypertension: Secondary | ICD-10-CM | POA: Diagnosis not present

## 2024-03-15 LAB — BASIC METABOLIC PANEL WITH GFR
BUN: 22 mg/dL (ref 6–23)
CO2: 34 meq/L — ABNORMAL HIGH (ref 19–32)
Calcium: 9.2 mg/dL (ref 8.4–10.5)
Chloride: 96 meq/L (ref 96–112)
Creatinine, Ser: 1.22 mg/dL (ref 0.40–1.50)
GFR: 65.44 mL/min (ref >60.00)
Glucose, Bld: 112 mg/dL — ABNORMAL HIGH (ref 70–99)
Potassium: 3.8 meq/L (ref 3.5–5.1)
Sodium: 139 meq/L (ref 135–145)

## 2024-03-15 LAB — CBC WITH DIFFERENTIAL/PLATELET
Basophils Absolute: 0 K/uL (ref 0.0–0.1)
Basophils Relative: 0.5 % (ref 0.0–3.0)
Eosinophils Absolute: 0.3 K/uL (ref 0.0–0.7)
Eosinophils Relative: 3.5 % (ref 0.0–5.0)
HCT: 46.6 % (ref 39.0–52.0)
Hemoglobin: 15.7 g/dL (ref 13.0–17.0)
Lymphocytes Relative: 22.2 % (ref 12.0–46.0)
Lymphs Abs: 1.6 K/uL (ref 0.7–4.0)
MCHC: 33.6 g/dL (ref 30.0–36.0)
MCV: 88.6 fl (ref 78.0–100.0)
Monocytes Absolute: 0.6 K/uL (ref 0.1–1.0)
Monocytes Relative: 7.8 % (ref 3.0–12.0)
Neutro Abs: 4.8 K/uL (ref 1.4–7.7)
Neutrophils Relative %: 66 % (ref 43.0–77.0)
Platelets: 189 K/uL (ref 150.0–400.0)
RBC: 5.26 Mil/uL (ref 4.22–5.81)
RDW: 13.9 % (ref 11.5–15.5)
WBC: 7.3 K/uL (ref 4.0–10.5)

## 2024-03-15 LAB — TSH: TSH: 1.76 u[IU]/mL (ref 0.35–5.50)

## 2024-03-15 LAB — MAGNESIUM: Magnesium: 2 mg/dL (ref 1.5–2.5)

## 2024-03-15 MED ORDER — LOSARTAN POTASSIUM 50 MG PO TABS: 50.0000 mg | ORAL_TABLET | Freq: Every day | ORAL | 0 refills | Status: DC

## 2024-03-15 MED ORDER — AMLODIPINE BESYLATE 10 MG PO TABS: 10.0000 mg | ORAL_TABLET | Freq: Every day | ORAL | 0 refills | Status: AC

## 2024-03-15 MED ORDER — NA SULFATE-K SULFATE-MG SULF 17.5-3.13-1.6 GM/177ML PO SOLN: 1.0000 | Freq: Once | ORAL | 0 refills | Status: AC

## 2024-03-15 NOTE — Progress Notes (Signed)
 Ph: [phone] Fax: [phone]   Patient ID: [name], male    DOB: [birth year], 58 y.o.   MRN: [id number]  This visit was conducted in person.  BP (!) 160/90 (BP Location: Left Arm, Cuff Size: Large)   Pulse 94   Temp 98.3 F (36.8 C) (Oral)   Ht 5' 10 (1.778 m)   Wt 268 lb 6 oz (121.7 kg)   SpO2 94%   BMI 38.51 kg/m   BP Readings from Last 3 Encounters:  03/15/24 (!) 160/90  03/06/24 (!) 166/90  02/28/24 (!) 166/106  160/90 on recheck, L arm  CC: HTN f/u visit  Subjective:   HPI: [name] is a 58 y.o. male presenting on 03/15/2024 for Medical Management of Chronic Issues (Pt here for f/u HTN)   See prior notes for details.  Upcoming colonoscopy.  Known hypertensive, recent issues with hypertension up to 200/100s. States this has been worse since heart catheterization procedure 02/28/2024 (see below). Normally on amlodipine  10mg  daily, last visit chlorthalidone  25mg  started.  He also takes lasix  40mg  PRN with potassium - has been taking daily. 6 lb weight loss since last week, chlorthalidone  commencement.   Brings home BP readings - 170-200/90-110s States home cuff is accurate.   No HA, vision changes, CP/tightness Notes chronic dyspnea from COPD Notes chronic leg swelling.  No h/o stroke but has had MI x2 with 2 stents.   Lisinopril  may have caused cough, but no angioedema.  Notes bad cramps for several months. Not on statin. Treating with mustard.   Leaving for cruise tomorrow - flying out of [place] to [place], then [place], [place], with return planned in 1 week.   No significant alcohol intake Denies rec drugs  Smoking - down to <1/2 ppd - he's working on cutting back.   LHC 02/28/2024:   Prox LAD lesion is 45% stenosed.   Non-stenotic Mid Cx lesion was previously treated.   Non-stenotic Ramus lesion was previously treated.   LV end diastolic pressure is moderately elevated. Nonobstructive CAD. Prior stents are patent. Modest LAD disease but no  change from prior and previous FFR was normal. Recent normal Myoview  LVEDP 25 mm Hg Plan: medical management.      Relevant past medical, surgical, family and social history reviewed and updated as indicated. Interim medical history since our last visit reviewed. Allergies and medications reviewed and updated. Outpatient Medications Prior to Visit  Medication Sig Dispense Refill   albuterol  (VENTOLIN  HFA) 108 (90 Base) MCG/ACT inhaler Inhale 2 puffs into the lungs every 6 (six) hours as needed for wheezing or shortness of breath. 8 g 6   aspirin  81 MG EC tablet Take 1 tablet (81 mg total) by mouth daily. Swallow whole. 30 tablet 2   budesonide -formoterol  (BREYNA ) 160-4.5 MCG/ACT inhaler Inhale 2 puffs into the lungs 2 (two) times daily.     EPINEPHRINE  0.3 mg/0.3 mL IJ SOAJ injection INJECT 0.3 MLS (0.3 MG TOTAL) INTO THE MUSCLE AS NEEDED FOR ANAPHYLAXIS. 2 each 0   furosemide  (LASIX ) 40 MG tablet Take 1 tablet (40 mg total) by mouth daily for 3 days, then decrease to taking 1 tablet (40 mg total) by mouth daily as needed for lower extremity swelling. 30 tablet 0   nitroGLYCERIN  (NITROSTAT ) 0.4 MG SL tablet DISSOLVE ONE TABLET UNDER THE TONGUE EVERY 5 MINUTES AS NEEDED FOR CHEST PAIN.  DO NOT EXCEED A TOTAL OF 3 DOSES IN 15 MINUTES 25 tablet 11   potassium chloride  SA (KLOR-CON   M) 20 MEQ tablet Take 1 tablet (20 mEq total) by mouth daily for 3 days. Then decrease to taking 1 tablet (20 mEq total) daily as needed with Lasix  administration. 30 tablet 0   Tiotropium Bromide Monohydrate  (SPIRIVA  RESPIMAT) 2.5 MCG/ACT AERS Inhale 2 puffs into the lungs daily.     amLODipine  (NORVASC ) 10 MG tablet Take 0.5 tablets (5 mg total) by mouth daily. 90 tablet 1   chlorthalidone  (HYGROTON ) 25 MG tablet Take 1 tablet (25 mg total) by mouth daily. 90 tablet 3   No facility-administered medications prior to visit.     Per HPI unless specifically indicated in ROS section below Review of Systems  Objective:   BP (!) 160/90 (BP Location: Left Arm, Cuff Size: Large)   Pulse 94   Temp 98.3 F (36.8 C) (Oral)   Ht 5' 10 (1.778 m)   Wt 268 lb 6 oz (121.7 kg)   SpO2 94%   BMI 38.51 kg/m   Wt Readings from Last 3 Encounters:  03/15/24 268 lb 6 oz (121.7 kg)  03/15/24 250 lb (113.4 kg)  03/06/24 274 lb (124.3 kg)      Physical Exam Vitals and nursing note reviewed.  Constitutional:      Appearance: Normal appearance. He is not ill-appearing.  HENT:     Head: Normocephalic and atraumatic.     Mouth/Throat:     Mouth: Mucous membranes are dry.     Pharynx: Oropharynx is clear. No oropharyngeal exudate or posterior oropharyngeal erythema.  Eyes:     Extraocular Movements: Extraocular movements intact.     Conjunctiva/sclera: Conjunctivae normal.     Pupils: Pupils are equal, round, and reactive to light.  Neck:     Thyroid : No thyroid  mass or thyromegaly.  Cardiovascular:     Rate and Rhythm: Normal rate and regular rhythm.     Pulses: Normal pulses.     Heart sounds: Normal heart sounds. No murmur heard. Pulmonary:     Effort: Pulmonary effort is normal. No respiratory distress.     Breath sounds: Normal breath sounds. No wheezing, rhonchi or rales.  Musculoskeletal:        General: No swelling.     Cervical back: Normal range of motion and neck supple.     Right lower leg: Edema present.     Left lower leg: Edema present.     Comments: Chronic taut LE swelling  Skin:    General: Skin is warm and dry.     Findings: No rash.  Neurological:     General: No focal deficit present.     Mental Status: He is alert.     Comments: Intermittent tremor present to R hand  Psychiatric:        Mood and Affect: Mood normal.        Behavior: Behavior normal.        Lab Results  Component Value Date   NA 139 03/15/2024   CL 96 03/15/2024   K 3.8 03/15/2024   CO2 34 (H) 03/15/2024   BUN 22 03/15/2024   CREATININE 1.22 03/15/2024   EGFR 72 02/26/2024   CALCIUM  9.2 03/15/2024    PHOS 4.1 02/27/2016   ALBUMIN  4.0 06/13/2023   GLUCOSE 112 (H) 03/15/2024   Lab Results  Component Value Date   TSH 1.76 03/15/2024   Lab Results  Component Value Date   WBC 7.3 03/15/2024   HGB 15.7 03/15/2024   HCT 46.6 03/15/2024   MCV 88.6 03/15/2024  PLT 189.0 03/15/2024    Assessment & Plan:   Problem List Items Addressed This Visit     Hypertension - Primary (Chronic)   Chronic issue, acutely worse over the past few weeks, endorses since cardiac catheterization.  This is despite regular use of amlodipine , lasix , and most recently daily chlorthalidone  use.  Given already on lasix , will stop chlorthalidone , start losartan  50mg  daily. Consider BB in CAD hx.  Will also further evaluation with labwork (CBC, BMP, TSH).  H/o cocaine use, denies current recreational drug use.  Reviewed limiting salt/sodium intake, encourage good water  intake.  DASH diet handout provided.  Discussed caution with alcohol intake at upcoming cruise.  He already has cardiology and PCP f/u scheduled for next month       Relevant Medications   amLODipine  (NORVASC ) 10 MG tablet   losartan  (COZAAR ) 50 MG tablet   Other Relevant Orders   TSH (Completed)   CBC with Differential/Platelet (Completed)   Basic metabolic panel with GFR (Completed)   Magnesium  (Completed)   Tobacco abuse   Continue to encourage smoking cessation - down to <1/2 ppd.       Severe obesity (BMI 35.0-39.9) with comorbidity (HCC)   Muscle cramps   Notes worsening muscle cramps for months.  Has been treating with mustard - discussed frequent mustard intake could contribute to hypertension.  Check magnesium  levels today.  Not on statin.         Meds ordered this encounter  Medications   amLODipine  (NORVASC ) 10 MG tablet    Sig: Take 1 tablet (10 mg total) by mouth daily.    Dispense:  90 tablet    Refill:  0   losartan  (COZAAR ) 50 MG tablet    Sig: Take 1 tablet (50 mg total) by mouth daily.    Dispense:  90  tablet    Refill:  0    Orders Placed This Encounter  Procedures   TSH   CBC with Differential/Platelet   Basic metabolic panel with GFR   Magnesium     Patient Instructions  Work on low salt/sodium diet - goal <2 grams (2,000mg ) per day. Eat a diet high in fruits/vegetables and whole grains.  Look into mediterranean and DASH diet. Goal activity is 185min/wk of moderate intensity exercise.  This can be split into 30 minute chunks.  If you are not at this level, you can start with smaller 10-15 min increments and slowly build up activity. Look at www.heart.org for more resources   BP regimen: Amlodipine  10mg  daily Lasix  40mg  daily NEW - Losartan  50mg  daily  STOP chlorthalidone .  Continue monitoring blood pressures at home - goal is <140/90 Update us  with readings.  Caution with salt/sodium, processed foods.   Follow up plan: No follow-ups on file.  Anton Blas, MD

## 2024-03-15 NOTE — Progress Notes (Signed)
 Pre visit completed over telephone. Instructions to be mailed to patient's home address.  Patient had cardiac cath 2 weeks ago that revealed patent stents and non-obstructive CAD.  Patient seeing PMD today for blood pressure management     No egg or soy allergy known to patient  No issues known to pt with past sedation with any surgeries or procedures Patient denies ever being told they had issues or difficulty with intubation  No FH of Malignant Hyperthermia Pt is not on diet pills Pt is not on  home 02  Pt is not on blood thinners  Pt denies issues with constipation  No A fib or A flutter Have any cardiac testing pending-- NO Pt instructed to use Single[employer].com or GoodRx for a price reduction on prep

## 2024-03-15 NOTE — Patient Instructions (Addendum)
 Work on low salt/sodium diet - goal <2 grams (2,000mg ) per day. Eat a diet high in fruits/vegetables and whole grains.  Look into mediterranean and DASH diet. Goal activity is 142min/wk of moderate intensity exercise.  This can be split into 30 minute chunks.  If you are not at this level, you can start with smaller 10-15 min increments and slowly build up activity. Look at www.heart.org for more resources   BP regimen: Amlodipine  10mg  daily Lasix  40mg  daily NEW - Losartan  50mg  daily  STOP chlorthalidone .  Continue monitoring blood pressures at home - goal is <140/90 Update us  with readings.  Caution with salt/sodium, processed foods.

## 2024-03-16 ENCOUNTER — Ambulatory Visit: Payer: Self-pay | Admitting: Family Medicine

## 2024-03-16 DIAGNOSIS — R252 Cramp and spasm: Secondary | ICD-10-CM | POA: Insufficient documentation

## 2024-03-16 NOTE — Assessment & Plan Note (Signed)
Continue to encourage smoking cessation down to <1/2 ppd

## 2024-03-16 NOTE — Assessment & Plan Note (Addendum)
 Chronic issue, acutely worse over the past few weeks, endorses since cardiac catheterization.  This is despite regular use of amlodipine , lasix , and most recently daily chlorthalidone  use.  Given already on lasix , will stop chlorthalidone , start losartan  50mg  daily. Consider BB in CAD hx.  Will also further evaluation with labwork (CBC, BMP, TSH).  H/o cocaine use, denies current recreational drug use.  Reviewed limiting salt/sodium intake, encourage good water  intake.  DASH diet handout provided.  Discussed caution with alcohol intake at upcoming cruise.  He already has cardiology and PCP f/u scheduled for next month

## 2024-03-16 NOTE — Assessment & Plan Note (Addendum)
 Notes worsening muscle cramps for months.  Has been treating with mustard - discussed frequent mustard intake could contribute to hypertension.  Check magnesium  levels today.  Not on statin.

## 2024-03-26 ENCOUNTER — Telehealth: Payer: Self-pay | Admitting: *Deleted

## 2024-03-26 NOTE — Telephone Encounter (Signed)
-----   Message from Reggy Salt sent at 02/21/2024  3:17 PM EDT ----- See cardiology message- please check on patient. If he needs to be seen before scheduled date late September, please see if he can come to a held spot with me or to an APP.-CDY ----- Message ----- From: Lucien Orren LOISE DEVONNA Sent: 02/06/2024  12:37 PM EDT To: Reggy JONETTA Salt, MD  Hi Dr. Salt, I saw our mutual patient today [name].  He had a bout of chest pain yesterday and is fluid overloaded so we have ordered some testing for workup in addition to labs.  He also was wheezing pretty significantly.  I know he is on a few inhaled medications for his COPD but I think he needs to see you before September.  We are seeing him back in a month to review his stress test and echocardiogram.  I have prescribed Lasix  as well to help with his fluid overload which should help his breathing some.  I do think his COPD medications can be optimized.  Thank you so much for your help with this patient.  Orren LOISE Lucien, PA-C

## 2024-03-26 NOTE — Telephone Encounter (Signed)
 Spoke with the pt He states that he just returned from a 7 day cruise and feels great  He feels comfortable keeping his appt with CY for 04/11/24  Will call sooner if needed  Nothing further needed

## 2024-03-30 ENCOUNTER — Other Ambulatory Visit: Payer: Self-pay

## 2024-03-30 ENCOUNTER — Emergency Department

## 2024-03-30 ENCOUNTER — Inpatient Hospital Stay
Admission: EM | Admit: 2024-03-30 | Discharge: 2024-03-31 | DRG: 190 | Disposition: A | Discharge status: Home / Self Care | Attending: Student in an Organized Health Care Education/Training Program | Admitting: Student in an Organized Health Care Education/Training Program

## 2024-03-30 DIAGNOSIS — Z823 Family history of stroke: Secondary | ICD-10-CM

## 2024-03-30 DIAGNOSIS — I10 Essential (primary) hypertension: Secondary | ICD-10-CM | POA: Diagnosis not present

## 2024-03-30 DIAGNOSIS — I25119 Atherosclerotic heart disease of native coronary artery with unspecified angina pectoris: Secondary | ICD-10-CM | POA: Diagnosis present

## 2024-03-30 DIAGNOSIS — I340 Nonrheumatic mitral (valve) insufficiency: Secondary | ICD-10-CM | POA: Diagnosis not present

## 2024-03-30 DIAGNOSIS — R0603 Acute respiratory distress: Secondary | ICD-10-CM | POA: Diagnosis not present

## 2024-03-30 DIAGNOSIS — Z8614 Personal history of Methicillin resistant Staphylococcus aureus infection: Secondary | ICD-10-CM

## 2024-03-30 DIAGNOSIS — Z79899 Other long term (current) drug therapy: Secondary | ICD-10-CM | POA: Diagnosis not present

## 2024-03-30 DIAGNOSIS — F17200 Nicotine dependence, unspecified, uncomplicated: Secondary | ICD-10-CM | POA: Diagnosis not present

## 2024-03-30 DIAGNOSIS — I2510 Atherosclerotic heart disease of native coronary artery without angina pectoris: Secondary | ICD-10-CM | POA: Diagnosis present

## 2024-03-30 DIAGNOSIS — Z91040 Latex allergy status: Secondary | ICD-10-CM | POA: Diagnosis not present

## 2024-03-30 DIAGNOSIS — Z716 Tobacco abuse counseling: Secondary | ICD-10-CM | POA: Diagnosis not present

## 2024-03-30 DIAGNOSIS — J9601 Acute respiratory failure with hypoxia: Secondary | ICD-10-CM | POA: Diagnosis not present

## 2024-03-30 DIAGNOSIS — G90522 Complex regional pain syndrome I of left lower limb: Secondary | ICD-10-CM | POA: Diagnosis present

## 2024-03-30 DIAGNOSIS — Z981 Arthrodesis status: Secondary | ICD-10-CM | POA: Diagnosis not present

## 2024-03-30 DIAGNOSIS — Z955 Presence of coronary angioplasty implant and graft: Secondary | ICD-10-CM | POA: Diagnosis not present

## 2024-03-30 DIAGNOSIS — Z87442 Personal history of urinary calculi: Secondary | ICD-10-CM | POA: Diagnosis not present

## 2024-03-30 DIAGNOSIS — Z8249 Family history of ischemic heart disease and other diseases of the circulatory system: Secondary | ICD-10-CM

## 2024-03-30 DIAGNOSIS — Z91030 Bee allergy status: Secondary | ICD-10-CM

## 2024-03-30 DIAGNOSIS — J441 Chronic obstructive pulmonary disease with (acute) exacerbation: Principal | ICD-10-CM | POA: Diagnosis present

## 2024-03-30 DIAGNOSIS — G4733 Obstructive sleep apnea (adult) (pediatric): Secondary | ICD-10-CM | POA: Diagnosis not present

## 2024-03-30 DIAGNOSIS — Z7982 Long term (current) use of aspirin: Secondary | ICD-10-CM | POA: Diagnosis not present

## 2024-03-30 DIAGNOSIS — F1011 Alcohol abuse, in remission: Secondary | ICD-10-CM | POA: Diagnosis not present

## 2024-03-30 DIAGNOSIS — E785 Hyperlipidemia, unspecified: Secondary | ICD-10-CM | POA: Diagnosis not present

## 2024-03-30 DIAGNOSIS — Z880 Allergy status to penicillin: Secondary | ICD-10-CM

## 2024-03-30 DIAGNOSIS — Z91041 Radiographic dye allergy status: Secondary | ICD-10-CM | POA: Diagnosis not present

## 2024-03-30 DIAGNOSIS — N182 Chronic kidney disease, stage 2 (mild): Secondary | ICD-10-CM | POA: Diagnosis present

## 2024-03-30 DIAGNOSIS — J439 Emphysema, unspecified: Secondary | ICD-10-CM | POA: Diagnosis not present

## 2024-03-30 DIAGNOSIS — Z91013 Allergy to seafood: Secondary | ICD-10-CM

## 2024-03-30 DIAGNOSIS — Z720 Tobacco use: Secondary | ICD-10-CM | POA: Diagnosis present

## 2024-03-30 DIAGNOSIS — R0602 Shortness of breath: Secondary | ICD-10-CM | POA: Diagnosis not present

## 2024-03-30 HISTORY — DX: Atherosclerotic heart disease of native coronary artery without angina pectoris: I25.10

## 2024-03-30 HISTORY — DX: Chronic obstructive pulmonary disease, unspecified: J44.9

## 2024-03-30 LAB — BLOOD GAS, VENOUS
Acid-Base Excess: 3.7 mmol/L — ABNORMAL HIGH (ref 0.0–2.0)
Bicarbonate: 29.7 mmol/L — ABNORMAL HIGH (ref 20.0–28.0)
O2 Content: 10 L/min
O2 Saturation: 92.3 %
Patient temperature: 37
pCO2, Ven: 49 mmHg (ref 44–60)
pH, Ven: 7.39 (ref 7.25–7.43)
pO2, Ven: 59 mmHg — ABNORMAL HIGH (ref 32–45)

## 2024-03-30 LAB — CBC
HCT: 50.8 % (ref 39.0–52.0)
Hemoglobin: 17.1 g/dL — ABNORMAL HIGH (ref 13.0–17.0)
MCH: 29.7 pg (ref 26.0–34.0)
MCHC: 33.7 g/dL (ref 30.0–36.0)
MCV: 88.2 fL (ref 80.0–100.0)
Platelets: 219 K/uL (ref 150–400)
RBC: 5.76 MIL/uL (ref 4.22–5.81)
RDW: 12.9 % (ref 11.5–15.5)
WBC: 8.4 K/uL (ref 4.0–10.5)
nRBC: 0 % (ref 0.0–0.2)

## 2024-03-30 LAB — COMPREHENSIVE METABOLIC PANEL WITH GFR
ALT: 23 U/L (ref 0–44)
AST: 19 U/L (ref 15–41)
Albumin: 3.9 g/dL (ref 3.5–5.0)
Alkaline Phosphatase: 92 U/L (ref 38–126)
Anion gap: 13 (ref 5–15)
BUN: 11 mg/dL (ref 6–20)
CO2: 26 mmol/L (ref 22–32)
Calcium: 9 mg/dL (ref 8.9–10.3)
Chloride: 101 mmol/L (ref 98–111)
Creatinine, Ser: 0.96 mg/dL (ref 0.61–1.24)
GFR, Estimated: >60 mL/min (ref >60)
Glucose, Bld: 140 mg/dL — ABNORMAL HIGH (ref 70–99)
Potassium: 3.8 mmol/L (ref 3.5–5.1)
Sodium: 140 mmol/L (ref 135–145)
Total Bilirubin: 0.7 mg/dL (ref 0.0–1.2)
Total Protein: 7.2 g/dL (ref 6.5–8.1)

## 2024-03-30 LAB — RESP PANEL BY RT-PCR (RSV, FLU A&B, COVID)  RVPGX2
Influenza A by PCR: NEGATIVE
Influenza B by PCR: NEGATIVE
Resp Syncytial Virus by PCR: NEGATIVE
SARS Coronavirus 2 by RT PCR: NEGATIVE

## 2024-03-30 LAB — TROPONIN I (HIGH SENSITIVITY)
Troponin I (High Sensitivity): 11 ng/L (ref <18)
Troponin I (High Sensitivity): 9 ng/L (ref <18)

## 2024-03-30 LAB — BRAIN NATRIURETIC PEPTIDE: B Natriuretic Peptide: 59.2 pg/mL (ref 0.0–100.0)

## 2024-03-30 MED ORDER — ONDANSETRON HCL 4 MG/2ML IJ SOLN: 4.0000 mg | Freq: Four times a day (QID) | INTRAMUSCULAR | Status: DC | PRN

## 2024-03-30 MED ORDER — IPRATROPIUM-ALBUTEROL 0.5-2.5 (3) MG/3ML IN SOLN: 3.0000 mL | Freq: Four times a day (QID) | RESPIRATORY_TRACT | Status: DC | PRN

## 2024-03-30 MED ORDER — ENOXAPARIN SODIUM 60 MG/0.6ML IJ SOSY: 0.5000 mg/kg | PREFILLED_SYRINGE | INTRAMUSCULAR | Status: DC

## 2024-03-30 MED ORDER — PREDNISONE 20 MG PO TABS
40.0000 mg | ORAL_TABLET | Freq: Every day | ORAL | Status: DC
  Administered 2024-03-31: 40 mg via ORAL
  Filled 2024-03-30: qty 2

## 2024-03-30 MED ORDER — FUROSEMIDE 40 MG PO TABS
40.0000 mg | ORAL_TABLET | Freq: Every day | ORAL | Status: DC
  Administered 2024-03-31: 40 mg via ORAL
  Filled 2024-03-30: qty 1

## 2024-03-30 MED ORDER — METHYLPREDNISOLONE SODIUM SUCC 125 MG IJ SOLR
125.0000 mg | Freq: Once | INTRAMUSCULAR | Status: AC
  Administered 2024-03-30: 125 mg via INTRAVENOUS
  Filled 2024-03-30: qty 2

## 2024-03-30 MED ORDER — AMLODIPINE BESYLATE 5 MG PO TABS
10.0000 mg | ORAL_TABLET | Freq: Every day | ORAL | Status: DC
Start: 2024-03-30 — End: 2024-03-31
  Administered 2024-03-31: 10 mg via ORAL
  Filled 2024-03-30: qty 2

## 2024-03-30 MED ORDER — ACETAMINOPHEN 325 MG PO TABS: 650.0000 mg | ORAL_TABLET | Freq: Four times a day (QID) | ORAL | Status: DC | PRN

## 2024-03-30 MED ORDER — ALBUTEROL SULFATE (2.5 MG/3ML) 0.083% IN NEBU
10.0000 mg/h | INHALATION_SOLUTION | Freq: Once | RESPIRATORY_TRACT | Status: AC
  Administered 2024-03-30: 10 mg/h via RESPIRATORY_TRACT
  Filled 2024-03-30: qty 12

## 2024-03-30 MED ORDER — DOXYCYCLINE HYCLATE 100 MG PO TABS: 100.0000 mg | ORAL_TABLET | Freq: Once | ORAL | Status: DC

## 2024-03-30 MED ORDER — IPRATROPIUM-ALBUTEROL 0.5-2.5 (3) MG/3ML IN SOLN
9.0000 mL | Freq: Once | RESPIRATORY_TRACT | Status: AC
  Administered 2024-03-30: 9 mL via RESPIRATORY_TRACT
  Filled 2024-03-30: qty 9

## 2024-03-30 MED ORDER — ACETAMINOPHEN 650 MG RE SUPP: 650.0000 mg | Freq: Four times a day (QID) | RECTAL | Status: DC | PRN

## 2024-03-30 MED ORDER — MELATONIN 5 MG PO TABS
5.0000 mg | ORAL_TABLET | Freq: Every evening | ORAL | Status: DC | PRN
  Administered 2024-03-30: 5 mg via ORAL
  Filled 2024-03-30: qty 1

## 2024-03-30 MED ORDER — SODIUM CHLORIDE 0.9 % IV SOLN
500.0000 mg | INTRAVENOUS | Status: AC
  Administered 2024-03-30: 500 mg via INTRAVENOUS
  Filled 2024-03-30: qty 5

## 2024-03-30 MED ORDER — ONDANSETRON HCL 4 MG PO TABS: 4.0000 mg | ORAL_TABLET | Freq: Four times a day (QID) | ORAL | Status: DC | PRN

## 2024-03-30 MED ORDER — IPRATROPIUM-ALBUTEROL 0.5-2.5 (3) MG/3ML IN SOLN
3.0000 mL | RESPIRATORY_TRACT | Status: DC | PRN
  Administered 2024-03-30: 3 mL via RESPIRATORY_TRACT
  Filled 2024-03-30 (×2): qty 3

## 2024-03-30 MED ORDER — ASPIRIN 81 MG PO TBEC
81.0000 mg | DELAYED_RELEASE_TABLET | Freq: Every day | ORAL | Status: DC
Start: 2024-03-30 — End: 2024-03-31
  Administered 2024-03-31: 81 mg via ORAL
  Filled 2024-03-30: qty 1

## 2024-03-30 MED ORDER — POLYETHYLENE GLYCOL 3350 17 G PO PACK: 17.0000 g | PACK | Freq: Every day | ORAL | Status: DC | PRN

## 2024-03-30 MED ORDER — FUROSEMIDE 10 MG/ML IJ SOLN
40.0000 mg | Freq: Once | INTRAMUSCULAR | Status: AC
  Administered 2024-03-30: 40 mg via INTRAVENOUS
  Filled 2024-03-30: qty 4

## 2024-03-30 MED ORDER — METHYLPREDNISOLONE SODIUM SUCC 40 MG IJ SOLR
40.0000 mg | Freq: Two times a day (BID) | INTRAMUSCULAR | Status: AC
  Administered 2024-03-30 – 2024-03-31 (×2): 40 mg via INTRAVENOUS
  Filled 2024-03-30 (×2): qty 1

## 2024-03-30 MED ORDER — FUROSEMIDE 40 MG PO TABS
40.0000 mg | ORAL_TABLET | Freq: Every day | ORAL | Status: DC
Start: 2024-03-30 — End: 2024-03-30

## 2024-03-30 MED ORDER — AZITHROMYCIN 500 MG PO TABS: 500.0000 mg | ORAL_TABLET | Freq: Every day | ORAL | Status: DC

## 2024-03-30 MED ORDER — IPRATROPIUM-ALBUTEROL 0.5-2.5 (3) MG/3ML IN SOLN
3.0000 mL | Freq: Once | RESPIRATORY_TRACT | Status: AC
  Administered 2024-03-30: 3 mL via RESPIRATORY_TRACT
  Filled 2024-03-30: qty 3

## 2024-03-30 NOTE — ED Triage Notes (Signed)
 From [facility] for SOB since 2 days. Hx COPD. Was exposed to dust. Has been using inhalers. Pt is very labored with pronounced wheezing. Also current smoker and multiple allergies.

## 2024-03-30 NOTE — ED Notes (Signed)
 Lasix  administration delayed due to the pt initially refusing the lasix  but the later advising he would take it. Urinal at the bedside.

## 2024-03-30 NOTE — ED Notes (Signed)
 Per Dr. Odessa verbal order the pt was taken off of bipap. The pt was placed on 4 liters of O2 via nasal cannula. Shortly after the pt was placed on 4 liters the pt's pulse ox was began to drop to 89 percent. Dr. Paudel was advised. The doctor ordered the pt back on bipap.

## 2024-03-30 NOTE — ED Notes (Signed)
The pt was placed back on bipap.

## 2024-03-30 NOTE — Progress Notes (Signed)
 Off bipap to eat. Placed on nasal cannula at 4 liters. NAD.
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
## 2024-03-30 NOTE — ED Notes (Signed)
 Pt reassessed by RT due to pt wanting to take mask off to eat. Pt taken off Bipap and placed on [place] at this time.

## 2024-03-30 NOTE — ED Provider Notes (Signed)
 [facility] Provider Note    Event Date/Time   First MD Initiated Contact with Patient 03/30/24 1125     (approximate)   History   Respiratory Distress   HPI  [name] is a 58 year old male with history of COPD, CAD presenting to the emergency department for evaluation of shortness of breath.  For the last 2 days, patient reports worsening shortness of breath and wheezing.  Was exposed to dust.  Has been using his home inhalers and breathing treatments with limited benefit.  No fevers.  Denies chest pain.    Physical Exam   Triage Vital Signs: ED Triage Vitals  Encounter Vitals Group     BP 03/30/24 1106 (!) 128/99     Girls Systolic BP Percentile --      Girls Diastolic BP Percentile --      Boys Systolic BP Percentile --      Boys Diastolic BP Percentile --      Pulse Rate 03/30/24 1106 (!) 102     Resp 03/30/24 1106 (!) 28     Temp 03/30/24 1106 98.3 F (36.8 C)     Temp Source 03/30/24 1106 Oral     SpO2 03/30/24 1100 92 %     Weight 03/30/24 1104 268 lb 15.4 oz (122 kg)     Height 03/30/24 1104 5' 10 (1.778 m)     Head Circumference --      Peak Flow --      Pain Score 03/30/24 1101 3     Pain Loc --      Pain Education --      Exclude from Growth Chart --     Most recent vital signs: Vitals:   03/30/24 1106 03/30/24 1300  BP: (!) 128/99 (!) 179/110  Pulse: (!) 102 86  Resp: (!) 28 (!) 22  Temp: 98.3 F (36.8 C)   SpO2: 92% 93%     General: Awake, interactive  CV:  Regular rate, good peripheral perfusion.  Resp:  Tachypneic with increased work of breathing, poor air movement with diffuse wheezing Abd:  Nondistended.  Neuro:  Symmetric facial movement, fluid speech   ED Results / Procedures / Treatments   Labs (all labs ordered are listed, but only abnormal results are displayed) Labs Reviewed  CBC - Abnormal; Notable for the following components:      Result Value   Hemoglobin 17.1 (*)    All other components  within normal limits  COMPREHENSIVE METABOLIC PANEL WITH GFR - Abnormal; Notable for the following components:   Glucose, Bld 140 (*)    All other components within normal limits  BLOOD GAS, VENOUS - Abnormal; Notable for the following components:   pO2, Ven 59 (*)    Bicarbonate 29.7 (*)    Acid-Base Excess 3.7 (*)    All other components within normal limits  BRAIN NATRIURETIC PEPTIDE  TROPONIN I (HIGH SENSITIVITY)  TROPONIN I (HIGH SENSITIVITY)     EKG EKG independently reviewed and interpreted by myself demonstrates:  EKG demonstrates normal sinus rhythm at a rate of 91, PR 134, cures 78, QTc 457, nonspecific ST changes, no STEMI  RADIOLOGY Imaging independently reviewed and interpreted by myself demonstrates:  CXR without focal consolidation  Formal Radiology Read:  DG Chest 2 View Result Date: 03/30/2024 CLINICAL DATA:  Shortness of breath EXAM: CHEST - 2 VIEW COMPARISON:  01/18/2024 FINDINGS: Emphysema. Previous airway thickening has resolved. No current airspace opacity identified. No blunting of the  costophrenic angles. No significant bony abnormality identified. IMPRESSION: 1. Emphysema (ICD10-J43.9). 2. Previous airway thickening has resolved. Electronically Signed   By: Ryan Salvage M.D.   On: 03/30/2024 12:11    PROCEDURES:  Critical Care performed: Yes, see critical care procedure note(s)  CRITICAL CARE Performed by: Nilsa Dade   Total critical care time: 32 minutes  Critical care time was exclusive of separately billable procedures and treating other patients.  Critical care was necessary to treat or prevent imminent or life-threatening deterioration.  Critical care was time spent personally by me on the following activities: development of treatment plan with patient and/or surrogate as well as nursing, discussions with consultants, evaluation of patient's response to treatment, examination of patient, obtaining history from patient or surrogate, ordering  and performing treatments and interventions, ordering and review of laboratory studies, ordering and review of radiographic studies, pulse oximetry and re-evaluation of patient's condition.   Procedures   MEDICATIONS ORDERED IN ED: Medications  doxycycline  (VIBRA -TABS) tablet 100 mg (has no administration in time range)  ipratropium-albuterol  (DUONEB) 0.5-2.5 (3) MG/3ML nebulizer solution 3 mL (3 mLs Nebulization Given 03/30/24 1114)  ipratropium-albuterol  (DUONEB) 0.5-2.5 (3) MG/3ML nebulizer solution 9 mL (9 mLs Nebulization Given 03/30/24 1157)  methylPREDNISolone  sodium succinate (SOLU-MEDROL ) 125 mg/2 mL injection 125 mg (125 mg Intravenous Given 03/30/24 1153)  albuterol  (PROVENTIL ) (2.5 MG/3ML) 0.083% nebulizer solution (10 mg/hr Nebulization Given 03/30/24 1243)     IMPRESSION / MDM / ASSESSMENT AND PLAN / ED COURSE  I reviewed the triage vital signs and the nursing notes.  Differential diagnosis includes, but is not limited to, COPD exacerbation, pneumonia, viral illness, environmental trigger, much lower suspicion ACS, PE, pneumothorax given abnormal lung exam  Patient's presentation is most consistent with acute presentation with potential threat to life or bodily function.  58 year old male presenting to the emergency department for evaluation of shortness of breath.  Presents with diffuse wheezing and increased work of breathing.  Borderline hypoxia on room air.  Was given several rounds of nebulizers with minimal improvement in work of breathing.  In the setting of this, respiratory was contacted and patient was placed on BiPAP with significant improvement in his work of breathing.  VBG is overall reassuring, but will maintain on BiPAP with continuous albuterol  given initial labored respirations.  CBC and CMP without significant derangement.  Negative BNP and troponin.  X-Canaan Holzer without focal consolidation.  No pneumonia, but in the setting of COPD exacerbation with penicillin allergy,  ordered for oral doxycycline .  Do think patient is appropriate for admission.  Will reach out to hospitalist team.  Clinical Course as of 03/30/24 1446  Sat Mar 30, 2024  1445 Case discussed with hospitalist team.  They will evaluate for anticipated admission. [NR]    Clinical Course User Index [NR] Dade Nilsa, MD     FINAL CLINICAL IMPRESSION(S) / ED DIAGNOSES   Final diagnoses:  COPD exacerbation (HCC)     Rx / DC Orders   ED Discharge Orders     None        Note:  This document was prepared using Dragon voice recognition software and may include unintentional dictation errors.   Dade Nilsa, MD 03/30/24 [id number]

## 2024-03-30 NOTE — ED Notes (Signed)
 Dr. Levander advised the pt needs to be placed on bipap.

## 2024-03-30 NOTE — ED Notes (Signed)
 Pt comfortable, warm blanket given , no other needs at this time

## 2024-03-31 DIAGNOSIS — J441 Chronic obstructive pulmonary disease with (acute) exacerbation: Secondary | ICD-10-CM | POA: Diagnosis not present

## 2024-03-31 LAB — PROTIME-INR
INR: 1 (ref 0.8–1.2)
Prothrombin Time: 13.2 s (ref 11.4–15.2)

## 2024-03-31 LAB — CBC
HCT: 50 % (ref 39.0–52.0)
Hemoglobin: 16.5 g/dL (ref 13.0–17.0)
MCH: 29.5 pg (ref 26.0–34.0)
MCHC: 33 g/dL (ref 30.0–36.0)
MCV: 89.3 fL (ref 80.0–100.0)
Platelets: 224 K/uL (ref 150–400)
RBC: 5.6 MIL/uL (ref 4.22–5.81)
RDW: 13.2 % (ref 11.5–15.5)
WBC: 14.3 K/uL — ABNORMAL HIGH (ref 4.0–10.5)
nRBC: 0 % (ref 0.0–0.2)

## 2024-03-31 LAB — COMPREHENSIVE METABOLIC PANEL WITH GFR
ALT: 22 U/L (ref 0–44)
AST: 17 U/L (ref 15–41)
Albumin: 3.7 g/dL (ref 3.5–5.0)
Alkaline Phosphatase: 95 U/L (ref 38–126)
Anion gap: 13 (ref 5–15)
BUN: 24 mg/dL — ABNORMAL HIGH (ref 6–20)
CO2: 24 mmol/L (ref 22–32)
Calcium: 9.2 mg/dL (ref 8.9–10.3)
Chloride: 102 mmol/L (ref 98–111)
Creatinine, Ser: 1.08 mg/dL (ref 0.61–1.24)
GFR, Estimated: >60 mL/min (ref >60)
Glucose, Bld: 168 mg/dL — ABNORMAL HIGH (ref 70–99)
Potassium: 4.2 mmol/L (ref 3.5–5.1)
Sodium: 139 mmol/L (ref 135–145)
Total Bilirubin: 0.4 mg/dL (ref 0.0–1.2)
Total Protein: 7.2 g/dL (ref 6.5–8.1)

## 2024-03-31 LAB — HIV ANTIBODY (ROUTINE TESTING W REFLEX): HIV Screen 4th Generation wRfx: NONREACTIVE

## 2024-03-31 MED ORDER — PREDNISONE 20 MG PO TABS: 40.0000 mg | ORAL_TABLET | Freq: Every day | ORAL | 0 refills | Status: AC

## 2024-03-31 MED ORDER — AZITHROMYCIN 500 MG PO TABS: 500.0000 mg | ORAL_TABLET | Freq: Every day | ORAL | 0 refills | Status: AC

## 2024-03-31 NOTE — ED Notes (Signed)
 Lab at bedside

## 2024-03-31 NOTE — Progress Notes (Incomplete)
 PROGRESS NOTE  [name]    DOB: March 05, 1966, 58 y.o.  FMW:[id number]    Code Status: Full Code   DOA: 03/30/2024   LOS: 1   Brief hospital course  [name] is a 58 y.o. male with a PMH significant for COPD not on home oxygen, CAD, chronic smoking, history of substance abuse who came into ED complaining of shortness of breath and cough for the last 2 days.  He stated that his shortness of breath has been worsening associated with cough without sputum production.  He also feels that he has extensive wheezes.  He stated that he was exposed to dust and he believes that might have caused the problem.  He has been using his home inhalers and breathing treatments without any relief.  He denies any fever, chills, nausea, vomiting, chest pain, palpitations.   ED Course: Upon arrival to the ED, patient is found to be severely wheezing, tachycardic at 102, tachypneic around 28.  Chest x-ray was negative for acute pneumonia.  He was placed on BiPAP for work of breathing, started on nebulizer, received doxycycline , methylprednisolone  and is still found to be short of breath requiring BiPAP.  Hospitalist service was consulted for evaluation for admission.  03/31/24 -***  Assessment & Plan  Principal Problem:   COPD exacerbation (HCC) Active Problems:   Obstructive sleep apnea   Hypertension   Tobacco abuse   Coronary artery disease involving native coronary artery of native heart with angina pectoris (HCC)  Acute hypoxemic respiratory failure - Due to acute exacerbation of chronic obstructive pulmonary disease - He has a coronary artery disease and takes Lasix , there could be a combination of congestive heart failure and COPD. - However no leg swelling or rales on exam. - He will be admitted to the hospital as inpatient - He was given nebulization, IV steroid, oxygen and BiPAP - Will continue BiPAP to maintain saturation more than 90% and decrease work of breathing - Continue oxygen to maintain  saturation more than 90% - Will continue steroid IV and oral, nebulization, antibiotic azithromycin .  Patient received doxycycline  in the emergency room   2.  Obstructive sleep apnea - Continue BiPAP during the daytime and CPAP during the night   3.  CAD/?  CHF - Continue aspirin , statin and Lasix  at home dose. - Does not complain chest pain   4.  HTN/HLD - Resume home medications - Monitor blood pressure   5.  Active chronic smoking - Extensive counseling was done for smoking cessation.  Body mass index is 38.59 kg/m.  VTE ppx: SCDs Start: 03/30/24 1510   Diet:     Diet   Diet regular Room service appropriate? Yes; Fluid consistency: Thin   Consultants: ***  Subjective 03/31/24    Pt reports ***   Objective  Blood pressure (!) 157/96, pulse 77, temperature 97.8 F (36.6 C), temperature source Axillary, resp. rate (!) 21, height 5' 10 (1.778 m), weight 122 kg, SpO2 92%.  Intake/Output Summary (Last 24 hours) at 03/31/2024 0751 Last data filed at 03/30/2024 1828 Gross per 24 hour  Intake --  Output 675 ml  Net -675 ml   Filed Weights   03/30/24 1104  Weight: 122 kg     Physical Exam: *** General: awake, alert, NAD HEENT: atraumatic, clear conjunctiva, anicteric sclera, MMM, hearing grossly normal Respiratory: normal respiratory effort. Cardiovascular: quick capillary refill, normal S1/S2, RRR, no JVD, murmurs Gastrointestinal: soft, NT, ND Nervous: A&O x3. no gross focal neurologic deficits,  normal speech Extremities: moves all equally, no edema, normal tone Skin: dry, intact, normal temperature, normal color. No rashes, lesions or ulcers on exposed skin Psychiatry: normal mood, congruent affect  Labs   I have personally reviewed the following labs and imaging studies CBC    Component Value Date/Time   WBC 8.4 03/30/2024 1113   RBC 5.76 03/30/2024 1113   HGB 17.1 (H) 03/30/2024 1113   HGB 16.6 02/26/2024 1528   HCT 50.8 03/30/2024 1113   HCT 49.9  02/26/2024 1528   PLT 219 03/30/2024 1113   PLT 208 02/26/2024 1528   MCV 88.2 03/30/2024 1113   MCV 91 02/26/2024 1528   MCH 29.7 03/30/2024 1113   MCHC 33.7 03/30/2024 1113   RDW 12.9 03/30/2024 1113   RDW 13.0 02/26/2024 1528   LYMPHSABS 1.6 03/15/2024 1148   MONOABS 0.6 03/15/2024 1148   EOSABS 0.3 03/15/2024 1148   BASOSABS 0.0 03/15/2024 1148      Latest Ref Rng & Units 03/30/2024   11:13 AM 03/15/2024   11:48 AM 02/26/2024    3:28 PM  BMP  Glucose 70 - 99 mg/dL 859  887  89   BUN 6 - 20 mg/dL 11  22  24    Creatinine 0.61 - 1.24 mg/dL 9.03  8.77  8.81   BUN/Creat Ratio 9 - 20   20   Sodium 135 - 145 mmol/L 140  139  140   Potassium 3.5 - 5.1 mmol/L 3.8  3.8  4.4   Chloride 98 - 111 mmol/L 101  96  99   CO2 22 - 32 mmol/L 26  34  25   Calcium  8.9 - 10.3 mg/dL 9.0  9.2  9.3     DG Chest 2 View Result Date: 03/30/2024 CLINICAL DATA:  Shortness of breath EXAM: CHEST - 2 VIEW COMPARISON:  01/18/2024 FINDINGS: Emphysema. Previous airway thickening has resolved. No current airspace opacity identified. No blunting of the costophrenic angles. No significant bony abnormality identified. IMPRESSION: 1. Emphysema (ICD10-J43.9). 2. Previous airway thickening has resolved. Electronically Signed   By: Ryan Salvage M.D.   On: 03/30/2024 12:11    Disposition Plan & Communication  Patient status: Inpatient  Admitted From: {From:[id number]} Planned disposition location: {PLAN; DISPOSITION:26386} Anticipated discharge date: *** pending ***  Family Communication: ***    Author: Marien LITTIE Piety, DO [facility] 03/31/2024, 7:51 AM   Available by Epic secure chat 7AM-7PM. If 7PM-7AM, please contact night-coverage.  [facility] contact information found on [email or web address].

## 2024-03-31 NOTE — Discharge Instructions (Signed)
 Continue your breathing treatments as well as your rescue inhaler as needed.  Keep your appointment with your pulmonologist next week to discuss improving your respiratory status.  Please complete your antibiotic and steroid course which were sent to your pharmacy

## 2024-03-31 NOTE — ED Notes (Addendum)
 Patient ambulatory on room air approximately 150 feet; patient SpO2 maintained at 93% with ambulation.  Patient continues w/ audible wheezing, but denies any complaints at this time.  Patient states, That's normal for me.

## 2024-03-31 NOTE — ED Notes (Signed)
 Patient pacing in room, has removed O2 and all leads/sensors.  Patient again expresses that he feels good and wishes to go home.  Awaiting final ok to discharge from MD at this time.

## 2024-03-31 NOTE — ED Notes (Signed)
 Patient placed back on 3L [place] due to room air saturation at rest 88-92%.

## 2024-03-31 NOTE — ED Notes (Signed)
 Hospitalist at bedside

## 2024-03-31 NOTE — Discharge Summary (Signed)
 Physician Discharge Summary  Patient: [name] FMW:[id number] DOB: 26-Jul-1965   Code Status: Full Code Admit date: 03/30/2024 Discharge date: 03/31/2024 Disposition: Home, No home health services recommended PCP: Wendee Lynwood HERO, NP  Recommendations for Outpatient Follow-up:  Follow up with PCP within 1-2 weeks Regarding general hospital follow up and preventative care Follow up with pulmonology   Discharge Diagnoses:  Principal Problem:   COPD exacerbation (HCC) Active Problems:   Obstructive sleep apnea   Hypertension   Tobacco abuse   Coronary artery disease involving native coronary artery of native heart with angina pectoris [facility])  Brief Hospital Course Summary: [name] is a 58 y.o. male with a PMH significant for COPD not on home oxygen, CAD, chronic smoking, history of substance abuse who came into ED complaining of shortness of breath and cough for the last 2 days.     ED Course: severely wheezing, tachycardic at 102, tachypneic around 28.  Chest x-ray was negative for acute pneumonia.  He was placed on BiPAP for work of breathing, started on nebulizer, received doxycycline , methylprednisolone .   With steroids and breathing treatments, he recuperated very rapidly.  He self-weaned himself from oxygen the following day.  With ambulation, his oxygen saturation remained above 93%.  He denies SOB. Although he did still have wheezing, he and wife said this is normal for him. He has all his home nebulizers and inhalers and does not need refills. States that he has a pulmonology appointment scheduled next week already.  Continued steroid and antibiotic course PO at dc as listed below.   All other chronic conditions were treated with home medications.    Discharge Condition: Good, improved Recommended discharge diet: Regular healthy diet  Consultations: None   Procedures/Studies: None  Allergies as of 03/31/2024       Reactions   Bee Venom Swelling   Severe  swelling   Fish Allergy Anaphylaxis   Throat swelling with seafood   Iodinated Contrast Media Anaphylaxis   Latex Anaphylaxis   Penicillins Anaphylaxis   Has patient had a PCN reaction causing immediate rash, facial/tongue/throat swelling, SOB or lightheadedness with hypotension: Yes Has patient had a PCN reaction causing severe rash involving mucus membranes or skin necrosis: No Has patient had a PCN reaction that required hospitalization No Has patient had a PCN reaction occurring within the last 10 years: No If all of the above answers are NO, then may proceed with Cephalosporin use.   Bee Venom    Iodinated Contrast Media    CARDIAC ARREST   Latex    Penicillins    Shellfish Allergy         Medication List     TAKE these medications    albuterol  108 (90 Base) MCG/ACT inhaler Commonly known as: VENTOLIN  HFA Inhale 2 puffs into the lungs every 6 (six) hours as needed for wheezing or shortness of breath.   amLODipine  10 MG tablet Commonly known as: NORVASC  Take 1 tablet (10 mg total) by mouth daily.   Aspirin  Low Dose 81 MG tablet Generic drug: aspirin  EC Take 1 tablet (81 mg total) by mouth daily. Swallow whole.   azithromycin  500 MG tablet Commonly known as: ZITHROMAX  Take 1 tablet (500 mg total) by mouth daily for 3 days.   Breyna  160-4.5 MCG/ACT inhaler Generic drug: budesonide -formoterol  Inhale 2 puffs into the lungs 2 (two) times daily.   EPINEPHrine  0.3 mg/0.3 mL Soaj injection Commonly known as: EPI-PEN INJECT 0.3 MLS (0.3 MG TOTAL) INTO THE MUSCLE  AS NEEDED FOR ANAPHYLAXIS.   furosemide  40 MG tablet Commonly known as: LASIX  Take 1 tablet (40 mg total) by mouth daily for 3 days, then decrease to taking 1 tablet (40 mg total) by mouth daily as needed for lower extremity swelling.   losartan  50 MG tablet Commonly known as: COZAAR  Take 1 tablet (50 mg total) by mouth daily.   nitroGLYCERIN  0.4 MG SL tablet Commonly known as: NITROSTAT  DISSOLVE ONE  TABLET UNDER THE TONGUE EVERY 5 MINUTES AS NEEDED FOR CHEST PAIN.  DO NOT EXCEED A TOTAL OF 3 DOSES IN 15 MINUTES   potassium chloride  SA 20 MEQ tablet Commonly known as: KLOR-CON  M Take 1 tablet (20 mEq total) by mouth daily for 3 days. Then decrease to taking 1 tablet (20 mEq total) daily as needed with Lasix  administration.   predniSONE  20 MG tablet Commonly known as: DELTASONE  Take 2 tablets (40 mg total) by mouth daily with breakfast for 5 days.   Spiriva  Respimat 2.5 MCG/ACT Aers Generic drug: Tiotropium Bromide Monohydrate  Inhale 2 puffs into the lungs daily.        Follow-up Information     Wendee Lynwood HERO, NP. Schedule an appointment as soon as possible for a visit in 1 week(s).   Specialties: Nurse Practitioner, Family Medicine Contact information: [address] [place] [address] [phone]                 Subjective   Pt reports feeling well. Denies SOB at rest. He really wants to go home.   All questions and concerns were addressed at time of discharge.  Objective  Blood pressure (!) 157/96, pulse 77, temperature 97.8 F (36.6 C), temperature source Axillary, resp. rate (!) 21, height 5' 10 (1.778 m), weight 122 kg, SpO2 92%.   General: Pt is alert, awake, not in acute distress. obese Cardiovascular: RRR, S1/S2 +, no rubs, no gallops Respiratory: CTA bilaterally, expiratory wheezing from upper airways, no rhonchi Abdominal: Soft, NT, ND, bowel sounds + Extremities: no edema, no cyanosis  The results of significant diagnostics from this hospitalization (including imaging, microbiology, ancillary and laboratory) are listed below for reference.   Imaging studies: DG Chest 2 View Result Date: 03/30/2024 CLINICAL DATA:  Shortness of breath EXAM: CHEST - 2 VIEW COMPARISON:  01/18/2024 FINDINGS: Emphysema. Previous airway thickening has resolved. No current airspace opacity identified. No blunting of the costophrenic angles. No significant bony  abnormality identified. IMPRESSION: 1. Emphysema (ICD10-J43.9). 2. Previous airway thickening has resolved. Electronically Signed   By: Ryan Salvage M.D.   On: 03/30/2024 12:11    Labs: Basic Metabolic Panel: Recent Labs  Lab 03/30/24 1113 03/31/24 0735  NA 140 139  K 3.8 4.2  CL 101 102  CO2 26 24  GLUCOSE 140* 168*  BUN 11 24*  CREATININE 0.96 1.08  CALCIUM  9.0 9.2   CBC: Recent Labs  Lab 03/30/24 1113 03/31/24 0735  WBC 8.4 14.3*  HGB 17.1* 16.5  HCT 50.8 50.0  MCV 88.2 89.3  PLT 219 224   Microbiology: Results for orders placed or performed during the hospital encounter of 03/30/24  Resp panel by RT-PCR (RSV, Flu A&B, Covid) Anterior Nasal Swab     Status: None   Collection Time: 03/30/24  3:06 PM   Specimen: Anterior Nasal Swab  Result Value Ref Range Status   SARS Coronavirus 2 by RT PCR NEGATIVE NEGATIVE Final    Comment: (NOTE) SARS-CoV-2 target nucleic acids are NOT DETECTED.  The SARS-CoV-2  RNA is generally detectable in upper respiratory specimens during the acute phase of infection. The lowest concentration of SARS-CoV-2 viral copies this assay can detect is 138 copies/mL. A negative result does not preclude SARS-Cov-2 infection and should not be used as the sole basis for treatment or other patient management decisions. A negative result may occur with  improper specimen collection/handling, submission of specimen other than nasopharyngeal swab, presence of viral mutation(s) within the areas targeted by this assay, and inadequate number of viral copies(<138 copies/mL). A negative result must be combined with clinical observations, patient history, and epidemiological information. The expected result is Negative.  Fact Sheet for Patients:  BloggerCourse.com  Fact Sheet for Healthcare Providers:  SeriousBroker.it  This test is no t yet approved or cleared by the [place]  FDA and  has been  authorized for detection and/or diagnosis of SARS-CoV-2 by FDA under an Emergency Use Authorization (EUA). This EUA will remain  in effect (meaning this test can be used) for the duration of the COVID-19 declaration under Section 564(b)(1) of the Act, 21 U.S.C.section 360bbb-3(b)(1), unless the authorization is terminated  or revoked sooner.       Influenza A by PCR NEGATIVE NEGATIVE Final   Influenza B by PCR NEGATIVE NEGATIVE Final    Comment: (NOTE) The Xpert Xpress SARS-CoV-2/FLU/RSV plus assay is intended as an aid in the diagnosis of influenza from Nasopharyngeal swab specimens and should not be used as a sole basis for treatment. Nasal washings and aspirates are unacceptable for Xpert Xpress SARS-CoV-2/FLU/RSV testing.  Fact Sheet for Patients: BloggerCourse.com  Fact Sheet for Healthcare Providers: SeriousBroker.it  This test is not yet approved or cleared by the [place]  FDA and has been authorized for detection and/or diagnosis of SARS-CoV-2 by FDA under an Emergency Use Authorization (EUA). This EUA will remain in effect (meaning this test can be used) for the duration of the COVID-19 declaration under Section 564(b)(1) of the Act, 21 U.S.C. section 360bbb-3(b)(1), unless the authorization is terminated or revoked.     Resp Syncytial Virus by PCR NEGATIVE NEGATIVE Final    Comment: (NOTE) Fact Sheet for Patients: BloggerCourse.com  Fact Sheet for Healthcare Providers: SeriousBroker.it  This test is not yet approved or cleared by the [place]  FDA and has been authorized for detection and/or diagnosis of SARS-CoV-2 by FDA under an Emergency Use Authorization (EUA). This EUA will remain in effect (meaning this test can be used) for the duration of the COVID-19 declaration under Section 564(b)(1) of the Act, 21 U.S.C. section 360bbb-3(b)(1), unless the  authorization is terminated or revoked.  Performed at [facility], [address] [place], [place] [address]     Time coordinating discharge: Over 30 minutes  Marien LITTIE Piety, MD  [facility] 03/31/2024, 8:41 AM

## 2024-04-01 ENCOUNTER — Ambulatory Visit: Attending: Physician Assistant | Admitting: Physician Assistant

## 2024-04-01 ENCOUNTER — Encounter: Payer: Self-pay | Admitting: Physician Assistant

## 2024-04-01 ENCOUNTER — Telehealth: Payer: Self-pay | Admitting: Gastroenterology

## 2024-04-01 ENCOUNTER — Telehealth: Payer: Self-pay

## 2024-04-01 VITALS — BP 158/84 | HR 80 | Ht 70.0 in | Wt 263.0 lb

## 2024-04-01 DIAGNOSIS — E785 Hyperlipidemia, unspecified: Secondary | ICD-10-CM

## 2024-04-01 DIAGNOSIS — R600 Localized edema: Secondary | ICD-10-CM | POA: Diagnosis not present

## 2024-04-01 DIAGNOSIS — R0609 Other forms of dyspnea: Secondary | ICD-10-CM | POA: Diagnosis not present

## 2024-04-01 DIAGNOSIS — R079 Chest pain, unspecified: Secondary | ICD-10-CM | POA: Diagnosis not present

## 2024-04-01 DIAGNOSIS — Z720 Tobacco use: Secondary | ICD-10-CM | POA: Diagnosis not present

## 2024-04-01 DIAGNOSIS — I25118 Atherosclerotic heart disease of native coronary artery with other forms of angina pectoris: Secondary | ICD-10-CM

## 2024-04-01 DIAGNOSIS — I10 Essential (primary) hypertension: Secondary | ICD-10-CM | POA: Diagnosis not present

## 2024-04-01 NOTE — Progress Notes (Signed)
 Cardiology Office Note   Date:  04/01/2024  ID:  [name], DOB 28-Nov-1965, MRN [id number] PCP: Wendee Lynwood HERO, NP   HeartCare Providers Cardiologist:  Oneil Parchment, MD    History of Present Illness [name] is a 59 y.o. male with a past medical history of coronary artery disease,  bronchitis, chronic foot injury who presents for follow-up appointment.  Was last seen in August 2024 by Dr. Parchment.  He reported at that time that he had stopped taking most of his medications including Crestor  due to PND, making energy.  At that time he was feeling terrible anxious when on previous medication regimen.  He since stopped these medications he reported feeling like a new person has regained his energy motivation.  He continues to take aspirin , amlodipine , and his inhaler.  Also discussed chronic foot pain which is a result of a fall from a tree in 2013 that crushed his heel.  Despite phonophoresis the plantar persists and the only remaining treatment function was amputation which the patient is reluctant to pursue.  Reports managing his pain distances he can and is hopeful for his condition to improve over time.  In addition to his physical health concerns he also discussed in effort to quit smoking.  He was using a patch and reportedly was doing better.  He was seen by me about 2 months ago. He presented with coronary artery disease and COPD with chest pain.  He experienced severe chest pain last night during light activity, requiring nitroglycerin  for relief. Similar episodes occur two to three times a week over the past few months. He previously had a myocardial infarction confirmed by blood work, leading to the placement of two stents.  He reports persistent fatigue and significant leg swelling for two to three months, with improvement upon leg elevation.  He manages COPD with several inhalers, and a nebulizer. He denies using Symbicort . He takes amlodipine  for blood pressure  management and has not reported issues with his current medications. Physical activity has decreased due to not feeling well.  No orthopnea, PND. Reports no palpitations.   Patient was recently in the ED for COPD exacerbation.  Worsening shortness of breath and wheezing and was exposed to dust at the time.  Had been using home inhalers breathing treatments with limited benefit.  No fevers or chest pain.  He had borderline hypoxia on room air.  Was given several rounds of nebulizers with minimal improvement.  Placed on BiPAP with significant improvement.  No pneumonia but in setting of COPD exacerbation with penicillin allergy oral doxycycline  was ordered.  Today, he presents with a hx of severe COPD with recent hospitalization due to respiratory distress.  He monitors his blood pressure at home and takes amlodipine  as needed. Following a recent procedure, his blood pressure readings were significantly elevated, with systolic readings reaching 204 and diastolic readings between 110 and 125.  He experiences swelling in his feet and takes 40 mg of Lasix  daily along with potassium supplements. Despite this, he continues to have swelling. He is 'a little bit winded' after walking half a mile. No chest pain. No recent fever or other acute symptoms.  He has a long history of smoking since age 56 and has reduced his smoking from two and a half to three packs a day to half a pack a day. He wants to quit smoking but has found patches, pills, and gum ineffective  Reports no chest pain, pressure, or tightness. No edema,  orthopnea, PND. Reports no palpitations.   Discussed the use of AI scribe software for clinical note transcription with the patient, who gave verbal consent to proceed.   ROS: pertinent ROS in HPI  Studies Reviewed     Cardiac cath 02/28/24  Left Anterior Descending  Prox LAD lesion is 45% stenosed.    Ramus Intermedius  Non-stenotic Ramus lesion was previously treated.    Left  Circumflex  Non-stenotic Mid Cx lesion was previously treated.    Right Coronary Artery  Vessel is moderate in size.    Intervention   No interventions have been documented.   Left Heart  Left Ventricle LV end diastolic pressure is moderately elevated.   Coronary Diagrams  Diagnostic Dominance: Left   IMPRESSIONS     1. Left ventricular ejection fraction, by estimation, is 60 to 65%. The  left ventricle has normal function. The left ventricle has no regional  wall motion abnormalities. Left ventricular diastolic parameters were  normal. The average left ventricular  global longitudinal strain is -22.5 %. The global longitudinal strain is  normal.   2. Right ventricular systolic function is normal. The right ventricular  size is normal. Tricuspid regurgitation signal is inadequate for assessing  PA pressure.   3. The mitral valve is normal in structure. No evidence of mitral valve  regurgitation. No evidence of mitral stenosis.   4. The aortic valve is tricuspid. There is mild calcification of the  aortic valve. Aortic valve regurgitation is trivial. No aortic stenosis is  present.   5. Aortic dilatation noted. There is mild dilatation of the ascending  aorta, measuring 39 mm.   6. The inferior vena cava is normal in size with <50% respiratory  variability, suggesting right atrial pressure of 8 mmHg.   FINDINGS   Left Ventricle: Left ventricular ejection fraction, by estimation, is 60  to 65%. The left ventricle has normal function. The left ventricle has no  regional wall motion abnormalities. The average left ventricular global  longitudinal strain is -22.5 %.  The global longitudinal strain is normal. The left ventricular internal  cavity size was normal in size. There is no left ventricular hypertrophy.  Left ventricular diastolic parameters were normal.   Right Ventricle: The right ventricular size is normal. No increase in  right ventricular wall thickness.  Right ventricular systolic function is  normal. Tricuspid regurgitation signal is inadequate for assessing PA  pressure.   Left Atrium: Left atrial size was normal in size.   Right Atrium: Right atrial size was normal in size.   Pericardium: There is no evidence of pericardial effusion.   Mitral Valve: The mitral valve is normal in structure. No evidence of  mitral valve regurgitation. No evidence of mitral valve stenosis.   Tricuspid Valve: The tricuspid valve is normal in structure. Tricuspid  valve regurgitation is not demonstrated.   Aortic Valve: The aortic valve is tricuspid. There is mild calcification  of the aortic valve. Aortic valve regurgitation is trivial. No aortic  stenosis is present.   Pulmonic Valve: The pulmonic valve was normal in structure. Pulmonic valve  regurgitation is not visualized.   Aorta: Aortic dilatation noted. There is mild dilatation of the ascending  aorta, measuring 39 mm.   Venous: The inferior vena cava is normal in size with less than 50%  respiratory variability, suggesting right atrial pressure of 8 mmHg.   IAS/Shunts: No atrial level shunt detected by color flow Doppler.     Physical Exam VS:  BP ROLLEN)  158/84   Pulse 80   Ht 5' 10 (1.778 m)   Wt 263 lb (119.3 kg)   SpO2 95%   BMI 37.74 kg/m        Wt Readings from Last 3 Encounters:  04/01/24 263 lb (119.3 kg)  03/30/24 268 lb 15.4 oz (122 kg)  03/15/24 268 lb 6 oz (121.7 kg)    GEN: Well nourished, well developed in no acute distress NECK: No JVD; No carotid bruits CARDIAC: RRR, no murmurs, rubs, gallops RESPIRATORY:  Clear to auscultation without rales, wheezing or rhonchi  ABDOMEN: Soft, non-tender, non-distended EXTREMITIES:  2+ pitting bilateral edema; No deformity   ASSESSMENT AND PLAN   Chronic obstructive pulmonary disease (COPD) with recent exacerbation Recent exacerbation likely due to dust exposure, improved with prednisone , antibiotics, and nebulizer. -  Continue prednisone  for 4 more days. - Continue antibiotics for 2 more days. - Use nebulizer treatments as needed. - Follow up with pulmonary doctor next week.  Tobacco use disorder Long-standing tobacco use, desires to quit, previous cessation attempts unsuccessful. - Refer to Child psychotherapist for smoking cessation resources.  Atherosclerotic heart disease of native coronary artery with minimal LAD stenosis Coronary angiography shows 45% LAD stenosis, no intervention needed, focus on LDL control. - Obtain lipid panel to assess current cholesterol levels. - Maintain LDL levels within target range.  Hyperlipidemia Last LDL at 91 mg/dL, lipid panel update needed. - Order lipid panel at next primary care appointment. - Send results to cardiologist for review.  Essential hypertension Managed with amlodipine , recent BP elevation possibly due to prednisone . - Take amlodipine  daily unless systolic BP is below 110 mmHg. - Monitor blood pressure at home and report consistently high readings.  Lower extremity edema Persistent edema, not due to heart failure, possibly from prednisone . - Wear compression socks to manage edema. - Re-evaluate edema after completing prednisone  course.  Dispo: Follow-up in three months with me or MD  Signed, Orren LOISE Fabry, PA-C

## 2024-04-01 NOTE — Telephone Encounter (Signed)
 PT is cancelling appointment after being hospitalized for lung problems. He found out he has COPD and his doctor suggested he cancel because his lungs are not in a good condition for this procedure.

## 2024-04-01 NOTE — Transitions of Care (Post Inpatient/ED Visit) (Signed)
   04/01/2024  Name: [name] MRN: [id number] DOB: Apr 08, 1966  Today's TOC FU Call Status: Today's TOC FU Call Status:: Unsuccessful Call (1st Attempt) Unsuccessful Call (1st Attempt) Date: 04/01/24  Attempted to reach the patient regarding the most recent Inpatient/ED visit.  Follow Up Plan: Additional outreach attempts will be made to reach the patient to complete the Transitions of Care (Post Inpatient/ED visit) call.   Arvin Seip RN, BSN, CCM [employer], Population Health Case Manager Phone: [phone]

## 2024-04-01 NOTE — Patient Instructions (Signed)
 Thank you for choosing [facility]!     Medication Instructions:  TAKE THE AMLODIPINE  DAILY UNLESS YOUR BLOOD PRESSURE (TOP NUMBER) IS 110 OR LESS.  Continue to take you Lasix  as prescribed.  Wear your compression stockings for edema *If you need a refill on your cardiac medications before your next appointment, please call your pharmacy*   Lab Work: No labs were ordered during today's visit.   WHEN YOU VISIT YOUR PRIMARY CARE NEXT WEEK, ASK FOR LIPID PANEL TO BE DRAWN. THIS IS A FASTING LAB DRAW.   If you have labs (blood work) drawn today and your tests are completely normal, you will receive your results only by: MyChart Message (if you have MyChart) OR A paper copy in the mail If you have any lab test that is abnormal or we need to change your treatment, we will call you to review the results.   Testing/Procedures: No procedures were ordered during today's visit.   Your next appointment:   3 month(s)   Provider:   Oneil Parchment, MD or Orren Fabry    Follow-Up: At [facility], you and your health needs are our priority.  As part of our continuing mission to provide you with exceptional heart care, we have created designated Provider Care Teams.  These Care Teams include your primary Cardiologist (physician) and Advanced Practice Providers (APPs -  Physician Assistants and Nurse Practitioners) who all work together to provide you with the care you need, when you need it. We recommend signing up for the patient portal called MyChart.  Sign up information is provided on this After Visit Summary.  MyChart is used to connect with patients for Virtual Visits (Telemedicine).  Patients are able to view lab/test results, encounter notes, upcoming appointments, etc.  Non-urgent messages can be sent to your provider as well.   To learn more about what you can do with MyChart, go to [email or web address].

## 2024-04-02 ENCOUNTER — Telehealth: Payer: Self-pay

## 2024-04-02 NOTE — Transitions of Care (Post Inpatient/ED Visit) (Signed)
   04/02/2024  Name: [name] MRN: [id number] DOB: Sep 10, 1965  Today's TOC FU Call Status: Today's TOC FU Call Status:: Unsuccessful Call (2nd Attempt) Unsuccessful Call (2nd Attempt) Date: 04/02/24  Attempted to reach the patient regarding the most recent Inpatient/ED visit.  Follow Up Plan: Additional outreach attempts will be made to reach the patient to complete the Transitions of Care (Post Inpatient/ED visit) call.   Arvin Seip RN, BSN, CCM [employer], Population Health Case Manager Phone: [phone]

## 2024-04-03 ENCOUNTER — Encounter: Admitting: Gastroenterology

## 2024-04-03 ENCOUNTER — Telehealth: Payer: Self-pay

## 2024-04-03 NOTE — Transitions of Care (Post Inpatient/ED Visit) (Signed)
   04/03/2024  Name: [name] MRN: [id number] DOB: Aug 06, 1965  Today's TOC FU Call Status: Today's TOC FU Call Status:: Unsuccessful Call (3rd Attempt) Unsuccessful Call (3rd Attempt) Date: 04/03/24  Attempted to reach the patient regarding the most recent Inpatient/ED visit.  Follow Up Plan: No further outreach attempts will be made at this time. We have been unable to contact the patient.  Agastya Meister J. Aurora Rody RN, MSN [facility], [facility] RN Care Manager Direct Dial: [phone]  Fax: [phone] Website: [email or web address]

## 2024-04-10 NOTE — Progress Notes (Signed)
 HPI- M Smoker followed for OSA, COPD, Noct Hypoxemia, Lung Nodules Tobacco Abuse, complicated by CAD/ MI/stents, HTN,  GERD, CKD3a, Depression, Cocaine abuse, ETOH, Obesity,  Hosp 8/2-8/4-  COPD exacerbation HST 08/28/13 AHI 7.7/hr, desat to 85%, body weight 220 lbs Overnight Oximetry Result 03/26/23- Frequent interruptions, but at least 36 minutes on room air (not CPAP) with O2 sat </= 88%. CT 02/07/23- ground glass nodules c/w tobacco bronchilitis  ===================================================================================================================   10/10/23- 57 yoM Smoker followed for OSA, COPD, Noct Hypoxemia, Lung Nodules Tobacco Abuse, complicated by CAD/ MI/stents, HTN,  GERD, CKD3a, Depression, Cocaine abuse, ETOH, Obesity,  -Ventolin  hfa, Spiriva , gen Symbicort / Breyna , Neb Duoneb,  Nicoderm, Flonase ,  Body weight today-261 lbs Was using CPAP auto 5-20 pending mask refit                         Consider  update chest CT for nodules Download compliance - 23%, AHI 3.2/hr Emphasized compliance goals. CXR 07/02/23 MPRESSION: Chronic bronchitic changes.  No acute cardiopulmonary process.              Discussed the use of AI scribe software for clinical note transcription with the patient, who gave verbal consent to proceed.  History of Present Illness   The patient, with a history of COPD, presents with improved breathing after starting Spiriva  and Breyna  (generic Symbicort ). He reports that he has resumed walking and exercising due to the improved respiratory function. He also uses a CPAP machine, which he reports makes him feel great upon waking, but he admits to inconsistent use due to falling asleep on the couch or staying at his mother's house.  The patient also reports a history of smoking, currently at half a pack a day, but expresses a desire to quit, especially now that his breathing has improved. He has also started gardening, which sometimes leaves him winded. He  inquires about the possibility of taking an extra inhalation of Breyna  before his afternoon dose when he is exerting himself more than usual.   Assessment and Plan:  Chronic Obstructive Pulmonary Disease (COPD) COPD well-managed with Spiriva  and Breyna . Occasional exacerbations possibly due to pollen or weather changes. Informed about Breyna  use before strenuous activities. Albuterol  prescribed for rescue use. - Continue Spiriva  and Breyna : two puffs of Spiriva  in the morning, two puffs of Breyna  in the morning and evening. - Allow extra puff of Breyna  before strenuous activities if needed, avoid excess. - Prescribe albuterol  inhaler for rescue: two puffs every six hours as needed. - Send albuterol  prescription to [facility] on [address].  Obstructive Sleep Apnea (OSA) OSA managed with CPAP. Reports improved condition with use but inconsistent adherence. Goal set for improved CPAP use. - Encourage consistent CPAP use for at least four hours, six nights a week. - Set goal to improve CPAP adherence.  Tobacco Use Disorder Currently smokes half a pack per day. Expresses desire to quit. Encouraged to reduce smoking. - Encourage reduction to a quarter pack per day. - Provide support for smoking cessation.     04/11/24- 58 yoM Smoker(1 ppd) followed for OSA, COPD, Noct Hypoxemia, Lung Nodules Tobacco Abuse, complicated by CAD/ MI/stents, HTN,  GERD, CKD3a, Depression, Cocaine abuse, ETOH, Obesity,  -Ventolin  hfa, Spiriva , gen Symbicort / Breyna , Neb Duoneb,  Nicoderm, Flonase ,  Body weight today- Was using CPAP auto 5-20 pending mask refit  Consider  update chest CT for nodules Download compliance - 3%, AHI 6.9/hr ED 9/13-9/14- COPD exacerbation. Sent out on Zpak, Breyna  160, Spiriva  He says exacerb caused by mowing dusty yard. I suggested dust ask. Says CPAP mask causing skin irritation. He has partial plate, but worth asking opinion from Oral  Appliance provider. Discussed the use of AI scribe software for clinical note transcription with the patient, who gave verbal consent to proceed. History of Present Illness  CXR 03/30/24 MPRESSION: 1. Emphysema (ICD10-J43.9). 2. Previous airway thickening has resolved.     ROS-see HPI   + = positive Constitutional:    weight loss, night sweats, fevers, chills, fatigue, lassitude. HEENT:    headaches, difficulty swallowing, tooth/dental problems, sore throat,       sneezing, itching, ear ache, nasal congestion, post nasal drip, snoring CV:    chest pain, orthopnea, PND, swelling in lower extremities, anasarca,                      dizziness, palpitations Resp:   +shortness of breath with exertion or at rest.                productive cough,   non-productive cough, coughing up of blood.              change in color of mucus.  wheezing.   Skin:    rash or lesions. GI:  No-   heartburn, indigestion, abdominal pain, nausea, vomiting, diarrhea,                 change in bowel habits, loss of appetite GU: dysuria, change in color of urine, no urgency or frequency.   flank pain. MS:   joint pain, stiffness, decreased range of motion, back pain. Neuro-     nothing unusual Psych:  change in mood or affect.  depression or anxiety.   memory loss.  OBJ- Physical Exam General- Alert, Oriented, Affect-appropriate/ intense, Distress- none acute, +overweight Skin- rash-none, lesions- none, excoriation- none Lymphadenopathy- none Head- atraumatic            Eyes- Gross vision intact, PERRLA, conjunctivae and secretions clear            Ears- Hearing, canals-normal            Nose- Clear, no-Septal dev, mucus, polyps, erosion, perforation             Throat- Mallampati II-IIII , mucosa clear , drainage- none, tonsils- atrophic Neck- flexible , trachea midline, no stridor , thyroid  nl, carotid no bruit Chest - symmetrical excursion , unlabored           Heart/CV- RRR , no murmur , no gallop  , no  rub, nl s1 s2                           - JVD- none , edema+bilateral sock-line edema, stasis changes- none, varices- none           Lung-  wheeze+trace, cough- none , dullness-none, rub- none           Chest wall-  Abd-  Br/ Gen/ Rectal- Not done, not indicated Extrem- cyanosis- none, clubbing, none, atrophy- none, strength- nl Neuro- grossly intact to observation

## 2024-04-11 ENCOUNTER — Ambulatory Visit: Admitting: Internal Medicine

## 2024-04-11 ENCOUNTER — Encounter: Payer: Self-pay | Admitting: Internal Medicine

## 2024-04-11 VITALS — BP 154/88 | HR 101 | Ht 70.0 in | Wt 263.0 lb

## 2024-04-11 DIAGNOSIS — F17210 Nicotine dependence, cigarettes, uncomplicated: Secondary | ICD-10-CM

## 2024-04-11 DIAGNOSIS — J441 Chronic obstructive pulmonary disease with (acute) exacerbation: Secondary | ICD-10-CM

## 2024-04-11 DIAGNOSIS — G4733 Obstructive sleep apnea (adult) (pediatric): Secondary | ICD-10-CM

## 2024-04-11 NOTE — Patient Instructions (Signed)
 Order- referral to Orthodontist Dr Oneil Forget, DDS- consider oral appliance for OSA  Keep using your inhalers  Please call if we can help

## 2024-04-13 ENCOUNTER — Encounter: Payer: Self-pay | Admitting: Internal Medicine

## 2024-04-13 NOTE — Assessment & Plan Note (Signed)
 Having trouble staying compliance with CPAP Plan- explore oral appliance possibility if dental condition allows.

## 2024-04-13 NOTE — Assessment & Plan Note (Signed)
 Back to baseline after environmental lawn-care dust trigger Plan- Continue Breyna  maintenance inhaler. Medication talk.

## 2024-04-16 ENCOUNTER — Encounter: Admitting: Nurse Practitioner

## 2024-04-16 NOTE — Progress Notes (Deleted)
   Established Patient Office Visit  Subjective   Patient ID: [name], male    DOB: September 06, 1965  Age: 58 y.o. MRN: [id number]  No chief complaint on file.   HPI  HTN: Patient currently maintained on amlodipine  10 mg daily, losartan  50 mg daily.  Patient did have a short prescription of furosemide  40 mg daily.  Patient is followed by cardiology.  Not currently on statin  COPD: Patient currently maintained on Breyna  and albuterol  as needed.  He is followed by pulmonology patient is also maintained on Spiriva   MDD: History of the same.  Not currently on any treatment  for complete physical and follow up of chronic conditions.  Immunizations: -Tetanus: Completed in 2023 -Influenza:  -Shingles: Get at local pharmacy  -Pneumonia: Completed 2025 -COVID:  Diet: Fair diet.  Exercise: No regular exercise.  Eye exam: Completes annually  Dental exam: Completes semi-annually    Colonoscopy: Completed in 01/22/2018, repeat 3 years.  Patient is overdue Lung Cancer Screening: Completed in   PSA: Due  Sleep:     {History (Optional):23778}  ROS    Objective:     There were no vitals taken for this visit. {Vitals History (Optional):23777}  Physical Exam   No results found for any visits on 04/16/24.  {Labs (Optional):23779}  The ASCVD Risk score (Arnett DK, et al., 2019) failed to calculate for the following reasons:   Risk score cannot be calculated because patient has a medical history suggesting prior/existing ASCVD    Assessment & Plan:   Problem List Items Addressed This Visit   None   No follow-ups on file.    Adina Crandall, NP

## 2024-06-03 ENCOUNTER — Encounter: Payer: Self-pay | Admitting: Pharmacist

## 2024-06-03 NOTE — Progress Notes (Signed)
 Pharmacy Quality Measure Review  This patient is appearing on a report for being at risk of failing the Controlling Blood Pressure measure this calendar year.   Last documented BP  BP Readings from Last 1 Encounters:  04/11/24 (!) 154/88   does not meet criteria for measure closure (BP <140/90).   SUPD does not meet criteria for measure closure (BP <140/90). Statin not prescribed  2025 PCP f/u scheduled: NO  Msgd scheduling pool.Last PCP encounter = No Show. Never rescheduled.

## 2024-06-11 NOTE — Progress Notes (Signed)
 Attempted to call v[facility] not set up

## 2024-06-15 ENCOUNTER — Other Ambulatory Visit: Payer: Self-pay | Admitting: Internal Medicine

## 2024-06-17 ENCOUNTER — Other Ambulatory Visit: Payer: Self-pay | Admitting: Internal Medicine

## 2024-06-17 NOTE — Telephone Encounter (Signed)
 Pharmacy sent refill request for Spiriva .  Patient's last OV 04/11/2024 with Dr. Neysa.  Per chart note:  Continue Spiriva   Return in about 6 months (around 10/09/2024).   Will okay refill x 4.

## 2024-06-19 ENCOUNTER — Other Ambulatory Visit: Payer: Self-pay | Admitting: Internal Medicine

## 2024-06-19 NOTE — Telephone Encounter (Signed)
 Please contact pharmacy to find out what formulary alternatives are for Incruse.  Thank you.

## 2024-06-19 NOTE — Telephone Encounter (Signed)
 Copied from CRM [id number]. Topic: Clinical - Medication Refill >> Jun 19, 2024  1:07 PM Devaughn S wrote: Medication: Tiotropium Bromide (SPIRIVA  RESPIMAT) 2.5 MCG/ACT AERS  Has the patient contacted their pharmacy? Yes (Agent: If no, request that the patient contact the pharmacy for the refill. If patient does not wish to contact the pharmacy document the reason why and proceed with request.) (Agent: If yes, when and what did the pharmacy advise?)  This is the patient's preferred pharmacy:  [phone] [facility] [place] - [address] [place] [address] Phone: [phone] Fax: [phone]  Is this the correct pharmacy for this prescription? Yes If no, delete pharmacy and type the correct one.   Has the prescription been filled recently? No  Is the patient out of the medication? No, 2 days left  Has the patient been seen for an appointment in the last year OR does the patient have an upcoming appointment? Yes  Can we respond through MyChart? No  Agent: Please be advised that Rx refills may take up to 3 business days. We ask that you follow-up with your pharmacy.

## 2024-06-20 ENCOUNTER — Other Ambulatory Visit (HOSPITAL_COMMUNITY): Payer: Self-pay

## 2024-06-20 ENCOUNTER — Telehealth: Payer: Self-pay | Admitting: *Deleted

## 2024-06-20 ENCOUNTER — Telehealth: Payer: Self-pay

## 2024-06-20 NOTE — Telephone Encounter (Signed)
*  Pulm  Pharmacy Patient Advocate Encounter   Received notification from RX Request Messages that prior authorization for Spiriva  Respimat is required/requested.   Insurance verification completed.   The patient is insured through [facility].   Per test claim:  Incruse Ellipta  is preferred by the insurance.  If suggested medication is appropriate, Please send in a new RX and discontinue this one. If not, please advise as to why it's not appropriate so that we may request a Prior Authorization. Please note, some preferred medications may still require a PA.  If the suggested medications have not been trialed and there are no contraindications to their use, the PA will not be submitted, as it will not be approved.   Spiriva  is the originally prescribed medication, preferred alternative that pharmacy is asking to change to is the Incruse Ellipta .  Copay for Incruse is showing as $89.16 due to a deductible the patient has to meet.

## 2024-06-20 NOTE — Telephone Encounter (Signed)
 Copied from CRM [id number]. Topic: Clinical - Medication Prior Auth >> Jun 18, 2024  2:25 PM Devaughn RAMAN wrote: Reason for CRM: Mitzie with [employer] called and stated pt's Spiriva  medication requires prior authorization and to call [phone] to continue with Tiotropium Bromide (SPIRIVA  RESPIMAT) 2.5 MCG/ACT AERS medication.  DUPLICATE.

## 2024-06-25 NOTE — Progress Notes (Deleted)
 Cardiology Office Note   Date:  06/25/2024  ID:  [name], DOB 1965/12/28, MRN [id number] PCP: Wendee Lynwood HERO, NP  [facility] Providers Cardiologist:  Oneil Parchment, MD    History of Present Illness [name] is a 58 y.o. male with a past medical history of coronary artery disease,  bronchitis, chronic foot injury who presents for follow-up appointment.  Was last seen in August 2024 by Dr. Parchment.  He reported at that time that he had stopped taking most of his medications including Crestor  due to PND, making energy.  At that time he was feeling terrible anxious when on previous medication regimen.  He since stopped these medications he reported feeling like a new person has regained his energy motivation.  He continues to take aspirin , amlodipine , and his inhaler.  Also discussed chronic foot pain which is a result of a fall from a tree in 2013 that crushed his heel.  Despite phonophoresis the plantar persists and the only remaining treatment function was amputation which the patient is reluctant to pursue.  Reports managing his pain distances he can and is hopeful for his condition to improve over time.  In addition to his physical health concerns he also discussed in effort to quit smoking.  He was using a patch and reportedly was doing better.  He was seen by me about 2 months ago. He presented with coronary artery disease and COPD with chest pain.  He experienced severe chest pain last night during light activity, requiring nitroglycerin  for relief. Similar episodes occur two to three times a week over the past few months. He previously had a myocardial infarction confirmed by blood work, leading to the placement of two stents.  He reports persistent fatigue and significant leg swelling for two to three months, with improvement upon leg elevation.  He manages COPD with several inhalers, and a nebulizer. He denies using Symbicort . He takes amlodipine  for blood pressure  management and has not reported issues with his current medications. Physical activity has decreased due to not feeling well.  No orthopnea, PND. Reports no palpitations.   Patient was recently in the ED for COPD exacerbation.  Worsening shortness of breath and wheezing and was exposed to dust at the time.  Had been using home inhalers breathing treatments with limited benefit.  No fevers or chest pain.  He had borderline hypoxia on room air.  Was given several rounds of nebulizers with minimal improvement.  Placed on BiPAP with significant improvement.  No pneumonia but in setting of COPD exacerbation with penicillin allergy oral doxycycline  was ordered.  I saw him 03/2024, he presents with a hx of severe COPD with recent hospitalization due to respiratory distress.  He monitors his blood pressure at home and takes amlodipine  as needed. Following a recent procedure, his blood pressure readings were significantly elevated, with systolic readings reaching 204 and diastolic readings between 110 and 125.  He experiences swelling in his feet and takes 40 mg of Lasix  daily along with potassium supplements. Despite this, he continues to have swelling. He is 'a little bit winded' after walking half a mile. No chest pain. No recent fever or other acute symptoms.  He has a long history of smoking since age 82 and has reduced his smoking from two and a half to three packs a day to half a pack a day. He wants to quit smoking but has found patches, pills, and gum ineffective  Reports no chest pain, pressure, or  tightness. No edema, orthopnea, PND. Reports no palpitations.   Discussed the use of AI scribe software for clinical note transcription with the patient, who gave verbal consent to proceed.  Today, he ***   ROS: pertinent ROS in HPI  Studies Reviewed     Cardiac cath 02/28/24  Left Anterior Descending  Prox LAD lesion is 45% stenosed.    Ramus Intermedius  Non-stenotic Ramus lesion was  previously treated.    Left Circumflex  Non-stenotic Mid Cx lesion was previously treated.    Right Coronary Artery  Vessel is moderate in size.    Intervention   No interventions have been documented.   Left Heart  Left Ventricle LV end diastolic pressure is moderately elevated.   Coronary Diagrams  Diagnostic Dominance: Left   IMPRESSIONS     1. Left ventricular ejection fraction, by estimation, is 60 to 65%. The  left ventricle has normal function. The left ventricle has no regional  wall motion abnormalities. Left ventricular diastolic parameters were  normal. The average left ventricular  global longitudinal strain is -22.5 %. The global longitudinal strain is  normal.   2. Right ventricular systolic function is normal. The right ventricular  size is normal. Tricuspid regurgitation signal is inadequate for assessing  PA pressure.   3. The mitral valve is normal in structure. No evidence of mitral valve  regurgitation. No evidence of mitral stenosis.   4. The aortic valve is tricuspid. There is mild calcification of the  aortic valve. Aortic valve regurgitation is trivial. No aortic stenosis is  present.   5. Aortic dilatation noted. There is mild dilatation of the ascending  aorta, measuring 39 mm.   6. The inferior vena cava is normal in size with <50% respiratory  variability, suggesting right atrial pressure of 8 mmHg.   FINDINGS   Left Ventricle: Left ventricular ejection fraction, by estimation, is 60  to 65%. The left ventricle has normal function. The left ventricle has no  regional wall motion abnormalities. The average left ventricular global  longitudinal strain is -22.5 %.  The global longitudinal strain is normal. The left ventricular internal  cavity size was normal in size. There is no left ventricular hypertrophy.  Left ventricular diastolic parameters were normal.   Right Ventricle: The right ventricular size is normal. No increase in  right  ventricular wall thickness. Right ventricular systolic function is  normal. Tricuspid regurgitation signal is inadequate for assessing PA  pressure.   Left Atrium: Left atrial size was normal in size.   Right Atrium: Right atrial size was normal in size.   Pericardium: There is no evidence of pericardial effusion.   Mitral Valve: The mitral valve is normal in structure. No evidence of  mitral valve regurgitation. No evidence of mitral valve stenosis.   Tricuspid Valve: The tricuspid valve is normal in structure. Tricuspid  valve regurgitation is not demonstrated.   Aortic Valve: The aortic valve is tricuspid. There is mild calcification  of the aortic valve. Aortic valve regurgitation is trivial. No aortic  stenosis is present.   Pulmonic Valve: The pulmonic valve was normal in structure. Pulmonic valve  regurgitation is not visualized.   Aorta: Aortic dilatation noted. There is mild dilatation of the ascending  aorta, measuring 39 mm.   Venous: The inferior vena cava is normal in size with less than 50%  respiratory variability, suggesting right atrial pressure of 8 mmHg.   IAS/Shunts: No atrial level shunt detected by color flow Doppler.  Physical Exam VS:  There were no vitals taken for this visit.       Wt Readings from Last 3 Encounters:  04/11/24 263 lb (119.3 kg)  04/01/24 263 lb (119.3 kg)  03/30/24 268 lb 15.4 oz (122 kg)    GEN: Well nourished, well developed in no acute distress NECK: No JVD; No carotid bruits CARDIAC: RRR, no murmurs, rubs, gallops RESPIRATORY:  Clear to auscultation without rales, wheezing or rhonchi  ABDOMEN: Soft, non-tender, non-distended EXTREMITIES:  2+ pitting bilateral edema; No deformity   ASSESSMENT AND PLAN   Chronic obstructive pulmonary disease (COPD) with recent exacerbation Recent exacerbation likely due to dust exposure, improved with prednisone , antibiotics, and nebulizer. - Continue prednisone  for 4 more days. -  Continue antibiotics for 2 more days. - Use nebulizer treatments as needed. - Follow up with pulmonary doctor next week.  Tobacco use disorder Long-standing tobacco use, desires to quit, previous cessation attempts unsuccessful. - Refer to child psychotherapist for smoking cessation resources.  Atherosclerotic heart disease of native coronary artery with minimal LAD stenosis Coronary angiography shows 45% LAD stenosis, no intervention needed, focus on LDL control. - Obtain lipid panel to assess current cholesterol levels. - Maintain LDL levels within target range.  Hyperlipidemia Last LDL at 91 mg/dL, lipid panel update needed. - Order lipid panel at next primary care appointment. - Send results to cardiologist for review.  Essential hypertension Managed with amlodipine , recent BP elevation possibly due to prednisone . - Take amlodipine  daily unless systolic BP is below 110 mmHg. - Monitor blood pressure at home and report consistently high readings.  Lower extremity edema Persistent edema, not due to heart failure, possibly from prednisone . - Wear compression socks to manage edema. - Re-evaluate edema after completing prednisone  course.  Dispo: Follow-up in three months with me or MD  Signed, Justin LOISE Fabry, PA-C

## 2024-06-28 ENCOUNTER — Ambulatory Visit: Admitting: Physician Assistant

## 2024-06-28 ENCOUNTER — Ambulatory Visit: Attending: Physician Assistant | Admitting: Physician Assistant

## 2024-06-28 DIAGNOSIS — I25118 Atherosclerotic heart disease of native coronary artery with other forms of angina pectoris: Secondary | ICD-10-CM

## 2024-06-28 DIAGNOSIS — E785 Hyperlipidemia, unspecified: Secondary | ICD-10-CM

## 2024-06-28 DIAGNOSIS — R0602 Shortness of breath: Secondary | ICD-10-CM

## 2024-06-28 DIAGNOSIS — Z720 Tobacco use: Secondary | ICD-10-CM

## 2024-06-28 DIAGNOSIS — R0609 Other forms of dyspnea: Secondary | ICD-10-CM

## 2024-06-28 DIAGNOSIS — I10 Essential (primary) hypertension: Secondary | ICD-10-CM

## 2024-06-28 DIAGNOSIS — R600 Localized edema: Secondary | ICD-10-CM

## 2024-06-28 NOTE — Progress Notes (Signed)
 [name]                                          MRN: [id number]   06/28/2024   The VBCI Quality Team Specialist reviewed this patient medical record for the purposes of chart review for care gap closure. The following were reviewed: chart review for care gap closure-controlling blood pressure.    VBCI Quality Team

## 2024-07-10 NOTE — Telephone Encounter (Signed)
 Spiriva  is the originally prescribed medication, preferred alternative that pharmacy is asking to change to is the Incruse Ellipta . Copay for Incruse is showing as $89.16 due to a deductible the patient has to meet.   Will call the pharmacy.

## 2024-07-10 NOTE — Telephone Encounter (Signed)
 Dr Neysa, please advise on change from Spiriva .  The formulary alternative is Incruse Ellipta . Thank you.

## 2024-07-10 NOTE — Telephone Encounter (Signed)
 Previously ordered Spiriva  is not covered by insurance. At pharmacy request, script was changed to Incruse, 1 inhalation daily.

## 2024-07-15 ENCOUNTER — Ambulatory Visit: Payer: Self-pay | Admitting: Internal Medicine

## 2024-07-15 ENCOUNTER — Encounter: Payer: Self-pay | Admitting: Internal Medicine

## 2024-07-15 NOTE — Telephone Encounter (Signed)
 Message sent to Dr. Neysa regarding alternative inhaler in 12/4 encounter.

## 2024-07-15 NOTE — Telephone Encounter (Signed)
Please advise Dr. Annamaria Boots.

## 2024-07-15 NOTE — Telephone Encounter (Signed)
 FYI Only or Action Required?: Action required by provider: Update on Prior Auth for Spiriva  Respimat .  Patient is followed in Pulmonology for OSA, last seen on 04/11/2024 by Justin Reggy BIRCH, MD.  Called Nurse Triage reporting Shortness of Breath.    Triage Disposition: Call PCP Now  Patient/caregiver understands and will follow disposition?: Yes       Copied from CRM #[id number]. Topic: Clinical - Red Word Triage >> Jul 15, 2024 12:36 PM [name] wrote: Red Word that prompted transfer to Nurse Triage: Patient [phone] wants to speak with the office regarding prescription Spiriva  Respimat, out of medication for about a month and no one called patient back. Patient is very upset. Patient is having symptoms, cannot breath, hard to catch his breath, wheezing, no dizziness, nor pain. Patient sees Dr. Neysa. Please advise. Reason for Disposition  [1] Caller requests to speak ONLY to PCP AND [2] URGENT question  Answer Assessment - Initial Assessment Questions 1. REASON FOR CALL or QUESTION: What is your reason for calling today? or How can I best   Prior shara is pending for the Spiriva  Respimat, he has been out of the medication for one month he is wanting an update on the status now. Patient is upset he has not heard from the office. He stated Dr. Neysa used to fill out the paperwork and fax it to [employer], and he'd have the medication next day.  Protocols used: PCP Call - No Triage-A-AH

## 2024-07-16 ENCOUNTER — Ambulatory Visit: Payer: Self-pay | Admitting: Internal Medicine

## 2024-07-16 MED ORDER — INCRUSE ELLIPTA 62.5 MCG/ACT IN AEPB: 1.0000 | INHALATION_SPRAY | Freq: Every day | RESPIRATORY_TRACT | 12 refills | Status: DC

## 2024-07-16 NOTE — Telephone Encounter (Signed)
 Handled in 12/29 encounter. NFN

## 2024-07-16 NOTE — Telephone Encounter (Signed)
 His insurance will cover Incruse, which medically works the same as Spiriva . He needs to try Incruse, 1 inhalation once daily.

## 2024-07-16 NOTE — Addendum Note (Signed)
 Addended byBETHA FRIES, Abdon Petrosky A on: 07/16/2024 04:42 PM   Modules accepted: Orders

## 2024-07-16 NOTE — Telephone Encounter (Signed)
 Rx sent to pharmacy in 12/29 encounter. NFN

## 2024-07-16 NOTE — Telephone Encounter (Signed)
 Pt calling upset as he has not had his inhaler for a month and is requesting an update. I advised the pt we are waiting for Dr. Neysa to approve an alternative for Spiriva   Please advise, thank you!

## 2024-07-16 NOTE — Telephone Encounter (Signed)
 Duplicate- see phone note 06/20/24.

## 2024-07-16 NOTE — Telephone Encounter (Signed)
 FYI Only or Action Required?: Action required by provider: clinical question for provider, update on patient condition, and request for documentation or forms.  Patient is followed in Pulmonology for COPD, last seen on 04/11/2024 by Neysa Reggy BIRCH, MD.  Called Nurse Triage reporting Shortness of Breath.  Symptoms began several weeks ago.  Interventions attempted: Maintenance inhaler and Nebulizer treatments.  Symptoms are: gradually worsening.  Triage Disposition: Call PCP Now  Patient/caregiver understands and will follow disposition?: Yes   Copied from CRM #[id number]. Topic: Clinical - Red Word Triage >> Jul 16, 2024  9:56 AM Benton O wrote: [facility] that prompted transfer to Nurse Triage: patient having breathing issues this morning and  need to speak with a triage nurse and it has to do with my sprivia medication and insurance authorization and they want me to talk with someone in office been a ongoing matter for about a month cant afford 625 for this medication he always has to send a authorization     Reason for Disposition  [1] Caller requests to speak ONLY to PCP AND [2] URGENT question  Answer Assessment - Initial Assessment Questions 1. REASON FOR CALL or QUESTION: What is your reason for calling today? or How can I best     Pt called in to f/u on Sprivia PA. Pt states he visited office yesterday and was told by office that he had a new medication sent to pharmacy 12/24. Pt reports Incruse was $640 and he is unable to afford rx. Pt contacted insurance to see what he could do about speeding up PA process and was informed to request new PA be submitted. Insurance told pt to have Dr. Neysa submit a new PA specifically for 2026 Sprivia as well as Breyna . Pt states he is currently using Breyna  and has ability to do home nebulizer tx. Pt states that nothing works as well as Sprivia. Pt states he is wheezing this morning, pt is not gasping to speak to NT and denies distress.  Reassured him I would send additional message to provider.  Protocols used: PCP Call - No Triage-A-AH

## 2024-07-16 NOTE — Telephone Encounter (Signed)
 Spoke with Dr. Neysa and pts insurance prefers Incruse. Dr. Neysa would like pt to try this inhaler and if he does not like this inhaler he can come back with NP and/or new provider to find a new alternative.  Pt verbalized understanding and is aware rx sent to pharmacy. NFN

## 2024-07-16 NOTE — Telephone Encounter (Signed)
 Copied from CRM #[id number]. Topic: Clinical - Medication Question >> Jul 16, 2024  9:58 AM Essie A wrote: Reason for CRM: Darice from [facility] called regarding patient's prescription for Spiriva .  She disconnected while I was calling the office since she said she wanted to speak with someone.   Awaiting Dr. Neysa response.

## 2024-07-16 NOTE — Telephone Encounter (Signed)
 NFN

## 2024-07-26 ENCOUNTER — Other Ambulatory Visit: Payer: Self-pay | Admitting: Pulmonary Disease

## 2024-07-26 ENCOUNTER — Telehealth: Payer: Self-pay

## 2024-07-26 MED ORDER — UMECLIDINIUM BROMIDE 62.5 MCG/ACT IN AEPB: 1.0000 | INHALATION_SPRAY | Freq: Every day | RESPIRATORY_TRACT | 5 refills | Status: DC

## 2024-07-26 NOTE — Telephone Encounter (Signed)
 Received message from [facility] for alternative for Spiriva .   Preferred alternatives are Atrovent HFA and Incruse Ellipta .  Dr. Theodoro and Dr. Olena are unavailable.  Please advise of alternative Dr. Neda

## 2024-07-26 NOTE — Progress Notes (Signed)
 Placed order for Incruse

## 2024-07-30 ENCOUNTER — Telehealth: Payer: Self-pay | Admitting: *Deleted

## 2024-07-30 NOTE — Telephone Encounter (Signed)
 This is a former patient of Dr. Neysa.  [facility] that Incruse Ellipta  has been denied.  On the formulary are the following: Breo ellipta  Dulera Trelegy Ellipta  Fluticasone -salmeterol 100 mcg/50 mcg diskus Fluticasone -salmeterol 250 mcg-50 mcg diskus Fluticasone -salmeterol 100 mcg-50 mcg diskus Wixela inhub Busesonide-formoterol  fumarate inhaler Breztri  Fluticasone -salmeterol 45 mcg-21 mcg  Fluticasone -salmeterol 115 mcg-21 mcg Fluticasone -salmeterol 230 mcg-21 mcg Fluticaseone-salmeterol 500 mcg-50 mcg   Please advise.  Thank you

## 2024-07-31 ENCOUNTER — Other Ambulatory Visit: Payer: Self-pay | Admitting: Pulmonary Disease

## 2024-07-31 DIAGNOSIS — J441 Chronic obstructive pulmonary disease with (acute) exacerbation: Secondary | ICD-10-CM

## 2024-07-31 DIAGNOSIS — J42 Unspecified chronic bronchitis: Secondary | ICD-10-CM

## 2024-07-31 MED ORDER — BREZTRI AEROSPHERE 160-9-4.8 MCG/ACT IN AERO: 2.0000 | INHALATION_SPRAY | Freq: Two times a day (BID) | RESPIRATORY_TRACT | 3 refills | Status: AC

## 2024-07-31 NOTE — Progress Notes (Signed)
 Spiriva  not covered by insurance. Incruze not covered by insurance.  Patient also on budesonide -formoterol , so I have discontinued that and ordered him for Breztri  instead to consolidate inhalers into a single inhaler. Breztri  was confirmed by pharmacy to be on his insurance's formulary.

## 2024-08-01 NOTE — Telephone Encounter (Signed)
 Patient aware.NFN

## 2024-08-02 ENCOUNTER — Telehealth: Payer: Self-pay

## 2024-08-02 NOTE — Telephone Encounter (Signed)
ATC patient x1.  No answer.  No VM. ?

## 2024-08-02 NOTE — Telephone Encounter (Signed)
 Copied from CRM #[id number]. Topic: Clinical - Prescription Issue >> Aug 01, 2024  3:47 PM Rilla B wrote: Reason for CRM: Patient very upset. Wants to speak with [facility] or Dr Olena. Just got off the phone with insurance and needs to speak to someone right now regarding medication. Please call patient @ [phone]. >> Aug 02, 2024  1:02 PM Corean SAUNDERS wrote: Patient calling to state couldn't answer [facility] call as he was speaking with the insurance company but that during his appointment with Almarie Ferrari on 1/22 the provider will need to speak with insurance company directly during the appointment.  >> Aug 02, 2024 10:46 AM Joesph PARAS wrote: Patient is calling upset. Patient has not gotten a call back from [place]. Patient is upset that inhalers have been changed by a provider he has never seen. Patient is upset that Breztri  has a copayment of $700 he cannot afford and that he was prescribed Breztri  after failing it with Dr. Neysa previously.   Patient is demanding a call back from [place] right this moment to discuss.   Patient is OK with trying Breztri  but he will need to cost to drop drastically, as he is on a fixed income. If not possible to drop the price, patient would like to switched to an actually covered alternatives.      I called and spoke to pt. Pt states his insurance wants him to go back to Spiriva  or Breyna  as it is covered by insurance now. Pt states his insurance company is needing to speak with Dr Olena. Pt states his insurance company will speak to Dr Olena during the appt as they will call during his appt. I have canceled his appt with Almarie Ferrari, NP for 08-08-24 as she is not the prescribing provider and pt has been rescheduled to see Dr Olena on 08-31-23. Pt states he still has his Breztri  with him but I advised him to let us  know if he runs out, and we could provide a sample to continue the breztri  until further notice with Dr Olena. Pt verbalized understanding.  NFN

## 2024-08-02 NOTE — Telephone Encounter (Signed)
 Copied from CRM #[id number]. Topic: Clinical - Prescription Issue >> Aug 01, 2024  3:47 PM Rilla B wrote: Reason for CRM: Patient very upset. Wants to speak with [facility] or Dr Olena. Just got off the phone with insurance and needs to speak to someone right now regarding medication. Please call patient @ [phone]. >> Aug 02, 2024 10:46 AM Joesph PARAS wrote: Patient is calling upset. Patient has not gotten a call back from [place]. Patient is upset that inhalers have been changed by a provider he has never seen. Patient is upset that Breztri  has a copayment of $700 he cannot afford and that he was prescribed Breztri  after failing it with Dr. Neysa previously.   Patient is demanding a call back from [place] right this moment to discuss.   Patient is OK with trying Breztri  but he will need to cost to drop drastically, as he is on a fixed income. If not possible to drop the price, patient would like to switched to an actually covered alternatives.    ----------------------------------------------------------------------------------------------------------------------------------------------  Lauraine, Patient states he has used Breztri  previously and failed with it and it is $700.  Please advise on inhaler from inhalers on list previously sent.  Thank you.

## 2024-08-06 ENCOUNTER — Ambulatory Visit: Payer: Self-pay

## 2024-08-06 NOTE — Telephone Encounter (Signed)
 noted

## 2024-08-06 NOTE — Telephone Encounter (Signed)
 FYI Only or Action Required?: FYI only for provider: appointment scheduled on 1/21.  Patient was last seen in primary care on 03/15/2024 by Rilla Baller, MD.  Called Nurse Triage reporting Headache.  Symptoms began several months ago.  Interventions attempted: Nothing.  Symptoms are: unchanged.  Triage Disposition: See PCP When Office is Open (Within 3 Days)  Patient/caregiver understands and will follow disposition?: Yes              Message from [facility] sent at 08/06/2024  9:31 AM EST  Reason for Triage: Symptoms, two places middle of head- headaches, stiff neck - cannot turn head  spells about four or five times last week start with light headed spells and then affects left side of body- arm would stop working and left leg would stop working   Reason for Disposition  [1] MILD-MODERATE headache AND [2] present > 3 days (72 hours) AND [3] no improvement after using Care Advice  Answer Assessment - Initial Assessment Questions 1. LOCATION: Where does it hurt?      Back of head   2. ONSET: When did the headache start? (e.g., minutes, hours, days)      X 2 months   3. PATTERN: Does the pain come and go, or has it been constant since it started?     Intermittent   4. SEVERITY: How bad is the pain? and What does it keep you from doing?  (e.g., Scale 1-10; mild, moderate, or severe)     Mild 2/10  5. RECURRENT SYMPTOM: Have you ever had headaches before? If Yes, ask: When was the last time? and What happened that time?      No   6. CAUSE: What do you think is causing the headache?     Suspected how he was sleeping, now unsure   7. MIGRAINE: Have you been diagnosed with migraine headaches? If Yes, ask: Is this headache similar?      No   8. HEAD INJURY: Has there been any recent injury to your head?      No   9. OTHER SYMPTOMS: Do you have any other symptoms? (e.g., fever, stiff neck, eye pain, sore throat, cold symptoms)      X  3 weeks ago he reported spells of lightheadedness, not current during triage call. No other symptoms noted.     Patient called in to triage with complaints of headache in the back of head. It states theres 2 places where there is a dull ache.  This has been ongoing for x 2 months.  The patient stated it is hard to turn his head.  For home care, the patient is not taking any OTC medication.  Appointment scheduled for further evaluation; Patient agrees with the plan of care, and will reach out if symptoms worsen or persist.  Protocols used: [facility]

## 2024-08-06 NOTE — Telephone Encounter (Signed)
 Will see patient then Agree with ER and UC precautions

## 2024-08-07 ENCOUNTER — Encounter: Payer: Self-pay | Admitting: Neurology

## 2024-08-07 ENCOUNTER — Other Ambulatory Visit: Payer: Self-pay | Admitting: *Deleted

## 2024-08-07 ENCOUNTER — Ambulatory Visit: Attending: Cardiology | Admitting: Cardiology

## 2024-08-07 ENCOUNTER — Ambulatory Visit (INDEPENDENT_AMBULATORY_CARE_PROVIDER_SITE_OTHER): Admitting: Family Medicine

## 2024-08-07 ENCOUNTER — Encounter: Payer: Self-pay | Admitting: Family Medicine

## 2024-08-07 ENCOUNTER — Other Ambulatory Visit (HOSPITAL_COMMUNITY): Payer: Self-pay

## 2024-08-07 ENCOUNTER — Encounter: Payer: Self-pay | Admitting: Cardiology

## 2024-08-07 VITALS — BP 162/100 | HR 100 | Ht 70.0 in | Wt 251.2 lb

## 2024-08-07 DIAGNOSIS — Z720 Tobacco use: Secondary | ICD-10-CM | POA: Diagnosis not present

## 2024-08-07 DIAGNOSIS — G459 Transient cerebral ischemic attack, unspecified: Secondary | ICD-10-CM | POA: Diagnosis not present

## 2024-08-07 DIAGNOSIS — I25118 Atherosclerotic heart disease of native coronary artery with other forms of angina pectoris: Secondary | ICD-10-CM | POA: Diagnosis not present

## 2024-08-07 DIAGNOSIS — J42 Unspecified chronic bronchitis: Secondary | ICD-10-CM

## 2024-08-07 DIAGNOSIS — I10 Essential (primary) hypertension: Secondary | ICD-10-CM

## 2024-08-07 MED ORDER — ROSUVASTATIN CALCIUM 10 MG PO TABS
10.0000 mg | ORAL_TABLET | Freq: Every day | ORAL | 3 refills | Status: AC
  Filled 2024-08-07: qty 90, 90d supply, fill #0

## 2024-08-07 MED ORDER — LOSARTAN POTASSIUM 50 MG PO TABS
50.0000 mg | ORAL_TABLET | Freq: Every day | ORAL | 3 refills | Status: AC
  Filled 2024-08-07: qty 90, 90d supply, fill #0
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
## 2024-08-07 NOTE — Progress Notes (Signed)
 " Cardiology Office Note:  .   Date:  08/07/2024  ID:  [name], DOB Jul 10, 1966, MRN [id number] PCP: Wendee Lynwood HERO, NP  [facility] Providers Cardiologist:  Oneil Parchment, MD    History of Present Illness: .   [name] is a 59 y.o. male Discussed the use of AI scribe   History of Present Illness [name] is a 59 year old male with coronary artery disease, hyperlipidemia, and hypertension who presents with episodes of transient weakness and confusion.  He has been experiencing episodes of transient weakness and confusion for the past three weeks. Approximately three weeks ago, while at his shop, he became lightheaded and experienced unilateral weakness, rendering him unable to move that side. This episode lasted about an hour and resolved spontaneously. He has had three or four similar episodes since then, during which he also experienced confusion and memory difficulties.  He has a history of coronary artery disease with 45% stenosis in the LAD, hyperlipidemia, and hypertension. He underwent cardiac catheterization in 2025, and an echocardiogram showed an ejection fraction of 65%. He reports currently taking amlodipine  10 mg and aspirin .  He reports persistent lower extremity edema. He has COPD with prior exacerbations but states that his current inhalers are effective in managing his symptoms, and he has not required hospitalization recently.  He mentions experiencing persistent neck pain and tightness, which he initially thought was a 'crick in the neck.' The pain is not severe but is constant and limits his ability to turn his head. The pain radiates down his neck.  He has a history of smoking, though he reports reducing his smoking recently.      Studies Reviewed: .        Results Diagnostic Echocardiogram: Left ventricular ejection fraction 65% Cardiac catheterization (2025): 45% stenosis in the left anterior descending artery, no intervention performed Risk  Assessment/Calculations:           Physical Exam:   VS:  BP (!) 162/100   Pulse 100   Ht 5' 10 (1.778 m)   Wt 251 lb 3.2 oz (113.9 kg)   SpO2 97%   BMI 36.04 kg/m    Wt Readings from Last 3 Encounters:  08/07/24 251 lb 3.2 oz (113.9 kg)  04/11/24 263 lb (119.3 kg)  04/01/24 263 lb (119.3 kg)    GEN: Well nourished, well developed in no acute distress NECK: No JVD; No carotid bruits CARDIAC: RRR, no murmurs, no rubs, no gallops RESPIRATORY:  Clear to auscultation without rales, wheezing or rhonchi  ABDOMEN: Soft, non-tender, non-distended EXTREMITIES:  No edema; No deformity   ASSESSMENT AND PLAN: .    Assessment and Plan Assessment & Plan Transient neurological symptoms (possible transient ischemic attack) Intermittent episodes of lightheadedness and unilateral weakness lasting about an hour, occurring three to four times over the past three weeks. Associated with confusion and memory issues post-episode. Referred to neurology for further evaluation.  If symptoms arise again, please report to the emergency department.  Coronary artery disease 45% stenosis in the LAD. No intervention noted. Recent cardiac catheterization in 2025. Echocardiogram shows EF of 65%. - Continue aspirin  therapy. - Initiated rosuvastatin  (Crestor ) for plaque stabilization.  10 mg.  Repeat lipid panel in 3 months  Hypertension Blood pressure slightly elevated today. Currently on amlodipine  and aspirin . - Restarted losartan  50 to aid in blood pressure control.  Need overall better blood pressure control   Hyperlipidemia Management with rosuvastatin  (Crestor ) to stabilize plaque and  manage cholesterol levels. - Initiated rosuvastatin  (Crestor ) 10.  Chronic obstructive pulmonary disease COPD with prior exacerbations. Currently well-managed with inhalers. No recent hospitalizations reported. - Continue current inhaler regimen.  Tobacco use Continues to smoke but is reducing the amount. -  Encouraged further reduction in smoking.         Dispo: 1 yr  Signed, Oneil Parchment, MD  "

## 2024-08-07 NOTE — Patient Instructions (Signed)
 Medication Instructions:  Please restart Losartan  50 mg a day. Start Crestor  10 mg a day. Continue all other medications as listed.  *If you need a refill on your cardiac medications before your next appointment, please call your pharmacy*  You have been referred to Neurology and will be contacted to be scheduled.  Follow-Up: At [facility], you and your health needs are our priority.  As part of our continuing mission to provide you with exceptional heart care, our providers are all part of one team.  This team includes your primary Cardiologist (physician) and Advanced Practice Providers or APPs (Physician Assistants and Nurse Practitioners) who all work together to provide you with the care you need, when you need it.  Your next appointment:   1 year(s)  Provider:   Oneil Parchment, MD    We recommend signing up for the patient portal called MyChart.  Sign up information is provided on this After Visit Summary.  MyChart is used to connect with patients for Virtual Visits (Telemedicine).  Patients are able to view lab/test results, encounter notes, upcoming appointments, etc.  Non-urgent messages can be sent to your provider as well.   To learn more about what you can do with MyChart, go to [email or web address].

## 2024-08-07 NOTE — Telephone Encounter (Signed)
 Pt came in for an acute appt with PCP and asked if inhaler can be refilled. Looks like pulmonary refills med so will send refill request to their office

## 2024-08-08 ENCOUNTER — Encounter: Admitting: Primary Care

## 2024-08-09 ENCOUNTER — Other Ambulatory Visit (HOSPITAL_COMMUNITY): Payer: Self-pay

## 2024-08-09 MED ORDER — ALBUTEROL SULFATE HFA 108 (90 BASE) MCG/ACT IN AERS
2.0000 | INHALATION_SPRAY | Freq: Four times a day (QID) | RESPIRATORY_TRACT | 0 refills | Status: AC | PRN
  Filled 2024-08-09: qty 6.7, 17d supply, fill #0

## 2024-08-09 NOTE — Telephone Encounter (Addendum)
 Copied from CRM #[id number]. Topic: Clinical - Medication Question >> Aug 07, 2024  3:37 PM LaVerne A wrote: Reason for CRM: Patient wants Dr. Olena to know that the medication budesonide -glycopyrrolate -formoterol  (BREZTRI  AEROSPHERE) 160-9-4.8 does not work for him.  He said it put him in the hospital  He contacted his insurance company regarding Spiriva  and Breyna  because he has taken these before and they work for him.  Dr. Olena needs to send prior authorization to insurance company and explain why patient needs to take Spiriva .  Please let patient know the status of the prior authorization by calling [phone].    Prior [employer],  please advise on prior auth submission start for Spiriva  and Breyna .  Thank you.

## 2024-08-12 ENCOUNTER — Telehealth: Payer: Self-pay

## 2024-08-12 ENCOUNTER — Other Ambulatory Visit (HOSPITAL_COMMUNITY): Payer: Self-pay

## 2024-08-12 NOTE — Telephone Encounter (Signed)
 Prior Authorization form/request asks a question that requires your assistance. Please see the question below and advise accordingly. The PA will not be submitted until the necessary information is received.

## 2024-08-13 ENCOUNTER — Other Ambulatory Visit (HOSPITAL_COMMUNITY): Payer: Self-pay

## 2024-08-14 NOTE — Telephone Encounter (Signed)
 Called and spoke with patient.  Pt states that he has not tried a generic albuterol  inhaler.

## 2024-08-14 NOTE — Progress Notes (Signed)
 [name]                                          MRN: [id number]   08/14/2024   The VBCI Quality Team Specialist reviewed this patient medical record for the purposes of chart review for care gap closure. The following were reviewed: chart review for care gap closure-controlling blood pressure.    VBCI Quality Team

## 2024-08-14 NOTE — Telephone Encounter (Signed)
 Attempted to contact pt. No answer, no vm.   Sending pt a MyChart message.

## 2024-08-14 NOTE — Telephone Encounter (Unsigned)
 Copied from CRM [id number]. Topic: Clinical - Medication Question >> Aug 14, 2024  1:42 PM Alexandria E wrote: Reason for CRM: FYI - Patient got albuterol  prescribed by his cardiologist so he will not need PCP to fill.

## 2024-08-14 NOTE — Telephone Encounter (Signed)
 Can we call and see if [name] has tried the generic albuterol  inhaler. If he has did he do ok with it?

## 2024-08-27 ENCOUNTER — Ambulatory Visit: Admitting: Neurology

## 2024-08-28 ENCOUNTER — Encounter: Admitting: Primary Care

## 2024-08-30 ENCOUNTER — Ambulatory Visit: Admitting: Pulmonary Disease

## 2024-09-06 ENCOUNTER — Ambulatory Visit: Admitting: Physician Assistant

## 2024-09-24 ENCOUNTER — Ambulatory Visit: Payer: Self-pay | Admitting: Neurology
